# Patient Record
Sex: Female | Born: 1945
Health system: Southern US, Community
[De-identification: ages and names within clinical notes are randomized; demographics above are authoritative.]

## PROBLEM LIST (undated history)

## (undated) DIAGNOSIS — F32A Depression, unspecified: Secondary | ICD-10-CM

## (undated) DIAGNOSIS — I10 Essential (primary) hypertension: Secondary | ICD-10-CM

## (undated) DIAGNOSIS — K222 Esophageal obstruction: Secondary | ICD-10-CM

## (undated) DIAGNOSIS — F329 Major depressive disorder, single episode, unspecified: Secondary | ICD-10-CM

## (undated) DIAGNOSIS — T7840XA Allergy, unspecified, initial encounter: Secondary | ICD-10-CM

## (undated) DIAGNOSIS — F419 Anxiety disorder, unspecified: Secondary | ICD-10-CM

## (undated) DIAGNOSIS — K219 Gastro-esophageal reflux disease without esophagitis: Secondary | ICD-10-CM

## (undated) DIAGNOSIS — K449 Diaphragmatic hernia without obstruction or gangrene: Secondary | ICD-10-CM

## (undated) DIAGNOSIS — H269 Unspecified cataract: Secondary | ICD-10-CM

## (undated) DIAGNOSIS — K2270 Barrett's esophagus without dysplasia: Secondary | ICD-10-CM

## (undated) DIAGNOSIS — M1990 Unspecified osteoarthritis, unspecified site: Secondary | ICD-10-CM

## (undated) DIAGNOSIS — Z5189 Encounter for other specified aftercare: Secondary | ICD-10-CM

## (undated) DIAGNOSIS — IMO0002 Reserved for concepts with insufficient information to code with codable children: Secondary | ICD-10-CM

## (undated) DIAGNOSIS — J189 Pneumonia, unspecified organism: Secondary | ICD-10-CM

## (undated) DIAGNOSIS — M810 Age-related osteoporosis without current pathological fracture: Secondary | ICD-10-CM

## (undated) DIAGNOSIS — D689 Coagulation defect, unspecified: Secondary | ICD-10-CM

## (undated) DIAGNOSIS — K317 Polyp of stomach and duodenum: Secondary | ICD-10-CM

## (undated) DIAGNOSIS — C801 Malignant (primary) neoplasm, unspecified: Secondary | ICD-10-CM

## (undated) DIAGNOSIS — J45909 Unspecified asthma, uncomplicated: Secondary | ICD-10-CM

## (undated) DIAGNOSIS — G4730 Sleep apnea, unspecified: Secondary | ICD-10-CM

## (undated) DIAGNOSIS — R7611 Nonspecific reaction to tuberculin skin test without active tuberculosis: Secondary | ICD-10-CM

## (undated) DIAGNOSIS — K5790 Diverticulosis of intestine, part unspecified, without perforation or abscess without bleeding: Secondary | ICD-10-CM

## (undated) DIAGNOSIS — G4733 Obstructive sleep apnea (adult) (pediatric): Secondary | ICD-10-CM

## (undated) HISTORY — DX: Unspecified osteoarthritis, unspecified site: M19.90

## (undated) HISTORY — PX: FOOT NEUROMA SURGERY: SHX646

## (undated) HISTORY — DX: Nonspecific reaction to tuberculin skin test without active tuberculosis: R76.11

## (undated) HISTORY — DX: Allergy, unspecified, initial encounter: T78.40XA

## (undated) HISTORY — PX: WRIST FRACTURE SURGERY: SHX121

## (undated) HISTORY — PX: FRACTURE SURGERY: SHX138

## (undated) HISTORY — DX: Gastro-esophageal reflux disease without esophagitis: K21.9

## (undated) HISTORY — DX: Major depressive disorder, single episode, unspecified: F32.9

## (undated) HISTORY — DX: Anxiety disorder, unspecified: F41.9

## (undated) HISTORY — DX: Coagulation defect, unspecified: D68.9

## (undated) HISTORY — DX: Depression, unspecified: F32.A

## (undated) HISTORY — DX: Unspecified cataract: H26.9

## (undated) HISTORY — DX: Barrett's esophagus without dysplasia: K22.70

## (undated) HISTORY — PX: UPPER GASTROINTESTINAL ENDOSCOPY: SHX188

## (undated) HISTORY — DX: Obstructive sleep apnea (adult) (pediatric): G47.33

## (undated) HISTORY — PX: COLONOSCOPY: SHX174

## (undated) HISTORY — PX: CHOLECYSTECTOMY: SHX55

## (undated) HISTORY — DX: Sleep apnea, unspecified: G47.30

## (undated) HISTORY — DX: Unspecified asthma, uncomplicated: J45.909

## (undated) HISTORY — PX: FOOT FRACTURE SURGERY: SHX645

## (undated) HISTORY — DX: Reserved for concepts with insufficient information to code with codable children: IMO0002

## (undated) HISTORY — DX: Diaphragmatic hernia without obstruction or gangrene: K44.9

## (undated) HISTORY — DX: Age-related osteoporosis without current pathological fracture: M81.0

## (undated) HISTORY — DX: Diverticulosis of intestine, part unspecified, without perforation or abscess without bleeding: K57.90

## (undated) HISTORY — DX: Polyp of stomach and duodenum: K31.7

## (undated) HISTORY — PX: FINGER SURGERY: SHX640

## (undated) HISTORY — PX: EYE SURGERY: SHX253

## (undated) HISTORY — DX: Esophageal obstruction: K22.2

## (undated) HISTORY — PX: TONSILLECTOMY: SUR1361

## (undated) HISTORY — DX: Encounter for other specified aftercare: Z51.89

## (undated) HISTORY — DX: Morbid (severe) obesity due to excess calories: E66.01

## (undated) HISTORY — PX: HERNIA REPAIR: SHX51

## (undated) HISTORY — DX: Essential (primary) hypertension: I10

---

## 1979-08-25 HISTORY — PX: TUBAL LIGATION: SHX77

## 1998-04-26 ENCOUNTER — Ambulatory Visit (HOSPITAL_BASED_OUTPATIENT_CLINIC_OR_DEPARTMENT_OTHER): Admission: RE | Admit: 1998-04-26 | Discharge: 1998-04-26 | Payer: Self-pay | Admitting: Podiatry

## 1998-07-16 ENCOUNTER — Ambulatory Visit (HOSPITAL_COMMUNITY): Admission: RE | Admit: 1998-07-16 | Discharge: 1998-07-16 | Payer: Self-pay | Admitting: Gynecology

## 1998-07-16 ENCOUNTER — Encounter: Payer: Self-pay | Admitting: Gynecology

## 1998-07-26 ENCOUNTER — Ambulatory Visit (HOSPITAL_BASED_OUTPATIENT_CLINIC_OR_DEPARTMENT_OTHER): Admission: RE | Admit: 1998-07-26 | Discharge: 1998-07-26 | Payer: Self-pay | Admitting: Orthopedic Surgery

## 1999-03-31 ENCOUNTER — Ambulatory Visit (HOSPITAL_COMMUNITY): Admission: RE | Admit: 1999-03-31 | Discharge: 1999-03-31 | Payer: Self-pay | Admitting: Internal Medicine

## 1999-07-01 ENCOUNTER — Other Ambulatory Visit: Admission: RE | Admit: 1999-07-01 | Discharge: 1999-07-01 | Payer: Self-pay | Admitting: Obstetrics & Gynecology

## 1999-07-30 ENCOUNTER — Ambulatory Visit (HOSPITAL_COMMUNITY): Admission: RE | Admit: 1999-07-30 | Discharge: 1999-07-30 | Payer: Self-pay | Admitting: Obstetrics & Gynecology

## 1999-07-30 ENCOUNTER — Encounter: Payer: Self-pay | Admitting: Obstetrics & Gynecology

## 1999-09-15 ENCOUNTER — Encounter: Payer: Self-pay | Admitting: Obstetrics & Gynecology

## 1999-09-15 ENCOUNTER — Encounter: Admission: RE | Admit: 1999-09-15 | Discharge: 1999-09-15 | Payer: Self-pay | Admitting: Obstetrics & Gynecology

## 2000-08-04 ENCOUNTER — Ambulatory Visit (HOSPITAL_COMMUNITY): Admission: RE | Admit: 2000-08-04 | Discharge: 2000-08-04 | Payer: Self-pay | Admitting: Obstetrics & Gynecology

## 2000-08-04 ENCOUNTER — Encounter: Payer: Self-pay | Admitting: Obstetrics & Gynecology

## 2000-11-16 ENCOUNTER — Encounter: Admission: RE | Admit: 2000-11-16 | Discharge: 2000-11-16 | Payer: Self-pay | Admitting: Occupational Medicine

## 2000-11-16 ENCOUNTER — Encounter: Payer: Self-pay | Admitting: Occupational Medicine

## 2000-11-22 ENCOUNTER — Encounter: Admission: RE | Admit: 2000-11-22 | Discharge: 2000-11-22 | Payer: Self-pay | Admitting: Occupational Medicine

## 2000-11-22 ENCOUNTER — Encounter: Payer: Self-pay | Admitting: Occupational Medicine

## 2001-06-29 ENCOUNTER — Other Ambulatory Visit: Admission: RE | Admit: 2001-06-29 | Discharge: 2001-06-29 | Payer: Self-pay | Admitting: Obstetrics & Gynecology

## 2001-08-10 ENCOUNTER — Ambulatory Visit (HOSPITAL_COMMUNITY): Admission: RE | Admit: 2001-08-10 | Discharge: 2001-08-10 | Payer: Self-pay | Admitting: Obstetrics & Gynecology

## 2001-08-10 ENCOUNTER — Encounter: Payer: Self-pay | Admitting: Obstetrics & Gynecology

## 2002-03-31 ENCOUNTER — Ambulatory Visit (HOSPITAL_BASED_OUTPATIENT_CLINIC_OR_DEPARTMENT_OTHER): Admission: RE | Admit: 2002-03-31 | Discharge: 2002-03-31 | Payer: Self-pay | Admitting: Internal Medicine

## 2002-08-30 ENCOUNTER — Ambulatory Visit (HOSPITAL_COMMUNITY): Admission: RE | Admit: 2002-08-30 | Discharge: 2002-08-30 | Payer: Self-pay | Admitting: Obstetrics & Gynecology

## 2002-08-30 ENCOUNTER — Encounter: Payer: Self-pay | Admitting: Obstetrics & Gynecology

## 2002-09-08 ENCOUNTER — Ambulatory Visit (HOSPITAL_BASED_OUTPATIENT_CLINIC_OR_DEPARTMENT_OTHER): Admission: RE | Admit: 2002-09-08 | Discharge: 2002-09-08 | Payer: Self-pay | Admitting: Pulmonary Disease

## 2002-09-12 ENCOUNTER — Other Ambulatory Visit: Admission: RE | Admit: 2002-09-12 | Discharge: 2002-09-12 | Payer: Self-pay | Admitting: Obstetrics & Gynecology

## 2002-11-14 ENCOUNTER — Encounter: Payer: Self-pay | Admitting: Gastroenterology

## 2002-11-14 ENCOUNTER — Ambulatory Visit (HOSPITAL_COMMUNITY): Admission: RE | Admit: 2002-11-14 | Discharge: 2002-11-14 | Payer: Self-pay | Admitting: Gastroenterology

## 2002-11-14 ENCOUNTER — Encounter (INDEPENDENT_AMBULATORY_CARE_PROVIDER_SITE_OTHER): Payer: Self-pay | Admitting: Specialist

## 2003-09-24 ENCOUNTER — Encounter: Admission: RE | Admit: 2003-09-24 | Discharge: 2003-09-24 | Payer: Self-pay | Admitting: Obstetrics & Gynecology

## 2003-12-20 ENCOUNTER — Encounter: Payer: Self-pay | Admitting: Gastroenterology

## 2004-01-25 ENCOUNTER — Other Ambulatory Visit: Admission: RE | Admit: 2004-01-25 | Discharge: 2004-01-25 | Payer: Self-pay | Admitting: Obstetrics & Gynecology

## 2004-02-14 ENCOUNTER — Ambulatory Visit (HOSPITAL_BASED_OUTPATIENT_CLINIC_OR_DEPARTMENT_OTHER): Admission: RE | Admit: 2004-02-14 | Discharge: 2004-02-14 | Payer: Self-pay | Admitting: Surgery

## 2004-07-01 ENCOUNTER — Ambulatory Visit: Payer: Self-pay | Admitting: Psychology

## 2004-07-22 ENCOUNTER — Ambulatory Visit: Payer: Self-pay | Admitting: Psychology

## 2004-08-21 ENCOUNTER — Ambulatory Visit: Payer: Self-pay | Admitting: Psychology

## 2004-09-16 ENCOUNTER — Ambulatory Visit: Payer: Self-pay | Admitting: Gastroenterology

## 2005-03-10 ENCOUNTER — Ambulatory Visit: Payer: Self-pay | Admitting: Pulmonary Disease

## 2005-03-10 ENCOUNTER — Encounter: Admission: RE | Admit: 2005-03-10 | Discharge: 2005-03-10 | Payer: Self-pay | Admitting: Obstetrics & Gynecology

## 2005-04-15 ENCOUNTER — Ambulatory Visit: Payer: Self-pay | Admitting: Gastroenterology

## 2005-05-11 ENCOUNTER — Ambulatory Visit: Payer: Self-pay | Admitting: Gastroenterology

## 2005-05-24 DIAGNOSIS — K2270 Barrett's esophagus without dysplasia: Secondary | ICD-10-CM

## 2005-05-24 HISTORY — DX: Barrett's esophagus without dysplasia: K22.70

## 2005-05-25 ENCOUNTER — Ambulatory Visit: Payer: Self-pay | Admitting: Gastroenterology

## 2005-05-25 ENCOUNTER — Encounter (INDEPENDENT_AMBULATORY_CARE_PROVIDER_SITE_OTHER): Payer: Self-pay | Admitting: *Deleted

## 2005-06-02 ENCOUNTER — Ambulatory Visit: Payer: Self-pay | Admitting: Pulmonary Disease

## 2005-06-03 ENCOUNTER — Ambulatory Visit: Payer: Self-pay | Admitting: Psychology

## 2005-06-24 ENCOUNTER — Ambulatory Visit: Payer: Self-pay | Admitting: Psychology

## 2005-06-30 ENCOUNTER — Ambulatory Visit: Payer: Self-pay | Admitting: Gastroenterology

## 2005-07-21 ENCOUNTER — Ambulatory Visit: Payer: Self-pay | Admitting: Psychology

## 2005-08-04 ENCOUNTER — Ambulatory Visit: Payer: Self-pay | Admitting: Psychology

## 2005-08-31 ENCOUNTER — Ambulatory Visit: Payer: Self-pay | Admitting: Psychology

## 2005-09-15 ENCOUNTER — Ambulatory Visit: Payer: Self-pay | Admitting: Psychology

## 2005-12-22 ENCOUNTER — Ambulatory Visit: Payer: Self-pay | Admitting: Psychology

## 2006-04-08 ENCOUNTER — Encounter: Admission: RE | Admit: 2006-04-08 | Discharge: 2006-04-08 | Payer: Self-pay | Admitting: Obstetrics & Gynecology

## 2006-04-21 ENCOUNTER — Ambulatory Visit: Payer: Self-pay | Admitting: Internal Medicine

## 2006-05-19 ENCOUNTER — Ambulatory Visit: Payer: Self-pay | Admitting: Internal Medicine

## 2006-07-27 ENCOUNTER — Ambulatory Visit: Payer: Self-pay | Admitting: Internal Medicine

## 2006-09-27 ENCOUNTER — Ambulatory Visit: Payer: Self-pay | Admitting: Internal Medicine

## 2006-10-12 ENCOUNTER — Ambulatory Visit: Payer: Self-pay | Admitting: Psychology

## 2006-10-28 ENCOUNTER — Ambulatory Visit: Payer: Self-pay | Admitting: Internal Medicine

## 2006-11-01 ENCOUNTER — Ambulatory Visit: Payer: Self-pay | Admitting: Internal Medicine

## 2006-11-16 ENCOUNTER — Ambulatory Visit: Payer: Self-pay | Admitting: Internal Medicine

## 2006-12-08 ENCOUNTER — Ambulatory Visit: Payer: Self-pay | Admitting: Internal Medicine

## 2007-01-24 ENCOUNTER — Ambulatory Visit: Payer: Self-pay | Admitting: Internal Medicine

## 2007-01-28 ENCOUNTER — Ambulatory Visit: Payer: Self-pay | Admitting: Gastroenterology

## 2007-02-11 ENCOUNTER — Ambulatory Visit: Payer: Self-pay | Admitting: Gastroenterology

## 2007-02-11 ENCOUNTER — Encounter: Payer: Self-pay | Admitting: Gastroenterology

## 2007-05-16 ENCOUNTER — Ambulatory Visit: Payer: Self-pay | Admitting: Internal Medicine

## 2007-05-16 LAB — CONVERTED CEMR LAB
ALT: 27 units/L (ref 0–35)
Alkaline Phosphatase: 60 units/L (ref 39–117)
Calcium: 9.4 mg/dL (ref 8.4–10.5)
Eosinophils Absolute: 0.2 10*3/uL (ref 0.0–0.6)
Eosinophils Relative: 2.4 % (ref 0.0–5.0)
Glucose, Bld: 98 mg/dL (ref 70–99)
Lipase: 17 units/L (ref 11.0–59.0)
MCV: 89.6 fL (ref 78.0–100.0)
Platelets: 326 10*3/uL (ref 150–400)
Potassium: 4.1 meq/L (ref 3.5–5.1)
RBC: 4.55 M/uL (ref 3.87–5.11)
RDW: 12.7 % (ref 11.5–14.6)
Sodium: 145 meq/L (ref 135–145)
Total Protein: 6.8 g/dL (ref 6.0–8.3)
WBC: 7.6 10*3/uL (ref 4.5–10.5)

## 2007-09-16 ENCOUNTER — Telehealth (INDEPENDENT_AMBULATORY_CARE_PROVIDER_SITE_OTHER): Payer: Self-pay | Admitting: *Deleted

## 2007-10-14 ENCOUNTER — Encounter: Payer: Self-pay | Admitting: Internal Medicine

## 2007-11-03 DIAGNOSIS — K219 Gastro-esophageal reflux disease without esophagitis: Secondary | ICD-10-CM

## 2007-11-03 DIAGNOSIS — R05 Cough: Secondary | ICD-10-CM

## 2007-11-03 DIAGNOSIS — G4733 Obstructive sleep apnea (adult) (pediatric): Secondary | ICD-10-CM

## 2007-11-04 ENCOUNTER — Ambulatory Visit: Payer: Self-pay | Admitting: Internal Medicine

## 2007-11-11 LAB — CONVERTED CEMR LAB
Albumin: 4 g/dL (ref 3.5–5.2)
Basophils Absolute: 0.1 10*3/uL (ref 0.0–0.1)
Eosinophils Absolute: 0.2 10*3/uL (ref 0.0–0.6)
GFR calc non Af Amer: 109 mL/min
HCT: 40.8 % (ref 36.0–46.0)
Hemoglobin: 13.7 g/dL (ref 12.0–15.0)
MCHC: 33.4 g/dL (ref 30.0–36.0)
MCV: 90.4 fL (ref 78.0–100.0)
Monocytes Absolute: 0.8 10*3/uL — ABNORMAL HIGH (ref 0.2–0.7)
Neutro Abs: 4.1 10*3/uL (ref 1.4–7.7)
Neutrophils Relative %: 51.2 % (ref 43.0–77.0)
Potassium: 3.9 meq/L (ref 3.5–5.1)
Sodium: 141 meq/L (ref 135–145)
TSH: 1.81 microintl units/mL (ref 0.35–5.50)
Total Bilirubin: 0.6 mg/dL (ref 0.3–1.2)

## 2007-11-16 ENCOUNTER — Ambulatory Visit: Payer: Self-pay | Admitting: Pulmonary Disease

## 2007-12-01 ENCOUNTER — Ambulatory Visit (HOSPITAL_BASED_OUTPATIENT_CLINIC_OR_DEPARTMENT_OTHER): Admission: RE | Admit: 2007-12-01 | Discharge: 2007-12-01 | Payer: Self-pay | Admitting: Pulmonary Disease

## 2007-12-01 ENCOUNTER — Ambulatory Visit: Payer: Self-pay | Admitting: Pulmonary Disease

## 2008-01-22 ENCOUNTER — Encounter: Payer: Self-pay | Admitting: Pulmonary Disease

## 2008-01-23 ENCOUNTER — Telehealth (INDEPENDENT_AMBULATORY_CARE_PROVIDER_SITE_OTHER): Payer: Self-pay | Admitting: *Deleted

## 2008-01-31 DIAGNOSIS — J310 Chronic rhinitis: Secondary | ICD-10-CM | POA: Insufficient documentation

## 2008-01-31 DIAGNOSIS — K449 Diaphragmatic hernia without obstruction or gangrene: Secondary | ICD-10-CM | POA: Insufficient documentation

## 2008-02-01 ENCOUNTER — Ambulatory Visit: Payer: Self-pay | Admitting: Gastroenterology

## 2008-02-01 ENCOUNTER — Ambulatory Visit: Payer: Self-pay | Admitting: Pulmonary Disease

## 2008-02-01 DIAGNOSIS — K5730 Diverticulosis of large intestine without perforation or abscess without bleeding: Secondary | ICD-10-CM | POA: Insufficient documentation

## 2008-04-04 ENCOUNTER — Ambulatory Visit: Payer: Self-pay | Admitting: Internal Medicine

## 2008-04-04 DIAGNOSIS — R1310 Dysphagia, unspecified: Secondary | ICD-10-CM | POA: Insufficient documentation

## 2008-08-01 ENCOUNTER — Telehealth: Payer: Self-pay | Admitting: Internal Medicine

## 2008-08-03 ENCOUNTER — Ambulatory Visit: Payer: Self-pay | Admitting: Internal Medicine

## 2008-08-03 DIAGNOSIS — E7800 Pure hypercholesterolemia, unspecified: Secondary | ICD-10-CM

## 2008-08-04 LAB — CONVERTED CEMR LAB
Basophils Absolute: 0.1 10*3/uL (ref 0.0–0.1)
Basophils Relative: 0.7 % (ref 0.0–3.0)
CRP, High Sensitivity: 8 — ABNORMAL HIGH (ref 0.00–5.00)
Calcium: 9 mg/dL (ref 8.4–10.5)
Chloride: 104 meq/L (ref 96–112)
Cholesterol: 193 mg/dL (ref 0–200)
Creatinine, Ser: 0.6 mg/dL (ref 0.4–1.2)
Eosinophils Absolute: 0.2 10*3/uL (ref 0.0–0.7)
GFR calc Af Amer: 131 mL/min
GFR calc non Af Amer: 108 mL/min
HCT: 39.9 % (ref 36.0–46.0)
HDL: 34.3 mg/dL — ABNORMAL LOW (ref >39.0)
MCHC: 34.9 g/dL (ref 30.0–36.0)
MCV: 89.5 fL (ref 78.0–100.0)
Monocytes Absolute: 0.6 10*3/uL (ref 0.1–1.0)
Neutrophils Relative %: 65.8 % (ref 43.0–77.0)
RBC: 4.46 M/uL (ref 3.87–5.11)
VLDL: 17 mg/dL (ref 0–40)

## 2008-08-09 ENCOUNTER — Telehealth (INDEPENDENT_AMBULATORY_CARE_PROVIDER_SITE_OTHER): Payer: Self-pay | Admitting: *Deleted

## 2008-08-21 ENCOUNTER — Telehealth (INDEPENDENT_AMBULATORY_CARE_PROVIDER_SITE_OTHER): Payer: Self-pay | Admitting: *Deleted

## 2008-08-31 ENCOUNTER — Telehealth (INDEPENDENT_AMBULATORY_CARE_PROVIDER_SITE_OTHER): Payer: Self-pay | Admitting: *Deleted

## 2008-09-06 ENCOUNTER — Encounter: Payer: Self-pay | Admitting: Internal Medicine

## 2008-09-12 ENCOUNTER — Telehealth (INDEPENDENT_AMBULATORY_CARE_PROVIDER_SITE_OTHER): Payer: Self-pay | Admitting: *Deleted

## 2008-09-17 ENCOUNTER — Ambulatory Visit: Payer: Self-pay | Admitting: Internal Medicine

## 2008-10-02 ENCOUNTER — Ambulatory Visit: Payer: Self-pay | Admitting: Internal Medicine

## 2008-10-29 ENCOUNTER — Telehealth (INDEPENDENT_AMBULATORY_CARE_PROVIDER_SITE_OTHER): Payer: Self-pay | Admitting: *Deleted

## 2008-11-01 ENCOUNTER — Encounter: Payer: Self-pay | Admitting: Internal Medicine

## 2008-11-03 ENCOUNTER — Emergency Department (HOSPITAL_COMMUNITY): Admission: EM | Admit: 2008-11-03 | Discharge: 2008-11-04 | Payer: Self-pay | Admitting: Emergency Medicine

## 2008-11-19 ENCOUNTER — Telehealth: Payer: Self-pay | Admitting: Internal Medicine

## 2008-12-17 ENCOUNTER — Telehealth (INDEPENDENT_AMBULATORY_CARE_PROVIDER_SITE_OTHER): Payer: Self-pay | Admitting: *Deleted

## 2009-02-04 ENCOUNTER — Ambulatory Visit: Payer: Self-pay | Admitting: Gastroenterology

## 2009-02-04 DIAGNOSIS — K2270 Barrett's esophagus without dysplasia: Secondary | ICD-10-CM

## 2009-05-23 ENCOUNTER — Ambulatory Visit: Payer: Self-pay | Admitting: Internal Medicine

## 2009-05-23 LAB — CONVERTED CEMR LAB: Vit D, 25-Hydroxy: 42 ng/mL (ref 30–89)

## 2009-05-27 LAB — CONVERTED CEMR LAB
ALT: 37 units/L — ABNORMAL HIGH (ref 0–35)
AST: 29 units/L (ref 0–37)
Basophils Relative: 0.7 % (ref 0.0–3.0)
Bilirubin Urine: NEGATIVE
CRP, High Sensitivity: 10.2 — ABNORMAL HIGH (ref 0.00–5.00)
Chloride: 105 meq/L (ref 96–112)
Cholesterol: 154 mg/dL (ref 0–200)
Eosinophils Relative: 1.9 % (ref 0.0–5.0)
GFR calc non Af Amer: 90.31 mL/min (ref >60)
HCT: 39.8 % (ref 36.0–46.0)
Hemoglobin: 13.8 g/dL (ref 12.0–15.0)
LDL Cholesterol: 106 mg/dL — ABNORMAL HIGH (ref 0–99)
Leukocytes, UA: NEGATIVE
Lymphs Abs: 1.9 10*3/uL (ref 0.7–4.0)
MCV: 89.7 fL (ref 78.0–100.0)
Monocytes Absolute: 0.6 10*3/uL (ref 0.1–1.0)
Monocytes Relative: 8.2 % (ref 3.0–12.0)
Potassium: 3.4 meq/L — ABNORMAL LOW (ref 3.5–5.1)
RBC: 4.44 M/uL (ref 3.87–5.11)
Sodium: 141 meq/L (ref 135–145)
Specific Gravity, Urine: >=1.03 (ref 1.000–1.030)
TSH: 0.95 microintl units/mL (ref 0.35–5.50)
Total Bilirubin: 0.4 mg/dL (ref 0.3–1.2)
Total CHOL/HDL Ratio: 4
Total Protein: 6.7 g/dL (ref 6.0–8.3)
Urine Glucose: NEGATIVE mg/dL
VLDL: 12 mg/dL (ref 0.0–40.0)
WBC: 7.9 10*3/uL (ref 4.5–10.5)
pH: 5.5 (ref 5.0–8.0)

## 2009-06-11 ENCOUNTER — Telehealth (INDEPENDENT_AMBULATORY_CARE_PROVIDER_SITE_OTHER): Payer: Self-pay | Admitting: *Deleted

## 2009-07-11 ENCOUNTER — Ambulatory Visit: Payer: Self-pay | Admitting: Internal Medicine

## 2009-08-03 ENCOUNTER — Emergency Department (HOSPITAL_COMMUNITY): Admission: EM | Admit: 2009-08-03 | Discharge: 2009-08-03 | Payer: Self-pay | Admitting: Emergency Medicine

## 2009-08-20 ENCOUNTER — Ambulatory Visit: Payer: Self-pay | Admitting: Internal Medicine

## 2009-09-05 ENCOUNTER — Telehealth (INDEPENDENT_AMBULATORY_CARE_PROVIDER_SITE_OTHER): Payer: Self-pay | Admitting: *Deleted

## 2009-09-06 ENCOUNTER — Encounter: Payer: Self-pay | Admitting: Internal Medicine

## 2009-09-10 ENCOUNTER — Encounter: Payer: Self-pay | Admitting: Adult Health

## 2009-09-10 ENCOUNTER — Ambulatory Visit: Payer: Self-pay | Admitting: Internal Medicine

## 2009-11-05 ENCOUNTER — Ambulatory Visit: Payer: Self-pay | Admitting: Internal Medicine

## 2009-11-19 ENCOUNTER — Encounter: Payer: Self-pay | Admitting: Pulmonary Disease

## 2010-01-07 ENCOUNTER — Encounter (INDEPENDENT_AMBULATORY_CARE_PROVIDER_SITE_OTHER): Payer: Self-pay | Admitting: *Deleted

## 2010-03-06 ENCOUNTER — Encounter (INDEPENDENT_AMBULATORY_CARE_PROVIDER_SITE_OTHER): Payer: Self-pay | Admitting: *Deleted

## 2010-03-10 ENCOUNTER — Ambulatory Visit: Payer: Self-pay | Admitting: Gastroenterology

## 2010-03-12 ENCOUNTER — Telehealth (INDEPENDENT_AMBULATORY_CARE_PROVIDER_SITE_OTHER): Payer: Self-pay | Admitting: *Deleted

## 2010-03-12 ENCOUNTER — Encounter: Payer: Self-pay | Admitting: Gastroenterology

## 2010-04-15 ENCOUNTER — Telehealth: Payer: Self-pay | Admitting: Gastroenterology

## 2010-04-18 ENCOUNTER — Ambulatory Visit: Payer: Self-pay | Admitting: Gastroenterology

## 2010-04-25 ENCOUNTER — Encounter: Payer: Self-pay | Admitting: Gastroenterology

## 2010-04-29 ENCOUNTER — Ambulatory Visit: Payer: Self-pay | Admitting: Pulmonary Disease

## 2010-04-29 DIAGNOSIS — G4710 Hypersomnia, unspecified: Secondary | ICD-10-CM

## 2010-05-26 ENCOUNTER — Encounter: Payer: Self-pay | Admitting: Pulmonary Disease

## 2010-06-03 ENCOUNTER — Ambulatory Visit: Payer: Self-pay | Admitting: Internal Medicine

## 2010-06-03 ENCOUNTER — Encounter: Payer: Self-pay | Admitting: Internal Medicine

## 2010-06-03 DIAGNOSIS — E559 Vitamin D deficiency, unspecified: Secondary | ICD-10-CM

## 2010-06-03 LAB — CONVERTED CEMR LAB
ALT: 23 units/L (ref 0–35)
BUN: 14 mg/dL (ref 6–23)
Basophils Absolute: 0.1 10*3/uL (ref 0.0–0.1)
Bilirubin Urine: NEGATIVE
Bilirubin, Direct: 0.1 mg/dL (ref 0.0–0.3)
CO2: 33 meq/L — ABNORMAL HIGH (ref 19–32)
Chloride: 103 meq/L (ref 96–112)
Cholesterol: 197 mg/dL (ref 0–200)
Creatinine, Ser: 0.7 mg/dL (ref 0.4–1.2)
Eosinophils Absolute: 0.2 10*3/uL (ref 0.0–0.7)
Eosinophils Relative: 2.6 % (ref 0.0–5.0)
Glucose, Bld: 93 mg/dL (ref 70–99)
HCT: 38.6 % (ref 36.0–46.0)
LDL Cholesterol: 136 mg/dL — ABNORMAL HIGH (ref 0–99)
Leukocytes, UA: NEGATIVE
Lymphs Abs: 2.2 10*3/uL (ref 0.7–4.0)
MCHC: 34.3 g/dL (ref 30.0–36.0)
MCV: 90.5 fL (ref 78.0–100.0)
Monocytes Absolute: 0.5 10*3/uL (ref 0.1–1.0)
Neutrophils Relative %: 59.9 % (ref 43.0–77.0)
Nitrite: NEGATIVE
Platelets: 308 10*3/uL (ref 150.0–400.0)
Potassium: 4.4 meq/L (ref 3.5–5.1)
RDW: 13.4 % (ref 11.5–14.6)
TSH: 1.17 microintl units/mL (ref 0.35–5.50)
Total Bilirubin: 0.5 mg/dL (ref 0.3–1.2)
Triglycerides: 104 mg/dL (ref 0.0–149.0)
pH: 7 (ref 5.0–8.0)

## 2010-06-05 ENCOUNTER — Telehealth: Payer: Self-pay | Admitting: Internal Medicine

## 2010-06-09 LAB — CONVERTED CEMR LAB: Vit D, 25-Hydroxy: 22 ng/mL — ABNORMAL LOW (ref 30–89)

## 2010-06-30 ENCOUNTER — Encounter: Payer: Self-pay | Admitting: Internal Medicine

## 2010-07-09 ENCOUNTER — Ambulatory Visit: Payer: Self-pay | Admitting: Psychology

## 2010-07-14 ENCOUNTER — Telehealth: Payer: Self-pay | Admitting: Pulmonary Disease

## 2010-08-04 ENCOUNTER — Ambulatory Visit: Payer: Self-pay | Admitting: Psychology

## 2010-08-04 ENCOUNTER — Ambulatory Visit: Payer: Self-pay | Admitting: Internal Medicine

## 2010-08-05 ENCOUNTER — Telehealth (INDEPENDENT_AMBULATORY_CARE_PROVIDER_SITE_OTHER): Payer: Self-pay | Admitting: *Deleted

## 2010-08-07 ENCOUNTER — Telehealth: Payer: Self-pay | Admitting: Adult Health

## 2010-08-07 DIAGNOSIS — S335XXA Sprain of ligaments of lumbar spine, initial encounter: Secondary | ICD-10-CM

## 2010-08-19 ENCOUNTER — Ambulatory Visit: Payer: Self-pay | Admitting: Psychology

## 2010-09-03 ENCOUNTER — Telehealth (INDEPENDENT_AMBULATORY_CARE_PROVIDER_SITE_OTHER): Payer: Self-pay | Admitting: *Deleted

## 2010-09-03 ENCOUNTER — Encounter: Payer: Self-pay | Admitting: Internal Medicine

## 2010-09-04 ENCOUNTER — Encounter: Payer: Self-pay | Admitting: Internal Medicine

## 2010-09-10 ENCOUNTER — Ambulatory Visit
Admission: RE | Admit: 2010-09-10 | Discharge: 2010-09-10 | Payer: Self-pay | Source: Home / Self Care | Attending: Pulmonary Disease | Admitting: Pulmonary Disease

## 2010-09-16 ENCOUNTER — Telehealth (INDEPENDENT_AMBULATORY_CARE_PROVIDER_SITE_OTHER): Payer: Self-pay | Admitting: *Deleted

## 2010-09-23 NOTE — Assessment & Plan Note (Signed)
Summary: per pt call/cb   Primary Provider/Referring Provider:  Dorthey Sawyer  CC:  OSA patient that was last seen in 2009.Marland KitchenMarland Kitchenpatient says she puts the cpap on every night but wakes up in the middle of the night and it is off...she has dozed off twice while driving...sleepy and tired during the day.  History of Present Illness: 65 yo female with severe OSA using BPAP 14/9.  I last saw Ms. Souffrant in 2009.  She has been getting more trouble feeling sleepy during the day.  She has almost fallen asleep a couple of times while driving.  She is falling asleep when sitting quiet at home.  This has been getting worse for the past one or two months.  She goes to bed at 11pm after putting her BPAP mask on.  She has no trouble falling asleep.  She sleeps through the night until about 530 am on weekdays.  She will get up at 8am on weekends.  When she wakes up her BPAP mask will be off.  She is not sure how this happens.  She is using a nasal mask.  She had tried using nasal pillows also.  She will get dryness in her mouth, and sometimes this can be severe.  She does not have a chin strap.  She has been getting sinus congestion, but has been able to continue to use her BPAP.  She feels like the pressure from the machine is okay.  She has not had any recent changes in her medications.  She denies sleep walking, sleep talking, nightmares, sleep hallucinations, sleep paralysis, cataplexy, or restless legs.  She is not using anything to help her sleep or stay awake.  Her mood is okay.  Her weight fluctuates, but has been relatively stable.    Current Medications (verified): 1)  Cymbalta 60 Mg Cpep (Duloxetine Hcl) .[name].. 1 By Mouth Daily 2)  Pantoprazole Sodium 40 Mg  Tbec (Pantoprazole Sodium) .... Take One 30-60 Min Before First and Last Meals of The Day 3)  Pepcid Ac Maximum Strength 20 Mg Tabs (Famotidine) .... One At Bedtime 4)  B Complex  Tabs (B Complex Vitamins) .... Once Daily 5)  Omnaris 50  Mcg/act Susp (Ciclesonide) .... Apply Twice Daily 6)  Px Fiber 0.52 Gm Caps (Psyllium) .... Take 2 Capsules Two Times A Day 7)  Caltrate 600+d 600-400 Mg-Unit  Tabs (Calcium Carbonate-Vitamin D) .... Take 1 Tablet By Mouth Once A Day 8)  Vitamin C 500 Mg Tabs (Ascorbic Acid) .[name].. 1 Once Daily 9)  Tussin Cough 15 Mg Caps (Dextromethorphan Hbr) .... Use 2 Caps By Mouth Every 6 Hours As Needed 10)  Tramadol Hcl 50 Mg  Tabs (Tramadol Hcl) .... One To Two By Mouth Every 4-6 Hours As Needed For Pain or Cough 11)  Chlor-Trimeton 4 Mg Tabs (Chlorpheniramine Maleate) .... One Every 6 Hours As Needed For Congestion or Drainage 12)  Ibuprofen 200 Mg Tabs (Ibuprofen) .... As Directed On Bottle As Needed 13)  Advil Cold & Sinus Liqui-Gels 30-200 Mg Caps (Pseudoephedrine-Ibuprofen) .... As Directed As Needed 14)  Afrin Nasal Spray 0.05 % Soln (Oxymetazoline Hcl) .... As Directed As Needed 15)  Saline Nasal Spray 0.65 % Soln (Saline) .... Use 2 Puffs Every 4 Hours As Needed 16)  Xanax 0.25 Mg Tabs (Alprazolam) .... 1/2 Once Daily As Needed 17)  Pantoprazole Sodium 40 Mg  Tbec (Pantoprazole Sodium) .[name].. 1 Twice A Day 30 Minutes Before Meals  Allergies (verified): 1)  Epinephrine (Epinephrine)  Past  History:  Past Surgical History: Last updated: 11/05/2009 Csection x two Cholecystectomy-1985 Right foot fracture Morton neuroma left foot Tubal Ligation 1981 Right arm surgery Dec 15th 2010  Past Medical History: HIATAL HERNIA (ICD-553.3) RHINITIS, CHRONIC (ICD-472.0)     - Remote allergy testing = molds/dust only     - Sinus CT 09/06/08  mild thickending only OBSTRUCTIVE SLEEP APNEA (ICD-327.23)..................................................[name] --PSG 03/31/02 AHI 66 --Nocturnal BIPAP MORBID OBESITY (ICD-278.01)     - Target wt  =  158  for BMI < 30  POSITIVE PPD (ICD-795.5) COUGH (ICD-786.2) GERD (ICD-530.81) Barretts esophagus Esophageal Stricture EROSIVE GASTRITIS BENIGN GASTRIC FUNDIC  GLAND POLYPS DIVERTICULOSIS HEALTH MAINTENANCE........................................................Marland KitchenWert    - DT May 23, 2009     - Pneumovax 9/07    - CPX May 23, 2009  Complex med regimen -------Meds reviewed with pt education and computerized med calendar completed September 10, 2009   Vital Signs:  Patient profile:   65 year old female Height:      62 inches (157.48 cm) Weight:      234 pounds (106.36 kg) BMI:     42.95 O2 Sat:      95 % on Room air Temp:     98.2 degrees F (36.78 degrees C) oral Pulse rate:   73 / minute BP sitting:   120 / 78  (left arm) Cuff size:   large  Vitals Entered By: Michel Bickers CMA (April 29, 2010 4:15 PM)  O2 Sat at Rest %:  95 O2 Flow:  Room air CC: OSA patient that was last seen in 2009.Marland KitchenMarland Kitchenpatient says she puts the cpap on every night but wakes up in the middle of the night and it is off...she has dozed off twice while driving...sleepy and tired during the day Comments Medications reviewed with the patient. Daytime phone verified. Michel Bickers [facility]  April 29, 2010 4:25 PM   Physical Exam  General:  normal appearance, healthy appearing, and obese.   Eyes:  PERRLA and EOMI.   Nose:  clear nasal discharge, no sinus tenderness Mouth:  MP 3, no exudate Neck:  no JVD.   Lungs:  clear bilaterally to auscultation and percussion Heart:  regular rate and rhythm, S1, S2 without murmurs, rubs, gallops, or clicks Abdomen:  soft, nontender Extremities:  no clubbing, cyanosis, edema, or deformity noted Neurologic:  normal CN II-XII and strength normal.   Cervical Nodes:  no significant adenopathy Psych:  alert and cooperative; normal mood and affect; normal attention span and concentration   Impression & Recommendations:  Problem # 1:  HYPERSOMNIA (ICD-780.54) This is likely multifactorial.    She has severe sleep apnea.  She has been using BPAP, but I am not sure if she is fully compliant.  She could have also had progression  in the severity of her sleep apnea, and may need adjustment in her pressure setting.  She is only sleeping for about 5 to 6 hours during the work week.  I suspect that this is not sufficient time for her to sleep.  I have advised her to try to sleep for at least 7 to 8 hours on a regular basis.  She is on several medications which can have sedating side effects (xanax, cymbalta, chlor-trimeton).  However, her use of these medications pre-dates the onset of her worsening hypersomnia.  I think it is okay to continue these medications for now, but this may need to be re-addressed if her symptoms persist.  Problem # 2:  OBSTRUCTIVE  SLEEP APNEA (ICD-327.23) Will get a copy of her BPAP download.  Depending on the results of this will determine if she needs to have a repeat in-lab titration study.  Problem # 3:  RHINITIS, CHRONIC (ICD-472.0) She has chronic sinus disease.  This has not been much of an issue so far with her ability to tolerate BPAP.  If it does become a problem down the road she may need to consider a different mask.  Problem # 4:  PERSISTENT DISORDER INIT/MAINTAINING WAKEFULNESS (ICD-307.44) I discussed with her proper sleep hygiene and need to allow for adequeate sleep time.  Medications Added to Medication List This Visit: 1)  Cymbalta 60 Mg Cpep (Duloxetine hcl) .[name].. 1 by mouth daily 2)  B Complex Tabs (B complex vitamins) .... Once daily  Complete Medication List: 1)  Cymbalta 60 Mg Cpep (Duloxetine hcl) .[name].. 1 by mouth daily 2)  Pantoprazole Sodium 40 Mg Tbec (Pantoprazole sodium) .... Take one 30-60 min before first and last meals of the day 3)  Pepcid Ac Maximum Strength 20 Mg Tabs (Famotidine) .... One at bedtime 4)  B Complex Tabs (B complex vitamins) .... Once daily 5)  Omnaris 50 Mcg/act Susp (Ciclesonide) .... Apply twice daily 6)  Px Fiber 0.52 Gm Caps (Psyllium) .... Take 2 capsules two times a day 7)  Caltrate 600+d 600-400 Mg-unit Tabs (Calcium carbonate-vitamin d)  .... Take 1 tablet by mouth once a day 8)  Vitamin C 500 Mg Tabs (Ascorbic acid) .[name].. 1 once daily 9)  Tussin Cough 15 Mg Caps (Dextromethorphan hbr) .... Use 2 caps by mouth every 6 hours as needed 10)  Tramadol Hcl 50 Mg Tabs (Tramadol hcl) .... One to two by mouth every 4-6 hours as needed for pain or cough 11)  Chlor-trimeton 4 Mg Tabs (Chlorpheniramine maleate) .... One every 6 hours as needed for congestion or drainage 12)  Ibuprofen 200 Mg Tabs (Ibuprofen) .... As directed on bottle as needed 13)  Advil Cold & Sinus Liqui-gels 30-200 Mg Caps (Pseudoephedrine-ibuprofen) .... As directed as needed 14)  Afrin Nasal Spray 0.05 % Soln (Oxymetazoline hcl) .... As directed as needed 15)  Saline Nasal Spray 0.65 % Soln (Saline) .... Use 2 puffs every 4 hours as needed 16)  Xanax 0.25 Mg Tabs (Alprazolam) .... 1/2 once daily as needed 17)  Pantoprazole Sodium 40 Mg Tbec (Pantoprazole sodium) .[name].. 1 twice a day 30 minutes before meals  Other Orders: Est. Patient Level IV (45409) DME Referral (DME)  Patient Instructions: 1)  Try to get at least 7 to 8 hours of sleep a night 2)  Will get copy of report from BPAP machine 3)  Follow up in 4 to 6 weeks

## 2010-09-23 NOTE — Assessment & Plan Note (Signed)
Summary: Primary svc/ cpx> refer to nutrition      Primary Provider/Referring Provider:  Dorthey Sawyer  CC:  cpx fasting.  History of Present Illness: 65 yowf never smoker  with morbid obesity complicated by gastroesophageal reflux disease, and also a history of obstructive sleep apnea.  April 05, 2008 ov:  new onset sensation something stuck in her throat with  no dysphagia or fever and  more of a tickle than a pain, resolved on short term pepcid at bedtime.  September 10, 2009--Returns for follow up and med review. Last visit flare in cough, Tx w/ steroids and aggressive GERD/Rhinitis prevention, and cough control. She is much improved w/ total resolution of cough.    June 03, 2010 cpx c/o more nasal congestion since september slt tickle in throat. Pt denies any significant sore throat, dysphagia, itching, sneezing,   excess or  secretions,  fever, chills, sweats, unintended wt loss, pleuritic or exertional cp, hempoptysis, change in activity tolerance  orthopnea pnd or leg swelling. Pt also denies any obvious fluctuation in symptoms with weather or environmental change or other alleviating or aggravating factors.       Current Medications (verified): 1)  Cymbalta 60 Mg Cpep (Duloxetine Hcl) .[name].. 1 By Mouth Daily 2)  Pantoprazole Sodium 40 Mg  Tbec (Pantoprazole Sodium) .... Take One 30-60 Min Before First and Last Meals of The Day 3)  Pepcid Ac Maximum Strength 20 Mg Tabs (Famotidine) .... One At Bedtime 4)  B Complex  Tabs (B Complex Vitamins) .... Once Daily 5)  Omnaris 50 Mcg/act Susp (Ciclesonide) .... Apply Twice Daily 6)  Px Fiber 0.52 Gm Caps (Psyllium) .... Take 2 Capsules Two Times A Day 7)  Caltrate 600+d 600-400 Mg-Unit  Tabs (Calcium Carbonate-Vitamin D) .... Take 1 Tablet By Mouth Once A Day 8)  Vitamin C 500 Mg Tabs (Ascorbic Acid) .[name].. 1 Once Daily 9)  Tussin Cough 15 Mg Caps (Dextromethorphan Hbr) .... Use 2 Caps By Mouth Every 6 Hours As Needed 10)  Tramadol Hcl  50 Mg  Tabs (Tramadol Hcl) .... One To Two By Mouth Every 4-6 Hours As Needed For Pain or Cough 11)  Chlor-Trimeton 4 Mg Tabs (Chlorpheniramine Maleate) .... One Every 6 Hours As Needed For Congestion or Drainage 12)  Ibuprofen 200 Mg Tabs (Ibuprofen) .... As Directed On Bottle As Needed 13)  Advil Cold & Sinus Liqui-Gels 30-200 Mg Caps (Pseudoephedrine-Ibuprofen) .... As Directed As Needed 14)  Afrin Nasal Spray 0.05 % Soln (Oxymetazoline Hcl) .... As Directed As Needed 15)  Saline Nasal Spray 0.65 % Soln (Saline) .... Use 2 Puffs Every 4 Hours As Needed 16)  Xanax 0.25 Mg Tabs (Alprazolam) .... 1/2 Once Daily As Needed  Allergies (verified): 1)  Epinephrine (Epinephrine)  Past History:  Past Medical History: HIATAL HERNIA (ICD-553.3) with GERD      - H/o Barretts esophagus       - EGD pos HH/ stricture 04/18/10 ,  neg barrett's RHINITIS, CHRONIC (ICD-472.0)     - Remote allergy testing = molds/dust only     - Sinus CT 09/06/08  mild thickending only OBSTRUCTIVE SLEEP APNEA (ICD-327.23)..................................................[name] --PSG 03/31/02 AHI 66 --Nocturnal BIPAP MORBID OBESITY (ICD-278.01)     - Target wt  =  158  for BMI < 30  POSITIVE PPD (ICD-795.5) COUGH (ICD-786.2) DIVERTICULOSIS HEALTH MAINTENANCE........................................................Marland KitchenWert    - DT May 23, 2009     - Pneumovax 9/07    - CPX June 03, 2010    - GYN  Levoix  Complex med regimen -------Meds reviewed with pt education and computerized med calendar completed September 10, 2009   Family History: Father - CAD, ? 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  Married.  2 Kids. Never smoker,    but Husband  smokes Illicit Drug Use - no Patient does not get regular exercise.  No etoh  Vital Signs:  Patient profile:   65 year old female Height:      62 inches Weight:      232.25 pounds O2 Sat:      93 % on Room air Temp:     98.0 degrees F oral Pulse rate:   69 / minute BP sitting:   158 / 80  (left arm) Cuff size:   large  Vitals Entered By: [name] (June 03, 2010 8:43 AM)  O2 Flow:  Room air  Physical Exam  Additional Exam:  wt  224 May 23, 2009  > 226 September 10, 2009 > 229 November 05, 2009 > 232 June 03, 2010  Ambulatory unhappy appearing  wf in no acute distress. HEENT: nl dentition,  and orophanx. Nl external ear canals without cough reflex, elongated uvula, thick tongue.  Mod bilateral  turbinate edema mucoid secretions/ crusting moderate also bilateral Neck without JVD/Nodes/TM Lungs clear to A and P bilaterally without cough on insp or exp maneuvers RRR no s3 or murmur or increase in P2.  No edema Abd soft and benign with nl excursion in the supine position. No bruits or organomegaly Ext warm without calf tenderness, cyanosis clubbing Skin warm and dry without lesions   Neuro alert, nl sensorium,  no motor, cerebellar def MS  Nl gait, no joint restrictions      Vitamin D (25-Hydroxy)                        [L]  22 ng/mL                    30-89    Cholesterol               197 mg/dL                   2-440     ATP III Classification            Desirable:  < 200 mg/dL                    Borderline High:  200 - 239 mg/dL               High:  > = 240 mg/dL   Triglycerides             104.0 mg/dL                 1.0-272.5     Normal:  <150 mg/dL     Borderline High:  366 - 199 mg/dL   HDL                       44.03 mg/dL                 >47.42  VLDL Cholesterol          20.8 mg/dL                  1.6-10.9   LDL Cholesterol      [H]  604 mg/dL                   5-40  CHO/HDL Ratio:  CHD Risk                             5                    Men           Women     1/2 Average Risk     3.4          3.3     Average Risk          5.0          4.4     2X Average Risk          9.6          7.1     3X Average Risk          15.0          11.0                           Tests: (2) BMP (METABOL)   Sodium                    139 mEq/L                   135-145   Potassium                 4.4 mEq/L                   3.5-5.1   Chloride                  103 mEq/L                   96-112   Carbon Dioxide       [H]  33 mEq/L                    19-32   Glucose                   93 mg/dL                    98-11   BUN                       14 mg/dL                    9-14   Creatinine                0.7 mg/dL                   7.8-2.9   Calcium                   9.1 mg/dL                   5.6-21.3   GFR  93.07 mL/min                >60  Tests: (3) CBC Platelet w/Diff (CBCD)   White Cell Count          7.4 K/uL                    4.5-10.5   Red Cell Count            4.26 Mil/uL                 3.87-5.11   Hemoglobin                13.2 g/dL                   11.9-14.7   Hematocrit                38.6 %                      36.0-46.0   MCV                       90.5 fl                     78.0-100.0   MCHC                      34.3 g/dL                   82.9-56.2   RDW                       13.4 %                      11.5-14.6   Platelet Count            308.0 K/uL                  150.0-400.0   Neutrophil %              59.9 %                      43.0-77.0   Lymphocyte %              30.2 %                      12.0-46.0   Monocyte %                6.6 %                       3.0-12.0   Eosinophils%              2.6 %                       0.0-5.0   Basophils %               0.7 %                       0.0-3.0   Neutrophill Absolute      4.4 K/uL                    1.4-7.7   Lymphocyte Absolute  2.2 K/uL                    0.7-4.0   Monocyte Absolute         0.5 K/uL                    0.1-1.0  Eosinophils,  Absolute                             0.2 K/uL                    0.0-0.7   Basophils Absolute        0.1 K/uL                    0.0-0.1  Tests: (4) Hepatic/Liver Function Panel (HEPATIC)   Total Bilirubin           0.5 mg/dL                   1.1-9.1   Direct Bilirubin          0.1 mg/dL                   4.7-8.2   Alkaline Phosphatase      64 U/L                      39-117   AST                       20 U/L                      0-37   ALT                       23 U/L                      0-35   Total Protein             6.6 g/dL                    9.5-6.2   Albumin                   3.9 g/dL                    1.3-0.8  Tests: (5) TSH (TSH)   FastTSH                   1.17 uIU/mL                 0.35-5.50  Tests: (6) UDip Only (UDIP)   Color                     LT. YELLOW       RANGE:  Yellow;Lt. Yellow   Clarity                   CLEAR                       Clear   Specific Gravity          1.015                       1.000 -  1.030   Urine Ph                  7.0                         5.0-8.0   Protein                   NEGATIVE                    Negative   Urine Glucose             NEGATIVE                    Negative   Ketones                   NEGATIVE                    Negative   Urine Bilirubin           NEGATIVE                    Negative   Blood                     TRACE-INTACT                Negative   Urobilinogen              1.0                         0.0 - 1.0   Leukocyte Esterace        NEGATIVE                    Negative   Nitrite                   NEGATIVE                    Negative  Impression & Recommendations:  Problem # 1:  COUGH (ICD-786.2)  controlled  with max rx of GERD . Upper airway cough syndrome, so named because it's frequently impossible to sort out how much is LPR vs CR/sinusitis with freq throat clearing generating secondary extra esophageal GERD from wide swings in gastric pressure that occur with throat clearing, promoting self use of  mint and menthol lozenges that reduce the lower esophageal sphincter tone and exacerbate the problem further.  These symptoms are easily confused with asthma/copd by even experienced pulmonogists because they overlap so much. These are the same pts who not infrequently have failed to tolerate ace inhibitors,  dry powder inhalers or biphosphonates or report having reflux symptoms that don't respond to standard doses of PPI   Problem # 2:  RHINITIS, CHRONIC (ICD-472.0) I emphasized that nasal steroids have no immediate benefit in terms of improving symptoms.  To help them reached the target tissue, the patient should use Afrin two puffs every 12 hours applied one min before using the nasal steroids.  Afrin should be stopped after no more than 5 days.  If the symptoms worsen, Afrin can be restarted after 5 days off of therapy to prevent rebound congestion from overuse of Afrin.  I also emphasized that in no way are nasal steroids a concern in terms of "addiction".    Problem # 3:  UNSPECIFIED  VITAMIN D DEFICIENCY (ICD-268.9)   Vit D def, rx with 50 k twice weekly for 12 weeks, then recheck level    Problem # 4:  HYPERCHOLESTEROLEMIA (ICD-272.0)  Labs Reviewed: SGOT: 29 (05/23/2009)   SGPT: 37 (05/23/2009)   HDL:36.40 (05/23/2009), 34.3 (08/03/2008)  LDL:106 (05/23/2009), 141 (08/03/2008) > 136 June 04, 2010    Chol:154 (05/23/2009), 193 (08/03/2008)  Trig:60.0 (05/23/2009), 87 (08/03/2008)  Work on wt loss  Problem # 5:  MORBID OBESITY (ICD-278.01)  Weight control is a matter of calorie balance which needs to be tilted in the pt's favor by eating less and exercising more.  Specifically, I recommended  exercise at a level where pt  is short of breath but not out of breath 30 minutes daily.  If not losing weight on this program, I would strongly recommend pt see a nutritionist with a food diary recorded for two weeks prior to the visit.     Other Orders: Est. Patient 40-64 years  [id number]) T-Vitamin D (25-Hydroxy) [phone]) Misc. Referral (Misc. Ref) Misc. Referral (Misc. Ref) TLB-Lipid Panel (80061-LIPID) TLB-BMP (Basic Metabolic Panel-BMET) (80048-METABOL) TLB-CBC Platelet - w/Differential (85025-CBCD) TLB-Hepatic/Liver Function Pnl (80076-HEPATIC) TLB-TSH (Thyroid Stimulating Hormone) (84443-TSH) TLB-Udip ONLY (81003-UDIP) EKG w/ Interpretation (93000)  Patient Instructions: 1)  Call [phone] for your results w/in next 3 days - if there's something important  I feel you need to know,  I'll be in touch with you directly.  2)  See Patient Care Coordinator before leaving for nutrition eval at [facility] with a food diary x 2 weeks 3)  Return to office in 3 months, sooner if needed  Prescriptions: OMNARIS 50 MCG/ACT SUSP (CICLESONIDE) apply twice daily  #1 x 11   Entered and Authorized by:   Nyoka Cowden MD   Signed by:   Nyoka Cowden MD on 06/03/2010   Method used:   Electronically to        [facility]* (retail)       [address]       [place], [place]  [address]       Ph: [phone]       Fax: [phone]   RxID:   [id number]

## 2010-09-23 NOTE — Miscellaneous (Signed)
Summary: updated med list  Medications Added PX FIBER 0.52 GM CAPS (PSYLLIUM) take 2 capsules two times a day TUSSIN COUGH 15 MG CAPS (DEXTROMETHORPHAN HBR) use 2 caps by mouth every 6 hours as needed SALINE NASAL SPRAY 0.65 % SOLN (SALINE) use 2 puffs every 4 hours as needed       Clinical Lists Changes  Medications: Added new medication of PX FIBER 0.52 GM CAPS (PSYLLIUM) take 2 capsules two times a day Removed medication of * BIPAP 14/8 at bedtime Added new medication of TUSSIN COUGH 15 MG CAPS (DEXTROMETHORPHAN HBR) use 2 caps by mouth every 6 hours as needed Removed medication of XANAX 0.25 MG  TABS (ALPRAZOLAM) 1/2 tab as needed Removed medication of PREDNISONE 10 MG  TABS (PREDNISONE) 4 each am x 2days, 2x2days, 1x2days and stop Added new medication of SALINE NASAL SPRAY 0.65 % SOLN (SALINE) use 2 puffs every 4 hours as needed

## 2010-09-23 NOTE — Letter (Signed)
Summary: Endoscopy Letter  [facility]  [address] [place], [place] [address]   Phone: [phone]  Fax: [phone]      Jan 07, 2010 MRN: [id number]   [name] [address] [place], [place]  [address]   Dear [name],   According to your medical record, it is time for you to schedule an Endoscopy. Endoscopic screening is recommended for patients with certain upper digestive tract conditions because of associated increased risk for cancers of the upper digestive system.  This letter has been generated based on the recommendations made at the time of your prior procedure. If you feel that in your particular situation this may no longer apply, please contact our office.  Please call our office at [phone]) to schedule this appointment or to update your records at your earliest convenience.  Thank you for cooperating with [place] to provide you with the very best care possible.   Sincerely,  Judie Petit T. Russella Dar, M.D.  [facility] Gastroenterology Division [phone]

## 2010-09-23 NOTE — Assessment & Plan Note (Signed)
Summary: Primary care /  f/u ov    Primary Provider/Referring Provider:  Dorthey Sawyer  CC:  6 wk followup.  Pt c/o nasal congestion since this am.  No other complaints today.[name]  History of Present Illness: 65 yowf never smoker  with morbid obesity complicated by gastroesophageal reflux disease, and also a history of obstructive sleep apnea, but unable to tolerate CPAP > on BIPAP  April 05, 2008 ov:  new onset sensation something stuck in her throat with  no dysphagia or fever and  more of a tickle than a pain, resolved on short term pepcid at bedtime.  August 03, 2008 cpx with cough x 24 h no purulent sputum.  Started back on pepcid at hs. plus round of augmentin and prednisone, transiently better.  October 02, 2008 ov co worsening  cough and chest congestion x sev weeks,  minimal yellow mucus and doe, no resting sob.    Not using PPI ac as rec but restarted pepcid at bedtime at onset of cough   May 23, 2009 cpx never get completely better since onset of hot weather in early summer of 2010.  Noct nasal obstruction and cough, not using hs pepcid as prev rec.  Better when remembers afrin before nasonex.   July 11, 2009  Pt c/o congestion and runny nose on and off x 2 months.  She also c/o hoarsness and dry cough x 1 wk.   seems first thing am with nasal obstruction and sneezing. rec steroid taper and Chlortimeton   August 20, 2009 states all better for sev weeks after prednisone, similar response in past-  c/o cough since 12/15- worsening over the past 4-5 days.  Pt states that cough is dry and hacky.    September 10, 2009--Returns for follow up and med review. Last visit flare in cough, Tx w/ steroids and aggressive GERD/Rhinitis prevention, and cough control. She is much improved w/ total resolution of cough for 3 weeks.   November 05, 2009 6 wk followup.  Pt c/o nasal congestion since this am.  No other complaints. overall better in terms of cough control with ppi two times a  day and pepcid at hs.  Pt denies any significant sore throat, dysphagia, itching, sneezing,  nasal congestion or excess secretions,  fever, chills, sweats, unintended wt loss, pleuritic or exertional cp, hempoptysis, change in activity tolerance  orthopnea pnd.   Current Medications (verified): 1)  Cymbalta 30 Mg  Cpep (Duloxetine Hcl) .... Take 1 Tablet By Mouth Once A Day 2)  Pantoprazole Sodium 40 Mg  Tbec (Pantoprazole Sodium) .... Take One 30-60 Min Before First and Last Meals of The Day 3)  Pepcid Ac Maximum Strength 20 Mg Tabs (Famotidine) .... One At Bedtime 4)  Vitamin B-6 .... 1 Once Daily 5)  Omnaris 50 Mcg/act Susp (Ciclesonide) .... Apply Twice Daily 6)  Px Fiber 0.52 Gm Caps (Psyllium) .... Take 2 Capsules Two Times A Day 7)  Caltrate 600+d 600-400 Mg-Unit  Tabs (Calcium Carbonate-Vitamin D) .... Take 1 Tablet By Mouth Once A Day 8)  Vitamin C 500 Mg Tabs (Ascorbic Acid) .[name].. 1 Once Daily 9)  Tussin Cough 15 Mg Caps (Dextromethorphan Hbr) .... Use 2 Caps By Mouth Every 6 Hours As Needed 10)  Tramadol Hcl 50 Mg  Tabs (Tramadol Hcl) .... One To Two By Mouth Every 4-6 Hours As Needed For Pain or Cough 11)  Chlor-Trimeton 4 Mg Tabs (Chlorpheniramine Maleate) .... One Every 6 Hours As Needed For Congestion  or Drainage 12)  Ibuprofen 200 Mg Tabs (Ibuprofen) .... As Directed On Bottle As Needed 13)  Advil Cold & Sinus Liqui-Gels 30-200 Mg Caps (Pseudoephedrine-Ibuprofen) .... As Directed As Needed 14)  Afrin Nasal Spray 0.05 % Soln (Oxymetazoline Hcl) .... As Directed As Needed 15)  Saline Nasal Spray 0.65 % Soln (Saline) .... Use 2 Puffs Every 4 Hours As Needed 16)  Xanax 0.25 Mg Tabs (Alprazolam) .... 1/2 Once Daily As Needed  Allergies (verified): 1)  Epinephrine (Epinephrine)  Past History:  Past Medical History: HIATAL HERNIA (ICD-553.3) RHINITIS, CHRONIC (ICD-472.0)     - Remote allergy testing = molds/dust only     - Sinus CT 09/06/08  mild thickending only OBSTRUCTIVE  SLEEP APNEA (ICD-327.23)..................................................[name] KitchenSood --Nocturnal BIPAP MORBID OBESITY (ICD-278.01)     - Target wt  =  158  for BMI < 30  POSITIVE PPD (ICD-795.5) COUGH (ICD-786.2) GERD (ICD-530.81) Barretts esophagus Esophageal Stricture EROSIVE GASTRITIS BENIGN GASTRIC FUNDIC GLAND POLYPS DIVERTICULOSIS HEALTH MAINTENANCE........................................................[name] KitchenWert    - DT May 23, 2009     - Pneumovax 9/07    - CPX May 23, 2009  Complex med regimen -------Meds reviewed with pt education and computerized med calendar completed September 10, 2009   Past Surgical History: Csection x two Cholecystectomy-1985 Right foot fracture Morton neuroma left foot Tubal Ligation 1981 Right arm surgery Dec 15th 2010  Vital Signs:  Patient profile:   65 year old female Weight:      229.13 pounds O2 Sat:      96 % on Room air Temp:     97.7 degrees F oral Pulse rate:   72 / minute BP sitting:   138 / 78  (left arm)  Vitals Entered By: [name] (November 05, 2009 4:37 PM)  O2 Flow:  Room air  Physical Exam  Additional Exam:  wt  224 May 23, 2009  > 230 July 12, 2009 > 226 August 20, 2009 >>.226 September 10, 2009 > 229 November 05, 2009  Ambulatory unhappy appearing  wf in no acute distress. HEENT: nl dentition,  and orophanx. Nl external ear canals without cough reflex, elongated uvula, thick tongue.  Mod bilateral  turbinate edema mucoid secretions/ crusting moderate also bilateral Neck without JVD/Nodes/TM Lungs clear to A and P bilaterally without cough on insp or exp maneuvers RRR no s3 or murmur or increase in P2.  No edema Abd soft and benign with nl excursion in the supine position. No bruits or organomegaly Ext warm without calf tenderness, cyanosis clubbing Skin warm and dry without lesions     Impression & Recommendations:  Problem # 1:  COUGH (ICD-786.2)  Resolved with max rx of GERD . Upper airway  cough syndrome, so named because it's frequently impossible to sort out how much is LPR vs CR/sinusitis with freq throat clearing generating secondary extra esophageal GERD from wide swings in gastric pressure that occur with throat clearing, promoting self use of mint and menthol lozenges that reduce the lower esophageal sphincter tone and exacerbate the problem further.  These symptoms are easily confused with asthma/copd by even experienced pulmonogists because they overlap so much. These are the same pts who not infrequently have failed to tolerate ace inhibitors,  dry powder inhalers or biphosphonates or report having reflux symptoms that don't respond to standard doses of PPI   Orders: Est. Patient Level III ([address])  Problem # 2:  RHINITIS, CHRONIC (ICD-472.0)  I emphasized that nasal steroids have no immediate benefit in terms of improving  symptoms.  To help them reached the target tissue, the patient should use Afrin two puffs every 12 hours applied one min before using the nasal steroids.  Afrin should be stopped after no more than 5 days.  If the symptoms worsen, Afrin can be restarted after 5 days off of therapy to prevent rebound congestion from overuse of Afrin.  I also emphasized that in no way are nasal steroids a concern in terms of "addiction".   Each maintenance medication was reviewed in detail including most importantly the difference between maintenance and as needed and under what circumstances the prns are to be used. This was done in the context of a medication calendar review which provided the patient with a user-friendly unambiguous mechanism for medication administration and reconciliation and provides an action plan for all active problems. It is critical that this be shown to every doctor  for modification during the office visit if necessary so the patient can use it as a working document.     Orders: Est. Patient Level III ([address])  Medications Added to Medication List  This Visit: 1)  Xanax 0.25 Mg Tabs (Alprazolam) .... 1/2 once daily as needed  Patient Instructions: 1)  Return for CPX September 2011 2)  See calendar for specific medication instructions and bring it back for each and every office visit for every healthcare provider you see.  Without it,  you may not receive the best quality medical care that we feel you deserve.

## 2010-09-23 NOTE — Letter (Signed)
Summary: EGD Instructions  [facility]  [address] [place], [place] [address]   Phone: [phone]  Fax: [phone]       [name]    Apr 30, 1946    MRN: [id number]       Procedure Day Dorna Bloom:  04-18-10, Friday     Arrival Time:   12:30 P.M.     Procedure Time: 1:30 P.M.     Location of Procedure:                    _  x_ [facility] (4th Floor)    PREPARATION FOR ENDOSCOPY   On 04-18-10  Friday   THE DAY OF THE PROCEDURE:  1.   No solid foods, milk or milk products are allowed after midnight the night before your procedure.  2.   Do not drink anything colored red or purple.  Avoid juices with pulp.  No orange juice.  3.  You may drink clear liquids until 11:30 A.M., which is 2 hours before your procedure.                                                                                                CLEAR LIQUIDS INCLUDE: Water Jello Ice Popsicles Tea (sugar ok, no milk/cream) Powdered fruit flavored drinks Coffee (sugar ok, no milk/cream) Gatorade Juice: apple, white grape, white cranberry  Lemonade Clear bullion, consomm, broth Carbonated beverages (any kind) Strained chicken noodle soup Hard Candy   MEDICATION INSTRUCTIONS  Unless otherwise instructed, you should take regular prescription medications with a small sip of water as early as possible the morning of your procedure.           OTHER INSTRUCTIONS  You will need a responsible adult at least 63 years of age to accompany you and drive you home.   This person must remain in the waiting room during your procedure.  Wear loose fitting clothing that is easily removed.  Leave jewelry and other valuables at home.  However, you may wish to bring a book to read or an iPod/MP3 player to listen to music as you wait for your procedure to start.  Remove all body piercing jewelry and leave at home.  Total time from sign-in until discharge is approximately 2-3 hours.  You  should go home directly after your procedure and rest.  You can resume normal activities the day after your procedure.  The day of your procedure you should not:   Drive   Make legal decisions   Operate machinery   Drink alcohol   Return to work  You will receive specific instructions about eating, activities and medications before you leave.    The above instructions have been reviewed and explained to me by  Karl Bales RN  March 10, 2010 5:10 PM    I fully understand and can verbalize these instructions _____________________________ Date _________

## 2010-09-23 NOTE — Progress Notes (Signed)
Summary: surgery clearance form  Phone Note Call from Patient   Caller: Patient Call For: wert Summary of Call: pt dropped off a form re: surgery clearance- (eye). please ask dr wert to sign and then fax to: [phone]. call pt when this has been done [phone]. i have given this form to leslie Initial call taken by: Tivis Ringer,  September 05, 2009 12:22 PM  Follow-up for Phone Call        Form was placed in MW lookat to be filled out.   [name]  September 05, 2009 12:34 PM completed Follow-up by: Nyoka Cowden MD,  September 06, 2009 9:08 AM  Additional Follow-up for Phone Call Additional follow up Details #1::        Surgery clearance form was completed and faxed back to [facility].  Spoke with pt and made aware. Additional Follow-up by: [name],  September 06, 2009 9:18 AM

## 2010-09-23 NOTE — Progress Notes (Signed)
Summary: Results on Phone tree w/in 3 days  Phone Note Call from Patient Call back at [phone] ex [phone]   Caller: Patient Call For: wert Summary of Call: calling for lab results Initial call taken by: Rickard Patience,  June 05, 2010 9:30 AM  Follow-up for Phone Call        Dr [name], pt tried calling phone tree and msg states results are not available.  I tried to and got the same msg.  Pls advise results/recs thanks! Follow-up by: [name],  June 05, 2010 10:01 AM  Additional Follow-up for Phone Call Additional follow up Details #1::        the instructions say  it will be on the phone w/in three days and it's only been 2 - I put in on today Additional Follow-up by: Nyoka Cowden MD,  June 05, 2010 11:25 AM    Additional Follow-up for Phone Call Additional follow up Details #2::    pt advised. Carron Curie CMA  June 05, 2010 11:41 AM

## 2010-09-23 NOTE — Miscellaneous (Signed)
Summary: Pantoprazole refill  Clinical Lists Changes  Medications: Changed medication from PANTOPRAZOLE SODIUM 40 MG  TBEC (PANTOPRAZOLE SODIUM) Take one 30-60 min before first and last meals of the day to PANTOPRAZOLE SODIUM 40 MG  TBEC (PANTOPRAZOLE SODIUM) Take one 30-60 min before first and last meals of the day Needs office visit for any further refills! - Signed Rx of PANTOPRAZOLE SODIUM 40 MG  TBEC (PANTOPRAZOLE SODIUM) Take one 30-60 min before first and last meals of the day Needs office visit for any further refills!;  #60 x 0;  Signed;  Entered by: Christie Nottingham CMA (AAMA);  Authorized by: Meryl Dare MD [facility];  Method used: Electronically to [facility] [address] [place], [place]  [address], Ph: [phone], Fax: [phone]    Prescriptions: PANTOPRAZOLE SODIUM 40 MG  TBEC (PANTOPRAZOLE SODIUM) Take one 30-60 min before first and last meals of the day Needs office visit for any further refills!  #60 x 0   Entered by:   Christie Nottingham CMA (AAMA)   Authorized by:   Meryl Dare MD [facility]   Signed by:   Christie Nottingham CMA (AAMA) on 03/12/2010   Method used:   Electronically to        [facility]* (retail)       [address]       [place], [place]  [address]       Ph: [phone]       Fax: [phone]   RxID:   [id number]

## 2010-09-23 NOTE — Letter (Signed)
Summary: [facility] for CPAP Supplies/[facility]  [facility] for CPAP Supplies/[facility]   Imported By: Sherian Rein 11/25/2009 11:29:22  _____________________________________________________________________  External Attachment:    Type:   Image     Comment:   External Document

## 2010-09-23 NOTE — Letter (Signed)
Summary: Patient Notice-Endo Biopsy Results  [facility]  [address] [place], [place] [address]   Phone: [phone]  Fax: [phone]        April 25, 2010 MRN: [id number]    [name] [address] [place], [place]  [address]    Dear [name],  I am pleased to inform you that the biopsies taken during your recent endoscopic examination did not show any evidence of cancer upon pathologic examination.   The esophageal biopsies showed changes of acid reflux but no evidence of Barretts. The gastric biopsies showed benign fundic gland polyps.  Continue with the treatment plan as outlined on the day of your      exam.  Please call us if you are having persistent problems or have questions about your condition that have not been fully answered at this time.  Sincerely,  Meryl Dare MD [facility]  This letter has been electronically signed by your physician.  Appended Document: Patient Notice-Endo Biopsy Results letter mailed 9.7.2011

## 2010-09-23 NOTE — Progress Notes (Signed)
Summary: order for mammograms ok  Phone Note Call from Patient Call back at Work Phone [phone] Call back at [phone]   Caller: Patient Call For: wert Reason for Call: Talk to Nurse Summary of Call: need order to get her yearly mammogram at out pt. center [facility]. Initial call taken by: Eugene Gavia,  March 12, 2010 10:18 AM  Follow-up for Phone Call        MW  is this ok for me to send an order for her mammogram?   please advise. thanks Randell Loop CMA  March 12, 2010 10:29 AM  ok Follow-up by: Nyoka Cowden MD,  March 12, 2010 1:11 PM  Additional Follow-up for Phone Call Additional follow up Details #1::        order sent to [facility].Reynaldo Minium CMA  March 12, 2010 1:34 PM

## 2010-09-23 NOTE — Progress Notes (Signed)
Summary: nos appt  Phone Note Call from Patient   Caller: [email or web address]  Call For: Shelvy Perazzo Summary of Call: LMTCB x2 to [facility]sc nos from 11/18. Initial call taken by: Darletta Moll,  July 14, 2010 2:52 PM

## 2010-09-23 NOTE — Miscellaneous (Signed)
Summary: BPAP 14/9 download 04/27/10 to 05/26/10   Clinical Lists Changes Used on 27 of 30 days.  Average 2hrs 28 min.  Average AHI 1.9 with BPAP 14/9.  Minimal airleak.  Will need to change BPAP to 12/8 to see if this improves tolerance.  Will discuss further at [facility] on 07/11/10.

## 2010-09-23 NOTE — Miscellaneous (Signed)
Summary: [facility] previsit  Clinical Lists Changes 

## 2010-09-23 NOTE — Progress Notes (Signed)
Summary: Protonix refill  Medications Added PANTOPRAZOLE SODIUM 40 MG  TBEC (PANTOPRAZOLE SODIUM) Take one 30-60 min before first and last meals of the day       ---- Converted from flag ---- ---- 04/15/2010 4:53 PM, Karna Christmas wrote: pt. has an appt. on 04-18-10 for an ENDO wants to know if her protonix can be refilled until then....Marland Kitchen[facility]land [facility]  # [phone] ------------------------------  Phone Note Call from Patient Call back at [phone]   Caller: Patient Call For: CMA Summary of Call: Rx was sent to pts pharmacy and pt notified to keep appt for EGD for any further refills. Initial call taken by: Christie Nottingham CMA Duncan Dull),  April 16, 2010 8:59 AM    New/Updated Medications: PANTOPRAZOLE SODIUM 40 MG  TBEC (PANTOPRAZOLE SODIUM) Take one 30-60 min before first and last meals of the day Prescriptions: PANTOPRAZOLE SODIUM 40 MG  TBEC (PANTOPRAZOLE SODIUM) Take one 30-60 min before first and last meals of the day  #60 x 0   Entered by:   Christie Nottingham CMA (AAMA)   Authorized by:   Meryl Dare MD [facility]   Signed by:   Christie Nottingham CMA (AAMA) on 04/16/2010   Method used:   Electronically to        [facility]* (retail)       [address]       [place], [place]  [address]       Ph: [phone]       Fax: [phone]   RxID:   [id number]

## 2010-09-23 NOTE — Assessment & Plan Note (Signed)
Summary: NP follow up - med calendar   Primary Provider/Referring Provider:  Dorthey Sawyer  CC:  new med calendar - no complaints.  pt has brought all meds with her.  History of Present Illness: 73 yowf never smoker  with morbid obesity complicated by gastroesophageal reflux disease, and also a history of obstructive sleep apnea, but unable to tolerate CPAP > on BIPAP  April 05, 2008 ov:  new onset sensation something stuck in her throat with  no dysphagia or fever and  more of a tickle than a pain, resolved on short term pepcid at bedtime.  August 03, 2008 cpx with cough x 24 h no purulent sputum.  Started back on pepcid at hs. plus round of augmentin and prednisone, transiently better.  October 02, 2008 ov co worsening  cough and chest congestion x sev weeks,  minimal yellow mucus and doe, no resting sob.    Not using PPI ac as rec but restarted pepcid at bedtime at onset of cough   May 23, 2009 cpx never get completely better since onset of hot weather in early summer of 2010.  Noct nasal obstruction and cough, not using hs pepcid as prev rec.  Better when remembers afrin before nasonex.   July 11, 2009  Pt c/o congestion and runny nose on and off x 2 months.  She also c/o hoarsness and dry cough x 1 wk.   seems first thing am with nasal obstruction and sneezing. rec steroid taper and Chlortimeton   August 20, 2009 states all better for sev weeks after prednisone, similar response in past-  c/o cough since 12/15- worsening over the past 4-5 days.  Pt states that cough is dry and hacky.    September 10, 2009--Returns for follow up and med review. Last visit flare in cough, Tx w/ steroids and aggressive GERD/Rhinitis prevention, and cough control. She is much improved w/ total resolution of cough for 3 weeks. We reviewed all her meds and completed a med calendar today. Denies chest pain, dyspnea, orthopnea, hemoptysis, fever, n/v/d, edema, headache.   Medications Prior to  Update: 1)  Pantoprazole Sodium 40 Mg  Tbec (Pantoprazole Sodium) .... Take One 30-60 Min Before First and Last Meals of The Day 2)  Caltrate 600+d 600-400 Mg-Unit  Tabs (Calcium Carbonate-Vitamin D) .... Take 1 Tablet By Mouth Once A Day 3)  Cymbalta 30 Mg  Cpep (Duloxetine Hcl) .... Take 1 Tablet By Mouth Once A Day 4)  Bipap 14/8 .... At Bedtime 5)  Xanax 0.25 Mg  Tabs (Alprazolam) .... 1/2 Tab As Needed 6)  Advil Cold & Sinus Liqui-Gels 30-200 Mg Caps (Pseudoephedrine-Ibuprofen) .... As Directed As Needed 7)  Afrin Nasal Spray 0.05 % Soln (Oxymetazoline Hcl) .... As Directed As Needed 8)  Pepcid Ac Maximum Strength 20 Mg Tabs (Famotidine) .... One At Bedtime 9)  Chlor-Trimeton 4 Mg Tabs (Chlorpheniramine Maleate) .... One Every 6 Hours As Needed For Congestion or Drainage 10)  Vitamin C 500 Mg Tabs (Ascorbic Acid) .[name].. 1 Once Daily 11)  Ibuprofen 200 Mg Tabs (Ibuprofen) .... As Directed On Bottle As Needed 12)  Vitamin B-6 .... 1 Once Daily 13)  Omnaris 50 Mcg/act Susp (Ciclesonide) .... Apply Twice Daily 14)  Tramadol Hcl 50 Mg  Tabs (Tramadol Hcl) .... One To Two By Mouth Every 4-6 Hours As Needed For Pain or Cough 15)  Prednisone 10 Mg  Tabs (Prednisone) .... 4 Each Am X 2days, 2x2days, 1x2days and Stop  Allergies (verified): 1)  Epinephrine (Epinephrine)  Past History:  Past Surgical History: Last updated: 02/01/2008 Csection x two Cholecystectomy-1985 Right foot fracture Morton neuroma left foot Tubal Ligation 1981  Family History: Last updated: 05/23/2009 Father - CAD, ? 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  Married.  2 Kids. Never smoker,   Husband smokes Illicit Drug Use -  no Patient does not get regular exercise.  No etoh  Risk Factors: Exercise: no (02/01/2008)  Risk Factors: Smoking Status: never (11/04/2007)  Past Medical History: HIATAL HERNIA (ICD-553.3) RHINITIS, CHRONIC (ICD-472.0)     - Remote allergy testing = molds/dust only     - Sinus CT 09/06/08  mild thickending only OBSTRUCTIVE SLEEP APNEA (ICD-327.23)..................................................[name] --Nocturnal BIPAP MORBID OBESITY (ICD-278.01)     - Target wt  =  158  for BMI < 30  POSITIVE PPD (ICD-795.5) COUGH (ICD-786.2) GERD (ICD-530.81) Barretts esophagus Esophageal Stricture EROSIVE GASTRITIS BENIGN GASTRIC FUNDIC GLAND POLYPS DIVERTICULOSIS HEALTH MAINTENANCE......................................................Marland KitchenWert    - DT May 23, 2009     - Pneumovax 9/07    - CPX May 23, 2009  Complex med regimen -------Meds reviewed with pt education and computerized med calendar completed September 10, 2009   Review of Systems      See HPI  Vital Signs:  Patient profile:   65 year old female Height:      62 inches Weight:      226.38 pounds BMI:     41.56 O2 Sat:      93 % on Room air Temp:     97.9 degrees F oral Pulse rate:   77 / minute BP sitting:   160 / 76  (left arm) Cuff size:   large  Vitals Entered By: Boone Master CNA (September 10, 2009 9:11 AM)  O2 Flow:  Room air CC: new med calendar - no complaints.  pt has brought all meds with her Is Patient Diabetic? No Comments Medications reviewed with patient Daytime contact number verified with patient. Boone Master CNA  September 10, 2009 9:12 AM    Physical Exam  Additional Exam:  wt 229 > 227 October 02, 2008 > 224 May 23, 2009  > 230 July 12, 2009 > 226 August 20, 2009 >>.226 September 10, 2009  Ambulatory unhappy appearing  wf in no acute distress. HEENT: nl dentition,  and orophanx. Nl external ear canals without cough reflex, elongated uvula, thick tongue.  Mod bilateral   turbinate edema mucoid secretions. Neck without JVD/Nodes/TM Lungs clear to A and P bilaterally without cough on insp or exp maneuvers RRR no s3 or murmur or increase in P2.  No edema Abd soft and benign with nl excursion in the supine position. No bruits or organomegaly Ext warm without calf tenderness, cyanosis clubbing Skin warm and dry without lesions     Impression & Recommendations:  Problem # 1:  COUGH (ICD-786.2)  Cyclical cough with recent flare, now improved and cough free s/p aggressive reflux/rhinitis prevention and cough control measures.  Meds reviewed with pt education and computerized med calendar completed/adjusted.   follow up 6 weeks if remains cough free may be able to decrease PPI to once daily .   Orders: Est. Patient Level III ([address])  Complete Medication List:  1)  Pantoprazole Sodium 40 Mg Tbec (Pantoprazole sodium) .... Take one 30-60 min before first and last meals of the day 2)  Caltrate 600+d 600-400 Mg-unit Tabs (Calcium carbonate-vitamin d) .... Take 1 tablet by mouth once a day 3)  Cymbalta 30 Mg Cpep (Duloxetine hcl) .... Take 1 tablet by mouth once a day 4)  Bipap 14/8  .... At bedtime 5)  Xanax 0.25 Mg Tabs (Alprazolam) .... 1/2 tab as needed 6)  Advil Cold & Sinus Liqui-gels 30-200 Mg Caps (Pseudoephedrine-ibuprofen) .... As directed as needed 7)  Afrin Nasal Spray 0.05 % Soln (Oxymetazoline hcl) .... As directed as needed 8)  Pepcid Ac Maximum Strength 20 Mg Tabs (Famotidine) .... One at bedtime 9)  Chlor-trimeton 4 Mg Tabs (Chlorpheniramine maleate) .... One every 6 hours as needed for congestion or drainage 10)  Vitamin C 500 Mg Tabs (Ascorbic acid) .[name].. 1 once daily 11)  Ibuprofen 200 Mg Tabs (Ibuprofen) .... As directed on bottle as needed 12)  Vitamin B-6  .... 1 once daily 13)  Omnaris 50 Mcg/act Susp (Ciclesonide) .... Apply twice daily 14)  Tramadol Hcl 50 Mg Tabs (Tramadol hcl) .... One to two by mouth every 4-6 hours as needed for pain  or cough 15)  Prednisone 10 Mg Tabs (Prednisone) .... 4 each am x 2days, 2x2days, 1x2days and stop  Patient Instructions: 1)  Continue on same meds.  2)  Bring med calendar to each visit.  3)  follow up Dr. [name] in 6-8 weeks  4)  Please contact office for sooner follow up if symptoms do not improve or worsen    Immunization History:  Influenza Immunization History:    Influenza:  historical (06/24/2009)  Pneumovax Immunization History:    Pneumovax:  historical (05/24/2008)

## 2010-09-23 NOTE — Procedures (Signed)
Summary: Upper Endoscopy  Patient: [name] Note: All result statuses are Final unless otherwise noted.  Tests: (1) Upper Endoscopy (EGD)   EGD Upper Endoscopy       DONE     [facility]     [address]     [place], [place]  [address]           ENDOSCOPY PROCEDURE REPORT           PATIENT:  [name], [name]  MR#:  [id number]     BIRTHDATE:  [birth year], 64 yrs. old  GENDER:  female     ENDOSCOPIST:  Judie Petit T. Russella Dar, MD, [facility]           PROCEDURE DATE:  04/18/2010     PROCEDURE:  EGD with biopsy     ASA CLASS:  Class II     INDICATIONS:  Barrett's Esophagus     MEDICATIONS:  Fentanyl 50 mcg IV, Versed 5 mg IV     TOPICAL ANESTHETIC:  Exactacain Spray     DESCRIPTION OF PROCEDURE:   After the risks benefits and     alternatives of the procedure were thoroughly explained, informed     consent was obtained.  The LB [id number] endoscope was     introduced through the mouth and advanced to the second portion of     the duodenum, without limitations.  The instrument was slowly     withdrawn as the mucosa was fully examined.     <<PROCEDUREIMAGES>>     A stricture was found at the gastroesophageal junction. It was     mild. Multiple biopsies were obtained and sent to pathology. No     endoscopic evidence of Barretts.  There were multiple polyps     identified in the body and fundus of the stomach. They were 4 - 6     mm in size. Multiple biopsies were obtained and sent to pathology.     Duodenitis was found in the bulb of the duodenum. It was     erythematous.  Otherwise the examination was normal.     Retroflexed views revealed a hiatal hernia, small.  The scope was     then withdrawn from the patient and the procedure completed.           COMPLICATIONS:  None           ENDOSCOPIC IMPRESSION:     1) Stricture at the gastroesophageal junction     2) 4 - 6 mm gastric polyps, multiple     3) Duodenitis     4) Small hiatal hernia           RECOMMENDATIONS:  1) Anti-reflux regimen     2) continue PPI     3) Await pathology results and if no Barretts noted plan to     discontinue Barretts surveillance                 Devansh Riese T. Russella Dar, MD, [facility]           CC:  Nyoka Cowden, MD           n.     Rosalie DoctorVenita Lick. Jessenya Berdan at 04/18/2010 01:51 PM           Robby Sermon, [phone]  Note: An exclamation mark (!) indicates a result that was not dispersed into the flowsheet. Document Creation Date: 04/18/2010 1:52 PM _______________________________________________________________________  (1) Order result status: Final Collection or observation  date-time: 04/18/2010 13:45 Requested date-time:  Receipt date-time:  Reported date-time:  Referring Physician:   Ordering Physician: Claudette Head [phone]) Specimen Source:  Source: Launa Grill Order Number: [id number] Lab site:

## 2010-09-23 NOTE — Miscellaneous (Signed)
Summary: Orders Update rx for protonix  Clinical Lists Changes  Medications: Added new medication of PANTOPRAZOLE SODIUM 40 MG  TBEC (PANTOPRAZOLE SODIUM) 1 twice a day 30 minutes before meals - Signed Rx of PANTOPRAZOLE SODIUM 40 MG  TBEC (PANTOPRAZOLE SODIUM) 1 twice a day 30 minutes before meals;  #60 x 11;  Signed;  Entered by: Oda Cogan RN;  Authorized by: Meryl Dare MD [facility];  Method used: Electronically to [facility] [address] [place], [place]  [address], Ph: [phone], Fax: [phone]    Prescriptions: PANTOPRAZOLE SODIUM 40 MG  TBEC (PANTOPRAZOLE SODIUM) 1 twice a day 30 minutes before meals  #60 x 11   Entered by:   Oda Cogan RN   Authorized by:   Meryl Dare MD [facility]   Signed by:   Oda Cogan RN on 04/18/2010   Method used:   Electronically to        [facility]* (retail)       [address]       [place], [place]  [address]       Ph: [phone]       Fax: [phone]   RxID:   [id number]

## 2010-09-25 NOTE — Progress Notes (Signed)
Summary: orders for nutrientist and labs  Phone Note Call from Patient   Caller: Patient Call For: dr [name] Summary of Call: Patient stated that she was supposed nutrientist and they have misplaced the order she has an appointment tomorrow and wants to know if another order can be faxed to [facility] at [phone]. Also her vitamin D was low and she was to repeat it this month and would like the lab order faxed to [phone] Initial call taken by: Vedia Coffer,  September 03, 2010 11:13 AM  Follow-up for Phone Call        Order for nutritionist was faxed to the number requested and order for vitamin d level also faxed. I LMOM for to be made aware that this was done. Follow-up by: [name],  September 03, 2010 12:24 PM

## 2010-09-25 NOTE — Progress Notes (Signed)
Summary: skelaxin rx  Phone Note Call from Patient Call back at [phone] ext [phone]   Caller: Patient Call For: parrett Summary of Call: Pt states pharmacy didn't receive rx for skelaxin 800mg .//[facility] Initial call taken by: Darletta Moll,  August 05, 2010 9:35 AM  Follow-up for Phone Call        Per pt instructions from yesterday, pt was to start on skelaxin 800mg  three times a day as needed muscle spasms but no rx was sent.  TP, pls advise how many fills you would like to give pt.  Thanks!  Follow-up by: Gweneth Dimitri RN,  August 05, 2010 10:27 AM  Additional Follow-up for Phone Call Additional follow up Details #1::        sorry  no refills skelaxin 800mg  1 by mouth three times a day prn muscle spams #30 no refills.  Additional Follow-up by: Rubye Oaks NP,  August 05, 2010 10:37 AM    Additional Follow-up for Phone Call Additional follow up Details #2::    Rx sent -- pt aware Gweneth Dimitri RN  August 05, 2010 10:47 AM   New/Updated Medications: SKELAXIN 800 MG TABS (METAXALONE) Take 1 tablet by mouth three times a day as needed for muscle spasms Prescriptions: SKELAXIN 800 MG TABS (METAXALONE) Take 1 tablet by mouth three times a day as needed for muscle spasms  #30 x 0   Entered by:   Gweneth Dimitri RN   Authorized by:   Rubye Oaks NP   Signed by:   Gweneth Dimitri RN on 08/05/2010   Method used:   Electronically to        [facility]* (retail)       [address]       [place], [place]  [address]       Ph: [phone]       Fax: [phone]   RxID:   [id number]

## 2010-09-25 NOTE — Progress Notes (Signed)
Summary: skelaxin on back order - LMTCB x 1  Phone Note From Pharmacy   Caller: candace w/ [facility] Call For: tammy parrett  Summary of Call: skelaxin is on back order. sub? [phone] Initial call taken by: Tivis Ringer, CNA,  August 05, 2010 11:30 AM  Follow-up for Phone Call        Tammy, Per Candice with [facility], skelaxin has been on backorder for over 1 month.  Company is no giving a release date.  Would like to know if this can be substituted with something else.  Thanks! Follow-up by: Gweneth Dimitri RN,  August 05, 2010 11:44 AM  Additional Follow-up for Phone Call Additional follow up Details #1::        flexeril 5mg  1 by mouth three times a day as needed muscle spasm, #30 no refills  Additional Follow-up by: Rubye Oaks NP,  August 05, 2010 11:53 AM    Additional Follow-up for Phone Call Additional follow up Details #2::    Kathie Rhodes with [facility] was informed of med change and verbalized understanding.  Called pt at [phone] ext [phone] to inform her of med change - [facility]  Gweneth Dimitri RN  August 05, 2010 12:02 PM   Pt called back and notified of med change. Abigail Miyamoto RN  August 05, 2010 12:15 PM    New/Updated Medications: FLEXERIL 5 MG TABS (CYCLOBENZAPRINE HCL) Take 1 tablet by mouth three times a day as needed for muscle spasms Prescriptions: FLEXERIL 5 MG TABS (CYCLOBENZAPRINE HCL) Take 1 tablet by mouth three times a day as needed for muscle spasms  #30 x 0   Entered by:   Gweneth Dimitri RN   Authorized by:   Rubye Oaks NP   Signed by:   Gweneth Dimitri RN on 08/05/2010   Method used:   Telephoned to ...       [facility]* (retail)       [address]       [place], [place]  [address]       Ph: [phone]       Fax: [phone]   RxID:   [id number]

## 2010-09-25 NOTE — Progress Notes (Signed)
Summary: meds   Phone Note Call from Patient   Caller: Patient Call For: tammy parrett Summary of Call: wants to know if tp is ok w/ her taking advil cold and sinus. [phone] Initial call taken by: Tivis Ringer, CNA,  August 07, 2010 12:10 PM  Follow-up for Phone Call        Yes- this is on her med list.  ATC pt- LMOMTCB [name]  August 07, 2010 2:30 PM  Pt staets TP gaveher Mobic on ov 08-04-10 but told her to not tke ibuprofen, but pt states she takes advil cold and sius per MW for her allergies and she is wanting to know can she take this while on mobic? please advise. Carron Curie CMA  August 07, 2010 4:35 PM   Additional Follow-up for Phone Call Additional follow up Details #1::        no  would not want her to take this with mobic switch to tylenol cold and sinus as needed   Additional Follow-up by: Rubye Oaks NP,  August 07, 2010 4:48 PM    Additional Follow-up for Phone Call Additional follow up Details #2::    pt advised to take tylenol cold and sinus while on mobic. Carron Curie CMA  August 07, 2010 4:54 PM

## 2010-09-25 NOTE — Assessment & Plan Note (Signed)
Summary: Acute [name] office visit - low back pain   Primary [name]/Referring [name]:  [name]  CC:  low back pain described as a dull ache / sharp pain with movement x2-3days - states was pushing her grandson around in a double stroller..  History of Present Illness: 65 yowf never smoker  with morbid obesity complicated by gastroesophageal reflux disease, and also a history of obstructive sleep apnea.  April 05, 2008 ov:  new onset sensation something stuck in her throat with  no dysphagia or fever and  more of a tickle than a pain, resolved on short term pepcid at bedtime.  September 10, 2009--Returns for follow up and med review. Last visit flare in cough, Tx w/ steroids and aggressive GERD/Rhinitis prevention, and cough control. She is much improved w/ total resolution of cough.    June 03, 2010 cpx c/o more nasal congestion since september slt tickle in throat. Pt denies any significant sore throat, dysphagia, itching, sneezing,   excess or  secretions,  fever, chills, sweats, unintended wt loss, pleuritic or exertional cp, hempoptysis, change in activity tolerance  orthopnea pnd or leg swelling. Pt also denies any obvious fluctuation in symptoms with weather or environmental change or other alleviating or aggravating factors.    August 04, 2010 --Presents for an acute office visit. Complains of 3 days low back pain described as a dull ache / sharp pain with movement x2-3days. She was pushing her grandson around in a double stroller and feels like she pulled something in her low back. She takes ibuprofen 400mg  three times a day most days from choic coccyx pain from fall several years. prev w/up from ortho with recs to use nsaids for coccyx pain. Denies chest pain, dyspnea, orthopnea, hemoptysis, fever, n/v/d, edema, headache, extremity weakness, radicular symptosm, rash, urinary symptoms, syncope. Pain is worse with turning, bending over. .      Medications Prior to Update: 1)   Cymbalta 60 Mg Cpep (Duloxetine Hcl) .[name].. 1 By Mouth Daily 2)  Pantoprazole Sodium 40 Mg  Tbec (Pantoprazole Sodium) .... Take One 30-60 Min Before First and Last Meals of The Day 3)  Pepcid Ac Maximum Strength 20 Mg Tabs (Famotidine) .... One At Bedtime 4)  B Complex  Tabs (B Complex Vitamins) .... Once Daily 5)  Omnaris 50 Mcg/act Susp (Ciclesonide) .... Apply Twice Daily 6)  Px Fiber 0.52 Gm Caps (Psyllium) .... Take 2 Capsules Two Times A Day 7)  Caltrate 600+d 600-400 Mg-Unit  Tabs (Calcium Carbonate-Vitamin D) .... Take 1 Tablet By Mouth Once A Day 8)  Vitamin C 500 Mg Tabs (Ascorbic Acid) .[name].. 1 Once Daily 9)  Tussin Cough 15 Mg Caps (Dextromethorphan Hbr) .... Use 2 Caps By Mouth Every 6 Hours As Needed 10)  Tramadol Hcl 50 Mg  Tabs (Tramadol Hcl) .... One To Two By Mouth Every 4-6 Hours As Needed For Pain or Cough 11)  Chlor-Trimeton 4 Mg Tabs (Chlorpheniramine Maleate) .... One Every 6 Hours As Needed For Congestion or Drainage 12)  Ibuprofen 200 Mg Tabs (Ibuprofen) .... As Directed On Bottle As Needed 13)  Advil Cold & Sinus Liqui-Gels 30-200 Mg Caps (Pseudoephedrine-Ibuprofen) .... As Directed As Needed 14)  Afrin Nasal Spray 0.05 % Soln (Oxymetazoline Hcl) .... As Directed As Needed 15)  Saline Nasal Spray 0.65 % Soln (Saline) .... Use 2 Puffs Every 4 Hours As Needed 16)  Xanax 0.25 Mg Tabs (Alprazolam) .... 1/2 Once Daily As Needed 17)  Vitamin D (Ergocalciferol) 50000 Unit Caps (  Ergocalciferol) .... Once Capsule Twice Weekly X 12 Weeks  Current Medications (verified): 1)  Cymbalta 60 Mg Cpep (Duloxetine Hcl) .[name].. 1 By Mouth Daily 2)  Pantoprazole Sodium 40 Mg  Tbec (Pantoprazole Sodium) .... Take One 30-60 Min Before First and Last Meals of The Day 3)  Pepcid Ac Maximum Strength 20 Mg Tabs (Famotidine) .... One At Bedtime 4)  B Complex  Tabs (B Complex Vitamins) .... Once Daily 5)  Omnaris 50 Mcg/act Susp (Ciclesonide) .... Apply Twice Daily 6)  Px Fiber 0.52 Gm Caps  (Psyllium) .... Take 2 Capsules Two Times A Day 7)  Caltrate 600+d 600-400 Mg-Unit  Tabs (Calcium Carbonate-Vitamin D) .... Take 1 Tablet By Mouth Once A Day 8)  Vitamin C 500 Mg Tabs (Ascorbic Acid) .[name].. 1 Once Daily 9)  Tussin Cough 15 Mg Caps (Dextromethorphan Hbr) .... Use 2 Caps By Mouth Every 6 Hours As Needed 10)  Tramadol Hcl 50 Mg  Tabs (Tramadol Hcl) .... One To Two By Mouth Every 4-6 Hours As Needed For Pain or Cough 11)  Chlor-Trimeton 4 Mg Tabs (Chlorpheniramine Maleate) .... One Every 6 Hours As Needed For Congestion or Drainage 12)  Ibuprofen 200 Mg Tabs (Ibuprofen) .... As Directed On Bottle As Needed 13)  Advil Cold & Sinus Liqui-Gels 30-200 Mg Caps (Pseudoephedrine-Ibuprofen) .... As Directed As Needed 14)  Afrin Nasal Spray 0.05 % Soln (Oxymetazoline Hcl) .... As Directed As Needed 15)  Saline Nasal Spray 0.65 % Soln (Saline) .... Use 2 Puffs Every 4 Hours As Needed 16)  Xanax 0.25 Mg Tabs (Alprazolam) .... 1/2 Once Daily As Needed 17)  Vitamin D (Ergocalciferol) 50000 Unit Caps (Ergocalciferol) .... Once Capsule Twice Weekly X 12 Weeks  Allergies (verified): 1)  Epinephrine (Epinephrine)  Past History:  Past Medical History: Last updated: 06/03/2010 HIATAL HERNIA (ICD-553.3) with GERD      - H/o Barretts esophagus       - EGD pos HH/ stricture 04/18/10 ,  neg barrett's RHINITIS, CHRONIC (ICD-472.0)     - Remote allergy testing = molds/dust only     - Sinus CT 09/06/08  mild thickending only OBSTRUCTIVE SLEEP APNEA (ICD-327.23)..................................................[name] --PSG 03/31/02 AHI 64 --Nocturnal BIPAP MORBID OBESITY (ICD-278.01)     - Target wt  =  158  for BMI < 30  POSITIVE PPD (ICD-795.5) COUGH (ICD-786.2) DIVERTICULOSIS HEALTH MAINTENANCE........................................................Marland KitchenWert    - DT May 23, 2009     - Pneumovax 9/07    - CPX June 03, 2010    - GYN Levoix  Complex med regimen -------Meds reviewed with pt  education and computerized med calendar completed September 10, 2009   Past Surgical History: Last updated: 11/05/2009 Csection x two Cholecystectomy-1985 Right foot fracture Morton neuroma left foot Tubal Ligation 1981 Right arm surgery Dec 15th 2010  Family History: Last updated: 06/03/2010 Father - CAD, ? 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  Married.  2 Kids. Never smoker,    but Husband smokes Illicit Drug Use - no Patient does not get regular exercise.  No etoh  Risk Factors: Smoking Status: never (11/04/2007)  Review of Systems      See HPI  Vital Signs:  Patient profile:   64 year  old female Height:      62 inches Weight:      235.13 pounds BMI:     43.16 O2 Sat:      97 % on Room air Temp:     97.4 degrees F oral Pulse rate:   54 / minute BP sitting:   132 / 74  (left arm) Cuff size:   large  Vitals Entered By: Boone Master CNA/MA (August 04, 2010 4:06 PM)  O2 Flow:  Room air  Physical Exam  Additional Exam:  wt  224 May 23, 2009  > 226 September 10, 2009 > 229 November 05, 2009 > 232 June 03, 2010 >235 August 04, 2010  Ambulatory  wf in no acute distress. HEENT: nl dentition,  and orophanx. Nl external ear canals without cough reflex, elongated uvula, thick tongue.  Mod bilateral  turbinate edema mucoid secretions/ crusting moderate also bilateral Neck without JVD/Nodes/TM Lungs clear to A and P bilaterally without cough on insp or exp maneuvers RRR no s3 or murmur or increase in P2.  No edema Abd soft and benign with nl excursion in the supine position. No bruits or organomegaly Ext warm without calf tenderness, cyanosis clubbing Skin warm and dry  without lesions   Neuro alert, nl sensorium,  no motor, cerebellar def MS  Nl gait, tender along bilateral lumbar/sacral region, no rash, neg SLR, equal strength bilaterally.    Impression & Recommendations:  Problem # 1:  BACK STRAIN, LUMBAR (ICD-847.2)  Stop Ibuprofen.  Begin Mobic 15mg  once daily w/ food for joint/back pain. as needed  Skelaxin 800mg  three times a day as needed muscle spasm.  Tramadol 50mg  every 4-6 hrs as needed pain .  Alternate ice and heat.  Please contact office for sooner follow up if symptoms do not improve or worsen  Orders: Est. Patient Level III (04540)  Medications Added to Medication List This Visit: 1)  Mobic 15 Mg Tabs (Meloxicam) .[name].. 1 by mouth once daily w/ food  Patient Instructions: 1)  Stop Ibuprofen.  2)  Begin Mobic 15mg  once daily w/ food for joint/back pain. as needed  3)  Skelaxin 800mg  three times a day as needed muscle spasm.  4)  Tramadol 50mg  every 4-6 hrs as needed pain .  5)  Alternate ice and heat.  6)  Please contact office for sooner follow up if symptoms do not improve or worsen Prescriptions: TRAMADOL HCL 50 MG  TABS (TRAMADOL HCL) One to two by mouth every 4-6 hours as needed for pain or cough  #40 x 2   Entered and Authorized by:   Rubye Oaks [name]   Signed by:   Rubye Oaks [name] on 08/04/2010   Method used:   Electronically to        [facility]* (retail)       [address]       [place], [place]  [address]       Ph: [phone]       Fax: [phone]   RxID:   [id number] MOBIC 15 MG TABS (MELOXICAM) 1 by mouth once daily w/ food  #30 x 5   Entered and Authorized by:   Rubye Oaks [name]   Signed by:   Rubye Oaks [name] on 08/04/2010   Method used:   Electronically to        [facility]* (retail)       [phone]       [place], [place]  [address]       Ph: [phone]       Fax: [phone]   RxID:   [id number]    Immunization History:  Influenza Immunization History:     Influenza:  historical (05/24/2010)

## 2010-09-25 NOTE — Assessment & Plan Note (Signed)
Summary: f/u ///kp   Primary Provider/Referring Provider:  Dorthey Sawyer  CC:  Follow up. Pt states she uses her bpap everynight x4-6 hrs a night. Pt states she is having no problems with macine/mask. [name]  History of Present Illness: 82 yowf never smoker  with morbid obesity complicated by gastroesophageal reflux disease, and also a history of obstructive sleep apnea.  Her recent download showed good control with BPAP 14/9.  She however, was only using for about 2.5 hours per night.  She goes to bed at 10pm.  She wears her mask every night.  Her mask will come off during the night, but she is not aware of this until the next morning when she wakes up.  She gets out of bed at 530am.  She is feeling better since she dedicated more time to sleep.  She thinks her mask is slipping off during the night.        Current Medications (verified): 1)  Cymbalta 60 Mg Cpep (Duloxetine Hcl) .[name].. 1 By Mouth Daily 2)  Pantoprazole Sodium 40 Mg  Tbec (Pantoprazole Sodium) .... Take One 30-60 Min Before First and Last Meals of The Day 3)  Pepcid Ac Maximum Strength 20 Mg Tabs (Famotidine) .... One At Bedtime 4)  B Complex  Tabs (B Complex Vitamins) .... Once Daily 5)  Omnaris 50 Mcg/act Susp (Ciclesonide) .... Apply Twice Daily 6)  Px Fiber 0.52 Gm Caps (Psyllium) .... Take 2 Capsules Two Times A Day 7)  Caltrate 600+d 600-400 Mg-Unit  Tabs (Calcium Carbonate-Vitamin D) .... Take 1 Tablet By Mouth Once A Day 8)  Vitamin C 500 Mg Tabs (Ascorbic Acid) .[name].. 1 Once Daily 9)  Tussin Cough 15 Mg Caps (Dextromethorphan Hbr) .... Use 2 Caps By Mouth Every 6 Hours As Needed 10)  Tramadol Hcl 50 Mg  Tabs (Tramadol Hcl) .... One To Two By Mouth Every 4-6 Hours As Needed For Pain or Cough 11)  Chlor-Trimeton 4 Mg Tabs (Chlorpheniramine Maleate) .... One Every 6 Hours As Needed For Congestion or Drainage 12)  Mobic 15 Mg Tabs (Meloxicam) .[name].. 1 By Mouth Once Daily W/ Food 13)  Tylenol Cold Head Congestion 10-5-325  Mg Tabs (Dm-Phenylephrine-Acetaminophen) .... As Needed Per Bottle 14)  Afrin Nasal Spray 0.05 % Soln (Oxymetazoline Hcl) .... As Directed As Needed 15)  Saline Nasal Spray 0.65 % Soln (Saline) .... Use 2 Puffs Every 4 Hours As Needed 16)  Xanax 0.25 Mg Tabs (Alprazolam) .... 1/2 Once Daily As Needed 17)  Flexeril 5 Mg Tabs (Cyclobenzaprine Hcl) .... Take 1 Tablet By Mouth Three Times A Day As Needed For Muscle Spasms  Allergies (verified): 1)  Epinephrine (Epinephrine)  Past History:  Past Medical History: HIATAL HERNIA (ICD-553.3) with GERD      - H/o Barretts esophagus       - EGD pos HH/ stricture 04/18/10 ,  neg barrett's RHINITIS, CHRONIC (ICD-472.0)     - Remote allergy testing = molds/dust only     - Sinus CT 09/06/08  mild thickending only OBSTRUCTIVE SLEEP APNEA (ICD-327.23)..................................................[name] --PSG 03/31/02 AHI 64 --Nocturnal BIPAP 14/9 cm H2O MORBID OBESITY (ICD-278.01)     - Target wt  =  158  for BMI < 30  POSITIVE PPD (ICD-795.5) COUGH (ICD-786.2) DIVERTICULOSIS HEALTH MAINTENANCE........................................................Marland KitchenWert    - DT May 23, 2009     - Pneumovax 9/07    - CPX June 03, 2010    - GYN Levoix  Complex med regimen -------Meds reviewed with pt education and  computerized med calendar completed September 10, 2009   Past Surgical History: Reviewed history from 11/05/2009 and no changes required. Csection x two Cholecystectomy-1985 Right foot fracture Morton neuroma left foot Tubal Ligation 1981 Right arm surgery Dec 15th 2010  Vital Signs:  Patient profile:   65 year old female Height:      62 inches Weight:      235.38 pounds BMI:     43.21 O2 Sat:      93 % on Room air Temp:     97.8 degrees F oral Pulse rate:   68 / minute BP sitting:   138 / 80  (left arm) Cuff size:   large  Vitals Entered By: Carver Fila (September 10, 2010 3:15 PM)  O2 Flow:  Room air CC: Follow up. Pt states  she uses her bpap everynight x4-6 hrs a night. Pt states she is having no problems with macine/mask.  Comments meds and allergies updated Phone number updated Carver Fila  September 10, 2010 3:15 PM    Physical Exam  General:  normal appearance, healthy appearing, and obese.   Nose:  clear nasal discharge, no sinus tenderness Mouth:  MP 3, no exudate Neck:  no JVD.   Lungs:  clear bilaterally to auscultation and percussion Heart:  regular rate and rhythm, S1, S2 without murmurs, rubs, gallops, or clicks Extremities:  no clubbing, cyanosis, edema, or deformity noted Neurologic:  normal CN II-XII and strength normal.   Cervical Nodes:  no significant adenopathy Psych:  alert and cooperative; normal mood and affect; normal attention span and concentration   Impression & Recommendations:  Problem # 1:  OBSTRUCTIVE SLEEP APNEA (ICD-327.23)  She has done well with BPAP.  Advised her to contact her DME to check her mask fit.  Encouraged her to use her BPAP for the entire time she is asleep to get maximal benefit.  Medications Added to Medication List This Visit: 1)  Tylenol Cold Head Congestion 10-5-325 Mg Tabs (Dm-phenylephrine-acetaminophen) .... As needed per bottle  Complete Medication List: 1)  Cymbalta 60 Mg Cpep (Duloxetine hcl) .[name].. 1 by mouth daily 2)  Pantoprazole Sodium 40 Mg Tbec (Pantoprazole sodium) .... Take one 30-60 min before first and last meals of the day 3)  Pepcid Ac Maximum Strength 20 Mg Tabs (Famotidine) .... One at bedtime 4)  B Complex Tabs (B complex vitamins) .... Once daily 5)  Omnaris 50 Mcg/act Susp (Ciclesonide) .... Apply twice daily 6)  Px Fiber 0.52 Gm Caps (Psyllium) .... Take 2 capsules two times a day 7)  Caltrate 600+d 600-400 Mg-unit Tabs (Calcium carbonate-vitamin d) .... Take 1 tablet by mouth once a day 8)  Vitamin C 500 Mg Tabs (Ascorbic acid) .[name].. 1 once daily 9)  Tussin Cough 15 Mg Caps (Dextromethorphan hbr) .... Use 2 caps by mouth every 6  hours as needed 10)  Tramadol Hcl 50 Mg Tabs (Tramadol hcl) .... One to two by mouth every 4-6 hours as needed for pain or cough 11)  Chlor-trimeton 4 Mg Tabs (Chlorpheniramine maleate) .... One every 6 hours as needed for congestion or drainage 12)  Mobic 15 Mg Tabs (Meloxicam) .[name].. 1 by mouth once daily w/ food 13)  Tylenol Cold Head Congestion 10-5-325 Mg Tabs (Dm-phenylephrine-acetaminophen) .... As needed per bottle 14)  Afrin Nasal Spray 0.05 % Soln (Oxymetazoline hcl) .... As directed as needed 15)  Saline Nasal Spray 0.65 % Soln (Saline) .... Use 2 puffs every 4 hours as needed 16)  Xanax 0.25  Mg Tabs (Alprazolam) .... 1/2 once daily as needed 17)  Flexeril 5 Mg Tabs (Cyclobenzaprine hcl) .... Take 1 tablet by mouth three times a day as needed for muscle spasms  Other Orders: Est. Patient Level III ([address])  Patient Instructions: 1)  Follow up in one year

## 2010-09-26 NOTE — Letter (Signed)
Summary: Medical clearance/[facility]  Medical clearance/[facility]   Imported By: Lester [place] 09/12/2009 10:57:00  _____________________________________________________________________  External Attachment:    Type:   Image     Comment:   External Document

## 2010-10-01 NOTE — Progress Notes (Signed)
Summary: vitamin d lab results from [facility] > done  Phone Note Call from Patient Call back at [phone] ext [phone]   Caller: Patient Call For: wert Reason for Call: Lab or Test Results Summary of Call: Pt requests lab results. Initial call taken by: Darletta Moll,  September 16, 2010 9:10 AM  Follow-up for Phone Call        Labs not found.  Pt to have [facility] lab to fax results to machine in triage room.  Will await results. Abigail Miyamoto RN  September 16, 2010 10:37 AM   [facility] and/or have [facility] lab refax lab drawn there. Per Verlon Au, results have not been received. Triage fax # given.Michel Bickers CMA  September 17, 2010 9:00 AM  Additional Follow-up for Phone Call Additional follow up Details #1::        Pt states she is waiting on the lab to call her back re: labs. She will call our office once she knows the labs have been faxed.Michel Bickers [facility]  September 17, 2010 10:13 AM Patient phoned stated that she is not having any luck with getting the labs faxed patient wants to know if our office can call and get the results. The lab number [phone] ext [phone]. Marland KitchenAudris Speaker  September 18, 2010 2:24 PM  Patient called back stating that she was returning a call she can be reached at [phone]. Marland KitchenMendy Chou  September 19, 2010 10:38 AM    Additional Follow-up for Phone Call Additional follow up Details #2::    Called and spoke with Scarlette Calico in the lab at [facility] and am awaiting results to be sent to the triage fax number. [name]  September 18, 2010 2:34 PM'  Additional Follow-up for Phone Call Additional follow up Details #3:: Details for Additional Follow-up Action Taken: Results in [facility] lookat for review. Pls advise thanks! [name]  September 18, 2010 5:30 PM Level 50 so now just needs to maintain about 800 units per day as a maintenance which is found is most multipurpose otc vitamins (will need to read the bottle to be sure) Additional Follow-up by: Nyoka Cowden MD,   September 19, 2010 9:00 AM  LMOMTCB x 1. Abigail Miyamoto RN  September 19, 2010 9:50 AM   called spoke with patient, advised of vitamin d results as stated by MW above.  pt verbalized her understanding. Boone Master CNA/MA  September 22, 2010 12:49 PM

## 2010-10-01 NOTE — Letter (Signed)
Summary: [facility]  [facility]   Imported By: Lester [place] 09/26/2010 09:58:17  _____________________________________________________________________  External Attachment:    Type:   Image     Comment:   External Document

## 2010-11-10 ENCOUNTER — Telehealth (INDEPENDENT_AMBULATORY_CARE_PROVIDER_SITE_OTHER): Payer: Self-pay | Admitting: *Deleted

## 2010-11-11 ENCOUNTER — Encounter: Payer: Self-pay | Admitting: Internal Medicine

## 2010-11-11 ENCOUNTER — Telehealth: Payer: Self-pay | Admitting: *Deleted

## 2010-11-11 ENCOUNTER — Telehealth: Payer: Self-pay | Admitting: Internal Medicine

## 2010-11-11 MED ORDER — MOMETASONE FUROATE 50 MCG/ACT NA SUSP: 2.0000 | Freq: Every day | NASAL | Status: DC

## 2010-11-11 NOTE — Telephone Encounter (Signed)
LMOMTCB

## 2010-11-11 NOTE — Telephone Encounter (Signed)
Per Dr [name] okay for her to take the nasonex. Will send to pharm.

## 2010-11-11 NOTE — Telephone Encounter (Signed)
Addended by: [name] on: 11/11/2010 04:00 PM   Modules accepted: Orders

## 2010-11-11 NOTE — Telephone Encounter (Signed)
Spoke with pt.  She is requesting a substitute for omnaris.  She states that she found out that her ins will cover nasonex and she is requesting that we call this is to [facility] in [place]. Pls advise thanks

## 2010-11-11 NOTE — Telephone Encounter (Signed)
  Caller: Patient Call For: wert Summary of Call: Pt states that her pharmacy no longer carries omnaris and her insurance no longer covers it, requests substitute.//[facility] [place] Initial call taken by: Darletta Moll,  November 10, 2010 10:22 AM  Follow-up for Phone Call         [facility] for pt to check with insurance company to see what would be covered to replace Omnaris. Abigail Miyamoto RN  November 10, 2010 12:35 PM   lmomtcb x2 Carver Fila  November 11, 2010 6:14 PM

## 2010-11-11 NOTE — Telephone Encounter (Signed)
Ok to [place] to [employer] [facility] [employer]

## 2010-11-12 NOTE — Telephone Encounter (Signed)
Spoke with pt and she states that Nasonex is covered. Please advise if ok to change omnaris to Nasonex. Thanks. Carron Curie, MA

## 2010-11-12 NOTE — Telephone Encounter (Signed)
Ok to change to nasonex

## 2010-11-12 NOTE — Telephone Encounter (Signed)
See previous encounter

## 2010-11-20 NOTE — Progress Notes (Signed)
Summary: medication issue  Phone Note Call from Patient Call back at [phone]   Caller: Patient Call For: wert Summary of Call: Pt states that her pharmacy no longer carries omnaris and her insurance no longer covers it, requests substitute.//[facility] [place] Initial call taken by: Darletta Moll,  November 10, 2010 10:22 AM  Follow-up for Phone Call        [facility] for pt to check with insurance company to see what would be covered to replace Omnaris. Abigail Miyamoto RN  November 10, 2010 12:35 PM   lmomtcb x2 Carver Fila  November 11, 2010 6:14 PM   see epic Carver Fila  November 11, 2010 6:16 PM

## 2010-12-04 LAB — DIFFERENTIAL
Basophils Absolute: 0.3 10*3/uL — ABNORMAL HIGH (ref 0.0–0.1)
Basophils Relative: 2 % — ABNORMAL HIGH (ref 0–1)
Eosinophils Absolute: 0.1 10*3/uL (ref 0.0–0.7)
Monocytes Absolute: 0.7 10*3/uL (ref 0.1–1.0)
Neutro Abs: 10.4 10*3/uL — ABNORMAL HIGH (ref 1.7–7.7)
Neutrophils Relative %: 78 % — ABNORMAL HIGH (ref 43–77)

## 2010-12-04 LAB — CBC
Hemoglobin: 13.8 g/dL (ref 12.0–15.0)
RBC: 4.49 MIL/uL (ref 3.87–5.11)
RDW: 13.4 % (ref 11.5–15.5)
WBC: 13.4 10*3/uL — ABNORMAL HIGH (ref 4.0–10.5)

## 2010-12-04 LAB — COMPREHENSIVE METABOLIC PANEL
ALT: 27 U/L (ref 0–35)
Alkaline Phosphatase: 72 U/L (ref 39–117)
Chloride: 107 mEq/L (ref 96–112)
Glucose, Bld: 127 mg/dL — ABNORMAL HIGH (ref 70–99)
Potassium: 3.9 mEq/L (ref 3.5–5.1)
Sodium: 141 mEq/L (ref 135–145)
Total Bilirubin: 0.6 mg/dL (ref 0.3–1.2)
Total Protein: 6.6 g/dL (ref 6.0–8.3)

## 2010-12-04 LAB — URINALYSIS, ROUTINE W REFLEX MICROSCOPIC
Bilirubin Urine: NEGATIVE
Glucose, UA: NEGATIVE mg/dL
Ketones, ur: NEGATIVE mg/dL
Specific Gravity, Urine: 1.016 (ref 1.005–1.030)
pH: 7 (ref 5.0–8.0)

## 2010-12-04 LAB — URINE MICROSCOPIC-ADD ON

## 2010-12-04 LAB — LIPASE, BLOOD: Lipase: 18 U/L (ref 11–59)

## 2010-12-17 ENCOUNTER — Telehealth: Payer: Self-pay | Admitting: Internal Medicine

## 2010-12-17 NOTE — Telephone Encounter (Signed)
lmomtcb x1 

## 2010-12-17 NOTE — Telephone Encounter (Addendum)
Called, spoke with pt.  She was informed MW would prefer she get these meds from Dr. Evelene Croon.  She is ok with this and verbalized understanding.

## 2010-12-17 NOTE — Telephone Encounter (Signed)
Called, spoke with pt. States Dr. Evelene Croon writes prescriptions for cymbalta and xanax.  She would like to know if MW will start writing these for her since he is her PCP or should she continue seeing Dr. Evelene Croon for these rxs.  Aware MW out of office until tomorrow and ok with this.  Dr. [name], pls advise.

## 2010-12-17 NOTE — Telephone Encounter (Signed)
Strongly prefer [name] since xanax has the potential for addiction and requires more attention than just hitting the refill button

## 2010-12-17 NOTE — Telephone Encounter (Signed)
Pt returning call can be reached at [phone].Darletta Moll

## 2011-01-06 NOTE — Assessment & Plan Note (Signed)
[facility]                             PULMONARY OFFICE NOTE   NAME:JOHNSONPriseis, [name]                      MRN:          [id number]  DATE:01/24/2007                            DOB:          Jan 12, 1946    HISTORY OF PRESENT ILLNESS:  The patient is a pleasant, 65 year old,  white female patient of Dr. [name] with a known history of morbid obesity  complicated by gastroesophageal reflux disease and chronic cough who  presents today for an acute office visit.  The patient complains that 2  days ago, she had some mid lower back pain that feels as if she has  pulled a muscle.  The patient complains pain increases with movement,  trying to stand up and bending.  The patient had been doing some heavy  lifting over the weekend picking her grandson in and out of the crib.  The patient denies any known injury, chest pain, shortness of breath,  palpitations, presyncopal or syncopal episodes, urinary or bowel  incontinence, extremity weakness or radicular symptoms.   PAST MEDICAL HISTORY:  Reviewed.   CURRENT MEDICATIONS:  Reviewed.   PHYSICAL EXAMINATION:  GENERAL:  The patient is a pleasant female in no  acute distress.  VITAL SIGNS:  She is afebrile with stable vital signs.  O2 saturations  97% on room air.  HEENT:  Unremarkable.  NECK:  Supple without cervical adenopathy.  No JVD.  LUNGS:  Clear bilaterally.  CARDIAC:  S1, S2 without murmur or gallop.  ABDOMEN:  Soft and nontender with palpable hepatosplenomegaly.  No  bruits noted.  EXTREMITIES:  Warm without any calf cyanosis, clubbing or edema.  Moves  all extremities well.  Equal strength of the upper and lower  extremities.  NEUROLOGIC:  DTRs are 2+ throughout.  Negative straight leg raises.  BACK:  Along the mid lumbosacral region, the patient had some tenderness  on palpation.   IMPRESSION/PLAN:  Lumbosacral strain.  The patient is to begin Motrin  600 mg every 8 hours x7-10 days with food.  May  use Skelaxin 800 mg up  to three times a day as needed for muscle spasm.  Vicodin #20, one every  4 hours as needed.  The patient will alternate ice and heat to  the area.  The patient will return back here for followup.  The patient  is to contact the office sooner if symptoms worsen.      Rubye Oaks, NP  Electronically Signed      Charlaine Dalton. [name], MD, [facility]  Electronically Signed   TP/MedQ  DD: 01/24/2007  DT: 01/25/2007  Job #: [id number]

## 2011-01-06 NOTE — Procedures (Signed)
NAME:  [name], [name] NO.:  [id number]   MEDICAL RECORD NO.:  [id number]          PATIENT TYPE:  OUT   LOCATION:  SLEEP CENTER                 FACILITY:  [facility]   PHYSICIAN:  Coralyn Helling, MD        DATE OF BIRTH:  August 20, 1946   DATE OF STUDY:  12/01/2007                            NOCTURNAL POLYSOMNOGRAM   REFERRING PHYSICIAN:  Coralyn Helling, MD   INDICATION:  Ms. Taylor is a 65 year old female who has a diagnosis of  obstructive sleep apnea.  She had a diagnostic study on March 31, 2002,  and was found to have severe obstructive sleep apnea with an apnea-  hypopnea index of 64.  She had been tried on CPAP, as well as BiPAP as  an outpatient but continued to have difficulties with her sleep.  She  was referred to the sleep lab for BiPAP titration study.   Height is 5 feet 2 inches tall.  Weight is 227 pounds.  BMI is 42.  Neck  size is 16.5.   EPWORTH SCORE:  3   MEDICATIONS:  1. Protonix.  2. Nasonex.  3. Ibuprofen.  4. Cymbalta.  5. NyQuil.   SLEEP ARCHITECTURE:  Total sleep period was 395 minutes.  Total sleep  time was 363 minutes.  Sleep efficiency was 89%.  Sleep latency is 10.5.  REM latency is 58.5 minutes.  The patient was observed in all stages of  sleep.  The patient slept in both the supine and nonsupine position.   RESPIRATORY DATA:  The average respiratory rate was 18.  The patient was  titrated from a BiPAP pressure setting of 8/5 to 23/16 at a BiPAP  pressure setting of 14/9.  The patient appeared to have improvement in  her sleep quality, as well as her respiratory events.  She continued to  have some respiratory events at this pressure setting but they were  mostly related to central apneic events which were actually much more  common at higher pressures and sporadic episodes of obstructive  hypopneas but it appeared that her obstructive apneas were well  controlled and snoring was eliminated.  At this pressure, she was  observed in both  supine sleep and REM sleep.   OXYGEN DATA:  The baseline oxygenation was 94%.  The oxygen saturation  nadir was 76%.  At a BiPAP pressure setting of 14/9, the oxygen  saturation nadir was 85%.  The study was done without the use of  supplemental oxygen.   CARDIAC DATA:  The average heart rate was 61 and the rhythm strip showed  normal sinus rhythm.   MOVEMENT-PARASOMNIA:  The periodic leg movement index was 0.   IMPRESSION:  This was a BiPAP titration study.  At a BiPAP pressure  setting of 14/9, it appeared the patient had optimal balance between  improvement of sleep architecture, as well as control of respiratory  events.  Of note, is at higher pressures she appeared to have more  frequent central apneic events.  At the BiPAP pressure setting of 14/9,  she was observed in both REM sleep and supine sleep and snoring was  eliminated.  I  would recommend that she start on BiPAP at 14/9 and then  monitor for her clinical response.  She may also need to have an  overnight oximetry done once she is established on BiPAP therapy.      Coralyn Helling, MD  Diplomat, American Board of Sleep Medicine  Electronically Signed     VS/MEDQ  D:  12/15/2007 14:16:35  T:  12/15/2007 14:51:40  Job:  [id number]

## 2011-01-06 NOTE — Assessment & Plan Note (Signed)
[facility]                             PULMONARY OFFICE NOTE   NAME:JOHNSONAnahy, Esh                      MRN:          [id number]  DATE:05/16/2007                            DOB:          April 26, 1946    HISTORY:  A 65 year old white female with morbid obesity complicated by  gastroesophageal reflux disease, and also a history of obstructive sleep  apnea, but unable to tolerate CPAP.  She presently is sleeping better  and denies any significant headache or excessive daytime  hypersomnolence.  She is actively trying to lose weight, and states she  has been exercising up to 30 minutes daily, and, in fact, is making very  slow progress toward a goal of a target weight of less than 158.  She  denies any exertional chest pain, orthopnea, PND, TIA or claudication  symptoms.   PAST MEDICAL HISTORY:  1. Gastroesophageal reflux disease complicated by Barrett's      esophagitis, documented in October of 2006.  2. Cyclical cough related to rhinitis and gastroesophageal reflux      disease with adequate control on present regimen.  3. Positive PPD in 1977, never treated.  4. Status post cholecystectomy in 1985.  5. Status post remote tubal ligation.   ALLERGIES:  EPINEPHRINE causes nonspecific reactions.   MEDICATIONS:  1. Caltrate.  2. Cymbalta.  3. Protonix.  4. Nasonex.  5. Xanax p.r.n.   For full details, see medication patient sheet, column dated May 16, 2007, which was reviewed with the patient in detail.   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  She has never smoked and denies any unusual exposure history.   FAMILY HISTORY:  Positive for emphysema and lung cancer in paternal  grandfather who was a smoker.  Positive for allergies in her mother,  father developed heart disease in his 82s.  Mother developed Alzheimer's  disease in her late 84s.  Siblings are okay except for hypertension.   REVIEW OF SYSTEMS:   Taken in detail on the worksheet, negative except as  outlined above.   PHYSICAL EXAMINATION:  This is an obese, ambulatory white female in no  acute distress, weighing 219 pounds, down 2 pounds from a year ago.  Height is unchanged at 5 feet 2-1/2 inches.  HEENT:  Ocular exam was done with a limited funduscopy revealing no  significant retinal arterial changes.  Oropharynx was clear.  Dentition  was intact.  NECK:  Supple without cervical adenopathy or tenderness.  Ear canals were clear bilaterally.  Carotid upstrokes were brisk without any bruits.  CHEST:  Completely clear bilaterally to auscultation and percussion.  BREASTS:  Without masses, nipple, or skin changes.  ABDOMEN:  Soft, benign with no palpable organomegaly or masses or  tenderness.  EXTREMITIES:  Warm without calf tenderness, cyanosis, clubbing, or  edema.  Pedal pulses were symmetric bilaterally.  NEUROLOGIC:  No focal deficits, pathologic reflexes.  SKIN EXAM:  Warm and dry.   EKG was normal.   LABORATORY DATA:  Included a normal CBC, a total chemistry profile and  TSH.  IMPRESSION:  1. Morbid obesity complicated by hyperlipidemia in the past.  I had      intended to do a lipid profile, but this was not done.  She will      need to have this monitored within the next year and work hard, in      the meantime, on calorie balance issues with a target weight      discussed with her.  2. Health maintenance.  She was updated on tetanus in 2000, Pneumovax      in 2007, and gets all of her gynecological care through Dr.      Sharol Roussel office.  3. Gastroesophageal reflux disease with stricture.  She needs to      maintain Protonix perfectly regularly as outlined above, and      followup per Dr. Russella Dar.  4. Chronic rhinitis, adequately controlled on Nasonex.  5. Obstructive sleep apnea, no longer symptomatic.  Not able, however,      to tolerate continuous positive airway pressure.     Charlaine Dalton. [name], MD, [facility]   Electronically Signed    MBW/MedQ  DD: 05/17/2007  DT: 05/18/2007  Job #: [id number]

## 2011-01-09 NOTE — Assessment & Plan Note (Signed)
[facility]                             PULMONARY OFFICE NOTE   NAME:[name], [name]                      MRN:          [id number]  DATE:07/27/2006                            DOB:          [birth year]    HISTORY:  A 65 year old white female with morbid obesity and documented  GERD with Barrett's in today with refractory hoarseness, dry cough and  sore throat over the last 3 weeks associated with mild nasal congestion.  She has had no purulent sputum or fever, itching, sneezing or subjective  wheezing.   On her most recent evaluation, I wondered whether reflux might be an  issue with this patient on Actonel with documented GERD but she has not  been back to Dr. Seymour [name] to consider alternative therapy or the  cost/benefit ratio of Actonel in the past with Barrett's. For a full  inventory of medications please see face sheet, dated July 27, 2006.   PHYSICAL EXAMINATION:  GENERAL:  She is a very hoarse, ambulatory, white  female in no acute distress.  VITAL SIGNS:  She had stable vital signs.  HEENT:  Reveals minimal turbinate edema. The oropharynx is clear. There  is no evidence of erythema, excessive post nasal drainage or  cobblestoning.  NECK:  Supple without cervical adenopathy or tenderness. The trachea is  midline, no thyromegaly.  LUNGS:  Lung fields perfectly clear bilaterally to auscultation and  percussion with excellent air movement.  HEART:  Regular rate and rhythm without murmur, gallop or rub.  ABDOMEN:  Soft and benign.  EXTREMITIES:  Warm without calf tenderness, cyanosis, clubbing or edema.   IMPRESSION:  Severe hoarseness dating back now several weeks in the  setting of nonspecific rhinitis in a patient with documented  gastroesophageal reflux disease with Barrett's on Actonel.   The differential diagnosis therefore includes a viral upper respiratory  infection or allergic mechanism that exacerbated coughing which  exacerbated  reflux. I recommended empirically a 7-day course of  doxicycline taken with a glass of water before meals and elimination of  the symptom of cough (which may be driving more reflux) and robitussin 2  teaspoons every 4 hours.   To treat the inflammatory component acutely, I recommended prednisone 10  mg #14 to be tapered off over 6 days.   To address the issue long-term of reflux, I recommended again that she  use Protonix 30 minutes before meals twice daily perfectly regular and  observe the diet that was recommended previously which includes the  avoidance of menthol lozenges and followup with Dr. Russella [name].   Long-term I do not believe this patient will be able to continue to use  Actonel given the frequency severity of exacerbations of hoarseness and  the fact that she has Barrett's  esophagitis. I have recommended for now stopping the Actonel until she  has a chance to discuss the risks, benefits, and alternatives with Dr.  Seymour [name].     [name]. [name] Sires, MD, [facility]  Electronically Signed    MBW/MedQ  DD: 07/27/2006  DT: 07/28/2006  Job #: [id number]   cc:  [name], M.D.

## 2011-01-09 NOTE — Assessment & Plan Note (Signed)
[facility]                             PULMONARY OFFICE NOTE   NAME:[name] [name], [name]                      MRN:          [id number]  DATE:12/08/2006                            DOB:          1945/12/25    HISTORY:  A 65 year old white female with morbid obesity complicated by  gastroesophageal reflux disease and tendency to chronic cough. She comes  back all smiles today doing much better on her present regimen which  consists of Nexium dosed 40 mg b.i.d. because she has gastroesophageal  reflux disease. I predict she has Barrett's and also extra esophageal  symptoms. She has not been back to Dr. Russella Dar recently for followup.   For full inventory on medications, please see face sheet dated December 08, 2006; is correct as listed.   PHYSICAL EXAMINATION:  She is a pleasant, obese, ambulatory white female  in no acute distress weighing 219 pounds which is no change in baseline.  HEENT: Is unremarkable. Oropharynx is clear.  LUNGS: Lung fields are completely clear bilaterally to auscultation and  percussion.  HEART: Regular rate and rhythm without murmur, gallop or rub.  ABDOMEN: Soft, benign.  EXTREMITIES: Warm without calf tenderness, cyanosis, clubbing or edema.   IMPRESSION:  Obesity complicated by gastroesophageal reflux disease. I  discussed the diet issues with her in detail emphasizing that she has a  target weight of less than 158 with an ideal weight of 142 and needs to  progress toward goal by better calorie balance.   In the meantime, she also has Barrett's and will need regular followup  with Dr. Russella Dar. Apparently, she is having trouble with the insurance  company being approved for the high dose of Nexium, but since this is a  GI issue that needs regular followup with Dr. Russella Dar I am going to refer  management of this problem including all refills to his judgment so that  he can keep up also with the EGD surveillance issues.     Charlaine Dalton. [name], MD, [facility]  Electronically Signed    MBW/MedQ  DD: 12/08/2006  DT: 12/08/2006  Job #: [id number]

## 2011-01-09 NOTE — Assessment & Plan Note (Signed)
[facility]                             PULMONARY OFFICE NOTE   NAME:JOHNSONLadaisha, Portillo                      MRN:          [id number]  DATE:11/16/2006                            DOB:          1945/11/07    HISTORY OF PRESENT ILLNESS:  Patient is a 65 year old white female  patient of Dr. Thurston Hole who has a known history of gastroesophageal reflux  with Barrett's and presents today for an acute office visit. Patient  complains over the past 2 days that she has had sudden onset of loose  watery, diarrhea with multiple episodes. Patient complains that she has  approximately 2 to 3 stools and hour with lower intermittent abdominal  cramps, gas, bloating. Patient also complains of some upper abdomen  distension. Along with fever, chills, and weakness. Patient has had  decreased appetite, however, has been drinking, however as soon as she  drinks any fluids or tries to eat she seems to have worsening diarrhea.  Patient has recently been on antibiotics, 2 separate courses of  antibiotics. Initially was treated for an acute upper respiratory  infection with Avalox 2 weeks ago and then had subsequent dental work 5  days ago and has been on a 5 day course of penicillin. Patient denies  any bloody stools, chest pain, shortness of breath, urinary symptoms.   PAST MEDICAL HISTORY:  Reviewed.   CURRENT MEDICATIONS:  Reviewed.   PHYSICAL EXAMINATION:  Patient is a pleasant female in no acute  distress. She is afebrile with stable vital signs. O2 saturation is 97%  on room air. Weight is down 8 pounds at 218.  HEENT: Sclera nonicteric. Posterior pharynx clear.  NECK: Supple without cervical adenopathy. No JVD.  LUNG SOUNDS: Clear to auscultation bilaterally.  CARDIAC: S1, S2, without murmur, rub, or gallop.  ABDOMEN: Soft, obese, with positive bowel sounds throughout all 4  quadrants. No guarding or rebound noted.  Negative CVA tenderness.  EXTREMITIES: Warm  without any calf tenderness, cyanosis, clubbing, or  edema.   IMPRESSION AND PLAN:  Acute diarrhea, need to rule out Clostridium  difficile colitis. Patient has been on 3 separate antibiotics over the  last 3 months. Patient will stop penicillin at this time. I have  recommended that she begin Flagyl times 10 days. A Clostridium difficile  stool sample is pending. Patient is to increase fluid intake. In advance  a bland diet as tolerated. She is avoid using Imodium type products at  this time. And also avoid lactose products the next few days. She may  use Levsin as needed for abdominal cramping. Patient will recheck here  in 1 week or sooner if needed. Patient is advised if symptoms worsen, if  she becomes unable to eat or drink  or has bloody stools, or fever, or worsening symptoms, she is to contact  us immediately, or go to the closest emergency department.      Rubye Oaks, NP  Electronically Signed      Charlaine Dalton. [name], MD, [facility]  Electronically Signed   TP/MedQ  DD: 11/16/2006  DT: 11/16/2006  Job #: [id number]

## 2011-01-09 NOTE — Assessment & Plan Note (Signed)
[name]                             PULMONARY OFFICE NOTE   [name], [name]                      MRN:          [id number]  DATE:11/01/2006                            DOB:          10-02-1945    HISTORY:  This is a 65 year old white female with a history of  Barrett's, morbid obesity complicated by GERD and Barrett's, who states  she has not been better since her bad cold in November, 2007.  I had  given her multiple contingencies, which she has not consistently  followed, including increasing the PPI dose to b.i.d. and using Nasonex  to control rhinitis (a specific rhinitis flyer was reviewed with her,  which she has not been following).  Presently, she has a hacking cough  that is worse as the day goes on but also when she lies down at night,  which only seems better on Tussionex.   Presently, she is only on Protonix 1 daily and uses Nasonex p.r.n. and  did not respond to Zyrtec.   PHYSICAL EXAMINATION:  GENERAL:  She is a pleasant, ambulatory, obese  white female in no acute distress, weighing 224 pounds, which is no  change at baseline.  VITAL SIGNS:  She is afebrile with normal vital signs.  HEENT:  Moderate turbinate edema with nonspecific features.  Oropharynx  is clear with no evidence of excessive postnasal drainage or  cobblestoning.  Ear canals are clear bilaterally.  NECK:  Supple without cervical adenopathy or tinnitus.  Trachea is  midline.  No thyromegaly.  LUNGS:  Minimum pseudowheeze with no cough on inspiratory or expiratory  maneuvers.  HEART:  Regular rhythm without murmur, rub or gallop.  ABDOMEN:  Soft, benign.  EXTREMITIES:  Warm without calf tenderness, clubbing, cyanosis or edema.   IMPRESSION:  Chronic cough dating back to November, 2007.  It either  represents rhinitis, reflux, or both.  Note that reflux can cause  secondary rhinitis and needs to be treated more aggressively, especially  since this patient  already has documented gastroesophageal reflux  disease.   I also reviewed a chronic rhinitis flyer with her, emphasized that the  Nasonex is not p.r.n. but to be used b.i.d. until better and then  tapered down to nightly.  Secondly, I reviewed a GERD flyer with her,  including appropriate dietary modifications.  The recommendation that  for now she stay on Protonix on a b.i.d., not p.r.n. basis.  Finally, to  control the cycle of cough, I recommended Delsym supplemented with  Tramadol on a p.r.n. basis.   If she does not respond to the above regimen, the next step is a sinus  CT scan, followed perhaps, by a methacholine challenge and/or empiric  trial of Reglan added to a high dose PPI therapy.   She was provided with samples of Protonix and Nasonex for the purpose of  a therapeutic trial.  Return in four weeks to report response or lack  thereof.     Charlaine Dalton. [name], MD, [facility]  Electronically Signed    MBW/MedQ  DD: 11/01/2006  DT: 11/01/2006  Job #:  [id number] 

## 2011-01-09 NOTE — Assessment & Plan Note (Signed)
[facility]                                 ON-CALL NOTE   [name], [name]                        MRN:          [id number]  DATE:10/30/2006                            DOB:          [birth year]    PRIMARY CARE PHYSICIAN:  Casimiro Needle B. [name], M.D.   TELEPHONE PROGRESS NOTE:  Mrs. Don called complaining of fever and  still not feeling well after October 29, 2006 office visit. She noted  cough, chest congestion, and low grade fever. She had been seen by Tammy  on October 28, 2006 and given Zyrtec, Nasonex, and nasal saline spray. As  noted, she felt she was no better and wanted an antibiotic.   DISPOSITION:  I discussed this with the patient. Avelox 400 mg #7 1 p.o.  daily was called to [facility] pharmacy at [phone]. She was also  instructed to add Mucinex 2 p.o. b.i.d. and plenty of fluids to her  regimen. She finally asked for something for cough and Tussionex 4  ounces 1 teaspoon q.12 hours as needed for cough was called in as well.  She knows to followup with Dr. [name] next week.     Lonzo Cloud. Kriste Basque, MD  Electronically Signed    SMN/MedQ  DD: 10/30/2006  DT: 10/30/2006  Job #: [id number]

## 2011-01-09 NOTE — Assessment & Plan Note (Signed)
[facility]                             PULMONARY OFFICE NOTE   NAME:[name], [name]                      MRN:          [id number]  DATE:09/27/2006                            DOB:          1946-04-10    HISTORY OF PRESENT ILLNESS:  The patient is a 65 year old, white female  patient of Dr. Thurston Hole who has a known history of documented  gastroesophageal reflux with Barrett's and severe hoarseness who  presents in the office with a 2-day history of left ear pain and  pressure, along with nasal congestion.  The patient denies any purulent  sputum, fever, chest pain, shortness of breath, recent travel,  antibiotic use.   PAST MEDICAL HISTORY:  Reviewed.   CURRENT MEDICATIONS:  Reviewed.   PHYSICAL EXAM:  The patient is a pleasant female in no acute distress.  She is afebrile with stable vital signs.  O2 saturation is 96% on room  air.  HEENT:  Nasal mucosa is pale.  Eyes, conjunctivae not injected.  Posterior pharynx is clear.  TMs are normal bilaterally.  EACs are  clear.  NECK: Supple without adenopathy.  LUNGS:  Fields are clear.  CARDIAC:  Regular rate.  ABDOMEN:  Soft, nontender.  EXTREMITIES:  Warm without any edema.   IMPRESSION/PLAN:  Acute rhinitis and probable left eustachian tube  dysfunction.  The patient is to begin Nasonex 2 puffs twice daily.  Add  Zyrtec 10 mg nightly p.r.n.  The patient is to return back with Dr. [name]  as scheduled or sooner if needed.      Rubye Oaks, NP  Electronically Signed      Charlaine Dalton. [name], MD, [facility]  Electronically Signed   TP/MedQ  DD: 09/28/2006  DT: 09/28/2006  Job #: [id number]

## 2011-01-09 NOTE — Op Note (Signed)
NAME:  [name], [name]                         ACCOUNT NO.:  [id number]   MEDICAL RECORD NO.:  [id number]                   PATIENT TYPE:  AMB   LOCATION:  DSC                                  FACILITY:  [facility]   PHYSICIAN:  Currie Paris, M.D.           DATE OF BIRTH:  11-May-1946   DATE OF PROCEDURE:  02/14/2004  DATE OF DISCHARGE:                                 OPERATIVE REPORT   CCS [id number]   PREOPERATIVE DIAGNOSIS:  Chronically incarcerated umbilical hernia.   POSTOPERATIVE DIAGNOSIS:  Chronically incarcerated umbilical hernia.   OPERATION PERFORMED:  Repair of chronically incarcerated umbilical hernia  with mesh.   SURGEON:  Currie Paris, M.D.   ANESTHESIA:  General endotracheal.   INDICATIONS FOR PROCEDURE:  The patient is a 65 year old with an umbilical  hernia which I thought contained omentum, gradually enlarging.  She desired  to have this repaired.   DESCRIPTION OF PROCEDURE:  The patient was seen in the holding areas and had  no further questions.  She was taken to the operating room and after  satisfactory general endotracheal anesthesia was obtained, the abdomen was  prepped with alcohol and draped with Ioban drapes.  I injected some Marcaine  in the skin to help with postoperative pain relief and then made a  curvilinear incision.  She had a very large umbilical defect. Umbilical  amount of fat coming out and stretching of the umbilical skin.  I entered  the sac almost immediately but was unsuccessful in reducing omentum  initially and had to enlarge the fascial defect a little bit on either  direction and enlarge incision to get working room.  However, the omentum  did reduce and left me with a defect of about 2.5 to 3 cm in length.  I  freed up the subcutaneous tissue from the fascia to expose the first several  centimeters circumferentially.  The defect was closed primarily with #1  Novofil using a total of five sutures.  The two ends in the  middle were tied  and left uncut.   A piece of Marlex mesh was then cut in a circular fashion and threaded  through the three sutures and those were tied down attaching it to the  fascial closure.  The mesh was then tacked down to the fascia  circumferentially in all directions giving a couple of inches of coverage  around the defect in all directions.  Everything appeared to be dry.  Additional Marcaine was infiltrated into the fascia and incision closed in  layers with 3-0 Vicryl plus staples on the  skin.  I did have to excise a little bit of excess umbilical skin in order  to get good closure of the umbilicus because with the hernia reduced there  was a lot of redundant skin.   The patient tolerated the procedure well.  There were no operative  complications.  All counts were correct.  Currie Paris, M.D.    CJS/MEDQ  D:  02/14/2004  T:  02/15/2004  Job:  [id number]   cc:   Charlaine Dalton. [name], M.D. [facility]

## 2011-01-09 NOTE — Assessment & Plan Note (Signed)
[name]                               PULMONARY OFFICE NOTE   NAME:JOHNSONLyndel, [name]                      MRN:          [id number]  DATE:05/19/2006                            DOB:          1946/07/18    HISTORY:  A 65 year old white female with morbid obesity complicated by GERD  and obstructive sleep apnea but unable to tolerate CPAP. She states she is  however sleeping much better and denies any morning headache or excessive  daytime hypersomnolence. She does complain of mild chronic fatigue and has  not been able to make any headway at all in terms of losing weight toward  her agreed target of 158 (ideal less than 142). She denies any exertional  chest pain, orthopnea, PND or leg swelling. Admits she has not been good  about paying attention to calorie balance but says that she knows  everything she is suppose to about how to do this.   PAST MEDICAL HISTORY:  1. GERD complicated by Barrett's esophagitis documented May 25, 2005.  2. Cyclical cough related to rhinitis and GERD adequate control at      present.  3. Positive PVD in 1977 never treated.  4. Status post cholecystectomy in 1985.  5. Status post remote tubal ligation.   ALLERGIES:  EPINEPHRINE causes nonspecific reaction.   MEDICATIONS:  1. Caltrate 1000 mg 1 daily.  2. Actonel 1000 mg weekly.  3. Protonix 40 mg daily.  4. Cymbalta 30 mg daily.   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 She has never  smoked, she has no unusual exposure history.   FAMILY HISTORY:  Positive for emphysema and lung cancer in paternal  grandfather who was a smoker. Positive for allergies in her mother. Her  father developed heart disease in his 74s. Mother developed Alzheimer's  disease in her late 69s. Siblings are okay except for hypertension.   REVIEW OF SYSTEMS:  Taken in detail on the worksheet. Significant for nasal  congestion symptoms which varies in the season but is  present almost daily.  No itching, sneezing, sinus pain or wheezing.   PHYSICAL EXAMINATION:  GENERAL:  This is a morbidly obese white female in no  acute distress.  VITAL SIGNS:  She had stable vital signs. Weight is 221 pounds, height is 5  foot 2 which is unchanged from previous visits. Blood pressure is 126/72.  HEENT:  Auditory exam was somewhat limited. Funduscopy revealing no  significant retinal or arterial change. Ear canals clear bilaterally.  NECK:  Supple without cervical adenopathy or tenderness. Trachea is midline.  No thyromegaly.  LUNGS:  Lung fields were clear bilaterally to auscultation and percussion.  BREASTS/PELVIC:  Per Dr. Seymour Bars.  CARDIAC:  Regular rate and rhythm without murmur, gallop or rub present. No  displacement of PMI.  ABDOMEN:  Obese but otherwise benign. No palpable organomegaly, masses or  tenderness.  EXTREMITIES:  Warm without calf tenderness, cyanosis, clubbing or edema.  Pedal pulses are present bilaterally.  SKIN:  Warm and dry with no lesions.  MUSCULOSKELETAL:  Unremarkable.  NEUROLOGIC:  No focal deficits or pathologic reflexes.   LABORATORY DATA:  EKG was normal. LDL cholesterol was 159 otherwise entire  lab profile was unremarkable including TSH.   IMPRESSION:  1. Morbid obesity has not yet been addressed by the patient long-term. I      spent extra time talking to her about calorie balance issues giving her      a reasonable target of 158 with an ideal weight less than 142 based on      a height of 5 foot 2. I have offered the services of nutrition again      but she says she knows what to do. Just needs to do it.  2. Chronic rhinitis with nasal obstructive symptoms. She has already been      instructed twice on optimal use of nasal steroids. She has failed to do      so. I have reviewed these instructions in writing with her a third time      emphasizing that Nasacort needs to be tapered down to 1 q.h.s. and not      stop once she is  better and that she can use p.r.n. Claritin over-the-      counter for itching, sneezing or drippy nose but that Afrin before      Nasonex for 5 days was the best way to treat a stuffy nose.  3. Gastroesophageal reflux disease complicated by a small focus of      Barrett's by most recent upper endoscopy October 2006. Maintained on      proton pump inhibitor therapy and followed by Dr. Russella Dar in the      computer.  4. This patient is morbidly obese on Actonel with documented      gastroesophageal reflux disease with Barrett's. Actonel may not be the      best choice for her. If we are going to use a biphosphonate it probably      ought to be Boniva but I am going to bring this to Dr. Sharol Roussel      attention since she has been doing the surveillance on the patient.  5. Tendency to anxiety and depression followed now by Dr. Evelene Croon and      apparently doing better since switched over to Cymbalta.  6. Obstructive sleep apnea asymptomatic at present with no significant      hypersomnia or morning headache. Weight loss of course would resolve      this problem.  7. Health maintenance. GYN health maintenance per Dr. Seymour Bars. A Pneumovax      was given today because she has chronic rhinitis. She tells me a      tetanus shot was given within the last 5 years but I have 1995 written      on the records and have asked her to supply any documentation if she      can for the tetanus in the meantime if it was given otherwise she needs      a tetanus shot her next visit.   FOLLOWUP:  Every 3 months given the number of chronic problems this patient  has and her failure to progress toward goal therapy and I have emphasized  this point to her repeatedly today  in as nice but firm a way as possible to prevent further complications of  morbid obesity as she ages.            ______________________________  [name] Dalton [name], MD, [facility]      MBW/MedQ  DD:  05/20/2006  DT:  05/21/2006  Job #:  [id number]    cc:   [name], M.D.  [name], M.D.

## 2011-01-09 NOTE — Assessment & Plan Note (Signed)
[facility]                               PULMONARY OFFICE NOTE   NAME:[name], [name]                      MRN:          [id number]  DATE:04/21/2006                            DOB:          [birth year]    HISTORY:  This is a 65 year old white female who missed her last appointment  for comprehensive healthcare evaluation on July 23, 2005 and comes in  complaining of new-onset left-sided abdominal pain for the last 4 days which  is a sharp stabbing pain just to the left of her umbilicus without any  radiation or movement.  It was actually worse yesterday and seemed better  after ibuprofen.  She denies any changes in bowel or bladder habits,  associated nausea, anorexia or fever.   For full inventory of medications, please see face sheet dated April 21, 2006.   PHYSICAL EXAMINATION:  GENERAL:  She is a morbidly obese white female in no  acute distress, weighing now 225 pounds, which is no change in baseline.  VITAL SIGNS:  Unremarkable.  HEENT:  Unremarkable.  Oropharynx is clear.  LUNGS:  Lung fields are completely clear bilaterally.  CARDIAC:  Heart is normal to percussion.  There is a regular rate and rhythm  without murmur, gallop or rub.  ABDOMEN:  Completely soft and benign with no palpable tenderness or  organomegaly.  There is no evidence of ecchymosis or hernia.  Femoral pulses  are present bilaterally with no evidence of inguinal hernia, negative  iliopsoas sign.  EXTREMITIES:  Warm without calf tenderness, cyanosis, clubbing or edema.   IMPRESSION:  1. Abdominal pain, probably due to musculoskeletal stress.  Recommendation      is ibuprofen as needed.  I have recommended a comprehensive healthcare      evaluation in 6 weeks.  In the meantime, I did give her symptoms to      look for for more serious abdominal pain such as that associated with      either fever or visceral gastrointestinal symptoms.  2. Morbid obesity with  failure to progress toward goal.                                   Casimiro Needle B. [name], MD, [facility]   MBW/MedQ  DD:  04/21/2006  DT:  04/22/2006  Job #:  [id number]

## 2011-01-27 ENCOUNTER — Ambulatory Visit: Payer: Self-pay | Admitting: Gastroenterology

## 2011-03-26 ENCOUNTER — Encounter: Payer: Self-pay | Admitting: Gastroenterology

## 2011-03-26 ENCOUNTER — Ambulatory Visit (INDEPENDENT_AMBULATORY_CARE_PROVIDER_SITE_OTHER): Payer: PRIVATE HEALTH INSURANCE | Admitting: Gastroenterology

## 2011-03-26 VITALS — BP 134/68 | HR 68 | Ht 62.0 in | Wt 233.0 lb

## 2011-03-26 DIAGNOSIS — Z1211 Encounter for screening for malignant neoplasm of colon: Secondary | ICD-10-CM

## 2011-03-26 DIAGNOSIS — K219 Gastro-esophageal reflux disease without esophagitis: Secondary | ICD-10-CM

## 2011-03-26 MED ORDER — PANTOPRAZOLE SODIUM 40 MG PO TBEC: 40.0000 mg | DELAYED_RELEASE_TABLET | Freq: Two times a day (BID) | ORAL | Status: DC

## 2011-03-26 NOTE — Patient Instructions (Signed)
We have sent a refill for your pantoprazole to your pharmacy. Next colonoscopy is due 11/2013.

## 2011-03-26 NOTE — Progress Notes (Signed)
History of Present Illness: This is a 65 year old female with chronic GERD. Her symptoms are well controlled on pantoprazole 40 mg daily. Occasionally she takes famotidine 20 mg in the evening for breakthrough symptoms. Barrett's esophagus was noted on biopsies at endoscopy several years ago but she has subsequently had several endoscopies have not shown Barrett's so we have discontinued surveillance endoscopies. She denies dysphagia, odynophagia, weight loss, abdominal pain, chest pain, change in bowel habits, melena, hematochezia.  Current Medications, Allergies, Past Medical History, Past Surgical History, Family History and Social History were reviewed in [employer] record.  Physical Exam: General: Well developed , well nourished, no acute distress Head: Normocephalic and atraumatic Eyes:  sclerae anicteric, EOMI Ears: Normal auditory acuity Mouth: No deformity or lesions Lungs: Clear throughout to auscultation Heart: Regular rate and rhythm; no murmurs, rubs or bruits Abdomen: Soft, non tender and non distended. No masses, hepatosplenomegaly or hernias noted. Normal Bowel sounds Musculoskeletal: Symmetrical with no gross deformities  Pulses:  Normal pulses noted Extremities: No clubbing, cyanosis, edema or deformities noted Neurological: Alert oriented x 4, grossly nonfocal Psychological:  Alert and cooperative. Normal mood and affect  Assessment and Recommendations:  1. GERD. Continue standard antireflux measures and pantoprazole 40 mg twice daily. May use famotidine 20 mg in the evening as needed for breakthrough symptoms.  2. Obesity. Long-term weight loss program supervised by her primary physician is recommended.  3. Colorectal cancer screening. Screening colonoscopy due April 2015.

## 2011-03-27 ENCOUNTER — Encounter: Payer: Self-pay | Admitting: Gastroenterology

## 2011-03-30 ENCOUNTER — Encounter: Payer: Self-pay | Admitting: Gastroenterology

## 2011-04-02 ENCOUNTER — Telehealth: Payer: Self-pay | Admitting: Internal Medicine

## 2011-04-02 MED ORDER — MELOXICAM 15 MG PO TABS: 15.0000 mg | ORAL_TABLET | Freq: Every day | ORAL | Status: DC

## 2011-04-02 NOTE — Telephone Encounter (Signed)
Refill sent. Jennifer Castillo, CMA  

## 2011-04-06 ENCOUNTER — Ambulatory Visit (INDEPENDENT_AMBULATORY_CARE_PROVIDER_SITE_OTHER): Payer: PRIVATE HEALTH INSURANCE | Admitting: Adult Health

## 2011-04-06 ENCOUNTER — Encounter: Payer: Self-pay | Admitting: Adult Health

## 2011-04-06 ENCOUNTER — Telehealth: Payer: Self-pay | Admitting: Internal Medicine

## 2011-04-06 DIAGNOSIS — T148 Other injury of unspecified body region: Secondary | ICD-10-CM

## 2011-04-06 DIAGNOSIS — T148XXA Other injury of unspecified body region, initial encounter: Secondary | ICD-10-CM

## 2011-04-06 DIAGNOSIS — W57XXXA Bitten or stung by nonvenomous insect and other nonvenomous arthropods, initial encounter: Secondary | ICD-10-CM

## 2011-04-06 MED ORDER — DOXYCYCLINE HYCLATE 100 MG PO TABS: 100.0000 mg | ORAL_TABLET | Freq: Two times a day (BID) | ORAL | Status: AC

## 2011-04-06 NOTE — Telephone Encounter (Signed)
Pt wanted to come in and be evaluated b/c she was bitten by a tick on 04/02/11 and is now red and raised. Pt has an apt with TP at 4:15. Nothing further was needed

## 2011-04-06 NOTE — Patient Instructions (Signed)
Doxycycline 100mg  Twice daily  For 7 days  Use Tick repellent spray if hiking/outside.  Call back if develop rash or fever.  Please contact office for sooner follow up if symptoms do not improve or worsen or seek emergency care

## 2011-04-07 ENCOUNTER — Encounter: Payer: Self-pay | Admitting: Adult Health

## 2011-04-07 DIAGNOSIS — S1096XA Insect bite of unspecified part of neck, initial encounter: Secondary | ICD-10-CM | POA: Insufficient documentation

## 2011-04-07 DIAGNOSIS — W57XXXA Bitten or stung by nonvenomous insect and other nonvenomous arthropods, initial encounter: Secondary | ICD-10-CM | POA: Insufficient documentation

## 2011-04-07 NOTE — Progress Notes (Signed)
Subjective:    Patient ID: [name], female    DOB: 10-06-1945, 65 y.o.   MRN: [id number]  HPI 45 yowf never smoker with morbid obesity complicated by gastroesophageal reflux disease, and also a history of obstructive sleep apnea.   April 05, 2008 ov: new onset sensation something stuck in her throat with no dysphagia or fever and more of a tickle than a pain, resolved on short term pepcid at bedtime.   September 10, 2009--Returns for follow up and med review. Last visit flare in cough, Tx w/ steroids and aggressive GERD/Rhinitis prevention, and cough control. She is much improved w/ total resolution of cough.   June 03, 2010 cpx c/o more nasal congestion since september slt tickle in throat. >>no changes  04/06/2011 Acute OV  Complains that she had a tick ite on back of neck, found tick on 8.9.12 > red, raised, some itching and HA that pt unsure is related.  denies f/c/s. Has pets , believes this is where tick came from. She denies outdoor activities or known exposure. She says tick was very small. Attached and full , had to be removed. NO neck stiffness, joint swelling , rash or fever.    PMH:  HIATAL HERNIA (ICD-553.3) with GERD  - H/o Barretts esophagus  - EGD pos HH/ stricture 04/18/10 , neg barrett's  RHINITIS, CHRONIC (ICD-472.0)  - Remote allergy testing = molds/dust only  - Sinus CT 09/06/08 mild thickending only  OBSTRUCTIVE SLEEP APNEA (ICD-327.23)..................................................[name]  --PSG 03/31/02 AHI 64  --Nocturnal BIPAP  MORBID OBESITY (ICD-278.01)  - Target wt = 158 for BMI < 30  POSITIVE PPD (ICD-795.5)  COUGH (ICD-786.2)  DIVERTICULOSIS  HEALTH MAINTENANCE........................................................Marland KitchenWert  - DT May 23, 2009  - Pneumovax 9/07  - CPX June 03, 2010  - GYN Levoix  Complex med regimen  -------Meds reviewed with pt education and computerized med calendar completed September 10, 2009   Family History:    Father - CAD, ? 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  Never smoker, but Husband smokes  Illicit Drug Use - no  Patient does not get regular exercise.  No etoh    Review of Systems Constitutional:   No  weight loss, night sweats,  Fevers, chills, fatigue, or  lassitude.  HEENT:   No headaches,  Difficulty swallowing,  Tooth/dental problems, or  Sore throat,                No sneezing, itching, ear ache, nasal congestion, post nasal drip,   CV:  No chest pain,  Orthopnea, PND, swelling in lower extremities, anasarca, dizziness, palpitations, syncope.   GI  No heartburn, indigestion, abdominal pain, nausea, vomiting, diarrhea, change in bowel habits, loss of appetite, bloody stools.   Resp: No shortness of breath with exertion or at rest.  No excess mucus, no productive cough,  No non-productive cough,  No coughing up of blood.  No change in color of mucus.  No wheezing.  No chest wall deformity  Skin: no rash or lesions.  GU: no dysuria, change in color of urine, no urgency or frequency.  No flank pain, no hematuria   MS:  No joint pain or swelling.  No decreased range of motion.  No back pain.  Psych:  No change in mood or affect. No depression or anxiety.  No memory loss.         Objective:   Physical Exam GEN: A/Ox3; pleasant , NAD, well nourished   HEENT:  [place]/AT,  EACs-clear, TMs-wnl, NOSE-clear, THROAT-clear, no lesions, no postnasal drip or exudate noted.   NECK:  Supple w/ fair ROM; no JVD; normal carotid impulses w/o bruits; no thyromegaly or nodules palpated; no lymphadenopathy.  RESP  Clear  P & A; w/o, wheezes/ rales/ or rhonchi.no accessory muscle use, no dullness  to percussion  CARD:  RRR, no m/r/g  , no peripheral edema, pulses intact, no cyanosis or clubbing.  GI:   Soft & nt; nml bowel sounds; no organomegaly or masses detected.  Musco: Warm bil, no deformities or joint swelling noted.   Neuro: alert, no focal deficits noted.    Skin: Warm, along back of neck no rash or redness noted.           Assessment & Plan:

## 2011-04-07 NOTE — Assessment & Plan Note (Signed)
Empiric treatment for tick exposure/bite  Plan:  Doxycycline 100mg  Twice daily  For 7 days  Use Tick repellent spray if hiking/outside.  Call back if develop rash or fever.  Please contact office for sooner follow up if symptoms do not improve or worsen or seek emergency care

## 2011-04-16 ENCOUNTER — Telehealth: Payer: Self-pay | Admitting: Internal Medicine

## 2011-04-16 NOTE — Telephone Encounter (Signed)
Called, spoke with pt.  C/o HA, head congestion, nonprod cough that comes and goes, achy joints, low grade fever 99-99.5.  Started yesterday.  Denies increased SOB, wheezing, chest tightness.  Taking tylenol cold and sinus - relief with fever but not other symptoms.  OV offered for am with MW but pt requesting pm appt.  Scheduled for tomorrow evening at 2:45pm with TP and advised UC/ED if symptoms worsen prior to appt.  She verbalized understanding of these instructions.

## 2011-04-17 ENCOUNTER — Ambulatory Visit: Payer: PRIVATE HEALTH INSURANCE | Admitting: Adult Health

## 2011-04-24 ENCOUNTER — Telehealth: Payer: Self-pay | Admitting: Internal Medicine

## 2011-04-24 ENCOUNTER — Ambulatory Visit (INDEPENDENT_AMBULATORY_CARE_PROVIDER_SITE_OTHER): Payer: PRIVATE HEALTH INSURANCE | Admitting: Emergency Medicine

## 2011-04-24 ENCOUNTER — Encounter: Payer: Self-pay | Admitting: Emergency Medicine

## 2011-04-24 DIAGNOSIS — R05 Cough: Secondary | ICD-10-CM

## 2011-04-24 NOTE — Progress Notes (Signed)
Subjective:    Patient ID: [name], female    DOB: [birth year], 65 y.o.   MRN: [id number]  HPI 65 yowf never smoker with morbid obesity complicated by gastroesophageal reflux disease, and also a history of obstructive sleep apnea.   04/06/2011 Acute OV  Complains that she had a tick ite on back of neck, found tick on 8.9.12 > red, raised, some itching and HA that pt unsure is related.  denies f/c/s. Has pets , believes this is where tick came from. She denies outdoor activities or known exposure. She says tick was very small. Attached and full , had to be removed. NO neck stiffness, joint swelling , rash or fever.   Acute visit 04/24/11 -- hx UA irritation syndrome aggravated by GERD, allergies, OSA.  She reports that she had been doing well, no real flares of her allergies this Spring/Summer.  She was well until 10 days ago, she had severe head congestion + ear congestion + some cough and sore throat, some voice change. Was rx a week ago with steroid shot + cefachlor, congestion better but persistent dry cough. Has been on fluticasone nasal spray, took afrin x 7 days then stopped. Using tylenol cold/sinus.      PMH:  HIATAL HERNIA (ICD-553.3) with GERD  - H/o Barretts esophagus  - EGD pos HH/ stricture 04/18/10 , neg barrett's  RHINITIS, CHRONIC (ICD-472.0)  - Remote allergy testing = molds/dust only  - Sinus CT 09/06/08 mild thickending only  OBSTRUCTIVE SLEEP APNEA (ICD-327.23)..................................................[name]  --PSG 03/31/02 AHI 64  --Nocturnal BIPAP  MORBID OBESITY (ICD-278.01)  - Target wt = 158 for BMI < 30  POSITIVE PPD (ICD-795.5)  COUGH (ICD-786.2)  DIVERTICULOSIS  HEALTH MAINTENANCE........................................................Marland KitchenWert  - DT May 23, 2009  - Pneumovax 9/07  - CPX June 03, 2010  - GYN Levoix  Complex med regimen  -------Meds reviewed with pt education and computerized med calendar completed September 10, 2009    Family History:  Father - CAD, ? 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  Never smoker, but Husband smokes  Illicit Drug Use - no  Patient does not get regular exercise.  No etoh         Objective:   Physical Exam GEN: A/Ox3; pleasant , NAD, well nourished   HEENT:  [place]/AT,  EACs-clear, TMs-wnl, NOSE-clear, THROAT-clear, no lesions, no postnasal drip or exudate noted.   NECK:  Supple w/ fair ROM; no JVD; normal carotid impulses w/o bruits; no thyromegaly or nodules palpated; no lymphadenopathy.  RESP  Clear  P & A; w/o, wheezes/ rales/ or rhonchi.no accessory muscle use, no dullness to percussion  CARD:  RRR, no m/r/g  , no peripheral edema, pulses intact, no cyanosis or clubbing.  GI:   Soft & nt; nml bowel sounds; no organomegaly or masses detected.  Musco: Warm bil, no deformities or joint swelling noted.   Neuro: alert, no focal deficits noted.    Skin: Warm, along back of neck no rash or redness noted.      Assessment & Plan:  COUGH Exacerbated for last 10 days by either a viral URI or flaring allergies. Her GERD is controlled.  -  discussed strategies to speed improvement in her UA irritation - will try to give extra rx for allergies in case these are driving this exacerbation, start loratadine and NSW - contoinue rx GERD - she knows to call us if she develops sx of bronchitis.

## 2011-04-24 NOTE — Patient Instructions (Signed)
Finish your antibiotics as planned Start taking loratadine 10mg  daily for the next 2 weeks Start using nasal saline washes daily Continue your fluticasone nasal spray, decongestants as you are taking them Continue your protonix  Try to rest your voice and avoid throat clearing Call our office if you develop colored mucous, fevers/chills, or if you worsen in any way.  Follow up with Dr [name] as planned

## 2011-04-24 NOTE — Assessment & Plan Note (Signed)
Exacerbated for last 10 days by either a viral URI or flaring allergies. Her GERD is controlled.  - discussed strategies to speed improvement in her UA irritation - will try to give extra rx for allergies in case these are driving this exacerbation, start loratadine and NSW - contoinue rx GERD - she knows to call us if she develops sx of bronchitis.

## 2011-04-24 NOTE — Telephone Encounter (Signed)
Spoke with pt and she c/o hoarseness, cough, head congestion, some wheezing. Per MW have pt to come in and see rb this afternoon. Pt is aware to be here at 1:30 for OV with RB

## 2011-04-29 ENCOUNTER — Telehealth: Payer: Self-pay | Admitting: Internal Medicine

## 2011-04-29 NOTE — Telephone Encounter (Signed)
Ov Tammy asap with all meds in hand

## 2011-04-29 NOTE — Telephone Encounter (Signed)
I spoke with pt and she c/o extreme fatigue. Pt states she has been sick x 3 weeks. Pt states she is feeling some better now. Pt saw RB 04/24/11 as a sick visit. Pt states if she sits for a couple minutes then she will fall asleep b/c of how tired she is. Pt is requesting recs on what to do. Pt next OV with MW is 05/25/11 at 8:45. Please advise Dr. [name]. Thanks  Allergies  Allergen Reactions  . Epinephrine     REACTION: tachycardia    Carver Fila, CMA

## 2011-04-29 NOTE — Telephone Encounter (Signed)
lmomtcb on pt cell# since she has left work

## 2011-04-30 NOTE — Telephone Encounter (Signed)
Pt informed that MW wants her to see TP and bring all meds with her to the appt. Pt was reluctant on making this appt and says she has been seen here and at urgent care for her exhaustion and has missed several days or time from work. She did schedule an appt to see TP on Mon., 9/10 @ 4:30pm. Pt was very frustrated and feels that all recs are not helping her. Pt instructed to seek emergency help if needed before seen here on 9/10.

## 2011-05-04 ENCOUNTER — Ambulatory Visit: Payer: PRIVATE HEALTH INSURANCE | Admitting: Adult Health

## 2011-05-11 ENCOUNTER — Ambulatory Visit: Payer: PRIVATE HEALTH INSURANCE | Admitting: Adult Health

## 2011-05-25 ENCOUNTER — Encounter: Payer: Self-pay | Admitting: Internal Medicine

## 2011-05-25 ENCOUNTER — Ambulatory Visit (INDEPENDENT_AMBULATORY_CARE_PROVIDER_SITE_OTHER): Payer: PRIVATE HEALTH INSURANCE | Admitting: Internal Medicine

## 2011-05-25 ENCOUNTER — Other Ambulatory Visit (INDEPENDENT_AMBULATORY_CARE_PROVIDER_SITE_OTHER): Payer: PRIVATE HEALTH INSURANCE

## 2011-05-25 DIAGNOSIS — E7800 Pure hypercholesterolemia, unspecified: Secondary | ICD-10-CM

## 2011-05-25 DIAGNOSIS — M899 Disorder of bone, unspecified: Secondary | ICD-10-CM

## 2011-05-25 DIAGNOSIS — Z0000 Encounter for general adult medical examination without abnormal findings: Secondary | ICD-10-CM

## 2011-05-25 DIAGNOSIS — E559 Vitamin D deficiency, unspecified: Secondary | ICD-10-CM

## 2011-05-25 DIAGNOSIS — J310 Chronic rhinitis: Secondary | ICD-10-CM

## 2011-05-25 DIAGNOSIS — M8580 Other specified disorders of bone density and structure, unspecified site: Secondary | ICD-10-CM

## 2011-05-25 LAB — BASIC METABOLIC PANEL
BUN: 13 mg/dL (ref 6–23)
Calcium: 8.8 mg/dL (ref 8.4–10.5)
GFR: 99.5 mL/min (ref >60.00)
Glucose, Bld: 99 mg/dL (ref 70–99)

## 2011-05-25 LAB — CBC WITH DIFFERENTIAL/PLATELET
Basophils Relative: 0.5 % (ref 0.0–3.0)
Eosinophils Relative: 1.9 % (ref 0.0–5.0)
HCT: 41.2 % (ref 36.0–46.0)
Lymphs Abs: 2.1 10*3/uL (ref 0.7–4.0)
MCV: 90.9 fl (ref 78.0–100.0)
Monocytes Absolute: 0.4 10*3/uL (ref 0.1–1.0)
Neutro Abs: 4.7 10*3/uL (ref 1.4–7.7)
RBC: 4.53 Mil/uL (ref 3.87–5.11)
WBC: 7.4 10*3/uL (ref 4.5–10.5)

## 2011-05-25 LAB — HEPATIC FUNCTION PANEL: Total Bilirubin: 0.5 mg/dL (ref 0.3–1.2)

## 2011-05-25 LAB — LIPID PANEL
HDL: 52.1 mg/dL (ref >39.00)
Total CHOL/HDL Ratio: 4
VLDL: 19.6 mg/dL (ref 0.0–40.0)

## 2011-05-25 MED ORDER — MELOXICAM 15 MG PO TABS: 15.0000 mg | ORAL_TABLET | Freq: Every day | ORAL | Status: DC

## 2011-05-25 MED ORDER — TRAMADOL HCL 50 MG PO TABS: 50.0000 mg | ORAL_TABLET | Freq: Four times a day (QID) | ORAL | Status: DC | PRN

## 2011-05-25 NOTE — Patient Instructions (Addendum)
We will call you with your lab results  Weight control is simply a matter of calorie balance which needs to be tilted in your favor by eating less and exercising more.  To get the most out of exercise, you need to be continuously aware that you are short of breath, but never out of breath, for 30 minutes daily. As you improve, it will actually be easier for you to do the same amount of exercise  in  30 minutes so always push to the level where you are short of breath.  If this does not result in gradual weight reduction then I strongly recommend you see a nutritionist with a food diary x 2 weeks so that we can work out a negative calorie balance which is universally effective in steady weight loss programs.  Think of your calorie balance like you do your bank account where in this case you want the balance to go down so you must take in less calories than you burn up.  It's just that simple:  Hard to do, but easy to understand.  Good luck!  Watch your salt intake and monitor your blood pressure weekly  Please schedule a follow up visit in 3 months but call sooner if needed

## 2011-05-25 NOTE — Progress Notes (Signed)
Subjective:    Patient ID: MALYN AYTES, female    DOB: 10-15-1945, 65 y.o.   MRN: [id number]  HPI  65 yowf never smoker with morbid obesity complicated by gastroesophageal reflux disease, and also a history of obstructive sleep apnea.   April 05, 2008 ov: new onset sensation something stuck in her throat with no dysphagia or fever and more of a tickle than a pain, resolved on short term pepcid at bedtime.   September 10, 2009--Returns for follow up and med review. Last visit flare in cough, Tx w/ steroids and aggressive GERD/Rhinitis prevention, and cough control. She is much improved w/ total resolution of cough.   June 03, 2010 cpx c/o more nasal congestion since september slt tickle in throat.   05/25/2011 f/u ov/Devere Brem cc CPX persistent nasal congestion, drainage clear p rx abx per UC (cephalosporin), no better on otc antihistamines. No cough or sob, itching or variation  Sleeping ok without nocturnal  or early am exacerbation  of respiratory  c/o's or need for noct saba. Also denies any obvious fluctuation of symptoms with weather or environmental changes or other aggravating or alleviating factors except as outlined above      Past Medical History:  HIATAL HERNIA (ICD-553.3) with GERD  - H/o Barretts esophagus  - EGD pos HH/ stricture 04/18/10 , neg barrett's  RHINITIS, CHRONIC (ICD-472.0)  - Remote allergy testing = molds/dust only  - Sinus CT 09/06/08 mild thickending only  OBSTRUCTIVE SLEEP APNEA (ICD-327.23)..................................................[name]  --PSG 03/31/02 AHI 66  --Nocturnal BIPAP  MORBID OBESITY (ICD-278.01)  - Target wt = 158 for BMI < 30  POSITIVE PPD (ICD-795.5)  COUGH (ICD-786.2)  DIVERTICULOSIS  HEALTH MAINTENANCE........................................................Marland KitchenWert  - DT May 23, 2009  - Pneumovax 9/07  - CPX 05/25/2011  - GYN Levoix  Complex med regimen  -------Meds reviewed with pt education and computerized med calendar  completed September 10, 2009   Family History:  Father - CAD, ? 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  Never smoker, but Husband smokes  Illicit Drug Use - no  Patient does not get regular exercise.  No etoh         Review of Systems  Constitutional: Negative for fever, chills, diaphoresis, activity change, appetite change, fatigue and unexpected weight change.  HENT: Positive for sneezing and postnasal drip. Negative for hearing loss, ear pain, nosebleeds, congestion, sore throat, facial swelling, rhinorrhea, mouth sores, trouble swallowing, neck pain, neck stiffness, dental problem, voice change, sinus pressure, tinnitus and ear discharge.   Eyes: Positive for visual disturbance. Negative for photophobia, discharge and itching.  Respiratory: Negative for apnea, cough, choking, chest tightness, shortness of breath, wheezing and stridor.   Cardiovascular: Negative for chest pain, palpitations and leg swelling.  Gastrointestinal: Negative for nausea, vomiting, abdominal pain, constipation, blood in stool and abdominal distention.  Genitourinary: Negative for dysuria, urgency, frequency, hematuria, flank pain, decreased urine volume and difficulty urinating.  Musculoskeletal: Positive for joint swelling. Negative for myalgias, back pain, arthralgias and gait problem.  Skin: Negative for color change, pallor and rash.  Neurological: Negative for dizziness, tremors, seizures, syncope, speech difficulty, weakness, light-headedness, numbness and headaches.  Hematological: Negative  for adenopathy. Does not bruise/bleed easily.  Psychiatric/Behavioral: Negative for confusion, sleep disturbance and  agitation. The patient is not nervous/anxious.        Objective:   Physical Exam  wt 224 May 23, 2009 > 226 September 10, 2009 > 229 November 05, 2009 > 232 June 03, 2010 > 224 05/25/2011  Ambulatory obese  wf in no acute distress.  HEENT: nl dentition, and orophanx. Nl external ear canals without cough reflex, elongated uvula, thick tongue.  Mod bilateral turbinate edema mucoid secretions no polyps seenl  Neck without JVD/Nodes/TM  Lungs clear to A and P bilaterally without cough on insp or exp maneuvers  RRR no s3 or murmur or increase in P2. No edema  Abd soft and obese but benign with nl excursion in the supine position. No bruits or organomegaly  Ext warm without calf tenderness, cyanosis clubbing  Skin warm and dry without lesions  Neuro alert, nl sensorium, no motor, cerebellar def  MS Nl gait, no joint restrictions GYN/rectal per Levoix, gynecologist       Assessment & Plan:

## 2011-05-26 ENCOUNTER — Encounter: Payer: Self-pay | Admitting: Internal Medicine

## 2011-05-26 ENCOUNTER — Telehealth: Payer: Self-pay | Admitting: Internal Medicine

## 2011-05-26 NOTE — Assessment & Plan Note (Signed)
Reviewed rx including need to add h2hs if flares

## 2011-05-26 NOTE — Telephone Encounter (Signed)
Called patient and advised that [facility] requires an original signature on all orders, they will not accept a stamped signature. Dr [name] is in [place] today and will not be back in the office until Wed. 05/27/11. I will fax this to her once I get him to sign the order. I will contact her before I fax this. Pt is aware and verbalized understanding. Pt will take care of scheduling this since she works at [employer] and will see that Dr. [name] gets a copy faxed to him at [phone].

## 2011-05-26 NOTE — Assessment & Plan Note (Signed)
Due for dexa, ordered, plus check vit d level

## 2011-05-26 NOTE — Assessment & Plan Note (Signed)
Adequate control on present rx, reviewed  

## 2011-05-26 NOTE — Assessment & Plan Note (Signed)
Check vit d level, supplement if needed

## 2011-06-17 ENCOUNTER — Telehealth: Payer: Self-pay | Admitting: Internal Medicine

## 2011-06-17 ENCOUNTER — Encounter: Payer: Self-pay | Admitting: Internal Medicine

## 2011-06-17 NOTE — Telephone Encounter (Signed)
Very mild osteopenia not needing specific rx at this point, will discuss longterm (over the next 5 years) options on return ov

## 2011-06-17 NOTE — Telephone Encounter (Signed)
Called and spoke with pt.  Pt states she had a bone density done on Monday 10/22 at [facility] and is requesting these results.  Please advise.  Thanks.

## 2011-06-17 NOTE — Telephone Encounter (Signed)
Spoke with pt and notified of results per Dr. [name]. Pt verbalized understanding and denied any questions. 

## 2011-06-26 ENCOUNTER — Encounter: Payer: Self-pay | Admitting: Internal Medicine

## 2011-06-29 ENCOUNTER — Ambulatory Visit (INDEPENDENT_AMBULATORY_CARE_PROVIDER_SITE_OTHER): Payer: PRIVATE HEALTH INSURANCE | Admitting: Psychology

## 2011-06-29 DIAGNOSIS — F341 Dysthymic disorder: Secondary | ICD-10-CM

## 2011-06-30 ENCOUNTER — Telehealth: Payer: Self-pay | Admitting: Internal Medicine

## 2011-06-30 DIAGNOSIS — Z0000 Encounter for general adult medical examination without abnormal findings: Secondary | ICD-10-CM

## 2011-06-30 NOTE — Telephone Encounter (Signed)
Ok for mammograms

## 2011-06-30 NOTE — Telephone Encounter (Signed)
Dr wert is this ok to send order to [facility]. For pt to have mammagram under your name?

## 2011-06-30 NOTE — Telephone Encounter (Signed)
Pt aware and order placed.Jennifer Castillo, CMA  

## 2011-07-06 ENCOUNTER — Telehealth: Payer: Self-pay | Admitting: Internal Medicine

## 2011-07-06 NOTE — Telephone Encounter (Signed)
Order faxed by Almyra Free and I called and left message informing same.

## 2011-07-09 ENCOUNTER — Telehealth: Payer: Self-pay | Admitting: Internal Medicine

## 2011-07-09 DIAGNOSIS — Z1231 Encounter for screening mammogram for malignant neoplasm of breast: Secondary | ICD-10-CM

## 2011-07-09 NOTE — Telephone Encounter (Signed)
Spoke with Pam. She states that the order that we sent was for digital bilateral diagnostic screening- however, the pt only needs the screening mammo. I refaxed the correct order to [facility] at the number given.

## 2011-07-22 ENCOUNTER — Ambulatory Visit: Payer: PRIVATE HEALTH INSURANCE | Admitting: Psychology

## 2011-08-31 ENCOUNTER — Ambulatory Visit (INDEPENDENT_AMBULATORY_CARE_PROVIDER_SITE_OTHER): Payer: PRIVATE HEALTH INSURANCE | Admitting: Psychology

## 2011-08-31 DIAGNOSIS — F341 Dysthymic disorder: Secondary | ICD-10-CM

## 2011-09-08 ENCOUNTER — Encounter: Payer: Self-pay | Admitting: Internal Medicine

## 2011-10-28 ENCOUNTER — Other Ambulatory Visit: Payer: Self-pay

## 2011-10-28 ENCOUNTER — Emergency Department (HOSPITAL_COMMUNITY): Payer: PRIVATE HEALTH INSURANCE

## 2011-10-28 ENCOUNTER — Encounter (HOSPITAL_COMMUNITY): Payer: Self-pay

## 2011-10-28 ENCOUNTER — Emergency Department (HOSPITAL_COMMUNITY)
Admission: EM | Admit: 2011-10-28 | Discharge: 2011-10-28 | Disposition: A | Discharge status: Home / Self Care | Payer: PRIVATE HEALTH INSURANCE | Attending: Emergency Medicine | Admitting: Emergency Medicine

## 2011-10-28 ENCOUNTER — Telehealth: Payer: Self-pay | Admitting: Internal Medicine

## 2011-10-28 DIAGNOSIS — R079 Chest pain, unspecified: Secondary | ICD-10-CM | POA: Insufficient documentation

## 2011-10-28 DIAGNOSIS — E669 Obesity, unspecified: Secondary | ICD-10-CM | POA: Insufficient documentation

## 2011-10-28 LAB — POCT I-STAT TROPONIN I: Troponin i, poc: 0.02 ng/mL (ref 0.00–0.08)

## 2011-10-28 LAB — CBC
HCT: 38.4 % (ref 36.0–46.0)
MCV: 90.8 fL (ref 78.0–100.0)
Platelets: 300 10*3/uL (ref 150–400)
RBC: 4.23 MIL/uL (ref 3.87–5.11)
WBC: 5.7 10*3/uL (ref 4.0–10.5)

## 2011-10-28 LAB — POCT I-STAT, CHEM 8
Chloride: 103 mEq/L (ref 96–112)
Glucose, Bld: 104 mg/dL — ABNORMAL HIGH (ref 70–99)
HCT: 43 % (ref 36.0–46.0)
Potassium: 3.9 mEq/L (ref 3.5–5.1)

## 2011-10-28 LAB — DIFFERENTIAL
Eosinophils Relative: 2 % (ref 0–5)
Lymphocytes Relative: 17 % (ref 12–46)
Lymphs Abs: 1 10*3/uL (ref 0.7–4.0)
Monocytes Absolute: 0.6 10*3/uL (ref 0.1–1.0)

## 2011-10-28 NOTE — Discharge Instructions (Signed)
yOUR LABS, EKG AND CHEST X-RAY ARE NORMAL, SUPPORTING A DIAGNOSIS THAT IS NOT CARDIAC IN NATURE. YOU CAN BE DISCHARGED HOME AND SHOULD FOLLOW UP WITH YOUR DOCTOR FOR RECHECK AS NEEDED.  RETURN HERE WITH WORSENING PAIN OR NEW CONCERN. CONTINUE YOUR CURRENT MEDICATIONS.  Chest Pain (Nonspecific) It is often hard to give a specific diagnosis for the cause of chest pain. There is always a chance that your pain could be related to something serious, such as a heart attack or a blood clot in the lungs. You need to follow up with your caregiver for further evaluation. CAUSES   Heartburn.   Pneumonia or bronchitis.   Anxiety or stress.   Inflammation around your heart (pericarditis) or lung (pleuritis or pleurisy).   A blood clot in the lung.   A collapsed lung (pneumothorax). It can develop suddenly on its own (spontaneous pneumothorax) or from injury (trauma) to the chest.   Shingles infection (herpes zoster virus).  The chest wall is composed of bones, muscles, and cartilage. Any of these can be the source of the pain.  The bones can be bruised by injury.   The muscles or cartilage can be strained by coughing or overwork.   The cartilage can be affected by inflammation and become sore (costochondritis).  DIAGNOSIS  Lab tests or other studies, such as X-rays, electrocardiography, stress testing, or cardiac imaging, may be needed to find the cause of your pain.  TREATMENT   Treatment depends on what may be causing your chest pain. Treatment may include:   Acid blockers for heartburn.   Anti-inflammatory medicine.   Pain medicine for inflammatory conditions.   Antibiotics if an infection is present.   You may be advised to change lifestyle habits. This includes stopping smoking and avoiding alcohol, caffeine, and chocolate.   You may be advised to keep your head raised (elevated) when sleeping. This reduces the chance of acid going backward from your stomach into your esophagus.    Most of the time, nonspecific chest pain will improve within 2 to 3 days with rest and mild pain medicine.  HOME CARE INSTRUCTIONS   If antibiotics were prescribed, take your antibiotics as directed. Finish them even if you start to feel better.   For the next few days, avoid physical activities that bring on chest pain. Continue physical activities as directed.   Do not smoke.   Avoid drinking alcohol.   Only take over-the-counter or prescription medicine for pain, discomfort, or fever as directed by your caregiver.   Follow your caregiver's suggestions for further testing if your chest pain does not go away.   Keep any follow-up appointments you made. If you do not go to an appointment, you could develop lasting (chronic) problems with pain. If there is any problem keeping an appointment, you must call to reschedule.  SEEK MEDICAL CARE IF:   You think you are having problems from the medicine you are taking. Read your medicine instructions carefully.   Your chest pain does not go away, even after treatment.   You develop a rash with blisters on your chest.  SEEK IMMEDIATE MEDICAL CARE IF:   You have increased chest pain or pain that spreads to your arm, neck, jaw, back, or abdomen.   You develop shortness of breath, an increasing cough, or you are coughing up blood.   You have severe back or abdominal pain, feel nauseous, or vomit.   You develop severe weakness, fainting, or chills.   You have  a fever.  THIS IS AN EMERGENCY. Do not wait to see if the pain will go away. Get medical help at once. Call your local emergency services (911 in [place] Do not drive yourself to the hospital. MAKE SURE YOU:   Understand these instructions.   Will watch your condition.   Will get help right away if you are not doing well or get worse.  Document Released: 05/20/2005 Document Revised: 07/30/2011 Document Reviewed: 03/15/2008 [facility] Patient Information 2012 [place], [place].

## 2011-10-28 NOTE — ED Provider Notes (Signed)
History     CSN: [id number]  Arrival date & time 10/28/11  1240   First MD Initiated Contact with Patient 10/28/11 1301      No chief complaint on file.   (Consider location/radiation/quality/duration/timing/severity/associated sxs/prior treatment) Patient is a 66 y.o. female presenting with chest pain. The history is provided by the patient.  Chest Pain The chest pain began yesterday. Chest pain occurs constantly. The chest pain is unchanged. The pain is currently at 2/10. The quality of the pain is described as aching and dull. Chest pain is worsened by exertion. Pertinent negatives for primary symptoms include no fever, no shortness of breath, no cough and no nausea. Associated symptoms comments: She was involved in an argument with her son when it started last night and she took a Xanax that relieved her pain. She reports it is like anxiety type pain she has had in the past except that when she woke this morning it had returned. Since this morning it has been constant, around a 2 on a 10 scale. She also had her blood pressure checked at work and it was found to be elevated with no history of treated hypertension. Mildly worse with activity. No SOB, nausea. She does not have any history of cardiac problems or previous heart evaluation. .     Past Medical History  Diagnosis Date  . Hiatal hernia   . GERD (gastroesophageal reflux disease)   . Allergic rhinitis   . OSA (obstructive sleep apnea)   . Morbid obesity   . Positive PPD   . Diverticulosis   . Esophageal stricture   . Gastric polyps   . Barrett's esophagus 05/2005  . Anxiety and depression   . Arthritis     Past Surgical History  Procedure Date  . Cesarean section     x 2  . Cholecystectomy   . Foot fracture surgery     right   . Foot neuroma surgery     left   . Tubal ligation 1981  . Wrist fracture surgery     right   . Finger surgery     cyst removel from right index finger  . Tonsillectomy     Family  History  Problem Relation Age of Onset  . Coronary artery disease Father   . Heart failure Father   . Allergies Mother   . Alzheimer's disease Mother   . Heart failure Mother   . Hypertension Brother     x 3  . Sleep apnea Brother   . Sleep apnea Son   . Colon cancer Maternal Uncle   . Lung cancer Paternal Grandfather   . Diabetes Son   . Heart attack Paternal Grandmother     History  Substance Use Topics  . Smoking status: Never Smoker   . Smokeless tobacco: Never Used   Comment: pt has never smoked but husband smokes  . Alcohol Use: No    OB History    Grav Para Term Preterm Abortions TAB SAB Ect Mult Living                  Review of Systems  Constitutional: Negative for fever and chills.  HENT: Negative.   Respiratory: Negative.  Negative for cough and shortness of breath.   Cardiovascular: Positive for chest pain.  Gastrointestinal: Negative.  Negative for nausea.  Musculoskeletal: Negative.   Skin: Negative.   Neurological: Negative.     Allergies  Epinephrine  Home Medications   Current Outpatient Rx  Name Route Sig Dispense Refill  . ACETAMINOPHEN 325 MG PO TABS Oral Take 650 mg by mouth every 6 (six) hours as needed. For pain    . ALPRAZOLAM 0.25 MG PO TABS Oral Take 0.125 mg by mouth 3 (three) times daily as needed. For anxiety    . VITAMIN C 500 MG PO TABS Oral Take 500 mg by mouth daily.      . B COMPLEX PO TABS Oral Take 1 tablet by mouth daily.     [name] CALCIUM CARBONATE-VITAMIN D 600-400 MG-UNIT PO TABS Oral Take 1 tablet by mouth daily.      . DULOXETINE HCL 60 MG PO CPEP Oral Take 60 mg by mouth daily.      [name] FAMOTIDINE 20 MG PO TABS Oral Take 20 mg by mouth at bedtime as needed. For allergies    . MELOXICAM 15 MG PO TABS Oral Take 15 mg by mouth daily as needed. With food    . MOMETASONE FUROATE 50 MCG/ACT NA SUSP Nasal Place 2 sprays into the nose daily.    [name] PANTOPRAZOLE SODIUM 40 MG PO TBEC Oral Take 40 mg by mouth 2 (two) times daily. Take  one 30-60 minutes before first and last meals of the day    . PSYLLIUM 0.52 G PO CAPS  Take 2 capsules two times a day as needed       BP 137/60  Pulse 92  Temp(Src) 97.9 F (36.6 C) (Oral)  Resp 16  Ht 5\' 2"  (1.575 m)  Wt 228 lb (103.42 kg)  BMI 41.70 kg/m2  SpO2 94%  Physical Exam  Constitutional: She appears well-developed and well-nourished.  HENT:  Head: Normocephalic.  Neck: Normal range of motion. Neck supple.  Cardiovascular: Normal rate and regular rhythm.   Pulmonary/Chest: Effort normal and breath sounds normal.  Abdominal: Soft. Bowel sounds are normal. There is no tenderness. There is no rebound and no guarding.       Obese abdomen.  Musculoskeletal: Normal range of motion. She exhibits no edema.  Neurological: She is alert. No cranial nerve deficit.  Skin: Skin is warm and dry. No rash noted.  Psychiatric: She has a normal mood and affect.    ED Course  Procedures (including critical care time)   Labs Reviewed  CBC  DIFFERENTIAL   No results found.   No diagnosis found.    MDM  Chest pain is atypical for cardiac chest pain as it is constant and is not associated with other typical symptoms, specifically SOB, nausea, diaphoresis and she is without significant risk factors. Negative Troponin, EKG and CXR. Feel patient can be discharged home to follow up with primary care.         Rodena Medin, PA-C 10/28/11 1530

## 2011-10-28 NOTE — ED Notes (Signed)
Patient reports that she egan having CP around 2300 last night and thought it was anxiety so she took a Xanax. Slept. Continued to have CP this AM and decided to come to the hospital. Patient denies SOB, diaphoresis or radiation of [pain

## 2011-10-28 NOTE — Telephone Encounter (Signed)
I spoke with the pt and she states that she has been under a lot of stress over the last 24 hours and she was having some chest discomfort. So she asked a coworker to check her BP and it was 189/90. She denies any headache, blurry vision, or chest pain. I asked her to describe the chest discomfort, and she states it is more like a tightness in the center of her chest. The pain does not radiate from that spot. She denies any arm pain, back pain etc. She has taken a xanax and this has not helped. The pt is not currently on any BP meds. No appts available today so the pt was advised to go to urgent care or ER to be evaluated. Pt was upset by this and stated that every time she calls in she is told to go to urgent care.  I apologized to her and advised that we always do our best to get pt in to be seen as soon as possible, but we have no availability today and for her symptoms it is best for her to go to ER or urgent care so she can be properly treated. Carron Curie, CMA

## 2011-10-28 NOTE — ED Notes (Signed)
[id number]<AV> Expected date:<BR> Expected time:<BR> Means of arrival:<BR> Comments:<BR> Pt in triage 1- Laural Benes

## 2011-10-29 NOTE — ED Provider Notes (Signed)
Medical screening examination/treatment/procedure(s) were performed by non-physician practitioner and as supervising physician I was immediately available for consultation/collaboration.  Gerhard Munch, MD 10/29/11 [id number]

## 2012-01-11 ENCOUNTER — Other Ambulatory Visit (INDEPENDENT_AMBULATORY_CARE_PROVIDER_SITE_OTHER): Payer: PRIVATE HEALTH INSURANCE

## 2012-01-11 ENCOUNTER — Ambulatory Visit (INDEPENDENT_AMBULATORY_CARE_PROVIDER_SITE_OTHER): Payer: PRIVATE HEALTH INSURANCE | Admitting: Adult Health

## 2012-01-11 ENCOUNTER — Encounter: Payer: Self-pay | Admitting: Adult Health

## 2012-01-11 VITALS — BP 140/76 | HR 67 | Temp 96.9°F | Ht 62.0 in | Wt 230.6 lb

## 2012-01-11 DIAGNOSIS — I10 Essential (primary) hypertension: Secondary | ICD-10-CM

## 2012-01-11 LAB — BASIC METABOLIC PANEL
GFR: 95.83 mL/min (ref >60.00)
Glucose, Bld: 83 mg/dL (ref 70–99)
Potassium: 3.7 mEq/L (ref 3.5–5.1)
Sodium: 141 mEq/L (ref 135–145)

## 2012-01-11 MED ORDER — VALSARTAN-HYDROCHLOROTHIAZIDE 160-12.5 MG PO TABS: 1.0000 | ORAL_TABLET | Freq: Every day | ORAL | Status: DC

## 2012-01-11 NOTE — Patient Instructions (Addendum)
Begin Diovan HCT 160/12.5mg  daily  Low salt diet Exercise as tolerated.  Legs elevated.  Weight loss  Please contact office for sooner follow up if symptoms do not improve or worsen or seek emergency care  follow up Dr. [name]  In 6 weeks and As needed

## 2012-01-11 NOTE — Progress Notes (Signed)
Subjective:    Patient ID: [name], female    DOB: 03-May-1946, 66 y.o.   MRN: [id number]  HPI  57 yowf never smoker with morbid obesity complicated by gastroesophageal reflux disease, and also a history of obstructive sleep apnea.   April 05, 2008 ov: new onset sensation something stuck in her throat with no dysphagia or fever and more of a tickle than a pain, resolved on short term pepcid at bedtime.   September 10, 2009--Returns for follow up and med review. Last visit flare in cough, Tx w/ steroids and aggressive GERD/Rhinitis prevention, and cough control. She is much improved w/ total resolution of cough.   June 03, 2010 cpx c/o more nasal congestion since september slt tickle in throat.   05/25/2011 f/u ov/[name] cc CPX persistent nasal congestion, drainage clear p rx abx per UC (cephalosporin), no better on otc antihistamines. No cough or sob, itching or variation >>labs done , no changes   01/11/2012 Acute OV  Presents for work in visit. Has noted elevated blood pressure for last 2 weeks.  B/p has been trending up over last few months.  Also has edema in ankles , worse in evening . No calf pain or redness. No fever.  Feels fine . No headache or visual changes .  No new meds .  No decongestants .      Past Medical History:  HIATAL HERNIA (ICD-553.3) with GERD  - H/o Barretts esophagus  - EGD pos HH/ stricture 04/18/10 , neg barrett's  RHINITIS, CHRONIC (ICD-472.0)  - Remote allergy testing = molds/dust only  - Sinus CT 09/06/08 mild thickending only  OBSTRUCTIVE SLEEP APNEA (ICD-327.23)..................................................[name]  --PSG 03/31/02 AHI 64  --Nocturnal BIPAP  MORBID OBESITY (ICD-278.01)  - Target wt = 158 for BMI < 30  POSITIVE PPD (ICD-795.5)  COUGH (ICD-786.2)  DIVERTICULOSIS  HEALTH MAINTENANCE........................................................Marland KitchenWert  - DT May 23, 2009  - Pneumovax 9/07  - CPX 05/25/2011  - GYN Levoix    Complex med regimen  -------Meds reviewed with pt education and computerized med calendar completed September 10, 2009   Family History:  Father - CAD, ? 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  Never smoker, but Husband smokes  Illicit Drug Use - no  Patient does not get regular exercise.  No etoh         Review of Systems  Constitutional:   No  weight loss, night sweats,  Fevers, chills, fatigue, or  lassitude.  HEENT:   No headaches,  Difficulty swallowing,  Tooth/dental problems, or  Sore throat,                No sneezing, itching, ear ache, nasal congestion, post nasal drip,   CV:  No chest pain,  Orthopnea, PND,   anasarca, dizziness, palpitations, syncope.   GI  No heartburn, indigestion, abdominal pain, nausea, vomiting, diarrhea, change in bowel habits, loss of appetite, bloody stools.   Resp: No shortness of breath with exertion or at rest.  No excess mucus, no productive cough,  No non-productive cough,  No coughing up of blood.  No change in color of mucus.  No wheezing.  No  chest wall deformity  Skin: no rash or lesions.  GU: no dysuria, change in color of urine, no urgency or frequency.  No flank pain, no hematuria   MS:  No joint pain or swelling.  No decreased range of motion.  No back pain.  Psych:  No change in mood or affect. No depression or anxiety.  No memory loss.          Objective:   Physical Exam  wt 224 May 23, 2009 > 226 September 10, 2009 > 229 November 05, 2009 > 232 June 03, 2010 > 224 05/25/2011 >230 01/11/12  Ambulatory obese  wf in no acute distress.  HEENT: nl dentition, and orophanx. Nl external ear canals without cough reflex, elongated  uvula, thick tongue.  Mod bilateral turbinate edema mucoid secretions no polyps seenl  Neck without JVD/Nodes/TM  Lungs clear to A and P bilaterally without cough on insp or exp maneuvers  RRR no s3 or murmur or increase in P2. Tr  edema  Abd soft and obese but benign with nl excursion in the supine position. No bruits or organomegaly  Ext warm without calf tenderness, cyanosis clubbing  Skin warm and dry without lesions  Neuro alert, nl sensorium, no focal deficits noted. CN 2-12 intact.  MS Nl gait, no joint restrictions GYN/rectal per Levoix, gynecologist       Assessment & Plan:

## 2012-01-12 LAB — BRAIN NATRIURETIC PEPTIDE: Pro B Natriuretic peptide (BNP): 17 pg/mL (ref 0.0–100.0)

## 2012-01-14 ENCOUNTER — Telehealth: Payer: Self-pay | Admitting: Internal Medicine

## 2012-01-14 NOTE — Telephone Encounter (Signed)
TP, please advise lab results thanks!

## 2012-01-14 NOTE — Telephone Encounter (Signed)
Called spoke with patient, advised of nml lab results as stated by TP in 5.21.13 result note.  Pt verbalized her understanding.

## 2012-01-15 DIAGNOSIS — I10 Essential (primary) hypertension: Secondary | ICD-10-CM | POA: Insufficient documentation

## 2012-01-15 NOTE — Assessment & Plan Note (Signed)
Plan:  Begin Diovan 160/12.5mg  daily  Low salt diet Exercise as tolerated.  Legs elevated.  Weight loss  Please contact office for sooner follow up if symptoms do not improve or worsen or seek emergency care  follow up Dr. [name]  In 6 weeks and As needed

## 2012-01-25 ENCOUNTER — Telehealth: Payer: Self-pay | Admitting: Internal Medicine

## 2012-01-25 MED ORDER — AZITHROMYCIN 250 MG PO TABS: ORAL_TABLET | ORAL | Status: AC

## 2012-01-25 NOTE — Telephone Encounter (Signed)
Does not need to take both  Finish amoxicillin if still discolored mucus can fill zpack  Please contact office for sooner follow up if symptoms do not improve or worsen or seek emergency care

## 2012-01-25 NOTE — Telephone Encounter (Signed)
lmomtcb  

## 2012-01-25 NOTE — Telephone Encounter (Signed)
Spoke with pt and notified of recs per TP She verbalized understanding and denied any questions Rx was sent to pharm  

## 2012-01-25 NOTE — Telephone Encounter (Signed)
Spoke with pt. She is c/o chest and head congestion x 4 days. She states cough is non prod, but she has yellow/green nasal d/c. She denies any f/c/s. Taking tylenol cold and sinus without relief. MW out of the office and no openings today. Please advise, thanks! Allergies  Allergen Reactions  . Epinephrine     REACTION: tachycardia

## 2012-01-25 NOTE — Telephone Encounter (Signed)
Pt called back and stated that her dentist started her on amoxicillin tid  Last Thursday(5/30) and she forgot to let us know this earlier when she called in for abx.  zpak has been sent in for the pt.  TP please advise anything further needed for this pt.  Thanks Allergies  Allergen Reactions  . Epinephrine     REACTION: tachycardia

## 2012-01-25 NOTE — Telephone Encounter (Signed)
Zpack - take as dir. , #1 , no refils  mucinex , fluids and rest  Please contact office for sooner follow up if symptoms do not improve or worsen or seek emergency care

## 2012-01-25 NOTE — Telephone Encounter (Signed)
lmom with [name]'s recs. And also if pt had further questions to please call us back

## 2012-02-09 ENCOUNTER — Telehealth: Payer: Self-pay | Admitting: Internal Medicine

## 2012-02-09 MED ORDER — LOSARTAN POTASSIUM-HCTZ 50-12.5 MG PO TABS: 1.0000 | ORAL_TABLET | Freq: Every day | ORAL | Status: DC

## 2012-02-09 NOTE — Telephone Encounter (Signed)
Spoke with pt. She states ever since she started diovan hct 160-12.5 on 01/11/12 she has had extreme fatigue, feels worn out all of the time. She states that she was thinking that with time it would improve, but it has not. Her BP is doing well and edema gone, but wants to switch meds. Please advise, thanks!

## 2012-02-09 NOTE — Telephone Encounter (Signed)
Not at all likely it's the diovan but ok to change to hyzaar 50/12/5 one daily x one month then ov to regroup

## 2012-02-09 NOTE — Telephone Encounter (Signed)
Spoke with pt and notified of recs per MW. Rx for hyzaar was sent and pt to keep 03-04-12 appt and call sooner with any problems.

## 2012-02-29 ENCOUNTER — Ambulatory Visit: Payer: PRIVATE HEALTH INSURANCE | Admitting: Internal Medicine

## 2012-03-04 ENCOUNTER — Other Ambulatory Visit (INDEPENDENT_AMBULATORY_CARE_PROVIDER_SITE_OTHER): Payer: PRIVATE HEALTH INSURANCE

## 2012-03-04 ENCOUNTER — Encounter: Payer: Self-pay | Admitting: Internal Medicine

## 2012-03-04 ENCOUNTER — Ambulatory Visit (INDEPENDENT_AMBULATORY_CARE_PROVIDER_SITE_OTHER): Payer: PRIVATE HEALTH INSURANCE | Admitting: Internal Medicine

## 2012-03-04 ENCOUNTER — Telehealth: Payer: Self-pay | Admitting: Internal Medicine

## 2012-03-04 VITALS — BP 140/70 | HR 69 | Temp 97.8°F | Ht 62.0 in | Wt 225.4 lb

## 2012-03-04 DIAGNOSIS — R609 Edema, unspecified: Secondary | ICD-10-CM | POA: Insufficient documentation

## 2012-03-04 DIAGNOSIS — I10 Essential (primary) hypertension: Secondary | ICD-10-CM

## 2012-03-04 DIAGNOSIS — J310 Chronic rhinitis: Secondary | ICD-10-CM

## 2012-03-04 LAB — BASIC METABOLIC PANEL
BUN: 11 mg/dL (ref 6–23)
Chloride: 102 mEq/L (ref 96–112)
Potassium: 4 mEq/L (ref 3.5–5.1)
Sodium: 142 mEq/L (ref 135–145)

## 2012-03-04 LAB — HEPATIC FUNCTION PANEL
ALT: 32 U/L (ref 0–35)
AST: 29 U/L (ref 0–37)
Alkaline Phosphatase: 54 U/L (ref 39–117)
Bilirubin, Direct: 0.1 mg/dL (ref 0.0–0.3)
Total Bilirubin: 0.6 mg/dL (ref 0.3–1.2)

## 2012-03-04 MED ORDER — MOMETASONE FUROATE 50 MCG/ACT NA SUSP: 2.0000 | Freq: Every day | NASAL | Status: DC

## 2012-03-04 MED ORDER — VALSARTAN-HYDROCHLOROTHIAZIDE 160-12.5 MG PO TABS: 1.0000 | ORAL_TABLET | Freq: Every day | ORAL | Status: DC

## 2012-03-04 NOTE — Patient Instructions (Addendum)
Please remember to go to the lab  department downstairs for your tests - we will call you with the results when they are available.  Resume diovan and stop hyzaar (losartan)  Remember that nasal steroids (like nasonex) have no immediate benefit in terms of improving symptoms.  To help them reached the target tissue, the patient should use Afrin two puffs every 12 hours applied one min before using the nasal steroids.  Afrin should be stopped after no more than 5 days.  If the symptoms worsen, Afrin can be restarted after 5 days off of therapy to prevent rebound congestion from overuse of Afrin.     Please schedule a follow up visit in 3 months but call sooner if needed for CPX on return

## 2012-03-04 NOTE — Telephone Encounter (Signed)
Called and spoke with pt about her lab results per MW.  Pt voiced her understanding of these results and nothing further needed.

## 2012-03-04 NOTE — Assessment & Plan Note (Signed)
Chronic L > R suggesting venous insufficiency plus effects of obesity. No chf based on symptoms and bnp << 100 at onset  Recheck renal fxn, tsh, alb to be completel  Rec elastic support hose, elevation, and change back to diovan

## 2012-03-04 NOTE — Progress Notes (Signed)
Subjective:    Patient ID: [name], female    DOB: [birth year] .   MRN: [id number]   Brief patient profile:  34 yowf never smoker with morbid obesity complicated by gastroesophageal reflux disease, and also a history of obstructive sleep apnea and chronic rhinitis/ hbp.   HPI April 05, 2008 ov: new onset sensation something stuck in her throat with no dysphagia or fever and more of a tickle than a pain, resolved on short term pepcid at bedtime.   September 10, 2009--Returns for follow up and med review. Last visit flare in cough, Tx w/ steroids and aggressive GERD/Rhinitis prevention, and cough control. She is much improved w/ total resolution of cough.     01/11/2012 Acute OV  Presents for work in visit. Has noted elevated blood pressure x  2 weeks.  B/p has been trending up x few months.  Also has edema in ankles , worse in evening . No calf pain or redness. No fever.  Feels fine . No headache or visual changes .  No new meds .  No decongestants  rec Begin Diovan HCT 160/12.5mg  daily  Low salt diet Exercise as tolerated.  Legs elevated.  Weight loss    03/04/2012 f/u ov/[name] cc progressively worsening leg swelling L > R since changed diovan to hyzaar sev weeks prior to OV  .  Better overnight p started diovan but then p changed to hyzaar noted recurrent am edema, worsens as day goes on, no assoc cp or sob.  Denies using much mobic at all, watching salt.  Nasal symptoms better, not using nasonex or any nasal prns while on maint ppi qam and h2hs  Sleeping ok without nocturnal  or early am exacerbation  of respiratory  c/o's or need for noct saba. Also denies any obvious fluctuation of symptoms with weather or environmental changes or other aggravating or alleviating factors except as outlined above   ROS  The following are not active complaints unless bolded sore throat, dysphagia, dental problems, itching, sneezing,  nasal congestion or excess/ purulent secretions, ear ache,    fever, chills, sweats, unintended wt loss, pleuritic or exertional cp, hemoptysis,  orthopnea pnd or leg swelling, presyncope, palpitations, heartburn, abdominal pain, anorexia, nausea, vomiting, diarrhea  or change in bowel or urinary habits, change in stools or urine, dysuria,hematuria,  rash, arthralgias, visual complaints, headache, numbness weakness or ataxia or problems with walking or coordination,  change in mood/affect or memory.         Past Medical History:  HIATAL HERNIA (ICD-553.3) with GERD  - H/o Barretts esophagus  - EGD pos HH/ stricture 04/18/10 , neg barrett's  RHINITIS, CHRONIC (ICD-472.0)  - Remote allergy testing = molds/dust only  - Sinus CT 09/06/08 mild thickending only  OBSTRUCTIVE SLEEP APNEA (ICD-327.23)..................................................[name]  --PSG 03/31/02 AHI 64  --Nocturnal BIPAP  MORBID OBESITY (ICD-278.01)  - Target wt = 158 for BMI < 30  POSITIVE PPD (ICD-795.5)  COUGH (ICD-786.2)  DIVERTICULOSIS  HEALTH MAINTENANCE........................................................Marland KitchenWert  - DT May 23, 2009  - Pneumovax 04/2006 - CPX 05/25/2011  - GYN Levoix  Complex med regimen  -------Meds reviewed with pt education and computerized med calendar completed September 10, 2009   Family History:  Father - CAD, ? 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  No etoh         .          Objective:   Physical Exam  wt 224 May 23, 2009 > 226 September 10, 2009 > 229 November 05, 2009 > 232 June 03, 2010 > 224 05/25/2011 >230 01/11/12 > 03/04/2012   225  Ambulatory obese  wf in no acute distress.  HEENT: nl dentition, and orophanx. Nl external ear canals without cough reflex, elongated uvula, thick tongue.  Mod bilateral turbinate edema mucoid secretions no polyps seenl  Neck without JVD/Nodes/TM  Lungs clear to A and P bilaterally without cough on insp or exp maneuvers  RRR no s3 or murmur or increase in P2. Tr  edema  R.  1+ Left Abd soft and obese but benign with nl excursion in the supine position. No bruits or organomegaly  Ext warm without calf tenderness, cyanosis clubbing  Skin warm and dry without lesions          Assessment & Plan:

## 2012-03-04 NOTE — Assessment & Plan Note (Signed)
-   Remote allergy testing = molds/dust only  - Sinus CT 09/06/08 mild thickening only    Symptoms much better with rx of GERD > reminded re using nasonex "prn" she'll probably need to pretreat x first five days with afrin  See instructions for specific recommendations which were reviewed directly with the patient who was given a copy with highlighter outlining the key components.

## 2012-03-04 NOTE — Assessment & Plan Note (Signed)
Marginal Adequate control on present rx, reviewed options, did better on diovan/hct but thought it was causing sinus symptoms which turned out to be due to tooth infection and willing to rechallenge with it since it worked better than hyzar both for the bp and the swelling

## 2012-04-04 ENCOUNTER — Telehealth: Payer: Self-pay | Admitting: Internal Medicine

## 2012-04-04 NOTE — Telephone Encounter (Signed)
lmomtcb  

## 2012-04-04 NOTE — Telephone Encounter (Signed)
Needs to call Evelene Croon - there really should only be one doctor prescribing psychotropics. Send copy of this phone call to [facility]

## 2012-04-04 NOTE — Telephone Encounter (Signed)
Pt returned call.  Holly D Pryor ° °

## 2012-04-04 NOTE — Telephone Encounter (Signed)
Called, spoke with pt who states over the past 2-3 wks her anxiety level has been "out the roof.'  Reports everything bothers her, she stays irritated, and "can't deal with anything."  States the only time she can feel calm is when she gets away from work and home.  Is on cymbalta 60 mg qd and xanax 0.25 mg prn (taking this approx 2-3 times weekly now).  Has seen Dr. Orland Mustard in the past and feels he is great to talk to but he can't rx meds.  Sees Dr. Evelene Croon once yearly for cymbalta and xanax rxs.  Would like to know what Dr. [name] recs she do from this standpoint - ? See Dr. Orland Mustard, Dr. Evelene Croon, or here with him or TP.  Dr. [name], pls advise.  Thank you.

## 2012-04-04 NOTE — Telephone Encounter (Signed)
LMOMTCB x 1 

## 2012-04-05 NOTE — Telephone Encounter (Signed)
Returning call can be reached at [phone].[name]

## 2012-04-05 NOTE — Telephone Encounter (Signed)
Called, spoke with pt.   I informed her of below per Dr. [name]. She verbalized understanding of this and stated she would call Dr. Evelene Croon.  She is aware I will also send copy of this msg to Dr. Evelene Croon.  Nothing further needed at this time.    Msg printed and faxed to Dr. Carie Caddy office.

## 2012-04-19 ENCOUNTER — Other Ambulatory Visit: Payer: Self-pay | Admitting: Gastroenterology

## 2012-04-20 NOTE — Telephone Encounter (Signed)
NEEDS OFFICE VISIT FOR ANY FURTHER REFILLS! 

## 2012-04-27 ENCOUNTER — Ambulatory Visit: Payer: PRIVATE HEALTH INSURANCE | Admitting: Gastroenterology

## 2012-05-13 ENCOUNTER — Encounter: Payer: PRIVATE HEALTH INSURANCE | Admitting: Internal Medicine

## 2012-05-17 ENCOUNTER — Encounter: Payer: Self-pay | Admitting: Internal Medicine

## 2012-05-17 ENCOUNTER — Other Ambulatory Visit (INDEPENDENT_AMBULATORY_CARE_PROVIDER_SITE_OTHER): Payer: PRIVATE HEALTH INSURANCE

## 2012-05-17 ENCOUNTER — Ambulatory Visit (INDEPENDENT_AMBULATORY_CARE_PROVIDER_SITE_OTHER): Payer: PRIVATE HEALTH INSURANCE | Admitting: Internal Medicine

## 2012-05-17 ENCOUNTER — Ambulatory Visit (INDEPENDENT_AMBULATORY_CARE_PROVIDER_SITE_OTHER)
Admission: RE | Admit: 2012-05-17 | Discharge: 2012-05-17 | Disposition: A | Discharge status: Home / Self Care | Payer: PRIVATE HEALTH INSURANCE | Source: Ambulatory Visit | Attending: Internal Medicine | Admitting: Internal Medicine

## 2012-05-17 VITALS — BP 118/70 | HR 62 | Temp 97.5°F | Ht 62.0 in | Wt 219.2 lb

## 2012-05-17 DIAGNOSIS — I10 Essential (primary) hypertension: Secondary | ICD-10-CM

## 2012-05-17 DIAGNOSIS — Z0000 Encounter for general adult medical examination without abnormal findings: Secondary | ICD-10-CM

## 2012-05-17 DIAGNOSIS — M542 Cervicalgia: Secondary | ICD-10-CM

## 2012-05-17 DIAGNOSIS — M8580 Other specified disorders of bone density and structure, unspecified site: Secondary | ICD-10-CM

## 2012-05-17 DIAGNOSIS — Z23 Encounter for immunization: Secondary | ICD-10-CM

## 2012-05-17 DIAGNOSIS — M899 Disorder of bone, unspecified: Secondary | ICD-10-CM

## 2012-05-17 DIAGNOSIS — E7800 Pure hypercholesterolemia, unspecified: Secondary | ICD-10-CM

## 2012-05-17 LAB — TSH: TSH: 0.98 u[IU]/mL (ref 0.35–5.50)

## 2012-05-17 LAB — URINALYSIS
Ketones, ur: NEGATIVE
Leukocytes, UA: NEGATIVE
Specific Gravity, Urine: 1.01 (ref 1.000–1.030)
Urobilinogen, UA: 0.2 (ref 0.0–1.0)
pH: 7 (ref 5.0–8.0)

## 2012-05-17 LAB — CBC WITH DIFFERENTIAL/PLATELET
Basophils Absolute: 0.1 10*3/uL (ref 0.0–0.1)
Eosinophils Absolute: 0.1 10*3/uL (ref 0.0–0.7)
Lymphocytes Relative: 35.3 % (ref 12.0–46.0)
MCHC: 33.1 g/dL (ref 30.0–36.0)
MCV: 93.7 fl (ref 78.0–100.0)
Monocytes Absolute: 0.5 10*3/uL (ref 0.1–1.0)
Neutro Abs: 4.1 10*3/uL (ref 1.4–7.7)
Neutrophils Relative %: 56.6 % (ref 43.0–77.0)
RDW: 13.5 % (ref 11.5–14.6)

## 2012-05-17 LAB — BASIC METABOLIC PANEL
CO2: 29 mEq/L (ref 19–32)
Calcium: 9.5 mg/dL (ref 8.4–10.5)
Chloride: 98 mEq/L (ref 96–112)
Creatinine, Ser: 0.7 mg/dL (ref 0.4–1.2)
Glucose, Bld: 100 mg/dL — ABNORMAL HIGH (ref 70–99)

## 2012-05-17 LAB — LIPID PANEL
Cholesterol: 212 mg/dL — ABNORMAL HIGH (ref 0–200)
Total CHOL/HDL Ratio: 5
Triglycerides: 122 mg/dL (ref 0.0–149.0)

## 2012-05-17 LAB — HEPATIC FUNCTION PANEL
Albumin: 4.2 g/dL (ref 3.5–5.2)
Alkaline Phosphatase: 49 U/L (ref 39–117)
Bilirubin, Direct: 0.1 mg/dL (ref 0.0–0.3)
Total Protein: 7.3 g/dL (ref 6.0–8.3)

## 2012-05-17 LAB — LDL CHOLESTEROL, DIRECT: Direct LDL: 147.2 mg/dL

## 2012-05-17 NOTE — Progress Notes (Signed)
Quick Note:  Spoke with pt and notified of results per Dr. [name]. Pt verbalized understanding and denied any questions.  ______ 

## 2012-05-17 NOTE — Assessment & Plan Note (Signed)
Benign pattern, no radicular features > neck stretching, mobic rx reviewed

## 2012-05-17 NOTE — Assessment & Plan Note (Signed)
Adequate control on present rx, reviewed  

## 2012-05-17 NOTE — Assessment & Plan Note (Signed)
-   Dexa 06/15/11  Spine  0.2  L Fem Neck -1.1,   R Fem Neck -1.8  Vit d level ordered

## 2012-05-17 NOTE — Assessment & Plan Note (Signed)
Weight control thru neg cal balance reviewed

## 2012-05-17 NOTE — Progress Notes (Signed)
Subjective:    Patient ID: [name], female    DOB: August 02, 1946 .   MRN: [id number]   Brief patient profile:  6 yowf never smoker with morbid obesity complicated by gastroesophageal reflux disease, and also a history of obstructive sleep apnea and chronic rhinitis/ hbp.   HPI April 05, 2008 ov: new onset sensation something stuck in her throat with no dysphagia or fever and more of a tickle than a pain, resolved on short term pepcid at bedtime.   September 10, 2009--Returns for follow up and med review. Last visit flare in cough, Tx w/ steroids and aggressive GERD/Rhinitis prevention, and cough control. She is much improved w/ total resolution of cough.     01/11/2012 Acute OV  Presents for work in visit. Has noted elevated blood pressure x  2 weeks.  B/p has been trending up x few months.  Also has edema in ankles , worse in evening . No calf pain or redness. No fever.  Feels fine . No headache or visual changes .  No new meds .  No decongestants  rec Begin Diovan HCT 160/12.5mg  daily  Low salt diet Exercise as tolerated.  Legs elevated.  Weight loss    03/04/2012 f/u ov/Talyn Dessert cc progressively worsening leg swelling L > R since changed diovan to hyzaar sev weeks prior to OV  .  Better overnight p started diovan but then p changed to hyzaar noted recurrent am edema, worsens as day goes on, no assoc cp or sob.  Denies using much mobic at all, watching salt.  Nasal symptoms better, not using nasonex or any nasal prns while on maint ppi qam and h2hs rec Resume diovan and stop hyzaar (losartan) Remember that nasal steroids (like nasonex) have no immediate benefit   05/17/2012 f/u ov/Natally Ribera cc neck pain x 3 months, mostly on L no radiation, worse as day goes on, eval by Bean rec stretching and mobic,  Rarely uses mobic cause afraid of leg swelling   Sleeping ok without nocturnal  or early am exacerbation  of respiratory  c/o's or need for noct saba. Also denies any obvious  fluctuation of symptoms with weather or environmental changes or other aggravating or alleviating factors except as outlined above   ROS  The following are not active complaints unless bolded sore throat, dysphagia, dental problems, itching, sneezing,  nasal congestion or excess/ purulent secretions, ear ache,   fever, chills, sweats, unintended wt loss, pleuritic or exertional cp, hemoptysis,  orthopnea pnd or leg swelling, presyncope, palpitations, heartburn, abdominal pain, anorexia, nausea, vomiting, diarrhea  or change in bowel or urinary habits, change in stools or urine, dysuria,hematuria,  rash, arthralgias, visual complaints, headache, numbness weakness or ataxia or problems with walking or coordination,  change in mood/affect or memory.         Past Medical History:  HIATAL HERNIA (ICD-553.3) with GERD  - H/o Barretts esophagus  - EGD pos HH/ stricture 04/18/10 , neg barrett's  RHINITIS, CHRONIC (ICD-472.0)  - Remote allergy testing = molds/dust only  - Sinus CT 09/06/08 mild thickending only  OBSTRUCTIVE SLEEP APNEA (ICD-327.23)..................................................[name]  --PSG 03/31/02 AHI 64  --Nocturnal BIPAP  MORBID OBESITY (ICD-278.01)  - Target wt = 158 for BMI < 30  POSITIVE PPD (ICD-795.5)  DIVERTICULOSIS  HEALTH MAINTENANCE........................................................Marland KitchenWert  - DT May 23, 2009  - Pneumovax 04/2006 - CPX 05/17/2012  - GYN Levoix  Complex med regimen  -------Meds reviewed with pt education and computerized med calendar completed September 10, 2009  Family History:  Father - CAD, ? 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  No etoh         .          Objective:   Physical Exam  wt 224 May 23, 2009 > 226 September 10, 2009 > 229 November 05, 2009 > 232 June 03, 2010 > 224 05/25/2011 >230 01/11/12 > 03/04/2012  225 > 05/17/2012  219  Ambulatory obese  wf in no acute distress.  HEENT: nl dentition, and orophanx. Nl external ear canals without cough reflex, elongated uvula, thick tongue.  Mod bilateral turbinate edema mucoid secretions no polyps seenl  Neck without JVD/Nodes/TM  Lungs clear to A and P bilaterally without cough on insp or exp maneuvers  RRR no s3 or murmur or increase in P2. Tr  edema bilaterally Abd soft and obese but benign with nl excursion in the supine position. No bruits or organomegaly  Ext warm without calf tenderness, cyanosis clubbing  Skin warm and dry without lesions     CXR  05/17/2012 :  Borderline cardiomegaly. Mild degenerative changes thoracic spine. No active disease.       Assessment & Plan:

## 2012-05-17 NOTE — Patient Instructions (Addendum)
Please remember to go to the lab and x-ray department downstairs for your tests - we will call you with the results when they are available.    Remember to do the neck stretching every morning and evening, hold 20 secs in each of the 6 directions, and repeat the sequence x 1  Pneumovax given today  Please schedule a follow up visit in 6 months but call sooner if needed

## 2012-05-17 NOTE — Assessment & Plan Note (Signed)
LDL 147 so not ideal > diet / ex reviewed

## 2012-05-18 LAB — VITAMIN D 25 HYDROXY (VIT D DEFICIENCY, FRACTURES): Vit D, 25-Hydroxy: 28 ng/mL — ABNORMAL LOW (ref 30–89)

## 2012-05-18 NOTE — Progress Notes (Signed)
Quick Note:  Spoke with pt and notified of results per Dr. [name]. Pt verbalized understanding and denied any questions.  ______ 

## 2012-05-20 ENCOUNTER — Encounter: Payer: PRIVATE HEALTH INSURANCE | Admitting: Internal Medicine

## 2012-05-31 ENCOUNTER — Ambulatory Visit (INDEPENDENT_AMBULATORY_CARE_PROVIDER_SITE_OTHER): Payer: PRIVATE HEALTH INSURANCE | Admitting: Gastroenterology

## 2012-05-31 ENCOUNTER — Encounter: Payer: Self-pay | Admitting: Gastroenterology

## 2012-05-31 VITALS — BP 128/60 | HR 68 | Ht 62.0 in | Wt 221.8 lb

## 2012-05-31 DIAGNOSIS — K219 Gastro-esophageal reflux disease without esophagitis: Secondary | ICD-10-CM

## 2012-05-31 DIAGNOSIS — Z1211 Encounter for screening for malignant neoplasm of colon: Secondary | ICD-10-CM

## 2012-05-31 MED ORDER — PANTOPRAZOLE SODIUM 40 MG PO TBEC: 40.0000 mg | DELAYED_RELEASE_TABLET | Freq: Two times a day (BID) | ORAL | Status: DC

## 2012-05-31 NOTE — Patient Instructions (Addendum)
We have sent the following medications to your pharmacy for you to pick up at your convenience:   Protonix  

## 2012-05-31 NOTE — Progress Notes (Signed)
History of Present Illness: This is a 66 year old female who returns for followup of GERD. Her reflux symptoms are under very good control on daily pantoprazole. She occasionally takes Pepcid in the evening when she has an upper respiratory infection or bruits for reflux symptoms. She has a history of Barrett's changes noted on biopsy in 2006 however has no visible Barrett's and subsequent endoscopies have not shown Barrett's. She is no longer Barrett's surveillance program. Denies weight loss, abdominal pain, constipation, diarrhea, change in stool caliber, melena, hematochezia, nausea, vomiting, dysphagia, chest pain.  Review of Systems: Pertinent positive and negative review of systems were noted in the above HPI section. All other review of systems were otherwise negative.  Current Medications, Allergies, Past Medical History, Past Surgical History, Family History and Social History were reviewed in [employer] record.  Physical Exam: General: Well developed , well nourished, no acute distress Head: Normocephalic and atraumatic Eyes:  sclerae anicteric, EOMI Ears: Normal auditory acuity Mouth: No deformity or lesions Neck: Supple, no masses or thyromegaly Lungs: Clear throughout to auscultation Heart: Regular rate and rhythm; no murmurs, rubs or bruits Abdomen: Soft, non tender and non distended. No masses, hepatosplenomegaly or hernias noted. Normal Bowel sounds Musculoskeletal: Symmetrical with no gross deformities  Skin: No lesions on visible extremities Pulses:  Normal pulses noted Extremities: No clubbing, cyanosis, edema or deformities noted Neurological: Alert oriented x 4, grossly nonfocal Cervical Nodes:  No significant cervical adenopathy Inguinal Nodes: No significant inguinal adenopathy Psychological:  Alert and cooperative. Normal mood and affect  Assessment and Recommendations:  1. GERD. History of an esophageal stricture. Prior esophageal stricture  biopsies showed Barrett's however there was no visible Barrett's. No longer needs Barrett's surveillance. Continue standard antireflux measures and pantoprazole 40 mg twice daily. May use famotidine 20 mg in the evening as needed for breakthrough symptoms.   2. Obesity. Long-term weight loss program supervised by her primary physician is recommended.   3. Colorectal cancer screening. Screening colonoscopy due April 2015.

## 2012-06-02 ENCOUNTER — Encounter: Payer: Self-pay | Admitting: Internal Medicine

## 2012-07-29 ENCOUNTER — Ambulatory Visit (INDEPENDENT_AMBULATORY_CARE_PROVIDER_SITE_OTHER): Payer: PRIVATE HEALTH INSURANCE | Admitting: Adult Health

## 2012-07-29 ENCOUNTER — Encounter: Payer: Self-pay | Admitting: Adult Health

## 2012-07-29 VITALS — BP 128/64 | HR 84 | Temp 97.6°F | Ht 62.0 in | Wt 217.6 lb

## 2012-07-29 DIAGNOSIS — M25559 Pain in unspecified hip: Secondary | ICD-10-CM

## 2012-07-29 NOTE — Progress Notes (Signed)
Subjective:    Patient ID: [name], female    DOB: [birth year] .   MRN: [id number]   Brief patient profile:  62 yowf never smoker with morbid obesity complicated by gastroesophageal reflux disease, and also a history of obstructive sleep apnea and chronic rhinitis/ hbp.   HPI April 05, 2008 ov: new onset sensation something stuck in her throat with no dysphagia or fever and more of a tickle than a pain, resolved on short term pepcid at bedtime.   September 10, 2009--Returns for follow up and med review. Last visit flare in cough, Tx w/ steroids and aggressive GERD/Rhinitis prevention, and cough control. She is much improved w/ total resolution of cough.     01/11/2012 Acute OV  Presents for work in visit. Has noted elevated blood pressure x  2 weeks.  B/p has been trending up x few months.  Also has edema in ankles , worse in evening . No calf pain or redness. No fever.  Feels fine . No headache or visual changes .  No new meds .  No decongestants  rec Begin Diovan HCT 160/12.5mg  daily  Low salt diet Exercise as tolerated.  Legs elevated.  Weight loss    03/04/2012 f/u ov/[name] cc progressively worsening leg swelling L > R since changed diovan to hyzaar sev weeks prior to OV  .  Better overnight p started diovan but then p changed to hyzaar noted recurrent am edema, worsens as day goes on, no assoc cp or sob.  Denies using much mobic at all, watching salt.  Nasal symptoms better, not using nasonex or any nasal prns while on maint ppi qam and h2hs rec Resume diovan and stop hyzaar (losartan) Remember that nasal steroids (like nasonex) have no immediate benefit   05/17/2012 f/u ov/[name] cc neck pain x 3 months, mostly on L no radiation, worse as day goes on, eval by [facility] rec stretching and mobic,  Rarely uses mobic cause afraid of leg swelling   07/29/2012 Acute OV  Complains of right hip discomfort at night, wakes her up at night x 1 month.    Achy and stiff in outer part of  right hip.  No known injury .  No chest pain or dyspnea. No urinary symptoms .  Taking tylenol without much help Not taking mobic.      Past Medical History:  HIATAL HERNIA (ICD-553.3) with GERD  - H/o Barretts esophagus  - EGD pos HH/ stricture 04/18/10 , neg barrett's  RHINITIS, CHRONIC (ICD-472.0)  - Remote allergy testing = molds/dust only  - Sinus CT 09/06/08 mild thickending only  OBSTRUCTIVE SLEEP APNEA (ICD-327.23)..................................................[name]  --PSG 03/31/02 AHI 64  --Nocturnal BIPAP  MORBID OBESITY (ICD-278.01)  - Target wt = 158 for BMI < 30  POSITIVE PPD (ICD-795.5)  DIVERTICULOSIS  HEALTH MAINTENANCE........................................................Marland KitchenWert  - DT May 23, 2009  - Pneumovax 04/2006 - CPX 05/17/2012  - GYN Levoix  Complex med regimen  -------Meds reviewed with pt education and computerized med calendar completed September 10, 2009      Family History:  Father - CAD, ? 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  Never smoker,  but Husband smokes  Illicit Drug Use - no  Patient does not get regular exercise.  No etoh         .          Objective:   Physical Exam  wt 224 May 23, 2009 > 226 September 10, 2009 > 229 November 05, 2009 > 232 June 03, 2010 > 224 05/25/2011 >230 01/11/12 > 03/04/2012  225 > 05/17/2012  219 >217 07/29/2012  Ambulatory obese  wf in no acute distress.  HEENT: nl dentition, and orophanx. Nl external ear canals without cough reflex, elongated uvula, thick tongue.  Mod bilateral turbinate edema mucoid secretions no polyps seenl  Neck without JVD/Nodes/TM  Lungs clear to A and P  bilaterally without cough on insp or exp maneuvers  RRR no s3 or murmur or increase in P2. Tr  edema bilaterally Abd soft and obese but benign with nl excursion in the supine position. No bruits or organomegaly  Ext warm without calf tenderness, cyanosis clubbing  Internal /ext rotation of right hip mild pain, neg SLR . Left ROM of leg/hip nml . No pain.  No deformity noted.  Skin warm and dry without lesions     CXR  05/17/2012 :  Borderline cardiomegaly. Mild degenerative changes thoracic spine. No active disease.       Assessment & Plan:

## 2012-07-29 NOTE — Patient Instructions (Addendum)
Mobic daily for 2 weeks then As needed  Joint pain .  Alternate ice and heat As needed   Advance activity as tolerated and As needed   Please contact office for sooner follow up if symptoms do not improve or worsen or seek emergency care

## 2012-07-29 NOTE — Assessment & Plan Note (Signed)
Right hip pain -DJD vs bursitis  Plan  Mobic daily for 2 weeks then As needed  Joint pain .  Alternate ice and heat As needed   Advance activity as tolerated and As needed   Please contact office for sooner follow up if symptoms do not improve or worsen or seek emergency care

## 2012-08-25 ENCOUNTER — Telehealth: Payer: Self-pay | Admitting: Internal Medicine

## 2012-08-25 MED ORDER — AZITHROMYCIN 250 MG PO TABS: ORAL_TABLET | ORAL | Status: DC

## 2012-08-25 NOTE — Telephone Encounter (Signed)
Called, spoke with pt.  C/o slight cough, nasal congestin with green mucus, head congestion, sinus pressure/HA x 1 wks.  Denies increased SOB, wheezing, chest tightness, chest pain, or f/c/s.  Requesting OV tomorrow.  Was scheduled with Dr. Frederico Hamman but appt was cancelled as she is a MW PCP pt.  MW is off -- Jess, pls advise if pt can be worked in with [employer].  Thanks!

## 2012-08-25 NOTE — Telephone Encounter (Signed)
Per TP: okay for zpak #1 take as directed no refills, mucinex 1-2 bid prn for congestion w/ plenty of fluids, saline nasal spray 2 puffs every 4-6 hours as needed.  If no better or worse call the office or seek emergency care.  Called spoke with patient, advised of TP's recs as stated above.  Pt okay with these recs and verbalized her understanding.  Rx sent to verified pharmacy.  Pt aware to call back for follow up if her symptoms do not improve or worsen.  Nothing further needed at this time, will sign off.

## 2012-08-26 ENCOUNTER — Ambulatory Visit: Payer: PRIVATE HEALTH INSURANCE | Admitting: Pulmonary Disease

## 2012-08-30 ENCOUNTER — Telehealth: Payer: Self-pay | Admitting: Internal Medicine

## 2012-08-30 DIAGNOSIS — M25559 Pain in unspecified hip: Secondary | ICD-10-CM

## 2012-08-30 NOTE — Telephone Encounter (Signed)
Refer to Ortho ASAP  Will most likely need xray  If not able to get into ortho soon  Can place on my schedule with xray  Please contact office for sooner follow up if symptoms do not improve or worsen or seek emergency care

## 2012-08-30 NOTE — Telephone Encounter (Signed)
Called, spoke with pt.  She was seen by TP on 07/29/12:  Patient Instructions     Mobic daily for 2 weeks then As needed Joint pain .  Alternate ice and heat As needed  Advance activity as tolerated and As needed  Please contact office for sooner follow up if symptoms do not improve or worsen or seek emergency care    -----  Pt states the pain in her right hip hasn't gotten any better.  States is has been hurting "most of the day now" for the last couple of days.  She has been using the mobic daily and tylenol with short term relief.  After medication wears off, pain returns.  Also, has been alternating ice and heat as needed.  Offered OV for tomorrow.  Pt states she would like to see if something can be done over the phone first.  As TP seen pt last for this problem, will route msg to her.  Tammy, can you pls advise.  Thank you.  ** Pt states she will be at above # until around 4:30pm.  After this time, pls call her cell at [phone].

## 2012-08-30 NOTE — Telephone Encounter (Signed)
Called, spoke with pt.  Informed her of below per TP.  She verbalized understanding of this and would like to be seen at [facility] if possible as she has been seen there in the past.  Pt is aware to call back for OV with TP if she is not given an ASAP appt with ortho.  She verbalized understanding and voiced no further questions or concerns at this time.  I spoke with Almyra Free.  She is aware pt would like the ortho appt at [facility] if possible.  Advised Libby if pt cannot get an ASAP pt, she will then need to be put on TP's schedule.  She verbalized understanding.

## 2012-08-31 NOTE — Telephone Encounter (Signed)
Pt returned call, she stated that she has an appt with [facility] on 1.13.14 and she is okay with this date.  Pt aware to call should she need anything further.  Will sign off.

## 2012-08-31 NOTE — Telephone Encounter (Signed)
LMOM TCB x1 - to follow up.

## 2012-09-06 ENCOUNTER — Encounter: Payer: Self-pay | Admitting: Internal Medicine

## 2012-09-06 ENCOUNTER — Ambulatory Visit (INDEPENDENT_AMBULATORY_CARE_PROVIDER_SITE_OTHER): Payer: PRIVATE HEALTH INSURANCE | Admitting: Internal Medicine

## 2012-09-06 VITALS — BP 120/70 | HR 69 | Temp 97.6°F | Ht 62.0 in | Wt 219.0 lb

## 2012-09-06 DIAGNOSIS — I10 Essential (primary) hypertension: Secondary | ICD-10-CM

## 2012-09-06 DIAGNOSIS — J310 Chronic rhinitis: Secondary | ICD-10-CM

## 2012-09-06 MED ORDER — MOMETASONE FUROATE 50 MCG/ACT NA SUSP: 2.0000 | Freq: Every day | NASAL | Status: DC

## 2012-09-06 NOTE — Progress Notes (Signed)
Subjective:    Patient ID: [name], female    DOB: 12-25-1945 .   MRN: [id number]   Brief patient profile:  10 yowf never smoker with morbid obesity complicated by gastroesophageal reflux disease, and also a history of obstructive sleep apnea and chronic rhinitis/ hbp.   HPI April 05, 2008 ov: new onset sensation something stuck in her throat with no dysphagia or fever and more of a tickle than a pain, resolved on short term pepcid at bedtime.   September 10, 2009--Returns for follow up and med review. Last visit flare in cough, Tx w/ steroids and aggressive GERD/Rhinitis prevention, and cough control. She is much improved w/ total resolution of cough.         03/04/2012 f/u ov/[name] cc progressively worsening leg swelling L > R since changed diovan to hyzaar sev weeks prior to OV  .  Better overnight p started diovan but then p changed to hyzaar noted recurrent am edema, worsens as day goes on, no assoc cp or sob.  Denies using much mobic at all, watching salt.  Nasal symptoms better, not using nasonex or any nasal prns while on maint ppi qam and h2hs rec Resume diovan and stop hyzaar (losartan) Remember that nasal steroids (like nasonex) have no immediate benefit    09/06/2012 f/u ov/[name] cc worse runny nose since cold weather and running out of nasonex "because you didn't refill it" (no evidence it was requested in chart review)  - discharge is watery, assoc with sneezing and nasal congestion.  Denies non-adherence to gerd rx  Sleeping ok without nocturnal  or early am exacerbation  of respiratory  c/o's or need for noct saba. Also denies any obvious fluctuation of symptoms with weather or environmental changes or other aggravating or alleviating factors except as outlined above   ROS  The following are not active complaints unless bolded sore throat, dysphagia, dental problems, itching, sneezing,  nasal congestion or excess watery discharge/ purulent secretions, ear ache,    fever, chills, sweats, unintended wt loss, pleuritic or exertional cp, hemoptysis,  orthopnea pnd or leg swelling, presyncope, palpitations, heartburn, abdominal pain, anorexia, nausea, vomiting, diarrhea  or change in bowel or urinary habits, change in stools or urine, dysuria,hematuria,  rash, arthralgias both hips, visual complaints, headache, numbness weakness or ataxia or problems with walking or coordination,  change in mood/affect or memory.           Past Medical History:  HIATAL HERNIA (ICD-553.3) with GERD  - H/o Barretts esophagus  - EGD pos HH/ stricture 04/18/10 , neg barrett's  RHINITIS, CHRONIC (ICD-472.0)  - Remote allergy testing = molds/dust only  - Sinus CT 09/06/08 mild thickending only  OBSTRUCTIVE SLEEP APNEA (ICD-327.23)..................................................[name]  --PSG 03/31/02 AHI 64  --Nocturnal BIPAP  MORBID OBESITY (ICD-278.01)  - Target wt = 158 for BMI < 30  POSITIVE PPD (ICD-795.5)  DIVERTICULOSIS  HEALTH MAINTENANCE........................................................Marland KitchenWert  - DT May 23, 2009  - Pneumovax 04/2006 - CPX 05/17/2012  - GYN Levoix  Complex med regimen  -------Meds reviewed with pt education and computerized med calendar completed September 10, 2009      Family History:  Father - CAD, ? 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  No etoh         .          Objective:   Physical Exam  wt 224 May 23, 2009 > 226 September 10, 2009 > 229 November 05, 2009 > 232 June 03, 2010 > 224 05/25/2011 >230 01/11/12 > 03/04/2012   225 > 05/17/2012  219 >217 07/29/2012 > 219 09/06/2012  Ambulatory obese  wf in no acute distress.  HEENT: nl dentition, and orophanx. Nl external ear canals without cough reflex, elongated uvula, thick tongue.  Mod bilateral turbinate edema mucoid secretions no polyps seenl  Neck without JVD/Nodes/TM  Lungs clear to A and P bilaterally without cough on insp or exp maneuvers  RRR no s3 or murmur or increase in P2. Tr  edema bilaterally Abd soft and obese but benign with nl excursion in the supine position. No bruits or organomegaly  Ext warm without calf tenderness, cyanosis clubbing  Internal /ext rotation of right hip mild pain, neg SLR . Left ROM of leg/hip nml . No pain.  No deformity noted.  Skin warm and dry without lesions     CXR  05/17/2012 :  Borderline cardiomegaly. Mild degenerative changes thoracic spine. No active disease.       Assessment & Plan:

## 2012-09-06 NOTE — Patient Instructions (Signed)
I emphasized that nasal steroids (nasonex) have no immediate benefit in terms of improving symptoms.  To help them reached the target tissue, the patient should use Afrin two puffs every 12 hours applied one min before using the nasal steroids.  Afrin should be stopped after no more than 5 days.  If the symptoms worsen, Afrin can be restarted after 5 days off of therapy to prevent rebound congestion from overuse of Afrin.  I also emphasized that in no way are nasal steroids a concern in terms of "addiction".   If doing fine continue nasonex, if want the dymista called for rx (dose is one puff twice daily)  Keep appt for March 2014

## 2012-09-07 ENCOUNTER — Telehealth: Payer: Self-pay | Admitting: Internal Medicine

## 2012-09-07 MED ORDER — FLUTICASONE PROPIONATE 50 MCG/ACT NA SUSP: 2.0000 | Freq: Every day | NASAL | Status: DC

## 2012-09-07 NOTE — Telephone Encounter (Signed)
Change to Fluticasone-same sig.    LMTCB to let patient know this. RX has not been sent yet.

## 2012-09-07 NOTE — Telephone Encounter (Signed)
Called and spoke with pt and she is aware of the change from nasonex to fluticasone since her insurance will not cover the nasonex.  New rx has been sent in and pt will call back if any problems.  Med list has been updated.

## 2012-09-07 NOTE — Assessment & Plan Note (Signed)
-   Target wt 158 for bmi < 30  Discussed: Weight control is simply a matter of calorie balance which needs to be tilted in your favor by eating less and exercising more.  To get the most out of exercise, you need to be continuously aware that you are short of breath, but never out of breath, for 30 minutes daily. As you improve, it will actually be easier for you to do the same amount of exercise  in  30 minutes so always push to the level where you are short of breath.  If this does not result in gradual weight reduction then I strongly recommend you see a nutritionist with a food diary x 2 weeks so that we can work out a negative calorie balance which is universally effective in steady weight loss programs.  Think of your calorie balance like you do your bank account where in this case you want the balance to go down so you must take in less calories than you burn up.  It's just that simple:  Hard to do, but easy to understand.  Good luck!

## 2012-09-07 NOTE — Assessment & Plan Note (Signed)
-   Remote allergy testing = molds/dust only  - Sinus CT 09/06/08 mild thickening only    Since mold is a perennial allergen important to continue to suppress nasal inflammation year round.  Chart review indicates multiple similar discussions but she continues to stop nasonex then call with flare of rhinitis symptoms.  Perhaps a drug with more immediate benefit like dymista would be better choice if her insurance would pay.  For now restart nasonex with afrin x 5 days only, try sample of dymista one bid next flare.

## 2012-09-07 NOTE — Telephone Encounter (Signed)
Pt returned call. [name]  

## 2012-09-07 NOTE — Assessment & Plan Note (Signed)
Adequate control on present rx, reviewed  

## 2012-09-15 ENCOUNTER — Other Ambulatory Visit: Payer: Self-pay | Admitting: Internal Medicine

## 2012-09-15 MED ORDER — VALSARTAN-HYDROCHLOROTHIAZIDE 160-12.5 MG PO TABS: 1.0000 | ORAL_TABLET | Freq: Every day | ORAL | Status: DC

## 2012-09-15 NOTE — Telephone Encounter (Signed)
Received fax refill request from [facility] for Valsartan-HCTZ 160-12.5mg  qd Last ov 1.14.14 w/ MW Refills sent

## 2012-09-19 ENCOUNTER — Telehealth: Payer: Self-pay | Admitting: Internal Medicine

## 2012-09-19 NOTE — Telephone Encounter (Signed)
Pt still c/o runny nose, sneezing.  Pt taking Zyrtec daily, flonase 2 sp bid and tried Afrin for 5 days.  Pt did receive Dymista sample at last ov and tried first dose last night.  Per Dr Thurston Hole ov note on 09-06-12, with next flare try Dymista 1 sp bid.  Pt will try this for the next few days and let us know if symptoms persist.

## 2012-09-22 ENCOUNTER — Telehealth: Payer: Self-pay | Admitting: Internal Medicine

## 2012-09-22 MED ORDER — PREDNISONE 10 MG PO TABS: ORAL_TABLET | ORAL | Status: DC

## 2012-09-22 NOTE — Telephone Encounter (Signed)
I spoke with pt. She c/o still having runny nose, sneezing a lot. She has tried flonase (bc her insurance would not cover nasonex), afrin, and is currently using dymista 1 puff BID. She is still taking zyrtec and mucinex daily. She states her nose "runs like a faucet". She is concerned bc she is around pt's all day. She is requesting further recs. Please advise MW thanks  Allergies  Allergen Reactions  . Epinephrine     REACTION: tachycardia

## 2012-09-22 NOTE — Telephone Encounter (Signed)
Pt added that if nurse calls her after 4pm, call her cell [phone]. [name]

## 2012-09-22 NOTE — Telephone Encounter (Signed)
Pt calling to see which nasal spray she should continue on. She says she cannot tell that the Dymista worked any better than the [employer] and she does have Flonase at home. Will forward to <MW to see if he has a preference on which one the pt needs to use. Pls advise.

## 2012-09-22 NOTE — Telephone Encounter (Signed)
Prednisone 10 mg take  4 each am x 2 days,   2 each am x 2 days,  1 each am x2days and stop  Also rec try change zyrtec to chlortrimeton 4 mg every 6 hours as needed

## 2012-09-22 NOTE — Telephone Encounter (Signed)
I spoke with pt and is aware of MW recs. Nothing further was needed

## 2012-09-22 NOTE — Telephone Encounter (Signed)
Ok to resume flonase then

## 2012-09-22 NOTE — Telephone Encounter (Signed)
Pt aware of recs from [facility] and aware of Pred taper Rx sent to pharmacy on file.

## 2012-10-03 ENCOUNTER — Other Ambulatory Visit: Payer: Self-pay | Admitting: Adult Health

## 2012-10-31 ENCOUNTER — Ambulatory Visit (INDEPENDENT_AMBULATORY_CARE_PROVIDER_SITE_OTHER): Payer: PRIVATE HEALTH INSURANCE | Admitting: Psychology

## 2012-11-11 ENCOUNTER — Ambulatory Visit: Payer: PRIVATE HEALTH INSURANCE | Admitting: Internal Medicine

## 2012-11-18 ENCOUNTER — Ambulatory Visit: Payer: PRIVATE HEALTH INSURANCE | Admitting: Internal Medicine

## 2012-11-29 ENCOUNTER — Ambulatory Visit (INDEPENDENT_AMBULATORY_CARE_PROVIDER_SITE_OTHER): Payer: PRIVATE HEALTH INSURANCE | Admitting: Psychology

## 2012-11-29 DIAGNOSIS — F341 Dysthymic disorder: Secondary | ICD-10-CM

## 2012-12-30 ENCOUNTER — Ambulatory Visit: Payer: PRIVATE HEALTH INSURANCE | Admitting: Internal Medicine

## 2013-02-03 ENCOUNTER — Ambulatory Visit: Payer: PRIVATE HEALTH INSURANCE | Admitting: Internal Medicine

## 2013-03-07 ENCOUNTER — Ambulatory Visit: Payer: PRIVATE HEALTH INSURANCE | Admitting: Adult Health

## 2013-03-16 ENCOUNTER — Encounter: Payer: Self-pay | Admitting: Adult Health

## 2013-03-16 ENCOUNTER — Ambulatory Visit (INDEPENDENT_AMBULATORY_CARE_PROVIDER_SITE_OTHER): Payer: PRIVATE HEALTH INSURANCE | Admitting: Adult Health

## 2013-03-16 ENCOUNTER — Other Ambulatory Visit (INDEPENDENT_AMBULATORY_CARE_PROVIDER_SITE_OTHER): Payer: PRIVATE HEALTH INSURANCE

## 2013-03-16 VITALS — BP 108/68 | HR 72 | Temp 97.2°F | Ht 61.5 in | Wt 223.0 lb

## 2013-03-16 DIAGNOSIS — R5383 Other fatigue: Secondary | ICD-10-CM

## 2013-03-16 DIAGNOSIS — R5381 Other malaise: Secondary | ICD-10-CM | POA: Insufficient documentation

## 2013-03-16 LAB — CBC WITH DIFFERENTIAL/PLATELET
Basophils Relative: 0.6 % (ref 0.0–3.0)
Eosinophils Absolute: 0.1 10*3/uL (ref 0.0–0.7)
Hemoglobin: 12.8 g/dL (ref 12.0–15.0)
MCHC: 33.6 g/dL (ref 30.0–36.0)
MCV: 93.1 fl (ref 78.0–100.0)
Monocytes Absolute: 0.5 10*3/uL (ref 0.1–1.0)
Neutro Abs: 4.5 10*3/uL (ref 1.4–7.7)
RBC: 4.09 Mil/uL (ref 3.87–5.11)

## 2013-03-16 LAB — BASIC METABOLIC PANEL
BUN: 14 mg/dL (ref 6–23)
Creatinine, Ser: 0.8 mg/dL (ref 0.4–1.2)
GFR: 81.14 mL/min (ref >60.00)

## 2013-03-16 LAB — TSH: TSH: 0.86 u[IU]/mL (ref 0.35–5.50)

## 2013-03-16 NOTE — Patient Instructions (Addendum)
I will call with lab results  Stop Chlortrimeton for now  Advance exercise regimen as tolerated.  follow up Dr. [name]  In 2 months for physical  Please contact office for sooner follow up if symptoms do not improve or worsen or seek emergency care

## 2013-03-16 NOTE — Assessment & Plan Note (Signed)
Fatigue and malaise x6 weeks, questionable etiology, suspect may be a result of side effects from Chlor-Trimeton. Advised her to do Chlor-Trimeton for several days to see if her energy level improves. Patient continue on her CPAP machine at nighttime. We'll check labs today looking at CBC the med and a TSH level. Patient's advanced. Her activity level and exercise as tolerated. Patient return back here in 2 months for her physical and or sooner as needed. Please contact office for sooner follow up if symptoms do not improve or worsen or seek emergency care '

## 2013-03-16 NOTE — Progress Notes (Signed)
Subjective:    Patient ID: [name], female    DOB: 1946/05/05 .   MRN: [id number]   Brief patient profile:  67 yowf never smoker with morbid obesity complicated by gastroesophageal reflux disease, and also a history of obstructive sleep apnea and chronic rhinitis/ hbp.   HPI April 05, 2008 ov: new onset sensation something stuck in her throat with no dysphagia or fever and more of a tickle than a pain, resolved on short term pepcid at bedtime.   September 10, 2009--Returns for follow up and med review. Last visit flare in cough, Tx w/ steroids and aggressive GERD/Rhinitis prevention, and cough control. She is much improved w/ total resolution of cough.         03/04/2012 f/u ov/[name] cc progressively worsening leg swelling L > R since changed diovan to hyzaar sev weeks prior to OV  .  Better overnight p started diovan but then p changed to hyzaar noted recurrent am edema, worsens as day goes on, no assoc cp or sob.  Denies using much mobic at all, watching salt.  Nasal symptoms better, not using nasonex or any nasal prns while on maint ppi qam and h2hs rec Resume diovan and stop hyzaar (losartan) Remember that nasal steroids (like nasonex) have no immediate benefit    09/06/2012 f/u ov/[name] cc worse runny nose since cold weather and running out of nasonex "because you didn't refill it" (no evidence it was requested in chart review)  - discharge is watery, assoc with sneezing and nasal congestion.  Denies non-adherence to gerd rx >>  03/16/2013 Acute OV  Complains of fatigue/maliase x1 month - denies changes in breathing, no additional stressors.  Says over the last 4-6, weeks and she is noticed that she does not have any energy. He feels very tired throughout the day. Patient wears her CPAP machine every night for at least 6-8 hours without any noted difficulty. Patient denies any chest pain, palpitations, syncope, abdominal pain, nausea, vomiting, edema, or weight loss. Patient  says that her depression is improved since beginning Wellbutrin. Patient denies any heat or cold intolerances, weight gain. Takes chlortrimeton Twice daily  For allergy symptoms.    ROS  The following are not active complaints unless b sweats, unintended wt loss, pleuritic or exertional cp, hemoptysis,  orthopnea pnd or leg swelling, presyncope, palpitations, heartburn, abdominal pain, anorexia, nausea, vomiting, diarrhea  or change in bowel or urinary habits, change in stools or urine, dysuria,hematuria,  rash,  , visual complaints, headache, numbness weakness or ataxia or problems with walking or coordination,  change in mood/affect or memory.           Past Medical History:  HIATAL HERNIA (ICD-553.3) with GERD  - H/o Barretts esophagus  - EGD pos HH/ stricture 04/18/10 , neg barrett's  RHINITIS, CHRONIC (ICD-472.0)  - Remote allergy testing = molds/dust only  - Sinus CT 09/06/08 mild thickending only  OBSTRUCTIVE SLEEP APNEA (ICD-327.23)..................................................[name]  --PSG 03/31/02 AHI 64  --Nocturnal BIPAP  MORBID OBESITY (ICD-278.01)  - Target wt = 158 for BMI < 30  POSITIVE PPD (ICD-795.5)  DIVERTICULOSIS  HEALTH MAINTENANCE........................................................Marland KitchenWert  - DT May 23, 2009  - Pneumovax 04/2006 - CPX 05/17/2012  - GYN Levoix  Complex med regimen  -------Meds reviewed with pt education and computerized med calendar completed September 10, 2009      Family History:  Father - CAD, ? 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  Never smoker, but Husband smokes  Illicit Drug Use - no  Patient does not get regular exercise.   No etoh         .  ROS Neg except HIP         Objective:   Physical Exam  wt 224 May 23, 2009 > 226 September 10, 2009 > 229 November 05, 2009 > 232 June 03, 2010 > 224 05/25/2011 >230 01/11/12 > 03/04/2012  225 > 05/17/2012  219 >217 07/29/2012 > 219 09/06/2012 >223 03/16/2013  Ambulatory obese  wf in no acute distress.  HEENT: nl dentition, and orophanx. Nl external ear canals without cough reflex, elongated uvula, thick tongue.  Neck without JVD/Nodes/TM  Lungs clear to A and P bilaterally without cough on insp or exp maneuvers  RRR no s3 or murmur or increase in P2. Tr  edema bilaterally Abd soft and obese but benign with nl excursion in the supine position. No bruits or organomegaly  Ext warm without calf tenderness, cyanosis clubbing    No deformity noted.  Skin warm and dry without lesions     CXR  05/17/2012 :  Borderline cardiomegaly. Mild degenerative changes thoracic spine. No active disease.       Assessment & Plan:

## 2013-03-17 ENCOUNTER — Other Ambulatory Visit: Payer: Self-pay | Admitting: Adult Health

## 2013-03-17 MED ORDER — POTASSIUM CHLORIDE CRYS ER 20 MEQ PO TBCR: 20.0000 meq | EXTENDED_RELEASE_TABLET | Freq: Every day | ORAL | Status: DC

## 2013-03-17 NOTE — Progress Notes (Signed)
Quick Note:  Called spoke with patient, advised of lab results / recs as stated by TP. Pt verbalized her understanding and denied any questions. Orders only encounter created for [facility] rx. ______

## 2013-03-17 NOTE — Progress Notes (Signed)
Per 7.24.14 labs:  Notes Recorded by Sherre Lain, MA on 03/17/2013 at 5:30 PM Called spoke with patient, advised of lab results / recs as stated by TP. Pt verbalized her understanding and denied any questions. Orders only encounter created for [facility] rx.  Notes Recorded by Julio Sicks, NP on 03/17/2013 at 10:03 AM K+ is slightly low  rec [facility] #30 , 1 po daily , 5 refills  Rest of labs are normal  Nothing to explain fatigue symtpoms  Cont w/ ov recs Please contact office for sooner follow up if symptoms do not improve or worsen or seek emergency care

## 2013-03-27 ENCOUNTER — Telehealth: Payer: Self-pay | Admitting: Internal Medicine

## 2013-03-27 NOTE — Telephone Encounter (Signed)
Pt is aware of TP recs. Nothing further was needed.

## 2013-03-27 NOTE — Telephone Encounter (Signed)
I spoke with pt. She has been on potassium x 1 week per pt. Friday and Saturday she had diarrhea 4-5 times a day. She did not take this medication on Saturday bc she did not know what was going on. She took it again last night and woke up this morning very nauseated. She is wanting to know if these symptoms could be coming from the potassium. Please advise TP thanks

## 2013-03-27 NOTE — Telephone Encounter (Signed)
Should not cause it  May stop and have labs repeated on return  Please contact office for sooner follow up if symptoms do not improve or worsen or seek emergency care

## 2013-03-27 NOTE — Telephone Encounter (Signed)
Pt returned call. [name]  

## 2013-03-27 NOTE — Telephone Encounter (Signed)
LMTCB x1 for pt.  

## 2013-03-27 NOTE — Telephone Encounter (Signed)
Pt returned call

## 2013-03-27 NOTE — Telephone Encounter (Signed)
Pt returned triage's call [phone].  [name]

## 2013-03-27 NOTE — Telephone Encounter (Signed)
lmtcb x1 

## 2013-03-29 ENCOUNTER — Other Ambulatory Visit: Payer: Self-pay

## 2013-04-03 ENCOUNTER — Telehealth: Payer: Self-pay | Admitting: Internal Medicine

## 2013-04-03 MED ORDER — VALSARTAN-HYDROCHLOROTHIAZIDE 160-12.5 MG PO TABS: ORAL_TABLET | ORAL | Status: DC

## 2013-04-03 NOTE — Telephone Encounter (Signed)
Called pt back at # provided above x 2.  Both times received a "[employer].  Therefore, did not leave msg.  WCB.

## 2013-04-03 NOTE — Telephone Encounter (Signed)
Don't see where an electronic refill request was received by the office -- called [facility] and spoke with pharmacist Candice who reported that she received an electronic denial earlier today with no denial reason.  Pt was just seen by TP on 7.24.14 and has an upcoming cpx scheduled for 9.29.14.  Diovan-HCT given to [facility] verbally w/ 5 additional refills.  Med list updated.  Called spoke with patient, advised her of the above.  Pt stated that her fatigue/malaise are no better since ov w/ TP and stopping the chlor-tabs.  No additional symptoms to report.  She does mention that she has been exposed to mono at work.  TP not in office today or tomorrow.  Dr [name] please advise, thank you.  Pt is okay with a call back tomorrow.  Per 7.24.14 ov: Patient Instructions    I will call with lab results  Stop Chlortrimeton for now  Advance exercise regimen as tolerated.  follow up Dr. [name] In 2 months for physical  Please contact office for sooner follow up if symptoms do not improve or worsen or seek emergency care

## 2013-04-03 NOTE — Telephone Encounter (Signed)
2nd issue:  Pt states that her pharmacy called in a refill on diovan & that the Rx wasn't refilled & that our response that this medication isn't appropriate.  Please advise.  Antionette Fairy

## 2013-04-03 NOTE — Telephone Encounter (Signed)
Pt called back and asks that she now be called at [phone] as she is no longer at work #. [name]

## 2013-04-03 NOTE — Telephone Encounter (Signed)
See prev entry 

## 2013-04-03 NOTE — Telephone Encounter (Signed)
Ov with me w/in next 2 weeks to regroup

## 2013-04-04 NOTE — Telephone Encounter (Signed)
I spoke with pt and appt scheduled. Will sign off message

## 2013-04-11 ENCOUNTER — Encounter: Payer: Self-pay | Admitting: Internal Medicine

## 2013-04-11 ENCOUNTER — Ambulatory Visit (INDEPENDENT_AMBULATORY_CARE_PROVIDER_SITE_OTHER): Payer: PRIVATE HEALTH INSURANCE | Admitting: Internal Medicine

## 2013-04-11 VITALS — BP 122/74 | HR 66 | Temp 98.8°F | Ht 62.0 in | Wt 225.2 lb

## 2013-04-11 DIAGNOSIS — F329 Major depressive disorder, single episode, unspecified: Secondary | ICD-10-CM

## 2013-04-11 DIAGNOSIS — I10 Essential (primary) hypertension: Secondary | ICD-10-CM

## 2013-04-11 DIAGNOSIS — E876 Hypokalemia: Secondary | ICD-10-CM

## 2013-04-11 NOTE — Patient Instructions (Addendum)
.    To get the most out of exercise, you need to be continuously aware that you are short of breath, but never out of breath, for 30 minutes daily. As you improve, it will actually be easier for you to do the same amount of exercise  in  30 minutes so always push to the level where you are short of breath.    Ok to use chlortrimeton 4 mg every 6 hours as needed    Reschedule CPX for mid October Late add: needs pneumovax

## 2013-04-11 NOTE — Progress Notes (Signed)
Subjective:    Patient ID: [name], female    DOB: 02-06-1946 .   MRN: [id number]   Brief patient profile:  34 yowf never smoker with morbid obesity complicated by gastroesophageal reflux disease, and also a history of obstructive sleep apnea and chronic rhinitis/ hbp.   HPI  03/16/2013 Acute OV  Complains of fatigue/malaise x1 month - denies changes in breathing, no additional stressors.  Says over the last 4-6, weeks and she is noticed that she does not have any energy> feels very tired throughout the day. Patient wears her CPAP machine every night for at least 6-8 hours without any noted difficulty.  . Patient says that her depression is improved since beginning Wellbutrin.Takes chlortrimeton Twice daily  For allergy symptoms.  rec Labs > ok tsh, cbc, K 3.1 > Rx with KCL but caused diarrhea so stopped Stop Chlortrimeton for now    04/11/2013 f/u ov/[name] re fatigue Chief Complaint  Patient presents with  . Follow-up    fatigue unchanged.     no report of drowsiness and not better off h1  Sees Kaur next week Compliant with cpap  No obvious daytime variabilty or assoc chronic cough or cp or chest tightness, subjective wheeze overt sinus or hb symptoms. No unusual exp hx or h/o childhood pna/ asthma or knowledge of premature birth.   Sleeping ok without nocturnal  or early am exacerbation  of respiratory  c/o's or need for noct saba. Also denies any obvious fluctuation of symptoms with weather or environmental changes or other aggravating or alleviating factors except as outlined above  Current Medications, Allergies,Complete  Past Medical History, Past Surgical History, Family History, and Social History were reviewed in [employer] record.  ROS  The following are not active complaints unless bolded sore throat, dysphagia, dental problems, itching, sneezing,  nasal congestion or excess/ purulent secretions, ear ache,   fever, chills, sweats, unintended  wt loss, pleuritic or exertional cp, hemoptysis,  orthopnea pnd or leg swelling, presyncope, palpitations, heartburn, abdominal pain, anorexia, nausea, vomiting, diarrhea  or change in bowel or urinary habits, change in stools or urine, dysuria,hematuria,  rash, arthralgias, visual complaints, headache, numbness weakness or ataxia or problems with walking or coordination,  change in mood/affect or memory.                   Past Medical History:  HIATAL HERNIA (ICD-553.3) with GERD  - H/o Barretts esophagus  - EGD pos HH/ stricture 04/18/10 , neg barrett's  RHINITIS, CHRONIC (ICD-472.0)  - Remote allergy testing = molds/dust only  - Sinus CT 09/06/08 mild thickending only  OBSTRUCTIVE SLEEP APNEA (ICD-327.23)..................................................[name]  --PSG 03/31/02 AHI 64  --Nocturnal BIPAP  MORBID OBESITY (ICD-278.01)  - Target wt = 158 for BMI < 30  POSITIVE PPD (ICD-795.5)  DIVERTICULOSIS  HEALTH MAINTENANCE........................................................Marland KitchenWert  - DT May 23, 2009  - Pneumovax 04/2006 - CPX 05/17/2012  - GYN Levoix  Complex med regimen  -------Meds reviewed with pt education and computerized med calendar completed September 10, 2009      Family History:  Father - CAD, ? 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  No etoh         .           Objective:   Physical Exam  wt 224 May 23, 2009 > 226 September 10, 2009 > 229 November 05, 2009 > 232 June 03, 2010 > 224 05/25/2011 >230 01/11/12 > 03/04/2012  225 > 05/17/2012  219 >217  07/29/2012 > 219 09/06/2012 >223 03/16/2013 > 04/11/2013  225  Ambulatory obese  wf in no acute distress.  HEENT: nl dentition, and orophanx. Nl external ear canals without cough reflex, elongated uvula, thick tongue.  Neck without JVD/Nodes/TM  Lungs clear to A and P bilaterally without cough on insp or exp maneuvers  RRR no s3 or murmur or increase in P2. Tr  edema bilaterally Abd soft and obese but benign with nl excursion in the supine position. No bruits or organomegaly  Ext warm without calf tenderness, cyanosis clubbing    No deformity noted.  Skin warm and dry without lesions       Labs 03/16/13 reviewed with pt in detail sign only for K  3.1      Assessment & Plan:

## 2013-04-12 DIAGNOSIS — F32A Depression, unspecified: Secondary | ICD-10-CM | POA: Insufficient documentation

## 2013-04-12 DIAGNOSIS — E876 Hypokalemia: Secondary | ICD-10-CM | POA: Insufficient documentation

## 2013-04-12 DIAGNOSIS — F329 Major depressive disorder, single episode, unspecified: Secondary | ICD-10-CM | POA: Insufficient documentation

## 2013-04-12 NOTE — Assessment & Plan Note (Signed)
-   Target wt 158 for bmi < 30 Goal of neg cal balance again addressed with pt in detail  See instructions for specific recommendations which were reviewed directly with the patient who was given a copy with highlighter outlining the key components.

## 2013-04-12 NOTE — Assessment & Plan Note (Signed)
In absence of drowsiness and with nl labs/ cpap compliance this is most likely cause of fatigue  rec ok to restart H1's prn,  F/u with Dr Evelene Croon

## 2013-04-12 NOTE — Assessment & Plan Note (Signed)
Mild and intol of KCl > rx diet and recheck at cpx due in Oct 214

## 2013-04-12 NOTE — Assessment & Plan Note (Signed)
Adequate control on present rx, reviewed > no change in rx needed but complicated by mild hypokalemia > rx diet change

## 2013-05-19 ENCOUNTER — Encounter: Payer: PRIVATE HEALTH INSURANCE | Admitting: Internal Medicine

## 2013-05-22 ENCOUNTER — Ambulatory Visit (INDEPENDENT_AMBULATORY_CARE_PROVIDER_SITE_OTHER)
Admission: RE | Admit: 2013-05-22 | Discharge: 2013-05-22 | Disposition: A | Discharge status: Home / Self Care | Payer: PRIVATE HEALTH INSURANCE | Source: Ambulatory Visit | Attending: Internal Medicine | Admitting: Internal Medicine

## 2013-05-22 ENCOUNTER — Encounter: Payer: Self-pay | Admitting: Internal Medicine

## 2013-05-22 ENCOUNTER — Ambulatory Visit (INDEPENDENT_AMBULATORY_CARE_PROVIDER_SITE_OTHER): Payer: PRIVATE HEALTH INSURANCE | Admitting: Internal Medicine

## 2013-05-22 ENCOUNTER — Ambulatory Visit (INDEPENDENT_AMBULATORY_CARE_PROVIDER_SITE_OTHER): Payer: PRIVATE HEALTH INSURANCE

## 2013-05-22 VITALS — BP 122/64 | HR 70 | Temp 97.1°F | Ht 62.0 in | Wt 224.8 lb

## 2013-05-22 DIAGNOSIS — Z0000 Encounter for general adult medical examination without abnormal findings: Secondary | ICD-10-CM | POA: Insufficient documentation

## 2013-05-22 DIAGNOSIS — F329 Major depressive disorder, single episode, unspecified: Secondary | ICD-10-CM

## 2013-05-22 DIAGNOSIS — F32A Depression, unspecified: Secondary | ICD-10-CM

## 2013-05-22 DIAGNOSIS — K219 Gastro-esophageal reflux disease without esophagitis: Secondary | ICD-10-CM

## 2013-05-22 DIAGNOSIS — M899 Disorder of bone, unspecified: Secondary | ICD-10-CM

## 2013-05-22 DIAGNOSIS — I10 Essential (primary) hypertension: Secondary | ICD-10-CM

## 2013-05-22 DIAGNOSIS — M8580 Other specified disorders of bone density and structure, unspecified site: Secondary | ICD-10-CM

## 2013-05-22 DIAGNOSIS — E7800 Pure hypercholesterolemia, unspecified: Secondary | ICD-10-CM

## 2013-05-22 LAB — CBC WITH DIFFERENTIAL/PLATELET
Basophils Relative: 0.7 % (ref 0.0–3.0)
Eosinophils Relative: 2.1 % (ref 0.0–5.0)
HCT: 39.1 % (ref 36.0–46.0)
Hemoglobin: 13.1 g/dL (ref 12.0–15.0)
Lymphs Abs: 2.4 10*3/uL (ref 0.7–4.0)
MCV: 90.6 fl (ref 78.0–100.0)
Monocytes Absolute: 0.5 10*3/uL (ref 0.1–1.0)
Monocytes Relative: 5.8 % (ref 3.0–12.0)
Platelets: 358 10*3/uL (ref 150.0–400.0)
RBC: 4.32 Mil/uL (ref 3.87–5.11)
WBC: 8.5 10*3/uL (ref 4.5–10.5)

## 2013-05-22 LAB — LIPID PANEL
Cholesterol: 202 mg/dL — ABNORMAL HIGH (ref 0–200)
Total CHOL/HDL Ratio: 5
Triglycerides: 128 mg/dL (ref 0.0–149.0)

## 2013-05-22 LAB — BASIC METABOLIC PANEL
Chloride: 101 mEq/L (ref 96–112)
Creatinine, Ser: 0.8 mg/dL (ref 0.4–1.2)
GFR: 76.43 mL/min (ref >60.00)
Glucose, Bld: 100 mg/dL — ABNORMAL HIGH (ref 70–99)
Potassium: 3.8 mEq/L (ref 3.5–5.1)
Sodium: 139 mEq/L (ref 135–145)

## 2013-05-22 LAB — HEPATIC FUNCTION PANEL
ALT: 25 U/L (ref 0–35)
AST: 20 U/L (ref 0–37)
Alkaline Phosphatase: 44 U/L (ref 39–117)
Total Bilirubin: 0.8 mg/dL (ref 0.3–1.2)
Total Protein: 7.3 g/dL (ref 6.0–8.3)

## 2013-05-22 LAB — LDL CHOLESTEROL, DIRECT: Direct LDL: 140.4 mg/dL

## 2013-05-22 LAB — TSH: TSH: 0.87 u[IU]/mL (ref 0.35–5.50)

## 2013-05-22 NOTE — Assessment & Plan Note (Signed)
-   Dexa 06/15/11  Spine  0.2  L Fem Neck -1.1,   R Fem Neck -1.8  Recheck Vit D pending

## 2013-05-22 NOTE — Progress Notes (Signed)
Subjective:    Patient ID: [name], female    DOB: 07-Jul-1946 .   MRN: [id number]   Brief patient profile:  28 yowf never smoker with morbid obesity complicated by gastroesophageal reflux disease, and also a history of obstructive sleep apnea and chronic rhinitis/ hbp.   HPI  03/16/2013 Acute OV  Complains of fatigue/malaise x1 month - denies changes in breathing, no additional stressors.  Says over the last 4-6, weeks and she is noticed that she does not have any energy> feels very tired throughout the day. Patient wears her CPAP machine every night for at least 6-8 hours without any noted difficulty.  . Patient says that her depression is improved since beginning Wellbutrin.Takes chlortrimeton Twice daily  For allergy symptoms.  rec Labs > ok tsh, cbc, K 3.1 > Rx with KCL but caused diarrhea so stopped Stop Chlortrimeton for now    04/11/2013 f/u ov/[name] re fatigue/hbp/ MO/CR/GERD Chief Complaint  Patient presents with  . Follow-up    fatigue unchanged.     no report of drowsiness and not better off h1  Sees Kaur next week Compliant with cpap rec T get the most out of exercise, you need to be continuously aware that you are short of breath, but never out of breath, for 30 minutes daily. As you improve, it will actually be easier for you to do the same amount of exercise  in  30 minutes so always push to the level where you are short of breath.   Ok to use chlortrimeton 4 mg every 6 hours as needed    05/22/2013 f/u ov/[name] re:  Comprehensive eval for  fatigue/hbp/ MO/CR/GERD with h/o Barrett's  Chief Complaint  Patient presents with  . Annual Exam    Fasting, no co's today.   overall much better outlook with titration up of antidepression meds per [name].  No obvious daytime variabilty or assoc sob or chronic cough or cp or chest tightness, subjective wheeze overt  hb symptoms. No unusual exp hx or h/o childhood pna/ asthma or knowledge of premature birth.   Sleeping ok  without nocturnal  or early am exacerbation  of respiratory  c/o's or need for noct saba. Also denies any obvious fluctuation of symptoms with weather or environmental changes or other aggravating or alleviating factors except as outlined above  Current Medications, Allergies,Complete  Past Medical History, Past Surgical History, Family History, and Social History were reviewed in [employer] record.  ROS  The following are not active complaints unless bolded sore throat, dysphagia, dental problems, itching, sneezing,  nasal congestion using short term afrin to supplement nasal steroids or excess/ purulent secretions, ear ache,   fever, chills, sweats, unintended wt loss, pleuritic or exertional cp, hemoptysis,  orthopnea pnd or leg swelling, presyncope, palpitations, heartburn, abdominal pain, anorexia, nausea, vomiting, diarrhea  or change in bowel or urinary habits, change in stools or urine, dysuria,hematuria,  rash, arthralgias, visual complaints, headache, numbness weakness or ataxia or problems with walking or coordination,  change in mood/affect or memory.                   Past Medical History:  HIATAL HERNIA (ICD-553.3) with GERD  - H/o Barretts esophagus  - EGD pos HH/ stricture 04/18/10 , neg barrett's  RHINITIS, CHRONIC (ICD-472.0)  - Remote allergy testing = molds/dust only  - Sinus CT 09/06/08 mild thickending only  OBSTRUCTIVE SLEEP APNEA (ICD-327.23)..................................................[name]  --PSG 03/31/02 AHI 64  --Nocturnal BIPAP  MORBID OBESITY (ICD-278.01)  - Target wt = 158 for BMI < 30  POSITIVE PPD (ICD-795.5)  DIVERTICULOSIS  HEALTH MAINTENANCE........................................................Marland KitchenWert  - DT May 23, 2009  - Pneumovax 04/2013 - CPX  05/22/2013  - GYN Levoix  Complex med regimen  -------Meds reviewed with pt education and computerized med calendar completed September 10, 2009      Family History:   Father - CAD, ? 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  No etoh         .           Objective:   Physical Exam  wt 224 May 23, 2009 > 226 September 10, 2009 > 229 November 05, 2009 > 232 June 03, 2010 > 224 05/25/2011 >230 01/11/12 > 03/04/2012  225 > 05/17/2012  219 >217 07/29/2012 > 219 09/06/2012 >223 03/16/2013 > 04/11/2013  225 > 05/22/2013  225  Ambulatory obese  wf in no acute distress.  HEENT: nl dentition, and orophanx. Nl external ear canals without cough reflex, elongated uvula, thick tongue.  Neck without JVD/Nodes/TM  Lungs clear to A and P bilaterally without cough on insp or exp maneuvers  RRR no s3 or murmur or increase in P2. Tr  edema bilaterally Abd soft and obese but benign with nl excursion in the supine position. No bruits or organomegaly  Ext warm without calf tenderness, cyanosis clubbing    No deformity noted.  Skin warm and dry without lesions  MS nl gait, no deformities Neuro alert, pleasant, no motor def      CXR  05/22/2013 :   No acute findings.      Assessment & Plan:

## 2013-05-22 NOTE — Patient Instructions (Addendum)
Please remember to go to the lab and x-ray department downstairs for your tests - we will call you with the results when they are available.     Please schedule a follow up visit in 6  months but call sooner if needed   

## 2013-05-22 NOTE — Assessment & Plan Note (Addendum)
-   Target wt 158 for bmi < 30   Lab Results  Component Value Date   TSH 0.87 05/22/2013    Reviewed cal balance issues

## 2013-05-22 NOTE — Assessment & Plan Note (Signed)
Followed in GI clinic/ [facility]/ Russella Dar - H/o Barretts esophagus  - EGD pos HH/ stricture 04/18/10 , neg barrett's

## 2013-05-22 NOTE — Assessment & Plan Note (Signed)
Adequate control on present rx, reviewed > no change in rx needed   

## 2013-05-22 NOTE — Assessment & Plan Note (Signed)
Improved under Dr Carie Caddy care

## 2013-05-22 NOTE — Assessment & Plan Note (Addendum)
-   Ideal LDL < 130 with hbp and ? Pos fm hx  Lab Results  Component Value Date   CHOL 202* 05/22/2013   HDL 41.80 05/22/2013   LDLCALC 122* 05/25/2011   LDLDIRECT 140.4 05/22/2013   TRIG 128.0 05/22/2013   CHOLHDL 5 05/22/2013    Reasonably close to target will easily reach with wt loss

## 2013-05-23 NOTE — Progress Notes (Signed)
Quick Note:  Spoke with the pt and notified of cxr results and she verbalized understanding ______ 

## 2013-05-24 ENCOUNTER — Ambulatory Visit: Payer: PRIVATE HEALTH INSURANCE | Admitting: Gastroenterology

## 2013-05-25 ENCOUNTER — Telehealth: Payer: Self-pay | Admitting: *Deleted

## 2013-05-25 NOTE — Telephone Encounter (Signed)
LMTCB

## 2013-05-25 NOTE — Telephone Encounter (Signed)
Called and spoke with pt about her lab results and pt voiced her understanding.  She stated that she was not able to do the UA that day and has not been able to return to our lab for this due to her work schedule.  She requested that we send an order to [phone] so she can have this done at the lab at her work.  MW please advise if this is ok.  thanks

## 2013-05-25 NOTE — Telephone Encounter (Signed)
Returning call.

## 2013-05-25 NOTE — Telephone Encounter (Signed)
Fine with me - dx hbp

## 2013-05-25 NOTE — Telephone Encounter (Signed)
Message copied by Christen Butter on Thu May 25, 2013  4:25 PM ------      Message from: Sandrea Hughs B      Created: Wed May 24, 2013  8:46 PM       Let her know labs ok - the ldl is a bit high but only 10 over target and easily corrected with diet / ex ------

## 2013-05-31 ENCOUNTER — Other Ambulatory Visit: Payer: Self-pay | Admitting: Gastroenterology

## 2013-05-31 NOTE — Telephone Encounter (Signed)
NEEDS OFFICE VISIT FOR ANY FURTHER REFILLS! 

## 2013-05-31 NOTE — Telephone Encounter (Signed)
Order was faxed to the number provided.

## 2013-06-23 ENCOUNTER — Ambulatory Visit (INDEPENDENT_AMBULATORY_CARE_PROVIDER_SITE_OTHER): Payer: PRIVATE HEALTH INSURANCE | Admitting: Internal Medicine

## 2013-06-23 ENCOUNTER — Encounter: Payer: Self-pay | Admitting: Internal Medicine

## 2013-06-23 VITALS — BP 112/72 | HR 73 | Temp 97.9°F | Ht 62.0 in | Wt 225.0 lb

## 2013-06-23 DIAGNOSIS — I10 Essential (primary) hypertension: Secondary | ICD-10-CM

## 2013-06-23 DIAGNOSIS — R609 Edema, unspecified: Secondary | ICD-10-CM

## 2013-06-23 MED ORDER — VALSARTAN-HYDROCHLOROTHIAZIDE 160-25 MG PO TABS: 1.0000 | ORAL_TABLET | Freq: Every day | ORAL | Status: DC

## 2013-06-23 NOTE — Patient Instructions (Addendum)
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

## 2013-06-23 NOTE — Progress Notes (Signed)
Subjective:    Patient ID: [name], female    DOB: 30-May-1946 .   MRN: [id number]   Brief patient profile:  76 yowf never smoker with morbid obesity complicated by gastroesophageal reflux disease, and also a history of obstructive sleep apnea and chronic rhinitis/ hbp.   HPI  03/16/2013 Acute OV  Complains of fatigue/malaise x1 month - denies changes in breathing, no additional stressors.  Says over the last 4-6, weeks and she is noticed that she does not have any energy> feels very tired throughout the day. Patient wears her CPAP machine every night for at least 6-8 hours without any noted difficulty.  . Patient says that her depression is improved since beginning Wellbutrin.Takes chlortrimeton Twice daily  For allergy symptoms.  rec Labs > ok tsh, cbc, K 3.1 > Rx with KCL but caused diarrhea so stopped Stop Chlortrimeton for now    04/11/2013 f/u ov/Johnhenry Tippin re fatigue/hbp/ MO/CR/GERD Chief Complaint  Patient presents with  . Follow-up    fatigue unchanged.     no report of drowsiness and not better off h1  Sees Kaur next week Compliant with cpap rec Ex/ neg cal bal.   Ok to use chlortrimeton 4 mg every 6 hours as needed    05/22/2013 f/u ov/Jashae Wiggs re:  Comprehensive eval for  fatigue/hbp/ MO/CR/GERD with h/o Barrett's  Chief Complaint  Patient presents with  . Annual Exam    Fasting, no co's today.   overall much better outlook with titration up of antidepression meds per Evelene Croon. rec No change rx   06/23/2013 f/u ov/Jailani Hogans re: hbp f/u Chief Complaint  Patient presents with  . Acute Visit    Pt c/o edema in both feet x 1 wk- left worse than the right.   better when elevates, no calf pain, tendency to dep edema x decades always worse on L  No obvious daytime variabilty or assoc sob or chronic cough or cp or chest tightness, subjective wheeze overt  hb symptoms. No unusual exp hx or h/o childhood pna/ asthma or knowledge of premature birth.   Sleeping ok without nocturnal   or early am exacerbation  of respiratory  c/o's or need for noct saba. Also denies any obvious fluctuation of symptoms with weather or environmental changes or other aggravating or alleviating factors except as outlined above  Current Medications, Allergies,Complete  Past Medical History, Past Surgical History, Family History, and Social History were reviewed in [employer] record.  ROS  The following are not active complaints unless bolded sore throat, dysphagia, dental problems, itching, sneezing,  nasal congestion using short term afrin to supplement nasal steroids or excess/ purulent secretions, ear ache,   fever, chills, sweats, unintended wt loss, pleuritic or exertional cp, hemoptysis,  orthopnea pnd or leg swelling, presyncope, palpitations, heartburn, abdominal pain, anorexia, nausea, vomiting, diarrhea  or change in bowel or urinary habits, change in stools or urine, dysuria,hematuria,  rash, arthralgias, visual complaints, headache, numbness weakness or ataxia or problems with walking or coordination,  change in mood/affect or memory.                   Past Medical History:  HIATAL HERNIA (ICD-553.3) with GERD  - H/o Barretts esophagus  - EGD pos HH/ stricture 04/18/10 , neg barrett's  RHINITIS, CHRONIC (ICD-472.0)  - Remote allergy testing = molds/dust only  - Sinus CT 09/06/08 mild thickending only  OBSTRUCTIVE SLEEP APNEA (ICD-327.23)..................................................[name]  --PSG 03/31/02 AHI 64  --Nocturnal BIPAP  MORBID  OBESITY (ICD-278.01)  - Target wt = 158 for BMI < 30  POSITIVE PPD (ICD-795.5)  DIVERTICULOSIS  HEALTH MAINTENANCE........................................................Marland KitchenWert  - DT May 23, 2009  - Pneumovax 04/2013 - CPX  05/22/2013  - GYN Levoix  Complex med regimen  -------Meds reviewed with pt education and computerized med calendar completed September 10, 2009      Family History:  Father - CAD, ?  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  Never smoker, but Husband smoked / passed 2012 Illicit Drug Use - no  Patient does not get regular exercise.  No etoh         .           Objective:   Physical Exam  wt 224 May 23, 2009 > 226 September 10, 2009 > 229 November 05, 2009 > 232 June 03, 2010 > 224 05/25/2011 >230 01/11/12 > 03/04/2012  225 > 05/17/2012  219 >217 07/29/2012 > 219 09/06/2012 >223 03/16/2013 > 04/11/2013  225 > 05/22/2013  225 > 225 06/23/2013  Ambulatory obese  wf in no acute distress.  HEENT: nl dentition, and orophanx. Nl external ear canals without cough reflex, elongated uvula, thick tongue.  Neck without JVD/Nodes/TM  Lungs clear to A and P bilaterally without cough on insp or exp maneuvers  RRR no s3 or murmur or increase in P2.   Trace pitting edema bilaterally L > R  Abd soft and obese but benign with nl excursion in the supine position. No bruits or organomegaly  Ext warm without calf tenderness, cyanosis clubbing    No deformity noted.  Skin warm and dry without lesions  MS nl gait, no deformities Neuro alert, pleasant, no motor def      CXR  05/22/2013 :   No acute findings.      Assessment & Plan:

## 2013-06-25 NOTE — Assessment & Plan Note (Addendum)
Nl tsh, alb, cxr s chf so rec  change diovan/hct 12.5 to diovan /hct 25 and rec elastic hose/wt loss

## 2013-06-25 NOTE — Assessment & Plan Note (Signed)
Adequate control on present rx, reviewed options for dep edema > rec change diovan/hct 12.5 to diovan /hct 25 and rec elastic hose/wt loss

## 2013-06-30 ENCOUNTER — Other Ambulatory Visit: Payer: Self-pay | Admitting: Gastroenterology

## 2013-06-30 NOTE — Telephone Encounter (Signed)
NEEDS OFFICE VISIT FOR ANY FURTHER REFILLS! 

## 2013-07-17 ENCOUNTER — Other Ambulatory Visit: Payer: Self-pay | Admitting: Internal Medicine

## 2013-08-01 ENCOUNTER — Other Ambulatory Visit: Payer: Self-pay | Admitting: Gastroenterology

## 2013-08-01 ENCOUNTER — Telehealth: Payer: Self-pay | Admitting: Gastroenterology

## 2013-08-01 MED ORDER — PANTOPRAZOLE SODIUM 40 MG PO TBEC: DELAYED_RELEASE_TABLET | ORAL | Status: DC

## 2013-08-01 NOTE — Telephone Encounter (Signed)
Sent one refill until appt

## 2013-08-07 ENCOUNTER — Ambulatory Visit: Payer: PRIVATE HEALTH INSURANCE | Admitting: Gastroenterology

## 2013-08-30 ENCOUNTER — Encounter: Payer: Self-pay | Admitting: Gastroenterology

## 2013-08-30 ENCOUNTER — Ambulatory Visit (INDEPENDENT_AMBULATORY_CARE_PROVIDER_SITE_OTHER): Payer: PRIVATE HEALTH INSURANCE | Admitting: Gastroenterology

## 2013-08-30 VITALS — BP 110/64 | HR 68 | Ht 62.0 in | Wt 222.6 lb

## 2013-08-30 DIAGNOSIS — K219 Gastro-esophageal reflux disease without esophagitis: Secondary | ICD-10-CM

## 2013-08-30 DIAGNOSIS — Z1211 Encounter for screening for malignant neoplasm of colon: Secondary | ICD-10-CM

## 2013-08-30 MED ORDER — PANTOPRAZOLE SODIUM 40 MG PO TBEC: DELAYED_RELEASE_TABLET | ORAL | Status: DC

## 2013-08-30 NOTE — Patient Instructions (Signed)
We have sent the following medications to your pharmacy for you to pick up at your convenience: Protonix.   You will be due for a recall colonoscopy in 11/2013. We will send you a reminder in the mail when it gets closer to that time.  Thank you for choosing me and [facility].  Pricilla Riffle. Dagoberto Ligas., MD., Marval Regal

## 2013-08-30 NOTE — Progress Notes (Signed)
    History of Present Illness: This is a 68 year old female with chronic GERD is well controlled on pantoprazole 40 mg twice daily. She has no GI complaints. Denies weight loss, abdominal pain, constipation, diarrhea, change in stool caliber, melena, hematochezia, nausea, vomiting, dysphagia, reflux symptoms, chest pain.  Current Medications, Allergies, Past Medical History, Past Surgical History, Family History and Social History were reviewed in [employer] record.  Physical Exam: General: Well developed , well nourished, no acute distress Head: Normocephalic and atraumatic Eyes:  sclerae anicteric, EOMI Ears: Normal auditory acuity Mouth: No deformity or lesions Lungs: Clear throughout to auscultation Heart: Regular rate and rhythm; no murmurs, rubs or bruits Abdomen: Soft, non tender and non distended. No masses, hepatosplenomegaly or hernias noted. Normal Bowel sounds Rectal: deferred to colonoscopy Musculoskeletal: Symmetrical with no gross deformities  Pulses:  Normal pulses noted Extremities: No clubbing, cyanosis, edema or deformities noted Neurological: Alert oriented x 4, grossly nonfocal Psychological:  Alert and cooperative. Normal mood and affect  Assessment and Recommendations:  1. GERD. History of an esophageal stricture. Prior esophageal stricture biopsies showed Barrett's however there was no visible Barrett's. No longer needs Barrett's surveillance. Continue standard antireflux measures and pantoprazole 40 mg twice daily. May use famotidine 20 mg in the evening as needed for breakthrough symptoms.   2. Obesity. Long-term weight loss program supervised by her primary physician is recommended.   3. Colorectal cancer screening. Screening colonoscopy due April 2015

## 2013-09-14 ENCOUNTER — Telehealth: Payer: Self-pay | Admitting: Internal Medicine

## 2013-09-14 DIAGNOSIS — Z0000 Encounter for general adult medical examination without abnormal findings: Secondary | ICD-10-CM

## 2013-09-14 NOTE — Telephone Encounter (Signed)
Please advise if ok to send order to [facility] for routine mammogram.

## 2013-09-14 NOTE — Telephone Encounter (Signed)
ok 

## 2013-09-14 NOTE — Telephone Encounter (Signed)
lmomtcb x1 for pt Order placed  

## 2013-09-14 NOTE — Telephone Encounter (Signed)
Pt is aware.  

## 2013-09-23 ENCOUNTER — Encounter: Payer: Self-pay | Admitting: Gastroenterology

## 2013-11-23 ENCOUNTER — Ambulatory Visit: Payer: PRIVATE HEALTH INSURANCE | Admitting: Psychology

## 2013-12-13 ENCOUNTER — Ambulatory Visit: Payer: PRIVATE HEALTH INSURANCE | Admitting: Internal Medicine

## 2013-12-19 ENCOUNTER — Telehealth: Payer: Self-pay | Admitting: Internal Medicine

## 2013-12-19 NOTE — Telephone Encounter (Signed)
LMTCB-ask for Katie.  

## 2013-12-19 NOTE — Telephone Encounter (Signed)
Called and spoke with pt and she is aware of [facility] recs of the medication.  Pt stated that she has appt with Dr. Fuller Plan on 5/13.  Pt stated that she will keep this appt but i advised her that if the pain starts back or gets worse to call and see if they may be able to work her in sooner.  Pt voiced her understanding and nothing further is needed.

## 2013-12-19 NOTE — Telephone Encounter (Signed)
Pt states she had an attack on Sunday of sharp lower abdominal pain-never had before. Went away for little while but has started coming back after eating; about 25 minutes after. Pt states she is not having fever or chills. Gallbladder removed in 1985 and had appendix. Pt would like to know recs from MW.    Please advise.  Allergies  Allergen Reactions  . Epinephrine     REACTION: tachycardia

## 2013-12-19 NOTE — Telephone Encounter (Signed)
Pt returning call.[name] ° °

## 2013-12-19 NOTE — Telephone Encounter (Signed)
citrucel powder form 1 heaping tsp bid and set up for GI appt asap  In meantime avoid foods she knows cause gas

## 2013-12-19 NOTE — Telephone Encounter (Signed)
Left message for patient to call us back.  

## 2013-12-19 NOTE — Telephone Encounter (Signed)
Pt returned call.  [name] ° °

## 2013-12-19 NOTE — Telephone Encounter (Signed)
LMTC x 1  

## 2013-12-20 ENCOUNTER — Telehealth: Payer: Self-pay | Admitting: Gastroenterology

## 2013-12-20 ENCOUNTER — Ambulatory Visit (INDEPENDENT_AMBULATORY_CARE_PROVIDER_SITE_OTHER): Payer: PRIVATE HEALTH INSURANCE | Admitting: Gastroenterology

## 2013-12-20 ENCOUNTER — Other Ambulatory Visit (INDEPENDENT_AMBULATORY_CARE_PROVIDER_SITE_OTHER): Payer: PRIVATE HEALTH INSURANCE

## 2013-12-20 ENCOUNTER — Other Ambulatory Visit: Payer: Self-pay

## 2013-12-20 ENCOUNTER — Encounter: Payer: Self-pay | Admitting: Gastroenterology

## 2013-12-20 VITALS — BP 124/68 | HR 82 | Ht 62.0 in | Wt 218.0 lb

## 2013-12-20 DIAGNOSIS — R109 Unspecified abdominal pain: Secondary | ICD-10-CM | POA: Diagnosis not present

## 2013-12-20 DIAGNOSIS — Z1211 Encounter for screening for malignant neoplasm of colon: Secondary | ICD-10-CM

## 2013-12-20 LAB — CBC
HEMATOCRIT: 37.1 % (ref 36.0–46.0)
HEMOGLOBIN: 12.3 g/dL (ref 12.0–15.0)
MCHC: 33.1 g/dL (ref 30.0–36.0)
MCV: 90.5 fl (ref 78.0–100.0)
PLATELETS: 374 10*3/uL (ref 150.0–400.0)
RBC: 4.1 Mil/uL (ref 3.87–5.11)
RDW: 13.2 % (ref 11.5–14.6)
WBC: 14.4 10*3/uL — AB (ref 4.5–10.5)

## 2013-12-20 LAB — BASIC METABOLIC PANEL
BUN: 12 mg/dL (ref 6–23)
CHLORIDE: 97 meq/L (ref 96–112)
CO2: 33 mEq/L — ABNORMAL HIGH (ref 19–32)
Calcium: 9.3 mg/dL (ref 8.4–10.5)
Creatinine, Ser: 0.9 mg/dL (ref 0.4–1.2)
GFR: 70.18 mL/min (ref >60.00)
GLUCOSE: 100 mg/dL — AB (ref 70–99)
POTASSIUM: 3.2 meq/L — AB (ref 3.5–5.1)
SODIUM: 137 meq/L (ref 135–145)

## 2013-12-20 NOTE — Telephone Encounter (Signed)
Patient with abdominal pain and diarrhea for 1 week.  It is not improving an this is a new problem for her she will come in and see Alonza Bogus, PA today at 2:00

## 2013-12-20 NOTE — Progress Notes (Signed)
12/20/2013 [name] [id number] 1945-09-07   History of Present Illness:  This is a pleasant 68 year old female who is known to Dr. Fuller Plan for treatment of her acid reflux. She presents to our office today with complaints of sudden onset of mid/periumbilical abdominal pain that began Sunday night. She describes it as sharp shooting pain, and it has been constant since its onset. It does get worse with eating.  She states that she has never had any similar pain in the past.  Reaches a 6/10 on the pain scale at times. She denies any associated nausea, vomiting, fever, or bowel issues. She takes Tylenol for other aches and pains and states that it does help the abdominal discomfort to some degree. She has history of open cholecystectomy and an umbilical hernia repair.  While she was here was noted the her last colonoscopy was in April 2005 at which time her study was normal except for diverticulosis.   Current Medications, Allergies, Past Medical History, Past Surgical History, Family History and Social History were reviewed in [employer] record.   Physical Exam: BP 124/68  Pulse 82  Ht 5\' 2"  (1.575 m)  Wt 218 lb (98.884 kg)  BMI 39.86 kg/m2 General: Well developed white female in no acute distress Head: Normocephalic and atraumatic Eyes:  Sclerae anicteric, conjunctiva pink  Ears: Normal auditory acuity Lungs: Clear throughout to auscultation Heart: Regular rate and rhythm Abdomen: Soft, non-distended.  Normal bowel sounds.  Open cholecystectomy scar noted.  TTP in the mid-abdomen around the umbilicus.   Rectal:  Deferred.  Will be done at the time of colonoscopy. Musculoskeletal: Symmetrical with no gross deformities  Extremities: No edema.  Multiple tattoos noted.  Neurological: Alert oriented x 4, grossly non-focal Psychological:  Alert and cooperative. Normal mood and affect  Assessment and Recommendations: -Central/periumbilical abdominal pain:  Sudden  onset 3 days ago.  No similar pain in the past.  ? Source.  Will check CT scan of the abdomen and pelvis.  Will check CBC and BMP as well. -Screening colonoscopy:  Will schedule with Dr. Fuller Plan.  The risks, benefits, and alternatives were discussed with the patient and she consents to proceed.

## 2013-12-20 NOTE — Progress Notes (Signed)
Reviewed and agree with management plan.  Jyron Turman T. Kendrew Paci, MD FACG 

## 2013-12-20 NOTE — Patient Instructions (Addendum)
It has been recommended to you by your physician that you have a(n) Colonoscopy completed. Per your request, we did not schedule the procedure(s) today. Please contact our office at [phone] should you decide to have the procedure completed.  Your physician has requested that you go to the basement for the following lab work before leaving today: [place] have been scheduled for a CT scan of the abdomen and pelvis at [place] are scheduled on 12-22-2013 at 830 am. You should arrive 15 minutes prior to your appointment time for registration.  You have to pick up your CT Contrast to drink before CT today at [facility].

## 2013-12-29 ENCOUNTER — Encounter: Payer: PRIVATE HEALTH INSURANCE | Admitting: Gastroenterology

## 2013-12-29 ENCOUNTER — Encounter: Payer: Self-pay | Admitting: Gastroenterology

## 2014-01-02 ENCOUNTER — Telehealth: Payer: Self-pay | Admitting: *Deleted

## 2014-01-02 NOTE — Telephone Encounter (Signed)
Message copied by Hulan Saas on Tue Jan 02, 2014  2:04 PM ------      Message from: Alonza Bogus D      Created: Tue Jan 02, 2014  1:43 PM      Regarding: CT scan result       Please let patient know that this is the first time that we have seen the CT scan results.  It does show diverticulitis, which she apparently already knows.  Is she on treatment?  If not, then we need to start cipro 500 mg BID and flagyl 500 mg TID x 10 days.  Does she still need/want her appt for tomorrow?            Thank you,            Jess  ------

## 2014-01-02 NOTE — Telephone Encounter (Signed)
Spoke with patient and she went to [facility] ED on 12/21/13 and was given antibiotics which she will complete today. She is better. She states they put her on a low fiber diet. She called and made an OV tomorrow because she wants to know when she can start eating high fiber again. Spoke with Alonza Bogus, PA, patient needs to stay on low fiber another week. Then if she has no pain, may go back to high fiber diet. Patient does not need OV tomorrow. Patient has scheduled a colonoscopy on 03/02/14. Per Alonza Bogus, PA this date is fine. Patient aware of recommendations. Appointment cancelled.

## 2014-01-03 ENCOUNTER — Ambulatory Visit: Payer: PRIVATE HEALTH INSURANCE | Admitting: Gastroenterology

## 2014-02-01 ENCOUNTER — Telehealth: Payer: Self-pay | Admitting: Gastroenterology

## 2014-02-01 MED ORDER — CIPROFLOXACIN HCL 500 MG PO TABS: 500.0000 mg | ORAL_TABLET | Freq: Two times a day (BID) | ORAL | Status: DC

## 2014-02-01 MED ORDER — METRONIDAZOLE 500 MG PO TABS: 500.0000 mg | ORAL_TABLET | Freq: Three times a day (TID) | ORAL | Status: DC

## 2014-02-01 NOTE — Telephone Encounter (Signed)
Patient notified of recommendations.  She verbalized understanding to start cipro and flagyl.  Begin a low residue diet until pain resolves.  She is not able to schedule colon at this time for August due to work conflicts and our schedule for Sep[tember is not out yet.  She will call back in the next two weeks to reschedule

## 2014-02-01 NOTE — Telephone Encounter (Signed)
Patient reports she has a history of diverticulitis and was treated with antibiotics that she completed in the beginning of May.  She reports that the pain returned last night and is consistent with the same pain she had when diagnosed with diverticulitis.  She reports periumbilical pain, nausea, and diarrhea.  She denies vomiting, fever, and constipation.  She is currently scheduled for a screening colonoscopy on 03/02/14.  Alonza Bogus, PA please advise do you want to see her or give alternate orders?

## 2014-02-01 NOTE — Telephone Encounter (Signed)
Since the pain feels the same to her as previously then let's just treat her empirically for diverticulitis again with flagyl 500 mg TID and cipro 500 mg BID for 14 days.  Let's reschedule her colonoscopy to the first or second week in August instead.  She needs to let us know if she has any other problems in the interim as she did this time (hopefully she won't).  Thank you,  Jess

## 2014-02-16 ENCOUNTER — Encounter: Payer: Self-pay | Admitting: Gastroenterology

## 2014-02-27 ENCOUNTER — Encounter: Payer: Self-pay | Admitting: Internal Medicine

## 2014-02-27 ENCOUNTER — Ambulatory Visit (INDEPENDENT_AMBULATORY_CARE_PROVIDER_SITE_OTHER): Payer: PRIVATE HEALTH INSURANCE | Admitting: Internal Medicine

## 2014-02-27 VITALS — BP 130/62 | HR 71 | Temp 98.1°F | Ht 62.0 in | Wt 215.0 lb

## 2014-02-27 DIAGNOSIS — I10 Essential (primary) hypertension: Secondary | ICD-10-CM

## 2014-02-27 DIAGNOSIS — G4733 Obstructive sleep apnea (adult) (pediatric): Secondary | ICD-10-CM | POA: Diagnosis not present

## 2014-02-27 DIAGNOSIS — R609 Edema, unspecified: Secondary | ICD-10-CM | POA: Diagnosis not present

## 2014-02-27 NOTE — Progress Notes (Signed)
Subjective:    Patient ID: [name], female    DOB: February 03, 1946 .   MRN: [id number]   Brief patient profile:  50 yowf never smoker with morbid obesity complicated by gastroesophageal reflux disease, and also a history of obstructive sleep apnea and chronic rhinitis/ hbp.     History of Present Illness  03/16/2013 Acute OV  Complains of fatigue/malaise x1 month - denies changes in breathing, no additional stressors.  Says over the last 4-6, weeks and she is noticed that she does not have any energy> feels very tired throughout the day. Patient wears her CPAP machine every night for at least 6-8 hours without any noted difficulty.  . Patient says that her depression is improved since beginning Wellbutrin.Takes chlortrimeton Twice daily  For allergy symptoms.  rec Labs > ok tsh, cbc, K 3.1 > Rx with KCL but caused diarrhea so stopped Stop Chlortrimeton for now    04/11/2013 f/u ov/[name] re fatigue/hbp/ MO/CR/GERD Chief Complaint  Patient presents with  . Follow-up    fatigue unchanged.     no report of drowsiness and not better off h1  Sees Kaur next week Compliant with cpap rec Ex/ neg cal bal.   Ok to use chlortrimeton 4 mg every 6 hours as needed    05/22/2013 f/u ov/[name] re:  Comprehensive eval for  fatigue/hbp/ MO/CR/GERD with h/o Barrett's  Chief Complaint  Patient presents with  . Annual Exam    Fasting, no co's today.   overall much better outlook with titration up of antidepression meds per [name]. rec No change rx   06/23/2013 f/u ov/[name] re: hbp f/u Chief Complaint  Patient presents with  . Acute Visit    Pt c/o edema in both feet x 1 wk- left worse than the right.   better when elevates, no calf pain, tendency to dep edema x decades always worse on L rec Change diovan to 160/25 one daily    02/27/2014 f/u ov/[name] re: hbp/ osa/ohs/peripheral edema Chief Complaint  Patient presents with  . Acute Visit    Pt c/o fatigue for the past 2 to 3 months. She states  "feel tired all of the time".  She is averaging 6-8 hours of sleep every night and does not feel tired when she wakes up in the am.    on bipap per [facility] with last HCO3 33 and no daytime drowsiness, just bad fatigue p work. Felt much better at the beach recently with good ex tol     No obvious daytime variabilty or assoc sob or chronic cough or cp or chest tightness, subjective wheeze overt  hb symptoms. No unusual exp hx or h/o childhood pna/ asthma or knowledge of premature birth.   Sleeping ok on bipap without nocturnal  or early am exacerbation  of respiratory  c/o's or need for noct saba. Also denies any obvious fluctuation of symptoms with weather or environmental changes or other aggravating or alleviating factors except as outlined above  Current Medications, Allergies,Complete  Past Medical History, Past Surgical History, Family History, and Social History were reviewed in [employer] record.  ROS  The following are not active complaints unless bolded sore throat, dysphagia, dental problems, itching, sneezing,   excess/ purulent secretions, ear ache,   fever, chills, sweats, unintended wt loss, pleuritic or exertional cp, hemoptysis,  orthopnea pnd or leg swelling resolved, presyncope, palpitations, heartburn, abdominal pain, anorexia, nausea, vomiting, diarrhea  or change in bowel or urinary habits, change in stools  or urine, dysuria,hematuria,  rash, arthralgias, visual complaints, headache, numbness weakness or ataxia or problems with walking or coordination,  change in mood/affect or memory.                   Past Medical History:  HIATAL HERNIA (ICD-553.3) with GERD  - H/o Barretts esophagus  - EGD pos HH/ stricture 04/18/10 , neg barrett's  RHINITIS, CHRONIC (ICD-472.0)  - Remote allergy testing = molds/dust only  - Sinus CT 09/06/08 mild thickending only  OBSTRUCTIVE SLEEP APNEA (ICD-327.23)..................................................[name]   --PSG 03/31/02 AHI 64  --Nocturnal BIPAP  MORBID OBESITY (ICD-278.01)  - Target wt = 158 for BMI < 30  POSITIVE PPD (ICD-795.5)  DIVERTICULOSIS  HEALTH MAINTENANCE........................................................Marland KitchenWert  - DT May 23, 2009  - Pneumovax 04/2013 - CPX  05/22/2013  - GYN Levoix  Complex med regimen  -------Meds reviewed with pt education and computerized med calendar completed September 10, 2009      Family History:  Father - CAD, ? 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  Never smoker, but Husband smoked / passed 9211 Illicit Drug Use - no  Patient does not get regular exercise.  No etoh         .           Objective:   Physical Exam  wt 224 May 23, 2009 > 226 September 10, 2009 > 229 November 05, 2009 > 232 June 03, 2010 > 224 05/25/2011 >230 01/11/12 > 03/04/2012  225 > 05/17/2012  219 >217 07/29/2012 > 219 09/06/2012 >223 03/16/2013 > 04/11/2013  225 > 05/22/2013  225 > 225 06/23/2013 >  02/27/2014 215  Ambulatory obese  wf in no acute distress.  HEENT: nl dentition, and orophanx. Nl external ear canals without cough reflex, elongated uvula, thick tongue.  Neck without JVD/Nodes/TM  Lungs clear to A and P bilaterally without cough on insp or exp maneuvers  RRR no s3 or murmur or increase in P2.   No sign edema Abd soft and obese but benign with nl excursion in the supine position. No bruits or organomegaly  Ext warm without calf tenderness, cyanosis clubbing    No deformity noted.  Skin warm and dry without lesions  MS nl gait, no deformities Neuro alert, pleasant, no motor def      CXR  05/22/2013 :   No acute findings.      Assessment &  Plan:

## 2014-02-27 NOTE — Assessment & Plan Note (Signed)
-   on Bipap chronically - HC03 up to 33 on bmet 12/20/13 > referred back to Dr [name]

## 2014-02-27 NOTE — Assessment & Plan Note (Signed)
Adequate control on present rx, reviewed > no change in rx needed   

## 2014-02-27 NOTE — Patient Instructions (Signed)
See Dr [name] next available  for your bipap which may need adjustment  CPX due 05/24/14

## 2014-02-27 NOTE — Assessment & Plan Note (Signed)
Resolved on low doses of hctz plus ARB > no change rx

## 2014-03-02 ENCOUNTER — Encounter: Payer: PRIVATE HEALTH INSURANCE | Admitting: Gastroenterology

## 2014-03-09 ENCOUNTER — Encounter: Payer: PRIVATE HEALTH INSURANCE | Admitting: Gastroenterology

## 2014-04-16 DIAGNOSIS — M6688 Spontaneous rupture of other tendons, other: Secondary | ICD-10-CM | POA: Diagnosis not present

## 2014-04-16 DIAGNOSIS — M19019 Primary osteoarthritis, unspecified shoulder: Secondary | ICD-10-CM | POA: Diagnosis not present

## 2014-04-18 ENCOUNTER — Encounter: Payer: Self-pay | Admitting: Pulmonary Disease

## 2014-04-18 ENCOUNTER — Ambulatory Visit (INDEPENDENT_AMBULATORY_CARE_PROVIDER_SITE_OTHER): Payer: PRIVATE HEALTH INSURANCE | Admitting: Pulmonary Disease

## 2014-04-18 VITALS — BP 100/62 | HR 67 | Ht 62.0 in | Wt 217.4 lb

## 2014-04-18 DIAGNOSIS — G4733 Obstructive sleep apnea (adult) (pediatric): Secondary | ICD-10-CM | POA: Diagnosis not present

## 2014-04-18 NOTE — Patient Instructions (Signed)
Will get copy of BiPAP report Will arrange for overnight oxygen test Follow up in 3 months

## 2014-04-18 NOTE — Progress Notes (Deleted)
   Subjective:    Patient ID: DULSE RUTAN, female    DOB: Jan 11, 1946, 68 y.o.   MRN: [id number]  HPI    Review of Systems  Constitutional: Negative for fever and unexpected weight change.  HENT: Positive for congestion and sneezing. Negative for dental problem, ear pain, nosebleeds, postnasal drip, rhinorrhea, sinus pressure, sore throat and trouble swallowing.   Eyes: Negative for redness and itching.  Respiratory: Negative for cough, chest tightness, shortness of breath and wheezing.   Cardiovascular: Negative for palpitations and leg swelling.  Gastrointestinal: Negative for nausea and vomiting.  Genitourinary: Negative for dysuria.  Musculoskeletal: Negative for joint swelling.  Skin: Negative for rash.  Neurological: Negative for headaches.  Hematological: Does not bruise/bleed easily.  Psychiatric/Behavioral: Positive for dysphoric mood. The patient is nervous/anxious.        Objective:   Physical Exam        Assessment & Plan:

## 2014-04-18 NOTE — Progress Notes (Signed)
Chief Complaint  Patient presents with  . SLEEP CONSULT    Referred by Dr Melvyn Novas. Former pt 2011. Using BiPAP nightly. Pt c/o incr fatigue. Epworth Score: 14    History of Present Illness: [name] is a 68 y.o. female for evaluation of sleep problems.  I last saw [name] in 2012.  She has hx of severe sleep apnea, and has been using BiPAP 14/9 cm H2O.  She is followed by Dr. Melvyn Novas, and recent lab work showed elevation in HCO3.  This raised concern about whether she need to have her sleep apnea therapy re-assessed.  She was therefore referred back to me.  She has noticed feeling more fatigued over the past several months.  She goes to sleep at 10 pm.  She falls asleep almost immediately.  She wakes up one time to use the bathroom.  She gets out of bed at 530 am.  She feels tired in the morning.  She denies morning headache.  She does not use anything to help her fall sleep or stay awake.  She denies sleep walking, sleep talking, bruxism, or nightmares.  There is no history of restless legs.  She denies sleep hallucinations, sleep paralysis, or cataplexy.  The Epworth score is 14 out of 24.  Tests: PSG 03/31/02 >> AHI 64  BiPAP titration 12/01/07 >> 14/9 cm H2O >> centrals with higher pressures.    [name]  has a past medical history of Hiatal hernia; GERD (gastroesophageal reflux disease); Allergic rhinitis; OSA (obstructive sleep apnea); Morbid obesity; Positive PPD; Diverticulosis; Esophageal stricture; Gastric polyps; Barrett's esophagus (05/2005); Anxiety and depression; and Arthritis.  [name]  has past surgical history that includes Cesarean section; Cholecystectomy; Foot fracture surgery; Foot neuroma surgery; Tubal ligation (1981); Wrist fracture surgery; Finger surgery; and Tonsillectomy.  Prior to Admission medications   Medication Sig Start Date End Date Taking? Authorizing Provider  acetaminophen (TYLENOL) 325 MG tablet Take 650 mg by mouth every 6 (six)  hours as needed. For pain   Yes Historical Provider, MD  ALPRAZolam (XANAX) 0.25 MG tablet Take 0.125 mg by mouth 3 (three) times daily as needed. For anxiety   Yes Historical Provider, MD  Ascorbic Acid (VITAMIN C) 500 MG tablet Take 500 mg by mouth daily.     Yes Historical Provider, MD  b complex vitamins tablet Take 1 tablet by mouth daily.    Yes Historical Provider, MD  buPROPion (WELLBUTRIN XL) 300 MG 24 hr tablet Take 300 mg by mouth daily.   Yes Historical Provider, MD  Calcium Carbonate-Vitamin D 600-400 MG-UNIT per tablet Take 1 tablet by mouth daily.     Yes Historical Provider, MD  DULoxetine (CYMBALTA) 60 MG capsule Take 60 mg by mouth daily.   Yes Historical Provider, MD  famotidine (PEPCID) 20 MG tablet Take 20 mg by mouth at bedtime as needed. For allergies   Yes Historical Provider, MD  fluticasone (FLONASE) 50 MCG/ACT nasal spray PLACE 2 SPRAYS INTO THE NOSE DAILY 07/17/13  Yes Tanda Rockers, MD  pantoprazole (PROTONIX) 40 MG tablet TAKE 1 TABLET BY MOUTH TWICE DAILY. TAKE1 EVERY 30 TO 60 MINUTES BEFORE FIRST AND LAST MEALS OF THE DAY 08/30/13  Yes Ladene Artist, MD  psyllium (REGULOID) 0.52 G capsule Take 2 capsules two times a day as needed    Yes Historical Provider, MD  valsartan-hydrochlorothiazide (DIOVAN HCT) 160-25 MG per tablet Take 1 tablet by mouth daily. 06/23/13  Yes Tanda Rockers, MD  Allergies  Allergen Reactions  . Epinephrine     REACTION: tachycardia    Her family history includes Allergies in her mother; Alzheimer's disease in her mother; Colon cancer in her maternal uncle; Coronary artery disease in her father; Diabetes in her son; Heart attack in her paternal grandmother; Heart failure in her father and mother; Hypertension in her brother; Lung cancer in her paternal grandfather; Sleep apnea in her brother and son.  She  reports that she has never smoked. She has never used smokeless tobacco. She reports that she does not drink alcohol or use illicit  drugs.  Review of Systems  Constitutional: Negative for fever and unexpected weight change.  HENT: Positive for congestion and sneezing. Negative for dental problem, ear pain, nosebleeds, postnasal drip, rhinorrhea, sinus pressure, sore throat and trouble swallowing.   Eyes: Negative for redness and itching.  Respiratory: Negative for cough, chest tightness, shortness of breath and wheezing.   Cardiovascular: Negative for palpitations and leg swelling.  Gastrointestinal: Negative for nausea and vomiting.  Genitourinary: Negative for dysuria.  Musculoskeletal: Negative for joint swelling.  Skin: Negative for rash.  Neurological: Negative for headaches.  Hematological: Does not bruise/bleed easily.  Psychiatric/Behavioral: Positive for dysphoric mood. The patient is nervous/anxious.    Physical Exam:  General - No distress ENT - No sinus tenderness, no oral exudate, no LAN, no thyromegaly, TM clear, pupils equal/reactive, MP 3 Cardiac - s1s2 regular, no murmur, pulses symmetric Chest - No wheeze/rales/dullness, good air entry, normal respiratory excursion Back - No focal tenderness Abd - Soft, non-tender, no organomegaly, + bowel sounds Ext - No edema Neuro - Normal strength, cranial nerves intact Skin - No rashes Psych - Normal mood, and behavior   Lab Results  Component Value Date   CREATININE 0.9 12/20/2013   BUN 12 12/20/2013   NA 137 12/20/2013   K 3.2* 12/20/2013   CL 97 12/20/2013   CO2 33* 12/20/2013    Lab Results  Component Value Date   WBC 14.4* 12/20/2013   HGB 12.3 12/20/2013   HCT 37.1 12/20/2013   MCV 90.5 12/20/2013   PLT 374.0 12/20/2013    Assessment/plan:  Chesley Mires, M.D. Pager [phone]

## 2014-04-23 ENCOUNTER — Ambulatory Visit: Payer: PRIVATE HEALTH INSURANCE | Admitting: Psychology

## 2014-04-24 NOTE — Assessment & Plan Note (Signed)
She has history of severe sleep apnea.  She has been on BiPAP therapy, but not changes to her set up since 2009.  She has noticed more symptoms of daytime sleepiness and fatigue.  She was also noted to have elevated HCO3 on recent lab work.  To further assess will get copy of her BiPAP download.  Will also arrange for overnight oximetry with her using BiPAP and room air.  Depending on results will determine if her set up can be adjusted first at home, or if she needs to have repeat in lab titration study.  Also discussed importance of weight loss as therapy for sleep apnea.  Driving precautions were reviewed.

## 2014-04-27 ENCOUNTER — Encounter: Payer: Self-pay | Admitting: Pulmonary Disease

## 2014-04-27 ENCOUNTER — Encounter: Payer: Self-pay | Admitting: Internal Medicine

## 2014-04-27 ENCOUNTER — Ambulatory Visit (INDEPENDENT_AMBULATORY_CARE_PROVIDER_SITE_OTHER)
Admission: RE | Admit: 2014-04-27 | Discharge: 2014-04-27 | Disposition: A | Discharge status: Home / Self Care | Payer: PRIVATE HEALTH INSURANCE | Source: Ambulatory Visit | Attending: Internal Medicine | Admitting: Internal Medicine

## 2014-04-27 ENCOUNTER — Other Ambulatory Visit (INDEPENDENT_AMBULATORY_CARE_PROVIDER_SITE_OTHER): Payer: PRIVATE HEALTH INSURANCE

## 2014-04-27 ENCOUNTER — Ambulatory Visit (INDEPENDENT_AMBULATORY_CARE_PROVIDER_SITE_OTHER): Payer: PRIVATE HEALTH INSURANCE | Admitting: Internal Medicine

## 2014-04-27 ENCOUNTER — Encounter: Payer: PRIVATE HEALTH INSURANCE | Admitting: Gastroenterology

## 2014-04-27 VITALS — BP 104/60 | HR 66 | Temp 98.0°F | Ht 61.0 in | Wt 215.4 lb

## 2014-04-27 DIAGNOSIS — Z0000 Encounter for general adult medical examination without abnormal findings: Secondary | ICD-10-CM

## 2014-04-27 DIAGNOSIS — I10 Essential (primary) hypertension: Secondary | ICD-10-CM | POA: Diagnosis not present

## 2014-04-27 DIAGNOSIS — E7800 Pure hypercholesterolemia, unspecified: Secondary | ICD-10-CM

## 2014-04-27 DIAGNOSIS — Z23 Encounter for immunization: Secondary | ICD-10-CM

## 2014-04-27 DIAGNOSIS — J310 Chronic rhinitis: Secondary | ICD-10-CM

## 2014-04-27 DIAGNOSIS — M949 Disorder of cartilage, unspecified: Secondary | ICD-10-CM

## 2014-04-27 DIAGNOSIS — G4733 Obstructive sleep apnea (adult) (pediatric): Secondary | ICD-10-CM

## 2014-04-27 DIAGNOSIS — M8580 Other specified disorders of bone density and structure, unspecified site: Secondary | ICD-10-CM

## 2014-04-27 DIAGNOSIS — M899 Disorder of bone, unspecified: Secondary | ICD-10-CM

## 2014-04-27 LAB — BASIC METABOLIC PANEL
BUN: 14 mg/dL (ref 6–23)
CALCIUM: 9.3 mg/dL (ref 8.4–10.5)
CO2: 31 mEq/L (ref 19–32)
Chloride: 98 mEq/L (ref 96–112)
Creatinine, Ser: 0.7 mg/dL (ref 0.4–1.2)
GFR: 84.7 mL/min (ref >60.00)
Glucose, Bld: 97 mg/dL (ref 70–99)
POTASSIUM: 3.6 meq/L (ref 3.5–5.1)
SODIUM: 137 meq/L (ref 135–145)

## 2014-04-27 LAB — LIPID PANEL
Cholesterol: 187 mg/dL (ref 0–200)
HDL: 35.3 mg/dL — ABNORMAL LOW (ref >39.00)
LDL CALC: 119 mg/dL — AB (ref 0–99)
NonHDL: 151.7
TRIGLYCERIDES: 164 mg/dL — AB (ref 0.0–149.0)
Total CHOL/HDL Ratio: 5
VLDL: 32.8 mg/dL (ref 0.0–40.0)

## 2014-04-27 LAB — CBC WITH DIFFERENTIAL/PLATELET
BASOS ABS: 0.1 10*3/uL (ref 0.0–0.1)
BASOS PCT: 0.8 % (ref 0.0–3.0)
EOS PCT: 1.3 % (ref 0.0–5.0)
Eosinophils Absolute: 0.1 10*3/uL (ref 0.0–0.7)
HCT: 39.2 % (ref 36.0–46.0)
HEMOGLOBIN: 13.1 g/dL (ref 12.0–15.0)
LYMPHS PCT: 29.3 % (ref 12.0–46.0)
Lymphs Abs: 2.7 10*3/uL (ref 0.7–4.0)
MCHC: 33.4 g/dL (ref 30.0–36.0)
MCV: 90.8 fl (ref 78.0–100.0)
Monocytes Absolute: 0.6 10*3/uL (ref 0.1–1.0)
Monocytes Relative: 6.9 % (ref 3.0–12.0)
NEUTROS ABS: 5.8 10*3/uL (ref 1.4–7.7)
Neutrophils Relative %: 61.7 % (ref 43.0–77.0)
Platelets: 391 10*3/uL (ref 150.0–400.0)
RBC: 4.31 Mil/uL (ref 3.87–5.11)
RDW: 14.2 % (ref 11.5–15.5)
WBC: 9.4 10*3/uL (ref 4.0–10.5)

## 2014-04-27 LAB — HEPATIC FUNCTION PANEL
ALT: 28 U/L (ref 0–35)
AST: 21 U/L (ref 0–37)
Albumin: 4.1 g/dL (ref 3.5–5.2)
Alkaline Phosphatase: 53 U/L (ref 39–117)
Bilirubin, Direct: 0.1 mg/dL (ref 0.0–0.3)
TOTAL PROTEIN: 7.4 g/dL (ref 6.0–8.3)
Total Bilirubin: 0.6 mg/dL (ref 0.2–1.2)

## 2014-04-27 LAB — URINALYSIS
Bilirubin Urine: NEGATIVE
KETONES UR: NEGATIVE
Leukocytes, UA: NEGATIVE
Nitrite: NEGATIVE
Total Protein, Urine: NEGATIVE
URINE GLUCOSE: NEGATIVE
UROBILINOGEN UA: 0.2 (ref 0.0–1.0)
pH: 5.5 (ref 5.0–8.0)

## 2014-04-27 LAB — TSH: TSH: 1.23 u[IU]/mL (ref 0.35–4.50)

## 2014-04-27 NOTE — Progress Notes (Signed)
Subjective:    Patient ID: [name], female    DOB: Jul 26, 1946 .   MRN: [id number]   Brief patient profile:  3 yowf never smoker with morbid obesity complicated by gastroesophageal reflux disease, and also a history of obstructive sleep apnea and chronic rhinitis/ hbp.   History of Present Illness  03/16/2013 Acute OV  Complains of fatigue/malaise x1 month - denies changes in breathing, no additional stressors.  Says over the last 4-6, weeks and she is noticed that she does not have any energy> feels very tired throughout the day. Patient wears her CPAP machine every night for at least 6-8 hours without any noted difficulty.  . Patient says that her depression is improved since beginning Wellbutrin.Takes chlortrimeton Twice daily  For allergy symptoms.  rec Labs > ok tsh, cbc, K 3.1 > Rx with KCL but caused diarrhea so stopped Stop Chlortrimeton for now    06/23/2013 f/u ov/Birney Belshe re: hbp f/u Chief Complaint  Patient presents with  . Acute Visit    Pt c/o edema in both feet x 1 wk- left worse than the right.   better when elevates, no calf pain, tendency to dep edema x decades always worse on L rec Change diovan to 160/25 one daily    02/27/2014 f/u ov/Taryn Shellhammer re: hbp/ osa/ohs/peripheral edema Chief Complaint  Patient presents with  . Acute Visit    Pt c/o fatigue for the past 2 to 3 months. She states "feel tired all of the time".  She is averaging 6-8 hours of sleep every night and does not feel tired when she wakes up in the am.    on bipap per [facility] with last HCO3 33 and no daytime drowsiness, just bad fatigue p work. Felt much better at the beach recently with good ex tol   rec No change rx   04/27/2014 f/u ov/Bostyn Kunkler re: obesity/ ohs/hbp/djd  Chief Complaint  Patient presents with  . Annual Exam    Pt fasting. Overall doing well and denies any co's today.  no reg exercise, very tired in pms  > working out issues of sleep with [name] but he has not reviewed download  yet   No obvious daytime variabilty or assoc sob or chronic cough or cp or chest tightness, subjective wheeze overt  hb symptoms. No unusual exp hx or h/o childhood pna/ asthma or knowledge of premature birth.   Sleeping ok on bipap without nocturnal  or early am exacerbation  of respiratory  c/o's or need for noct saba. Also denies any obvious fluctuation of symptoms with weather or environmental changes or other aggravating or alleviating factors except as outlined above  Current Medications, Allergies,Complete  Past Medical History, Past Surgical History, Family History, and Social History were reviewed in [employer] record.  ROS  The following are not active complaints unless bolded sore throat, dysphagia, dental problems, itching, sneezing,   excess/ purulent secretions, ear ache,   fever, chills, sweats, unintended wt loss, pleuritic or exertional cp, hemoptysis,  orthopnea pnd or leg swelling resolved, presyncope, palpitations, heartburn, abdominal pain, anorexia, nausea, vomiting, diarrhea  or change in bowel or urinary habits, change in stools or urine, dysuria,hematuria,  rash, arthralgias, visual complaints, headache, numbness weakness or ataxia or problems with walking or coordination,  change in mood/affect or memory.                   Past Medical History:  HIATAL HERNIA (ICD-553.3) with GERD  - H/o Barretts  esophagus  - EGD pos HH/ stricture 04/18/10 , neg barrett's  RHINITIS, CHRONIC (ICD-472.0)  - Remote allergy testing = molds/dust only  - Sinus CT 09/06/08 mild thickending only  OBSTRUCTIVE SLEEP APNEA (ICD-327.23)..................................................[name]  --PSG 03/31/02 AHI 64  --Nocturnal BIPAP  MORBID OBESITY (ICD-278.01)  - Target wt = 158 for BMI < 30  POSITIVE PPD (ICD-795.5)  DIVERTICULOSIS  HEALTH MAINTENANCE........................................................Marland KitchenWert  - DT May 23, 2009  - Pneumovax 04/2013,  Prevnar 04/27/2014  - CPX  04/27/2014  - GYN Levoix  Complex med regimen  -------Meds reviewed with pt education and computerized med calendar completed September 10, 2009      Family History:  Father - CAD, ? 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  Never smoker, but Husband smoked / passed [id number] Illicit Drug Use - no  Patient does not get regular exercise.  No etoh         .           Objective:   Physical Exam  wt 224 May 23, 2009 > 226 September 10, 2009 > 229 November 05, 2009 > 232 June 03, 2010 > 224 05/25/2011 >230 01/11/12 > 03/04/2012  225 > 05/17/2012  219 >217 07/29/2012 > 219 09/06/2012 >223 03/16/2013 > 04/11/2013  225 > 05/22/2013  225 > 225 06/23/2013 >  02/27/2014 215 > 04/27/2014  215 Ambulatory obese  wf in no acute distress.  HEENT: nl dentition, and orophanx. Nl external ear canals without cough reflex, elongated uvula, thick tongue.  Neck without JVD/Nodes/TM  Lungs clear to A and P bilaterally without cough on insp or exp maneuvers  RRR no s3 or murmur or increase in P2.   No sign edema Abd soft and obese but benign with nl excursion in the supine position. No bruits or organomegaly  Ext warm without calf tenderness, cyanosis clubbing    No deformity noted.  Skin warm and dry without lesions  MS nl gait, no deformities Neuro alert, pleasant, no motor def      CXR  04/27/2014 :  Cardiac shadow is stable. The lungs are clear bilaterally. Mild  degenerative changes of the thoracic spine are noted.       Assessment & Plan:

## 2014-04-27 NOTE — Progress Notes (Signed)
Quick Note:  Pt aware ______ 

## 2014-04-27 NOTE — Assessment & Plan Note (Signed)
Adequate control on present rx, reviewed > no change in rx needed  But did review need to call if any orthostasis develops and hold bp meds  if po intake interupted for any reason as bp on low side on present rx

## 2014-04-27 NOTE — Assessment & Plan Note (Signed)
-   Prevnar 04/27/2014  - GI screening [facility] Plan - GYN  [facility] - Mammos neg 10/05/13

## 2014-04-27 NOTE — Patient Instructions (Addendum)
Please remember to go to the lab and x-ray department downstairs for your tests - we will call you with the results when they are available.    Prevnar today   Dr [name] will be in touch re your bipap settings  Please schedule a follow up visit in 6 months but call sooner if needed

## 2014-04-30 NOTE — Assessment & Plan Note (Signed)
-   on Bipap chronically - HC03 up to 33 on bmet 12/20/13 > referred back to Dr [name]  ONO with BiPAP and RA 04/21/14 >> Test time 8 hrs 23 min.  Basal SpO2 93%, low SpO2 75%.  Spent 31 min with SpO2 < 88%.  F/u with Dr [name] discussed with pt and Dr [name]

## 2014-04-30 NOTE — Assessment & Plan Note (Signed)
Lab Results  Component Value Date   CHOL 187 04/27/2014   HDL 35.30* 04/27/2014   LDLCALC 119* 04/27/2014   LDLDIRECT 140.4 05/22/2013   TRIG 164.0* 04/27/2014   CHOLHDL 5 04/27/2014     Target < 130, work on diet.

## 2014-04-30 NOTE — Assessment & Plan Note (Signed)
-   Dexa 06/15/11  Spine  0.2  L Fem Neck -1.1,   R Fem Neck -1.8  Recheck vit D Bone densitometry listed as ordered but not done

## 2014-04-30 NOTE — Assessment & Plan Note (Signed)
-   Remote allergy testing = molds/dust only  - Sinus CT 09/06/08 mild thickening only    Adequate control on present rx, reviewed > no change in rx needed

## 2014-05-01 ENCOUNTER — Telehealth: Payer: Self-pay | Admitting: *Deleted

## 2014-05-01 DIAGNOSIS — M8580 Other specified disorders of bone density and structure, unspecified site: Secondary | ICD-10-CM

## 2014-05-01 NOTE — Telephone Encounter (Signed)
Message copied by [name] on Tue May 01, 2014  5:13 PM ------      Message from: Christinia Gully B      Created: Mon Apr 30, 2014  7:33 AM       Bone densitometry listed as ordered but not done > see if she can schedule it before the end of the year ------

## 2014-05-01 NOTE — Telephone Encounter (Signed)
LMTCB

## 2014-05-01 NOTE — Progress Notes (Signed)
Quick Note:  Spoke with pt and notified of results per Dr. [name]. Pt verbalized understanding and denied any questions.  ______ 

## 2014-05-02 ENCOUNTER — Other Ambulatory Visit: Payer: Self-pay

## 2014-05-02 ENCOUNTER — Encounter: Payer: Self-pay | Admitting: Gastroenterology

## 2014-05-02 DIAGNOSIS — M8580 Other specified disorders of bone density and structure, unspecified site: Secondary | ICD-10-CM

## 2014-05-02 NOTE — Telephone Encounter (Signed)
LMOM TCB x1 on her home number and work number

## 2014-05-02 NOTE — Telephone Encounter (Signed)
Pt returning call, - [phone]

## 2014-05-02 NOTE — Telephone Encounter (Signed)
Called and spoke with pt and order has been placed to get her BMD done at [place].  Nothing further is needed.

## 2014-05-02 NOTE — Telephone Encounter (Signed)
Pt returning call- [phone] ext [phone] or ext [phone]

## 2014-05-07 ENCOUNTER — Encounter: Payer: PRIVATE HEALTH INSURANCE | Admitting: Gastroenterology

## 2014-05-07 ENCOUNTER — Telehealth: Payer: Self-pay | Admitting: Pulmonary Disease

## 2014-05-07 DIAGNOSIS — G4733 Obstructive sleep apnea (adult) (pediatric): Secondary | ICD-10-CM

## 2014-05-07 NOTE — Telephone Encounter (Signed)
ONO with BiPAP and RA 04/21/14 >> test time 8 hrs 23 min.  Basal SpO2 93.4%, low SpO2 75%.  Spent 31 min with SpO2 < 88%.  Will have my nurse inform pt that her oxygen level is low in spite of using BiPAP.  Will call her once I review BiPAP download (please ensure this gets delivered to me).  Will then determine what adjustments she needs to her sleep apnea regimen.

## 2014-05-09 NOTE — Telephone Encounter (Signed)
Results have been explained to patient, pt expressed understanding. Nothing further needed.  

## 2014-05-11 ENCOUNTER — Encounter: Payer: Self-pay | Admitting: Internal Medicine

## 2014-05-14 ENCOUNTER — Telehealth: Payer: Self-pay | Admitting: Gastroenterology

## 2014-05-14 NOTE — Telephone Encounter (Signed)
Patient with several weeks of diarrhea.  She is scheduled for office visit for 05/21/14 3:45

## 2014-05-14 NOTE — Telephone Encounter (Signed)
Left message for patient to call back  

## 2014-05-16 ENCOUNTER — Telehealth: Payer: Self-pay | Admitting: Pulmonary Disease

## 2014-05-16 DIAGNOSIS — E662 Morbid (severe) obesity with alveolar hypoventilation: Secondary | ICD-10-CM

## 2014-05-16 DIAGNOSIS — G4733 Obstructive sleep apnea (adult) (pediatric): Secondary | ICD-10-CM

## 2014-05-16 NOTE — Telephone Encounter (Signed)
Called pt. She is wanting to know if Dr. [name] has received her BIPAP results yet. If not, then need to call for results. Please advise [name] thanks

## 2014-05-18 NOTE — Telephone Encounter (Signed)
BIPAP D/L received. Placed in VS look at to address.  Please advise Dr [name]. Thanks.

## 2014-05-18 NOTE — Telephone Encounter (Signed)
LM with patient make aware that we are in the process of having this faxed over.  Spoke with [facility]-- D/L to be faxed for VS to review to triage fax. Will await fax

## 2014-05-21 ENCOUNTER — Telehealth: Payer: Self-pay | Admitting: Internal Medicine

## 2014-05-21 ENCOUNTER — Ambulatory Visit: Payer: PRIVATE HEALTH INSURANCE | Admitting: Gastroenterology

## 2014-05-21 NOTE — Telephone Encounter (Signed)
Spoke with the pt  She is asking when DEXA is going to be set up  I placed the order for this to be done on 05/02/14  Pt wants this done at [facility]  Will forward to [facility] to schedule thanks

## 2014-05-21 NOTE — Telephone Encounter (Signed)
Order has been placed.  Looks like the BMD has already been done.  Called and lmomtcb x 1 for the pt

## 2014-05-21 NOTE — Telephone Encounter (Signed)
Pt returning call.[name] ° °

## 2014-05-22 NOTE — Telephone Encounter (Signed)
[email or web address] 06/25/14@1pm  pt is aware order faxed to

## 2014-05-22 NOTE — Telephone Encounter (Signed)
Order faxed to [id number] pt aware [name]

## 2014-06-04 NOTE — Telephone Encounter (Signed)
BIPAP download received. Given to VS.  Please advise. Thanks.

## 2014-06-04 NOTE — Telephone Encounter (Signed)
ONO with BiPAP and RA 04/21/14 >> test time 8 hrs 23 min. Basal SpO2 93.4%, low SpO2 75%. Spent 31 min with SpO2 < 88%.  BiPAP 03/23/14 to 04/21/14 >> used on 30 of 30 nights with average 9 hrs and 30 min.  Average AHI is 2.3 with BiPAP 14/9 cm H2O.  Will have my nurse inform pt that BiPAP report shows good control of sleep apnea with current set up.  Her ONO showed low oxygen level >> she will need to start using 1 liter oxygen at night with BiPAP (I have sent order).

## 2014-06-04 NOTE — Telephone Encounter (Signed)
I do not have this download.  Was not in my look at section.

## 2014-06-04 NOTE — Telephone Encounter (Addendum)
Document was in scan folder as this was signed off on to be scanned into patient chart.  It is not a download, it is an ONO on BiPAP.  This has been addressed Virl Cagey, CMA at 05/09/2014 1:49 PM    Status: Signed       Results have been explained to patient, pt expressed understanding. Nothing further needed.        Chesley Mires, MD at 05/07/2014 1:52 PM     Status: Signed        ONO with BiPAP and RA 04/21/14 >> test time 8 hrs 23 min. Basal SpO2 93.4%, low SpO2 75%. Spent 31 min with SpO2 < 88%.  Will have my nurse inform pt that her oxygen level is low in spite of using BiPAP. Will call her once I review BiPAP download (please ensure this gets delivered to me). Will then determine what adjustments she needs to her sleep apnea regimen.   Will call patient to see if anything further needed.

## 2014-06-04 NOTE — Telephone Encounter (Signed)
Okay.  Please have her BiPAP download sent.  Will call her back once BiPAP download reviewed.

## 2014-06-05 ENCOUNTER — Telehealth: Payer: Self-pay | Admitting: Pulmonary Disease

## 2014-06-05 DIAGNOSIS — G4733 Obstructive sleep apnea (adult) (pediatric): Secondary | ICD-10-CM

## 2014-06-05 NOTE — Telephone Encounter (Signed)
ONO was done on August 04/21/14  Pt must have qualifying sats within 30 days of order or ono will have to be repeated to and new order placed. Lenna Sciara will place phone note to address this issue. Carlos Levering Cobb     VS please advise if you would like to go ahead with another ONO on patient since the one from 03-2014 is too old to use for this O2 order. Thanks.

## 2014-06-06 NOTE — Telephone Encounter (Signed)
LM with pt x 1 See 06/05/14 phone note-- unable to order O2 at this time d/t needing additional testing.

## 2014-06-06 NOTE — Telephone Encounter (Signed)
Please inform pt that insurance requirements dictate that she have an in lab bipap titration study starting on room air to document that her sleep apnea is controlled before she could qualify for home oxygen with bipap.  Please then arrange for bipap titration study in sleep lab.

## 2014-06-06 NOTE — Telephone Encounter (Signed)
Spoke with [place] that since the patient is on BIPAP it is required to have a BiPAP titration to prove that her sleep apnea is controlled before being allowed to get supplemental oxygen. Please advise Dr [name]. Thanks.

## 2014-06-06 NOTE — Telephone Encounter (Signed)
LMTCB for Melissa x 1

## 2014-06-06 NOTE — Telephone Encounter (Signed)
Spoke with pt and advised of need to have bipap titration study.  Pt verbalized understanding.  Order placed.

## 2014-06-06 NOTE — Telephone Encounter (Signed)
Patient calling to speak to nurse. [name]

## 2014-06-06 NOTE — Telephone Encounter (Signed)
Check with another DME and that is correct. Since patient is on Bipap, she will need to have a Bipap titration study . Will need order for study. Rhonda J Cobb

## 2014-06-06 NOTE — Telephone Encounter (Signed)
Okay to repeat ONO with BiPAP on room air.  Please verify that this will qualify her for oxygen at night, or if she needs to have in lab BiPAP titration to prove that her sleep apnea is controlled before being allowed to get supplemental oxygen.

## 2014-06-07 NOTE — Telephone Encounter (Signed)
LM x 2

## 2014-06-07 NOTE — Telephone Encounter (Signed)
Chesley Mires, MD at 06/06/2014 1:41 PM     Status: Signed        Please inform pt that insurance requirements dictate that she have an in lab bipap titration study starting on room air to document that her sleep apnea is controlled before she could qualify for home oxygen with bipap. Please then arrange for bipap titration study in sleep lab.   Pt aware of rec's per VS Pt scheduled for Jul 12, 2014 for BiPAP Titration study in lab. Nothing further needed.

## 2014-06-18 ENCOUNTER — Telehealth: Payer: Self-pay | Admitting: Pulmonary Disease

## 2014-06-18 NOTE — Telephone Encounter (Signed)
Called and spoke to pt. Pt stated she will need to cancel her sleep study and reschedule d/t work schedule.   PCC's please advise.

## 2014-06-19 NOTE — Telephone Encounter (Signed)
Spoke to pt she will call sleep ctr directly and reschedule npsg # given to her [name]

## 2014-06-25 ENCOUNTER — Ambulatory Visit (AMBULATORY_SURGERY_CENTER): Payer: Self-pay | Admitting: *Deleted

## 2014-06-25 VITALS — Ht 62.0 in | Wt 216.2 lb

## 2014-06-25 DIAGNOSIS — Z1211 Encounter for screening for malignant neoplasm of colon: Secondary | ICD-10-CM

## 2014-06-25 MED ORDER — MOVIPREP 100 G PO SOLR: 1.0000 | Freq: Once | ORAL | Status: DC

## 2014-06-25 NOTE — Progress Notes (Signed)
No egg or soy allergy. ewm No home 02 use. ewm No diet pills, no blood thinners. ewm No problems with past sedation. With general anesthesia, pt hs N/V. ewm  Pt declined emmi. ewm Pt states with her last colon, she vomited uncontrollably with her prep. She had to call the MD and get changed to miralax with gatorade and pt requested the same prep with this colon. miralax split dose prep given. ewm

## 2014-07-03 ENCOUNTER — Ambulatory Visit: Payer: PRIVATE HEALTH INSURANCE | Admitting: Psychology

## 2014-07-09 ENCOUNTER — Encounter: Payer: Self-pay | Admitting: Gastroenterology

## 2014-07-09 ENCOUNTER — Ambulatory Visit (AMBULATORY_SURGERY_CENTER): Payer: PRIVATE HEALTH INSURANCE | Admitting: Gastroenterology

## 2014-07-09 VITALS — BP 118/77 | HR 68 | Temp 98.1°F | Resp 24 | Ht 62.0 in | Wt 216.2 lb

## 2014-07-09 DIAGNOSIS — I10 Essential (primary) hypertension: Secondary | ICD-10-CM | POA: Diagnosis not present

## 2014-07-09 DIAGNOSIS — J45909 Unspecified asthma, uncomplicated: Secondary | ICD-10-CM | POA: Diagnosis not present

## 2014-07-09 DIAGNOSIS — G4733 Obstructive sleep apnea (adult) (pediatric): Secondary | ICD-10-CM | POA: Diagnosis not present

## 2014-07-09 DIAGNOSIS — F341 Dysthymic disorder: Secondary | ICD-10-CM | POA: Diagnosis not present

## 2014-07-09 DIAGNOSIS — Z1211 Encounter for screening for malignant neoplasm of colon: Secondary | ICD-10-CM | POA: Diagnosis not present

## 2014-07-09 MED ORDER — SODIUM CHLORIDE 0.9 % IV SOLN: 500.0000 mL | INTRAVENOUS | Status: DC

## 2014-07-09 NOTE — Progress Notes (Signed)
Report to PACU, RN, vss, BBS= Clear.  

## 2014-07-09 NOTE — Patient Instructions (Signed)
Discharge instructions given. Handouts on diverticulosis and gastritis. Resume previous medications. YOU HAD AN ENDOSCOPIC PROCEDURE TODAY AT [facility]: Refer to the procedure report that was given to you for any specific questions about what was found during the examination.  If the procedure report does not answer your questions, please call your gastroenterologist to clarify.  If you requested that your care partner not be given the details of your procedure findings, then the procedure report has been included in a sealed envelope for you to review at your convenience later.  YOU SHOULD EXPECT: Some feelings of bloating in the abdomen. Passage of more gas than usual.  Walking can help get rid of the air that was put into your GI tract during the procedure and reduce the bloating. If you had a lower endoscopy (such as a colonoscopy or flexible sigmoidoscopy) you may notice spotting of blood in your stool or on the toilet paper. If you underwent a bowel prep for your procedure, then you may not have a normal bowel movement for a few days.  DIET: Your first meal following the procedure should be a light meal and then it is ok to progress to your normal diet.  A half-sandwich or bowl of soup is an example of a good first meal.  Heavy or fried foods are harder to digest and may make you feel nauseous or bloated.  Likewise meals heavy in dairy and vegetables can cause extra gas to form and this can also increase the bloating.  Drink plenty of fluids but you should avoid alcoholic beverages for 24 hours.  ACTIVITY: Your care partner should take you home directly after the procedure.  You should plan to take it easy, moving slowly for the rest of the day.  You can resume normal activity the day after the procedure however you should NOT DRIVE or use heavy machinery for 24 hours (because of the sedation medicines used during the test).    SYMPTOMS TO REPORT IMMEDIATELY: A gastroenterologist  can be reached at any hour.  During normal business hours, 8:30 AM to 5:00 PM Monday through Friday, call [phone].  After hours and on weekends, please call the GI answering service at [phone] who will take a message and have the physician on call contact you.   Following lower endoscopy (colonoscopy or flexible sigmoidoscopy):  Excessive amounts of blood in the stool  Significant tenderness or worsening of abdominal pains  Swelling of the abdomen that is new, acute  Fever of 100F or higher  FOLLOW UP: If any biopsies were taken you will be contacted by phone or by letter within the next 1-3 weeks.  Call your gastroenterologist if you have not heard about the biopsies in 3 weeks.  Our staff will call the home number listed on your records the next business day following your procedure to check on you and address any questions or concerns that you may have at that time regarding the information given to you following your procedure. This is a courtesy call and so if there is no answer at the home number and we have not heard from you through the emergency physician on call, we will assume that you have returned to your regular daily activities without incident.  SIGNATURES/CONFIDENTIALITY: You and/or your care partner have signed paperwork which will be entered into your electronic medical record.  These signatures attest to the fact that that the information above on your After Visit Summary has been reviewed  and is understood.  Full responsibility of the confidentiality of this discharge information lies with you and/or your care-partner. 

## 2014-07-09 NOTE — Op Note (Signed)
[place]  [address] [place], [id number]   COLONOSCOPY PROCEDURE REPORT  PATIENT: [name], [name]  MR#: [id number] BIRTHDATE: Dec 02, 1945 , 89  yrs. old GENDER: female ENDOSCOPIST: Ladene Artist, MD, [facility] PROCEDURE DATE:  07/09/2014 PROCEDURE:   Colonoscopy, screening First Screening Colonoscopy - Avg.  risk and is 50 yrs.  old or older - No.  Prior Negative Screening - Now for repeat screening. 10 or more years since last screening  History of Adenoma - Now for follow-up colonoscopy & has been > or = to 3 yrs.  N/A  Polyps Removed Today? No.  Polyps Removed Today? No.  Recommend repeat exam, <10 yrs? Polyps Removed Today? No.  Recommend repeat exam, <10 yrs? No. ASA CLASS:   Class III INDICATIONS:average risk for colorectal cancer. MEDICATIONS: Monitored anesthesia care and Propofol 220 mg IV DESCRIPTION OF PROCEDURE:   After the risks benefits and alternatives of the procedure were thoroughly explained, informed consent was obtained.  The digital rectal exam revealed no abnormalities of the rectum.   The LB [id number]  endoscope was introduced through the anus and advanced to the cecum, which was identified by both the appendix and ileocecal valve. No adverse events experienced.   The quality of the prep was adequate, using MoviPrep  The instrument was then slowly withdrawn as the colon was fully examined.  COLON FINDINGS: There was mild diverticulosis noted in the descending colon, sigmoid colon, ascending colon, and transverse colon.   The examination was otherwise normal.  Retroflexed views revealed no abnormalities. The time to cecum=3 minutes 31 seconds. Withdrawal time=11 minutes 08 seconds.  The scope was withdrawn and the procedure completed.  COMPLICATIONS: There were no immediate complications.  ENDOSCOPIC IMPRESSION: 1.   Mild diverticulosis in the descending colon, sigmoid colon, ascending colon, and transverse colon 2.   The  examination was otherwise normal  RECOMMENDATIONS: 1.  High fiber diet with liberal fluid intake. 2.  Continue to follow colorectal cancer screening guidelines for "routine risk" patients with a repeat colonoscopy in 10 years. There is no need for routine, screening FOBT (stool) testing for at least 5 years.  eSigned:  Ladene Artist, MD, [facility] 07/09/2014 9:10 AM

## 2014-07-10 ENCOUNTER — Telehealth: Payer: Self-pay

## 2014-07-10 NOTE — Telephone Encounter (Signed)
Left a message at [phone] ext [phone] at 07:32 for the pt to call us back if any questions or concerns. maw

## 2014-07-12 ENCOUNTER — Encounter (HOSPITAL_BASED_OUTPATIENT_CLINIC_OR_DEPARTMENT_OTHER): Payer: PRIVATE HEALTH INSURANCE

## 2014-07-12 ENCOUNTER — Ambulatory Visit: Payer: PRIVATE HEALTH INSURANCE | Admitting: Pulmonary Disease

## 2014-07-14 ENCOUNTER — Other Ambulatory Visit: Payer: Self-pay | Admitting: Internal Medicine

## 2014-08-03 ENCOUNTER — Ambulatory Visit: Payer: PRIVATE HEALTH INSURANCE | Admitting: Pulmonary Disease

## 2014-08-07 ENCOUNTER — Ambulatory Visit: Payer: PRIVATE HEALTH INSURANCE | Admitting: Psychology

## 2014-08-07 ENCOUNTER — Encounter: Payer: Self-pay | Admitting: Pulmonary Disease

## 2014-08-31 ENCOUNTER — Encounter (HOSPITAL_BASED_OUTPATIENT_CLINIC_OR_DEPARTMENT_OTHER): Payer: PRIVATE HEALTH INSURANCE

## 2014-09-12 ENCOUNTER — Other Ambulatory Visit: Payer: Self-pay | Admitting: Gastroenterology

## 2014-09-12 ENCOUNTER — Other Ambulatory Visit: Payer: Self-pay | Admitting: Internal Medicine

## 2014-09-19 DIAGNOSIS — M25512 Pain in left shoulder: Secondary | ICD-10-CM | POA: Diagnosis not present

## 2014-09-19 DIAGNOSIS — M25511 Pain in right shoulder: Secondary | ICD-10-CM | POA: Diagnosis not present

## 2014-09-20 ENCOUNTER — Encounter (HOSPITAL_BASED_OUTPATIENT_CLINIC_OR_DEPARTMENT_OTHER): Payer: PRIVATE HEALTH INSURANCE

## 2014-09-27 DIAGNOSIS — L918 Other hypertrophic disorders of the skin: Secondary | ICD-10-CM | POA: Diagnosis not present

## 2014-09-27 DIAGNOSIS — L814 Other melanin hyperpigmentation: Secondary | ICD-10-CM | POA: Diagnosis not present

## 2014-09-27 DIAGNOSIS — L820 Inflamed seborrheic keratosis: Secondary | ICD-10-CM | POA: Diagnosis not present

## 2014-10-17 ENCOUNTER — Ambulatory Visit: Payer: PRIVATE HEALTH INSURANCE | Admitting: Pulmonary Disease

## 2014-11-21 ENCOUNTER — Telehealth: Payer: Self-pay | Admitting: Pulmonary Disease

## 2014-11-21 DIAGNOSIS — G4733 Obstructive sleep apnea (adult) (pediatric): Secondary | ICD-10-CM

## 2014-11-21 NOTE — Telephone Encounter (Signed)
Spoke with the pt  She states that she is unable to keep appt for BIPAP titration study Test costs 3000$ even with insurance helping cover this  She is doing well with BIPAP, better than when she was seen here last  She is asking if she needs to keep ov with VS on 12/12/14  She will be happy to keep ov needed

## 2014-11-21 NOTE — Telephone Encounter (Signed)
lmtcb x1 

## 2014-11-22 NOTE — Telephone Encounter (Signed)
Spoke with pt, she is ok with ONO and aware to keep her rov on 12/12/14.  ono ordered.  Nothing further needed.

## 2014-11-22 NOTE — Telephone Encounter (Signed)
Main reason for doing in lab titration study was insurance requires her to have titration study using BiPAP to document her sleep apnea is controlled on BiPAP before allowing home oxygen set up.  Please arrange for repeat ONO while using BiPAP 14/9 cm H2O.  This should not be done with supplemental oxygen.    If her oxygen levels are better than what was seen on ONO from 04/21/14, then she might not need supplemental oxygen at night and therefore would not need in lab BiPAP titration study.  She should keep appointment for 12/12/14.

## 2014-11-23 ENCOUNTER — Encounter (HOSPITAL_BASED_OUTPATIENT_CLINIC_OR_DEPARTMENT_OTHER): Payer: PRIVATE HEALTH INSURANCE

## 2014-12-12 ENCOUNTER — Ambulatory Visit (INDEPENDENT_AMBULATORY_CARE_PROVIDER_SITE_OTHER): Payer: PRIVATE HEALTH INSURANCE | Admitting: Pulmonary Disease

## 2014-12-12 ENCOUNTER — Encounter: Payer: Self-pay | Admitting: Pulmonary Disease

## 2014-12-12 VITALS — BP 120/64 | HR 76 | Ht 62.0 in | Wt 219.0 lb

## 2014-12-12 DIAGNOSIS — G4733 Obstructive sleep apnea (adult) (pediatric): Secondary | ICD-10-CM

## 2014-12-12 DIAGNOSIS — E662 Morbid (severe) obesity with alveolar hypoventilation: Secondary | ICD-10-CM | POA: Diagnosis not present

## 2014-12-12 NOTE — Progress Notes (Signed)
Chief Complaint  Patient presents with  . Follow-up    Review ONO.     History of Present Illness: [name] is a 69 y.o. female with OSA.  She has been doing well with BiPAP.  She had recent ONO on BiPAP, and this again showed significant oxygen desaturation.  Unfortunately, she was told that she need in lab sleep study for insurance coverage prior to setting up home oxygen at night.  She was also told sleep study in our system would cost her $3000 dollars.   TESTS: PSG 03/31/02 >> AHI 64  BiPAP titration 12/01/07 >> 14/9 cm H2O >> centrals with higher pressures.  ONO with BiPAP and RA 04/21/14 >> test time 8 hrs 23 min. Basal SpO2 93.4%, low SpO2 75%. Spent 31 min with SpO2 < 88%. BiPAP 03/23/14 to 04/21/14 >> used on 30 of 30 nights with average 9 hrs and 30 min. Average AHI is 2.3 with BiPAP 14/9 cm H2O. ONO with BiPAP 11/27/14 >> test time 7 hrs 20 min.  Mean SpO2 91.8%, low SpO2 74%.  Spent 59 min with SpO2 < 88%.  Past medical hx >> HH, GERD, Barrett's esophagus, Allergic rhinitis, Depression, Anxiety, HTN, Osteoporosis  Past surgical hx, Medications, Allergies, Family hx, Social hx all reviewed.   Physical Exam: Blood pressure 120/64, pulse 76, height 5\' 2"  (1.575 m), weight 219 lb (99.338 kg), SpO2 95 %. Body mass index is 40.05 kg/(m^2).  General - No distress ENT - No sinus tenderness, no oral exudate, no LAN Cardiac - s1s2 regular, no murmur Chest - No wheeze/rales/dullness Back - No focal tenderness Abd - Soft, non-tender Ext - No edema Neuro - Normal strength Skin - No rashes Psych - normal mood, and behavior   Assessment/Plan:  Obstructive sleep apnea. She is compliant with BiPAP. Plan: - continue BiPAP 14/9 cm H2O  Obesity hypoventilation syndrome. Her overnight oximetry again showed significant oxygen desaturation, and she would benefit from supplemental oxygen therapy at night in addition to BiPAP.  Unfortunately, she is required by her insurance  provider to have in lab sleep study with BiPAP prior to getting approval for home oxygen set up. Plan: - she will check with her insurance company to determine what is the best option for getting sleep study done, or whether she could just purchase home oxygen set up on her own   Chesley Mires, MD [place] Pager:  [phone]

## 2014-12-12 NOTE — Patient Instructions (Signed)
Will have you speak with patient care coordinators about setting up BiPAP titration study Follow up in 6 months

## 2014-12-19 ENCOUNTER — Telehealth: Payer: Self-pay | Admitting: Pulmonary Disease

## 2014-12-19 NOTE — Telephone Encounter (Signed)
Will forward to VS as an FYI.   

## 2014-12-24 ENCOUNTER — Encounter: Payer: Self-pay | Admitting: Internal Medicine

## 2015-02-01 ENCOUNTER — Encounter: Payer: Self-pay | Admitting: Pulmonary Disease

## 2015-02-08 ENCOUNTER — Ambulatory Visit (HOSPITAL_BASED_OUTPATIENT_CLINIC_OR_DEPARTMENT_OTHER): Payer: PRIVATE HEALTH INSURANCE | Attending: Pulmonary Disease

## 2015-02-20 ENCOUNTER — Ambulatory Visit: Payer: PRIVATE HEALTH INSURANCE | Admitting: Pulmonary Disease

## 2015-04-02 ENCOUNTER — Other Ambulatory Visit: Payer: Self-pay | Admitting: Gastroenterology

## 2015-04-21 DIAGNOSIS — B009 Herpesviral infection, unspecified: Secondary | ICD-10-CM | POA: Diagnosis not present

## 2015-04-23 ENCOUNTER — Encounter: Payer: Self-pay | Admitting: Gastroenterology

## 2015-04-23 ENCOUNTER — Ambulatory Visit (INDEPENDENT_AMBULATORY_CARE_PROVIDER_SITE_OTHER): Payer: PRIVATE HEALTH INSURANCE | Admitting: Gastroenterology

## 2015-04-23 ENCOUNTER — Other Ambulatory Visit (INDEPENDENT_AMBULATORY_CARE_PROVIDER_SITE_OTHER): Payer: PRIVATE HEALTH INSURANCE

## 2015-04-23 VITALS — BP 134/68 | HR 80 | Ht 62.0 in | Wt 224.0 lb

## 2015-04-23 DIAGNOSIS — K219 Gastro-esophageal reflux disease without esophagitis: Secondary | ICD-10-CM | POA: Diagnosis not present

## 2015-04-23 DIAGNOSIS — R197 Diarrhea, unspecified: Secondary | ICD-10-CM

## 2015-04-23 DIAGNOSIS — M19012 Primary osteoarthritis, left shoulder: Secondary | ICD-10-CM | POA: Diagnosis not present

## 2015-04-23 MED ORDER — PANTOPRAZOLE SODIUM 40 MG PO TBEC: DELAYED_RELEASE_TABLET | ORAL | Status: DC

## 2015-04-23 NOTE — Patient Instructions (Signed)
We have sent the following medications to your pharmacy for you to pick up at your convenience: Protonix.   Your physician has requested that you go to the basement for the following lab work before leaving today:TTG, IGA.   You can try several over the counter probiotics such as Align, Florastor or Restora.   You can take Imodium once daily as needed for diarrhea.   Thank you for choosing me and [facility].  Pricilla Riffle. Dagoberto Ligas., MD., [facility] Regal

## 2015-04-23 NOTE — Progress Notes (Signed)
    History of Present Illness: This is a 69 year old female returning for follow-up of GERD. She states her reflux symptoms are under very good control on daily pantoprazole. She notes a new problem with frequent morning diarrhea occurring about 30 minutes after breakfast. Symptoms seem to occur no matter what she eats for breakfast. She underwent colonoscopy last year which showed only diverticulosis. Denies recent and a probiotic usage. No recent medication changes.  Current Medications, Allergies, Past Medical History, Past Surgical History, Family History and Social History were reviewed in [employer] record.  Physical Exam: General: Well developed , well nourished, no acute distress Head: Normocephalic and atraumatic Eyes:  sclerae anicteric, EOMI Ears: Normal auditory acuity Mouth: No deformity or lesions Lungs: Clear throughout to auscultation Heart: Regular rate and rhythm; no murmurs, rubs or bruits Abdomen: Soft, non tender and non distended. No masses, hepatosplenomegaly or hernias noted. Normal Bowel sounds Musculoskeletal: Symmetrical with no gross deformities  Pulses:  Normal pulses noted Extremities: No clubbing, cyanosis, edema or deformities noted Neurological: Alert oriented x 4, grossly nonfocal Psychological:  Alert and cooperative. Normal mood and affect  Assessment and Recommendations:  1. GERD. Refilled pantoprazole 40 mg daily and continue standard antireflux measures.  2. Diarrhea. Check tTG and IgA. Suspected functional diarrhea. Trial of several different probiotics for 2 weeks each. Imodium right ear every morning as needed. Call if symptoms do not improve or worsen.

## 2015-04-24 ENCOUNTER — Encounter: Payer: Self-pay | Admitting: Internal Medicine

## 2015-04-24 ENCOUNTER — Ambulatory Visit (INDEPENDENT_AMBULATORY_CARE_PROVIDER_SITE_OTHER): Payer: PRIVATE HEALTH INSURANCE | Admitting: Internal Medicine

## 2015-04-24 VITALS — BP 138/76 | HR 83 | Ht 62.0 in | Wt 221.8 lb

## 2015-04-24 DIAGNOSIS — I10 Essential (primary) hypertension: Secondary | ICD-10-CM | POA: Diagnosis not present

## 2015-04-24 DIAGNOSIS — B029 Zoster without complications: Secondary | ICD-10-CM | POA: Insufficient documentation

## 2015-04-24 LAB — TISSUE TRANSGLUTAMINASE, IGA: TISSUE TRANSGLUTAMINASE AB, IGA: 1 U/mL (ref <4)

## 2015-04-24 NOTE — Assessment & Plan Note (Addendum)
Body mass index is 40.56 trending up noted  Lab Results  Component Value Date   TSH 1.23 04/27/2014     Contributing to gerd tendency/ doe/reviewed need  achieve and maintain neg calorie balance

## 2015-04-24 NOTE — Assessment & Plan Note (Signed)
T 5/6 on R onset symptoms 04/19/15 > rx acyclovir x 10 days started 04/21/15  Pain is better, no vesicles or pustules or crusting/ pain better > no change rx

## 2015-04-24 NOTE — Patient Instructions (Signed)
zostrix can be applied 4x daily if pain gets worse as the rash goes away   Keep your appt for your physical.

## 2015-04-24 NOTE — Assessment & Plan Note (Signed)
Assoc with palpitations but Pulse fine here/ stress at work related   Adequate control on present rx, reviewed > no change in rx needed  For now / low threshold to add BB if needed  Needs cpx next month due  Each maintenance medication was reviewed in detail including most importantly the difference between maintenance and as needed and under what circumstances the prns are to be used.  Please see instructions for details which were reviewed in writing and the patient given a copy.

## 2015-04-24 NOTE — Progress Notes (Signed)
Subjective:    Patient ID: [name], female    DOB: 1946/05/05 .   MRN: [id number]   Brief patient profile:  44 yowf never smoker with morbid obesity complicated by gastroesophageal reflux disease, and also a history of obstructive sleep apnea and chronic rhinitis/ hbp.   History of Present Illness  03/16/2013 Acute OV  Complains of fatigue/malaise x1 month - denies changes in breathing, no additional stressors.  Says over the last 4-6, weeks and she is noticed that she does not have any energy> feels very tired throughout the day. Patient wears her CPAP machine every night for at least 6-8 hours without any noted difficulty.  . Patient says that her depression is improved since beginning Wellbutrin.Takes chlortrimeton Twice daily  For allergy symptoms.  rec Labs > ok tsh, cbc, K 3.1 > Rx with KCL but caused diarrhea so stopped Stop Chlortrimeton for now    06/23/2013 f/u ov/Jawaan Adachi re: hbp f/u Chief Complaint  Patient presents with  . Acute Visit    Pt c/o edema in both feet x 1 wk- left worse than the right.   better when elevates, no calf pain, tendency to dep edema x decades always worse on L rec Change diovan to 160/25 one daily    02/27/2014 f/u ov/Joelee Snoke re: hbp/ osa/ohs/peripheral edema Chief Complaint  Patient presents with  . Acute Visit    Pt c/o fatigue for the past 2 to 3 months. She states "feel tired all of the time".  She is averaging 6-8 hours of sleep every night and does not feel tired when she wakes up in the am.    on bipap per [facility] with last HCO3 33 and no daytime drowsiness, just bad fatigue p work. Felt much better at the beach recently with good ex tol   rec No change rx   04/27/2014 f/u ov/Catheline Hixon re: obesity/ ohs/hbp/djd  Chief Complaint  Patient presents with  . Annual Exam    Pt fasting. Overall doing well and denies any co's today.  no reg exercise, very tired in pms  > working out issues of sleep with [name] but he has not reviewed download  yet rec Please remember to go to the lab and x-ray department downstairs for your tests - we will call you with the results when they are available. Prevnar today  Dr [name] will be in touch re your bipap settings   04/18/17 tingling R chest at bra strap > rx 04/21/15  04/24/2015 f/u ov/Shahidah Nesbitt re: shingles/ palp/ hbp Chief Complaint  Patient presents with  . Acute Visit    Pt states went to UC 4 days ago and was dxed with Shingles. She states she was pain is not as severe as it was, but it has become constant. She also c/o "heart beating out of my chest"- relates to stress. She denies any CP.    stress at work, denies ex cp or p > 100 at rest and no worse palp with activity  Onset R burning 8/26 and dx with shingles 8/28 and improving, only using tylenol with ok pain control   No obvious daytime variabilty or assoc sob or chronic cough or chest tightness, subjective wheeze overt  hb symptoms. No unusual exp hx or h/o childhood pna/ asthma or knowledge of premature birth.   Sleeping ok on bipap without nocturnal  or early am exacerbation  of respiratory  c/o's or need for noct saba. Also denies any obvious fluctuation of symptoms with weather or  environmental changes or other aggravating or alleviating factors except as outlined above  Current Medications, Allergies,Complete  Past Medical History, Past Surgical History, Family History, and Social History were reviewed in [employer] record.  ROS  The following are not active complaints unless bolded sore throat, dysphagia, dental problems, itching, sneezing,   excess/ purulent secretions, ear ache,   fever, chills, sweats, unintended wt loss, pleuritic or exertional cp, hemoptysis,  orthopnea pnd or leg swelling  , presyncope, palpitations, heartburn, abdominal pain, anorexia, nausea, vomiting, diarrhea  or change in bowel or urinary habits, change in stools or urine, dysuria,hematuria,  rash, arthralgias, visual complaints,  headache, numbness weakness or ataxia or problems with walking or coordination,  change in mood/affect or memory.                   Past Medical History:  HIATAL HERNIA (ICD-553.3) with GERD  - H/o Barretts esophagus  - EGD pos HH/ stricture 04/18/10 , neg barrett's  RHINITIS, CHRONIC (ICD-472.0)  - Remote allergy testing = molds/dust only  - Sinus CT 09/06/08 mild thickending only  OBSTRUCTIVE SLEEP APNEA (ICD-327.23)..................................................[name]  --PSG 03/31/02 AHI 64  --Nocturnal BIPAP  MORBID OBESITY (ICD-278.01)  - Target wt = 158 for BMI < 30  POSITIVE PPD (ICD-795.5)  DIVERTICULOSIS  HEALTH MAINTENANCE........................................................Marland KitchenWert  - DT May 23, 2009  - Pneumovax 04/2013, Prevnar 04/27/2014  - CPX  04/27/2014  - GYN Levoix        Family History:  Father - CAD, ? 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  Never smoker, but Husband smoked / passed 1749 Illicit Drug Use - no  Patient does not get regular exercise.  No etoh         .           Objective:   Physical Exam  wt 224 May 23, 2009 > 226 September 10, 2009 > 229 November 05, 2009 > 232 June 03, 2010 > 224 05/25/2011 >230 01/11/12 > 03/04/2012  225 > 05/17/2012  219 >217 07/29/2012 > 219 09/06/2012 >223 03/16/2013 > 04/11/2013  225 > 05/22/2013  225 > 225 06/23/2013 >  02/27/2014 215 > 04/27/2014  215 >  04/24/2015  222  Ambulatory obese  wf in no acute distress.  HEENT: nl dentition, and orophanx. Nl external ear canals without cough reflex, elongated uvula, thick tongue.  Neck without JVD/Nodes/TM  Lungs clear to A and P bilaterally without cough on  insp or exp maneuvers  RRR no s3 or murmur or increase in P2.   No sign edema Abd soft and obese but benign with nl excursion in the supine position. No bruits or organomegaly  Ext warm without calf tenderness, cyanosis clubbing  No deformity noted.  Skin classic grouped erythematous papules s pus/crusting in R T 5/6 distribution MS nl gait, no deformities Neuro alert, pleasant, no motor def      CXR  04/27/2014 :  Cardiac shadow is stable. The lungs are clear bilaterally. Mild  degenerative changes of the thoracic spine are noted.       Assessment & Plan:

## 2015-04-25 ENCOUNTER — Telehealth: Payer: Self-pay | Admitting: Internal Medicine

## 2015-04-25 LAB — IGA: IGA: 80 mg/dL (ref 68–378)

## 2015-04-25 NOTE — Telephone Encounter (Signed)
Per 04/24/15 OV; Patient Instructions       zostrix can be applied 4x daily if pain gets worse as the rash goes away   Keep your appt for your physical.  ---  ATC PT. Line rang numerous times, no answer [facility]

## 2015-04-26 NOTE — Telephone Encounter (Signed)
Spoke with pt,aware of recs.  Nothing further needed.  

## 2015-04-26 NOTE — Telephone Encounter (Signed)
If it's pain anywhere on the right side of her chest it's much more likely due to the shingles.  If it's only with exertion it could be reflux or angina but will need additonal eval - for now would add prilosec 20 bid ac and if starts happening at rest will need to go to er

## 2015-04-26 NOTE — Telephone Encounter (Signed)
Pt returned call  [phone]

## 2015-04-26 NOTE — Telephone Encounter (Signed)
Spoke with pt, states she's been on Valtrex since Sunday d/t Shingles.   Pt states she's having chest pain with exertion-pain is a tightness in chest since Wednesday.   Pt took a Xanax thinking this was anxiety related-alleviated symptoms Xfew hours but came back.   Pt states that according to Valtrex label chest pain is a rare side effect of the med.  Pt requesting recs.  MW please advise.  Thanks!

## 2015-05-01 ENCOUNTER — Telehealth: Payer: Self-pay | Admitting: Internal Medicine

## 2015-05-01 NOTE — Telephone Encounter (Signed)
Spoke with the pt  She states that she continues to have CP since the last visit  It is no worse, and moderately better when she takes advil  She never tried the zotrix cream that MW had rec and will pick up some of this tonight and try it  I went ahead and scheduled her a visit fore tomorrow at 4 pm just in case this does not help and she still needs to be seen  She will call in the am with update  She denies any SOB, arm pain, nausea or other co's  Will go to ED should her pain worsen other other symptoms arise

## 2015-05-02 ENCOUNTER — Ambulatory Visit: Payer: PRIVATE HEALTH INSURANCE | Admitting: Internal Medicine

## 2015-05-15 ENCOUNTER — Ambulatory Visit (HOSPITAL_BASED_OUTPATIENT_CLINIC_OR_DEPARTMENT_OTHER): Payer: PRIVATE HEALTH INSURANCE | Attending: Pulmonary Disease

## 2015-05-15 VITALS — Ht 62.0 in | Wt 221.0 lb

## 2015-05-15 DIAGNOSIS — G4733 Obstructive sleep apnea (adult) (pediatric): Secondary | ICD-10-CM

## 2015-05-15 DIAGNOSIS — G4730 Sleep apnea, unspecified: Secondary | ICD-10-CM

## 2015-05-15 DIAGNOSIS — R0683 Snoring: Secondary | ICD-10-CM | POA: Diagnosis not present

## 2015-05-16 ENCOUNTER — Encounter: Payer: Self-pay | Admitting: Internal Medicine

## 2015-05-16 ENCOUNTER — Other Ambulatory Visit (INDEPENDENT_AMBULATORY_CARE_PROVIDER_SITE_OTHER): Payer: PRIVATE HEALTH INSURANCE

## 2015-05-16 ENCOUNTER — Ambulatory Visit (INDEPENDENT_AMBULATORY_CARE_PROVIDER_SITE_OTHER)
Admission: RE | Admit: 2015-05-16 | Discharge: 2015-05-16 | Disposition: A | Discharge status: Home / Self Care | Payer: PRIVATE HEALTH INSURANCE | Source: Ambulatory Visit | Attending: Internal Medicine | Admitting: Internal Medicine

## 2015-05-16 ENCOUNTER — Ambulatory Visit (INDEPENDENT_AMBULATORY_CARE_PROVIDER_SITE_OTHER): Payer: PRIVATE HEALTH INSURANCE | Admitting: Internal Medicine

## 2015-05-16 VITALS — BP 126/74 | HR 69 | Ht 61.25 in | Wt 221.0 lb

## 2015-05-16 DIAGNOSIS — E7800 Pure hypercholesterolemia, unspecified: Secondary | ICD-10-CM

## 2015-05-16 DIAGNOSIS — B029 Zoster without complications: Secondary | ICD-10-CM

## 2015-05-16 DIAGNOSIS — I10 Essential (primary) hypertension: Secondary | ICD-10-CM

## 2015-05-16 DIAGNOSIS — Z0000 Encounter for general adult medical examination without abnormal findings: Secondary | ICD-10-CM

## 2015-05-16 DIAGNOSIS — M8580 Other specified disorders of bone density and structure, unspecified site: Secondary | ICD-10-CM

## 2015-05-16 LAB — URINALYSIS, ROUTINE W REFLEX MICROSCOPIC
Bilirubin Urine: NEGATIVE
LEUKOCYTES UA: NEGATIVE
Nitrite: NEGATIVE
SPECIFIC GRAVITY, URINE: 1.015 (ref 1.000–1.030)
URINE GLUCOSE: NEGATIVE
UROBILINOGEN UA: 2 — AB (ref 0.0–1.0)
WBC UA: NONE SEEN (ref 0–?)
pH: 7 (ref 5.0–8.0)

## 2015-05-16 LAB — BASIC METABOLIC PANEL
BUN: 13 mg/dL (ref 6–23)
CHLORIDE: 103 meq/L (ref 96–112)
CO2: 31 meq/L (ref 19–32)
Calcium: 9.4 mg/dL (ref 8.4–10.5)
Creatinine, Ser: 0.7 mg/dL (ref 0.40–1.20)
GFR: 88.62 mL/min (ref >60.00)
GLUCOSE: 104 mg/dL — AB (ref 70–99)
POTASSIUM: 4.3 meq/L (ref 3.5–5.1)
Sodium: 141 mEq/L (ref 135–145)

## 2015-05-16 LAB — HEPATIC FUNCTION PANEL
ALBUMIN: 4 g/dL (ref 3.5–5.2)
ALT: 21 U/L (ref 0–35)
AST: 18 U/L (ref 0–37)
Alkaline Phosphatase: 51 U/L (ref 39–117)
Bilirubin, Direct: 0.1 mg/dL (ref 0.0–0.3)
TOTAL PROTEIN: 7 g/dL (ref 6.0–8.3)
Total Bilirubin: 0.3 mg/dL (ref 0.2–1.2)

## 2015-05-16 LAB — CBC WITH DIFFERENTIAL/PLATELET
BASOS ABS: 0 10*3/uL (ref 0.0–0.1)
BASOS PCT: 0.6 % (ref 0.0–3.0)
EOS ABS: 0.1 10*3/uL (ref 0.0–0.7)
Eosinophils Relative: 1.3 % (ref 0.0–5.0)
HCT: 37.3 % (ref 36.0–46.0)
HEMOGLOBIN: 12.4 g/dL (ref 12.0–15.0)
Lymphocytes Relative: 34.5 % (ref 12.0–46.0)
Lymphs Abs: 2.6 10*3/uL (ref 0.7–4.0)
MCHC: 33.2 g/dL (ref 30.0–36.0)
MCV: 90.4 fl (ref 78.0–100.0)
MONO ABS: 0.6 10*3/uL (ref 0.1–1.0)
Monocytes Relative: 7.3 % (ref 3.0–12.0)
Neutro Abs: 4.3 10*3/uL (ref 1.4–7.7)
Neutrophils Relative %: 56.3 % (ref 43.0–77.0)
PLATELETS: 365 10*3/uL (ref 150.0–400.0)
RBC: 4.13 Mil/uL (ref 3.87–5.11)
RDW: 14.2 % (ref 11.5–15.5)
WBC: 7.6 10*3/uL (ref 4.0–10.5)

## 2015-05-16 LAB — LIPID PANEL
Cholesterol: 170 mg/dL (ref 0–200)
HDL: 38.7 mg/dL — ABNORMAL LOW (ref >39.00)
LDL Cholesterol: 104 mg/dL — ABNORMAL HIGH (ref 0–99)
NONHDL: 131.26
Total CHOL/HDL Ratio: 4
Triglycerides: 137 mg/dL (ref 0.0–149.0)
VLDL: 27.4 mg/dL (ref 0.0–40.0)

## 2015-05-16 LAB — TSH: TSH: 1.13 u[IU]/mL (ref 0.35–4.50)

## 2015-05-16 NOTE — Patient Instructions (Signed)
Weight control is simply a matter of calorie balance which needs to be tilted in your favor by eating less and exercising more.  To get the most out of exercise, you need to be continuously aware that you are short of breath, but never out of breath, for 30 minutes daily. As you improve, it will actually be easier for you to do the same amount of exercise  in  30 minutes so always push to the level where you are short of breath.  If this does not result in gradual weight reduction then I strongly recommend you see a nutritionist with a food diary x 2 weeks so that we can work out a negative calorie balance which is universally effective in steady weight loss programs.  Think of your calorie balance like you do your bank account where in this case you want the balance to go down so you must take in less calories than you burn up.  It's just that simple:  Hard to do, but easy to understand.  Good luck!   Please remember to go to the lab and x-ray department downstairs for your tests - we will call you with the results when they are available.  Please schedule a follow up office visit in 6 months, call sooner if needed

## 2015-05-16 NOTE — Progress Notes (Signed)
Quick Note:  Spoke with pt and notified of results per Dr. [name]. Pt verbalized understanding and denied any questions.  ______ 

## 2015-05-16 NOTE — Progress Notes (Signed)
Subjective:    Patient ID: [name], female    DOB: Jul 27, 1946 .   MRN: [id number]   Brief patient profile:  60 yowf never smoker with morbid obesity complicated by gastroesophageal reflux disease, and also a history of obstructive sleep apnea and chronic rhinitis/ hbp.   History of Present Illness  04/27/2014 f/u ov/[name] re: obesity/ ohs/hbp/djd  Chief Complaint  Patient presents with  . Annual Exam    Pt fasting. Overall doing well and denies any co's today.  no reg exercise, very tired in pms  > working out issues of sleep with [name] but he has not reviewed download yet rec Please remember to go to the lab and x-ray department downstairs for your tests - we will call you with the results when they are available. Prevnar today  Dr [name] will be in touch re your bipap settings   04/18/17 tingling R chest at bra strap > rx 04/21/15  04/24/2015 f/u ov/[name] re: shingles/ palp/ hbp Chief Complaint  Patient presents with  . Acute Visit    Pt states went to UC 4 days ago and was dxed with Shingles. She states she was pain is not as severe as it was, but it has become constant. She also c/o "heart beating out of my chest"- relates to stress. She denies any CP.    stress at work, denies ex cp or p > 100 at rest and no worse palp with activity  Onset R burning 8/26 and dx with shingles 8/28 and improving on antiviral rx , only using tylenol with ok pain control  rec zostrix can be applied 4x daily if pain gets worse as the rash goes away    05/16/2015 annual f/u ov/[name] re: mo/hbp/osa/ohs/ fatigue Chief Complaint  Patient presents with  . Annual Exam    Pt fasting today. Pt states overall doing well. She still has some back pain and itching from shingles. No new co's today.    Main issue is fatigue/ too busy to ex  No obvious daytime variabilty or assoc sob or chronic cough or chest tightness, subjective wheeze overt  hb symptoms. No unusual exp hx or h/o childhood pna/ asthma or  knowledge of premature birth.   Sleeping ok on bipap without nocturnal  or early am exacerbation  of respiratory  c/o's or need for noct saba. Also denies any obvious fluctuation of symptoms with weather or environmental changes or other aggravating or alleviating factors except as outlined above  Current Medications, Allergies,Complete  Past Medical History, Past Surgical History, Family History, and Social History were reviewed in [employer] record.  ROS  The following are not active complaints unless bolded sore throat, dysphagia, dental problems, itching, sneezing,   excess/ purulent secretions, ear ache,   fever, chills, sweats, unintended wt loss, pleuritic or exertional cp, hemoptysis,  orthopnea pnd or leg swelling  , presyncope, palpitations, heartburn, abdominal pain, anorexia, nausea, vomiting, diarrhea  or change in bowel or urinary habits, change in stools or urine, dysuria,hematuria,  rash, arthralgias, visual complaints, headache, numbness weakness or ataxia or problems with walking or coordination,  change in mood/affect or memory.                   Past Medical History:  HIATAL HERNIA (ICD-553.3) with GERD  - H/o Barretts esophagus  - EGD pos HH/ stricture 04/18/10 , neg barrett's  RHINITIS, CHRONIC (ICD-472.0)  - Remote allergy testing = molds/dust only  - Sinus CT  09/06/08 mild thickening only  OBSTRUCTIVE SLEEP APNEA (ICD-327.23)..................................................[name]  --PSG 03/31/02 AHI 64  --Nocturnal BIPAP > titration done 05/16/2015  MORBID OBESITY (ICD-278.01)  - Target wt = 158 for BMI < 30  POSITIVE PPD (ICD-795.5)  DIVERTICULOSIS  Shingles R T5/6 03/2015  HEALTH MAINTENANCE........................................................Marland KitchenWert  - DT May 23, 2009  - Pneumovax 04/2013, Prevnar 04/27/2014  - CPX  05/16/2015  - GYN Levoix        Family History:  Father - CAD, ? 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  Never smoker, but Husband smoked / passed [id number] Illicit Drug Use - no  Patient does not get regular exercise.  No etoh         .           Objective:   Physical Exam  wt 224 May 23, 2009 > 226 September 10, 2009 > 229 November 05, 2009 > 232 June 03, 2010 > 224 05/25/2011 >230 01/11/12 > 03/04/2012  225 > 05/17/2012  219 >217 07/29/2012 > 219 09/06/2012 >223 03/16/2013 > 04/11/2013  225 > 05/22/2013  225 > 225 06/23/2013 >  02/27/2014 215 > 04/27/2014  215 >  04/24/2015  222> 05/16/2015   Ambulatory obese  wf in no acute distress.  HEENT: nl dentition, and orophanx. Nl external ear canals without cough reflex, elongated uvula, thick tongue.  Neck without JVD/Nodes/TM  Lungs clear to A and P bilaterally without cough on insp or exp maneuvers  RRR no s3 or murmur or increase in P2.   No sign edema Abd soft and obese but benign with nl excursion in the supine position. No bruits or organomegaly  Ext warm without calf tenderness, cyanosis clubbing  No deformity noted.  Skin classic grouped papular lesions, healing nicely  R T 5/6 distribution MS nl gait, no deformities Neuro alert, pleasant, no motor def      I personally reviewed images and agree with radiology impression as follows:  CXR:  05/16/2015  No change in hyper aeration and mild cardiomegaly. No definite active process.  Labs ordered/ reviewed:    Lab 05/16/15 0932  NA 141  K 4.3  CL 103  CO2 31  BUN 13  CREATININE 0.70  GLUCOSE 104*       Lab 05/16/15 0932  HGB 12.4  HCT 37.3  WBC 7.6  PLT 365.0     Lab Results  Component Value Date   TSH 1.13 05/16/2015              Assessment & Plan:

## 2015-05-20 ENCOUNTER — Ambulatory Visit: Payer: PRIVATE HEALTH INSURANCE | Admitting: Pulmonary Disease

## 2015-05-20 NOTE — Assessment & Plan Note (Signed)
Adequate control on present rx, reviewed > no change in rx needed   

## 2015-05-20 NOTE — Assessment & Plan Note (Signed)
-   Ideal LDL < 130 with hbp and ? Pos fm hx  Lab Results  Component Value Date   CHOL 170 05/16/2015   HDL 38.70* 05/16/2015   LDLCALC 104* 05/16/2015   LDLDIRECT 140.4 05/22/2013   TRIG 137.0 05/16/2015   CHOLHDL 4 05/16/2015    Adequate control on present rx, reviewed > no change in rx needed  = diet

## 2015-05-20 NOTE — Assessment & Plan Note (Signed)
T 5/6 on R onset symptoms 04/19/15 > rx acyclovir x 10 days started 04/21/15

## 2015-05-20 NOTE — Assessment & Plan Note (Signed)
Body mass index is 41.4 so trending down sltly   Lab Results  Component Value Date   TSH 1.13 05/16/2015     Contributing to gerd tendency/ doe/reviewed the need and the process to achieve and maintain neg calorie balance > defer f/u primary care including intermittently monitoring thyroid status

## 2015-05-20 NOTE — Assessment & Plan Note (Signed)
-   Dexa 06/15/11  Spine  0.2  L Fem Neck -1.1,   R Fem Neck -1.8 -  07/05/14  R Fem neck = -1.5   rec repeat in 3 years

## 2015-05-20 NOTE — Assessment & Plan Note (Signed)
-   Prevnar 04/27/2014  - GI screening M Fuller Plan - GYN  [facility] - Mammos neg 10/05/13  > needs to keep up with this per guidelines

## 2015-05-21 DIAGNOSIS — G4733 Obstructive sleep apnea (adult) (pediatric): Secondary | ICD-10-CM | POA: Diagnosis not present

## 2015-05-21 NOTE — Progress Notes (Addendum)
  Patient Name: [name], Cephus Date: 05/15/2015 Gender: Female D.O.B: 25-Dec-1945 Age (years): 17 Referring Provider: Chesley Mires MD,ABSM Height (inches): 62 Interpreting Physician: Chesley Mires MD,ABSM Weight (lbs): 221 RPSGT: Baxter Flattery BMI: 40 MRN: [id number] Neck Size: 16.00  CLINICAL INFORMATION The patient is referred for a BiPAP titration to treat sleep apnea. Date of NPSG, Split Night or HST: 03/31/02  SLEEP STUDY TECHNIQUE As per the AASM Manual for the Scoring of Sleep and Associated Events v2.3 (April 2016) with a hypopnea requiring 4% desaturations. The channels recorded and monitored were frontal, central and occipital EEG, electrooculogram (EOG), submentalis EMG (chin), nasal and oral airflow, thoracic and abdominal wall motion, anterior tibialis EMG, snore microphone, electrocardiogram, and pulse oximetry. Bilevel positive airway pressure (BPAP) was initiated at the beginning of the study and titrated to treat sleep-disordered breathing.  MEDICATIONS Medications administered by patient during sleep study : No sleep medicine administered.  RESPIRATORY PARAMETERS Optimal IPAP Pressure (cm): 12 cm H2O  AHI at Optimal Pressure (/hr) 3.2 Optimal EPAP Pressure (cm): 8 cm H2O Overall Minimal O2 (%): 70.00  Minimal O2 at Optimal Pressure (%): 89.00  SLEEP ARCHITECTURE Start Time: 10:47:36 PM  Stop Time: 4:43:59 AM  Total Time (min): 356.4  Total Sleep Time (min): 349.6  Sleep Latency 2.3  Sleep Efficiency 98.1  REM Latency 158.5  WASO (min): 4.5 (min) Stage N1(%): 1.14  Stage N2(%): 68.39  Stage N3(%): 17.88  Stage R (%): 12.59  Supine (%): 0.72  Arousal Index(/hr): 4.6  CARDIAC DATA The 2 lead EKG demonstrated sinus rhythm. The mean heart rate was 63.50 beats per minute. Other EKG findings include: None.  LEG MOVEMENT DATA The total Periodic Limb Movements of Sleep (PLMS) were 36. The PLMS index was 6.18. A PLMS index of <15 is considered normal in  adults.  IMPRESSIONS She was successfully titrate to BiPAP 12/8 cm H2O. She did not need supplemental oxygen during this titration Study.  DIAGNOSIS Obstructive Sleep Apnea (327.23 [G47.33 ICD-10])  RECOMMENDATIONS Continue BiPAP therapy.   Chesley Mires MD, [place], American Board of Sleep Medicine NPI: [id number] [Electronicallysigned] 05/21/2015 11:51 AM

## 2015-06-03 ENCOUNTER — Encounter: Payer: Self-pay | Admitting: Adult Health

## 2015-06-03 ENCOUNTER — Ambulatory Visit (INDEPENDENT_AMBULATORY_CARE_PROVIDER_SITE_OTHER): Payer: PRIVATE HEALTH INSURANCE | Admitting: Adult Health

## 2015-06-03 VITALS — HR 69 | Ht 61.25 in | Wt 221.0 lb

## 2015-06-03 DIAGNOSIS — G4733 Obstructive sleep apnea (adult) (pediatric): Secondary | ICD-10-CM

## 2015-06-03 NOTE — Progress Notes (Signed)
   Subjective:    Patient ID: [name], female    DOB: Aug 04, 1946, 69 y.o.   MRN: [id number]  HPI 69 yo female with OSA on nocturnal BIPAP   TESTS: PSG 03/31/02 >> AHI 64  BiPAP titration 12/01/07 >> 14/9 cm H2O >> centrals with higher pressures.  ONO with BiPAP and RA 04/21/14 >> test time 8 hrs 23 min. Basal SpO2 93.4%, low SpO2 75%. Spent 31 min with SpO2 < 88%. BiPAP 03/23/14 to 04/21/14 >> used on 30 of 30 nights with average 9 hrs and 30 min. Average AHI is 2.3 with BiPAP 14/9 cm H2O. ONO with BiPAP 11/27/14 >> test time 7 hrs 20 min. Mean SpO2 91.8%, low SpO2 74%. Spent 59 min with SpO2 < 88%.   06/03/2015 Follow up : OSA /titration study results.  Pt returns for follow up for sleep apnea.  Recently underwent a BIPAP titration study after ONO on BIPAP showed desaturations.  She underwent titration study on 9/21/6 that showed successful BIPAP titration on 12/8 cm H2O w/ no desaturations.  She says she is less tired. Says overall she is doing well.  Denies chest pain, orthopnea, edema or fever.   Review of Systems  Constitutional:   No  weight loss, night sweats,  Fevers, chills,  +fatigue, or  lassitude.  HEENT:   No headaches,  Difficulty swallowing,  Tooth/dental problems, or  Sore throat,                No sneezing, itching, ear ache, nasal congestion, post nasal drip,   CV:  No chest pain,  Orthopnea, PND, swelling in lower extremities, anasarca, dizziness, palpitations, syncope.   GI  No heartburn, indigestion, abdominal pain, nausea, vomiting, diarrhea, change in bowel habits, loss of appetite, bloody stools.   Resp: No shortness of breath with exertion or at rest.  No excess mucus, no productive cough,  No non-productive cough,  No coughing up of blood.  No change in color of mucus.  No wheezing.  No chest wall deformity  Skin: no rash or lesions.  GU: no dysuria, change in color of urine, no urgency or frequency.  No flank pain, no hematuria   MS:  No joint  pain or swelling.  No decreased range of motion.  No back pain.  Psych:  No change in mood or affect. No depression or anxiety.  No memory loss.          Objective:   Physical Exam GEN: A/Ox3; pleasant , NAD, obese   HEENT:  [place]/AT,  EACs-clear, TMs-wnl, NOSE-clear, THROAT-clear, no lesions, no postnasal drip or exudate noted. Class 2 MP airway   NECK:  Supple w/ fair ROM; no JVD; normal carotid impulses w/o bruits; no thyromegaly or nodules palpated; no lymphadenopathy.  RESP  Clear  P & A; w/o, wheezes/ rales/ or rhonchi.no accessory muscle use, no dullness to percussion  CARD:  RRR, no m/r/g  , no peripheral edema, pulses intact, no cyanosis or clubbing.  GI:   Soft & nt; nml bowel sounds; no organomegaly or masses detected.  Musco: Warm bil, no deformities or joint swelling noted.   Neuro: alert, no focal deficits noted.    Skin: Warm, no lesions or rashes         Assessment & Plan:

## 2015-06-03 NOTE — Patient Instructions (Addendum)
Continue on BIPAP At bedtime   I will call to discuss titration results .  follow up Dr. [name]  In 6 months and As needed    Late add: new BIPAP setting at BiPAP 12/8 cm H2O  Set up ONO on these settings.

## 2015-06-04 NOTE — Progress Notes (Signed)
Reviewed and agree with assessment/plan. 

## 2015-06-05 ENCOUNTER — Telehealth: Payer: Self-pay | Admitting: Pulmonary Disease

## 2015-06-05 DIAGNOSIS — G4733 Obstructive sleep apnea (adult) (pediatric): Secondary | ICD-10-CM

## 2015-06-05 NOTE — Telephone Encounter (Signed)
Corrected report now in Epic.  She did well with BiPAP 12/8 cm H2O.  She will need to have ONO with BiPAP to determine if she needs supplemental oxygen therapy in addition to BiPAP therapy.  Will have my nurse arrange for ONO with BiPAP.

## 2015-06-06 NOTE — Assessment & Plan Note (Signed)
Severe OSA on BIPAP  Recent ONO with desats , BIPAP titration showed good control w/ no desats at BiPAP 12/8 cm H2O Continue these settings and set up ONO  follow up Dr. [name]  In 6 months and .As needed

## 2015-06-06 NOTE — Progress Notes (Signed)
Chart and office note reviewed in detail  > agree with a/p as outlined    

## 2015-06-06 NOTE — Telephone Encounter (Signed)
Pt is aware of BiPAP results. Order has been placed for ONO with [facility]. Nothing further was needed.

## 2015-06-06 NOTE — Progress Notes (Signed)
Reviewed and agree with assessment/plan. 

## 2015-06-07 ENCOUNTER — Telehealth: Payer: Self-pay | Admitting: Pulmonary Disease

## 2015-06-07 DIAGNOSIS — M19012 Primary osteoarthritis, left shoulder: Secondary | ICD-10-CM | POA: Diagnosis not present

## 2015-06-07 NOTE — Telephone Encounter (Signed)
lmtcb x1 for pt. 

## 2015-06-10 NOTE — Telephone Encounter (Signed)
lmtcb for pt.  

## 2015-06-10 NOTE — Telephone Encounter (Signed)
Pt calling back again [phone]

## 2015-06-10 NOTE — Telephone Encounter (Signed)
lmomtcb x 2  

## 2015-06-11 NOTE — Telephone Encounter (Signed)
Patient said please wait until about 4:30 pm to call her back (dentist appt).  She can be reached at [phone].

## 2015-06-11 NOTE — Telephone Encounter (Signed)
lmtcb for pt.  

## 2015-06-12 ENCOUNTER — Telehealth: Payer: Self-pay | Admitting: Pulmonary Disease

## 2015-06-12 DIAGNOSIS — G4733 Obstructive sleep apnea (adult) (pediatric): Secondary | ICD-10-CM

## 2015-06-12 NOTE — Telephone Encounter (Signed)
Please inform her that she is correct about needing to have pressure setting adjust up during the study.  The final pressure setting reached was 12/8 cm H2O.  Please inform her that I reviewed the study myself, and study results are reliable.

## 2015-06-12 NOTE — Telephone Encounter (Signed)
ATC her cell- "number you are calling in unreachable" [facility]

## 2015-06-12 NOTE — Telephone Encounter (Signed)
Pt called office back today Pt concerned about results of her titration study  She states that technician told her after the study that he had to increase her pressure throughout the night Pt wanted to make sure that Dr [name] was aware of this and wanted to make sure everything was accurate before her ONO Informed pt that i would sent message to Dr [name]  Dr [name], please advise if there are any further rec. Thanks

## 2015-06-12 NOTE — Telephone Encounter (Signed)
Spoke with pt, aware of VS's recs. Pt states her machine is not currently at this setting.  VS please advise if we need to place an order to change pt's settings.  Thanks!

## 2015-06-13 NOTE — Telephone Encounter (Signed)
Please have her DME change her BiPAP setting to 12/8 cm H2O.

## 2015-06-13 NOTE — Telephone Encounter (Signed)
Called and spoke with pt. Reviewed VS's recs. Informed her that we were placing a order to [facility] to change her pressure settings to 12/8 cm H2O. Pt voiced understanding and had no further questions. Order was placed for setting change. Nothing further needed.

## 2015-06-13 NOTE — Telephone Encounter (Signed)
lmtcb for pt.  

## 2015-06-14 NOTE — Telephone Encounter (Signed)
Left detailed msg advising pt to call back if she has any further questions.

## 2015-08-05 DIAGNOSIS — J209 Acute bronchitis, unspecified: Secondary | ICD-10-CM | POA: Diagnosis not present

## 2015-08-05 DIAGNOSIS — J040 Acute laryngitis: Secondary | ICD-10-CM | POA: Diagnosis not present

## 2015-08-06 ENCOUNTER — Telehealth: Payer: Self-pay | Admitting: Pulmonary Disease

## 2015-08-06 NOTE — Telephone Encounter (Signed)
Spoke with pt, aware of results/recs.  Nothing further needed.  

## 2015-08-06 NOTE — Telephone Encounter (Signed)
ONO with BiPAP 07/07/15 >> test time 7hrs 57 min.  Basal SpO2 91.8%, low SpO2 85%.  Spent 7.1 min with SpO2 < 88%.     Will have my nurse inform pt that ONO with BiPAP looked okay.  She does not need supplemental oxygen at night.  She should continue using BiPAP.

## 2015-08-13 ENCOUNTER — Encounter: Payer: Self-pay | Admitting: Pulmonary Disease

## 2015-08-28 ENCOUNTER — Telehealth: Payer: Self-pay | Admitting: Internal Medicine

## 2015-08-28 DIAGNOSIS — N3944 Nocturnal enuresis: Secondary | ICD-10-CM

## 2015-08-28 NOTE — Telephone Encounter (Signed)
LMOMTCB x 1 

## 2015-08-29 NOTE — Telephone Encounter (Signed)
[place] with me/ [facility]

## 2015-08-29 NOTE — Telephone Encounter (Signed)
Spoke with pt. States that she needs to referred to a Urologist. Reports issues with night time incontinence.  MW - please advise if you are okay with referral. Thanks.

## 2015-08-29 NOTE — Telephone Encounter (Signed)
Pt is aware that we will place this referral. Referral has been placed. Nothing further was needed.

## 2015-09-18 DIAGNOSIS — N3941 Urge incontinence: Secondary | ICD-10-CM | POA: Diagnosis not present

## 2015-10-11 DIAGNOSIS — N1330 Unspecified hydronephrosis: Secondary | ICD-10-CM | POA: Diagnosis not present

## 2015-10-11 DIAGNOSIS — R338 Other retention of urine: Secondary | ICD-10-CM | POA: Diagnosis not present

## 2015-10-11 DIAGNOSIS — N3941 Urge incontinence: Secondary | ICD-10-CM | POA: Diagnosis not present

## 2015-10-11 DIAGNOSIS — N3944 Nocturnal enuresis: Secondary | ICD-10-CM | POA: Diagnosis not present

## 2015-10-11 DIAGNOSIS — Z0000 Encounter for general adult medical examination without abnormal findings: Secondary | ICD-10-CM | POA: Diagnosis not present

## 2015-10-14 DIAGNOSIS — F3340 Major depressive disorder, recurrent, in remission, unspecified: Secondary | ICD-10-CM | POA: Diagnosis not present

## 2015-10-18 ENCOUNTER — Telehealth: Payer: Self-pay | Admitting: Internal Medicine

## 2015-10-18 NOTE — Telephone Encounter (Signed)
Yes, def needs referral to Dr Fuller Plan or one of his assoc/ PAs

## 2015-10-18 NOTE — Telephone Encounter (Signed)
Patient is concerned because she has been having bowel movements that are yellow in color.  No pain when having bowel movements. No nausea or vomiting. Patient says that Dr. Melvyn Novas is her PCP, so she thought she should call him first before calling any other doctor.  Patient says that she has a GI specialist that she sees for Diverticulitis, (Dr. Fuller Plan) but she didn't want to call him unless Dr. Melvyn Novas says she should.  Dr. Melvyn Novas, please advise.  Patient Instructions     Weight control is simply a matter of calorie balance which needs to be tilted in your favor by eating less and exercising more. To get the most out of exercise, you need to be continuously aware that you are short of breath, but never out of breath, for 30 minutes daily. As you improve, it will actually be easier for you to do the same amount of exercise in 30 minutes so always push to the level where you are short of breath. If this does not result in gradual weight reduction then I strongly recommend you see a nutritionist with a food diary x 2 weeks so that we can work out a negative calorie balance which is universally effective in steady weight loss programs.  Think of your calorie balance like you do your bank account where in this case you want the balance to go down so you must take in less calories than you burn up. It's just that simple: Hard to do, but easy to understand. Good luck!   Please remember to go to the lab and x-ray department downstairs for your tests - we will call you with the results when they are available.  Please schedule a follow up office visit in 6 months, call sooner if needed

## 2015-10-18 NOTE — Telephone Encounter (Signed)
Left message for patient to call back  

## 2015-10-21 NOTE — Telephone Encounter (Signed)
Left message for patient to call back  

## 2015-10-22 ENCOUNTER — Telehealth: Payer: Self-pay | Admitting: Internal Medicine

## 2015-10-22 NOTE — Telephone Encounter (Signed)
Patient Returned call Pt will try again, she does not want to play phone tag

## 2015-10-22 NOTE — Telephone Encounter (Signed)
Tanda Rockers, MD at 10/18/2015 5:29 PM     Status: Signed       Expand All Collapse All   Yes, def needs referral to Dr Fuller Plan or one of his assoc/ PAs       Called and spoke with pt. Pt states she is calling concerning the phone message that was started on 10/18/15. I reviewed MW's recs from 10/18/15. She states that she is a currently pt of Dr. Fuller Plan and sees him yearly. She asked if she could call and make an appointment without being referred. I explained to her that if she is a current patient of Dr. Fuller Plan she will be able to call his office and get scheduled for an ov. I informed her that if she has any difficult to call our office. She voiced understanding and had no further questions. Nothing further questions.

## 2015-10-22 NOTE — Telephone Encounter (Signed)
lmtcb X3 for pt.  Will close message per triage protocol.  

## 2015-10-31 DIAGNOSIS — N1330 Unspecified hydronephrosis: Secondary | ICD-10-CM | POA: Diagnosis not present

## 2015-10-31 DIAGNOSIS — R338 Other retention of urine: Secondary | ICD-10-CM | POA: Diagnosis not present

## 2015-10-31 DIAGNOSIS — N3941 Urge incontinence: Secondary | ICD-10-CM | POA: Diagnosis not present

## 2015-11-01 DIAGNOSIS — M19012 Primary osteoarthritis, left shoulder: Secondary | ICD-10-CM | POA: Diagnosis not present

## 2015-11-01 DIAGNOSIS — M25512 Pain in left shoulder: Secondary | ICD-10-CM | POA: Diagnosis not present

## 2015-11-08 ENCOUNTER — Ambulatory Visit: Payer: PRIVATE HEALTH INSURANCE | Admitting: Internal Medicine

## 2015-11-13 ENCOUNTER — Ambulatory Visit: Payer: PRIVATE HEALTH INSURANCE | Admitting: Internal Medicine

## 2015-11-15 ENCOUNTER — Other Ambulatory Visit: Payer: Self-pay | Admitting: Internal Medicine

## 2015-11-29 DIAGNOSIS — M25512 Pain in left shoulder: Secondary | ICD-10-CM | POA: Diagnosis not present

## 2015-11-29 DIAGNOSIS — M19012 Primary osteoarthritis, left shoulder: Secondary | ICD-10-CM | POA: Diagnosis not present

## 2015-12-02 ENCOUNTER — Ambulatory Visit: Payer: PRIVATE HEALTH INSURANCE | Admitting: Gastroenterology

## 2015-12-03 DIAGNOSIS — N3941 Urge incontinence: Secondary | ICD-10-CM | POA: Diagnosis not present

## 2015-12-10 ENCOUNTER — Other Ambulatory Visit: Payer: Self-pay | Admitting: Internal Medicine

## 2015-12-24 ENCOUNTER — Telehealth: Payer: Self-pay | Admitting: Internal Medicine

## 2015-12-24 ENCOUNTER — Encounter: Payer: Self-pay | Admitting: Internal Medicine

## 2015-12-24 ENCOUNTER — Ambulatory Visit (INDEPENDENT_AMBULATORY_CARE_PROVIDER_SITE_OTHER)
Admission: RE | Admit: 2015-12-24 | Discharge: 2015-12-24 | Disposition: A | Discharge status: Home / Self Care | Payer: PRIVATE HEALTH INSURANCE | Source: Ambulatory Visit | Attending: Internal Medicine | Admitting: Internal Medicine

## 2015-12-24 ENCOUNTER — Ambulatory Visit (INDEPENDENT_AMBULATORY_CARE_PROVIDER_SITE_OTHER): Payer: PRIVATE HEALTH INSURANCE | Admitting: Internal Medicine

## 2015-12-24 ENCOUNTER — Other Ambulatory Visit (INDEPENDENT_AMBULATORY_CARE_PROVIDER_SITE_OTHER): Payer: PRIVATE HEALTH INSURANCE

## 2015-12-24 VITALS — BP 128/64 | HR 68 | Ht 61.0 in | Wt 213.8 lb

## 2015-12-24 DIAGNOSIS — E7800 Pure hypercholesterolemia, unspecified: Secondary | ICD-10-CM

## 2015-12-24 DIAGNOSIS — Z0000 Encounter for general adult medical examination without abnormal findings: Secondary | ICD-10-CM

## 2015-12-24 DIAGNOSIS — M8580 Other specified disorders of bone density and structure, unspecified site: Secondary | ICD-10-CM

## 2015-12-24 DIAGNOSIS — I10 Essential (primary) hypertension: Secondary | ICD-10-CM | POA: Diagnosis not present

## 2015-12-24 DIAGNOSIS — E559 Vitamin D deficiency, unspecified: Secondary | ICD-10-CM

## 2015-12-24 LAB — BASIC METABOLIC PANEL
BUN: 10 mg/dL (ref 6–23)
CALCIUM: 9.5 mg/dL (ref 8.4–10.5)
CHLORIDE: 102 meq/L (ref 96–112)
CO2: 32 mEq/L (ref 19–32)
CREATININE: 0.65 mg/dL (ref 0.40–1.20)
GFR: 96.36 mL/min (ref >60.00)
Glucose, Bld: 105 mg/dL — ABNORMAL HIGH (ref 70–99)
Potassium: 3.9 mEq/L (ref 3.5–5.1)
SODIUM: 140 meq/L (ref 135–145)

## 2015-12-24 LAB — URINALYSIS
Bilirubin Urine: NEGATIVE
Ketones, ur: NEGATIVE
Leukocytes, UA: NEGATIVE
NITRITE: NEGATIVE
SPECIFIC GRAVITY, URINE: 1.01 (ref 1.000–1.030)
TOTAL PROTEIN, URINE-UPE24: NEGATIVE
URINE GLUCOSE: NEGATIVE
Urobilinogen, UA: 0.2 (ref 0.0–1.0)
pH: 7 (ref 5.0–8.0)

## 2015-12-24 LAB — LIPID PANEL
CHOL/HDL RATIO: 4
CHOLESTEROL: 196 mg/dL (ref 0–200)
HDL: 44.6 mg/dL (ref >39.00)
LDL CALC: 125 mg/dL — AB (ref 0–99)
NonHDL: 151.33
TRIGLYCERIDES: 132 mg/dL (ref 0.0–149.0)
VLDL: 26.4 mg/dL (ref 0.0–40.0)

## 2015-12-24 LAB — CBC WITH DIFFERENTIAL/PLATELET
BASOS ABS: 0.1 10*3/uL (ref 0.0–0.1)
BASOS PCT: 0.7 % (ref 0.0–3.0)
EOS ABS: 0.1 10*3/uL (ref 0.0–0.7)
Eosinophils Relative: 1.5 % (ref 0.0–5.0)
HEMATOCRIT: 39.3 % (ref 36.0–46.0)
HEMOGLOBIN: 13.2 g/dL (ref 12.0–15.0)
LYMPHS PCT: 31.7 % (ref 12.0–46.0)
Lymphs Abs: 2.7 10*3/uL (ref 0.7–4.0)
MCHC: 33.7 g/dL (ref 30.0–36.0)
MCV: 88.7 fl (ref 78.0–100.0)
Monocytes Absolute: 0.6 10*3/uL (ref 0.1–1.0)
Monocytes Relative: 6.8 % (ref 3.0–12.0)
Neutro Abs: 5.1 10*3/uL (ref 1.4–7.7)
Neutrophils Relative %: 59.3 % (ref 43.0–77.0)
Platelets: 379 10*3/uL (ref 150.0–400.0)
RBC: 4.43 Mil/uL (ref 3.87–5.11)
RDW: 14.1 % (ref 11.5–15.5)
WBC: 8.5 10*3/uL (ref 4.0–10.5)

## 2015-12-24 LAB — HEPATIC FUNCTION PANEL
ALT: 20 U/L (ref 0–35)
AST: 17 U/L (ref 0–37)
Albumin: 4.2 g/dL (ref 3.5–5.2)
Alkaline Phosphatase: 56 U/L (ref 39–117)
BILIRUBIN DIRECT: 0.1 mg/dL (ref 0.0–0.3)
BILIRUBIN TOTAL: 0.4 mg/dL (ref 0.2–1.2)
Total Protein: 7 g/dL (ref 6.0–8.3)

## 2015-12-24 LAB — TSH: TSH: 1.01 u[IU]/mL (ref 0.35–4.50)

## 2015-12-24 MED ORDER — VALSARTAN-HYDROCHLOROTHIAZIDE 160-25 MG PO TABS: 1.0000 | ORAL_TABLET | Freq: Every day | ORAL | Status: DC

## 2015-12-24 NOTE — Progress Notes (Signed)
Subjective:    Patient ID: [name], female    DOB: 10-Jul-1946 .   MRN: [id number]   Brief patient profile:  36 yowf never smoker with morbid obesity complicated by gastroesophageal reflux disease, and also a history of obstructive sleep apnea and chronic rhinitis/ hbp.   History of Present Illness  04/27/2014 f/u ov/Azaela Caracci re: obesity/ ohs/hbp/djd  Chief Complaint  Patient presents with  . Annual Exam    Pt fasting. Overall doing well and denies any co's today.  no reg exercise, very tired in pms  > working out issues of sleep with [name] but he has not reviewed download yet rec Please remember to go to the lab and x-ray department downstairs for your tests - we will call you with the results when they are available. Prevnar today  Dr [name] will be in touch re your bipap settings   04/18/17 tingling R chest at bra strap > rx 04/21/15  04/24/2015 f/u ov/Chrishonda Hesch re: shingles/ palp/ hbp Chief Complaint  Patient presents with  . Acute Visit    Pt states went to UC 4 days ago and was dxed with Shingles. She states she was pain is not as severe as it was, but it has become constant. She also c/o "heart beating out of my chest"- relates to stress. She denies any CP.    stress at work, denies ex cp or p > 100 at rest and no worse palp with activity  Onset R burning 8/26 and dx with shingles 8/28 and improving on antiviral rx , only using tylenol with ok pain control  rec zostrix can be applied 4x daily if pain gets worse as the rash goes away    05/16/2015 annual f/u ov/Symantha Steeber re: mo/hbp/osa/ohs/ fatigue Chief Complaint  Patient presents with  . Annual Exam    Pt fasting today. Pt states overall doing well. She still has some back pain and itching from shingles. No new co's today.   Main issue is fatigue/ too busy to ex rec  Cal balance   12/24/2015  f/u ov/Mahari Strahm re: MO/ hbp/ osa/polyuria some better with urology rx  Chief Complaint  Patient presents with  . Follow-up    CPX today-was  having bladder urgency issues seeing Dr. McDermit (on meds. now)Fasting today.  avoiding taking her diovan/hct due to effects on uop  No obvious day to day or daytime variability or assoc chronic cough or cp or chest tightness, subjective wheeze or overt sinus or hb symptoms. No unusual exp hx or h/o childhood pna/ asthma or knowledge of premature birth.  Sleeping ok without nocturnal  or early am exacerbation  of respiratory  c/o's or need for noct saba. Also denies any obvious fluctuation of symptoms with weather or environmental changes or other aggravating or alleviating factors except as outlined above   Current Medications, Allergies, Complete Past Medical History, Past Surgical History, Family History, and Social History were reviewed in [employer] record.  ROS  The following are not active complaints unless bolded sore throat, dysphagia, dental problems, itching, sneezing,  nasal congestion or excess/ purulent secretions, ear ache,   fever, chills, sweats, unintended wt loss, classically pleuritic or exertional cp, hemoptysis,  orthopnea pnd or leg swelling since not taking the hct regularly , presyncope, palpitations, abdominal pain, anorexia, nausea, vomiting, diarrhea  or change in bowel or bladder habits, change in stools or urine, dysuria,hematuria,  rash, arthralgias, visual complaints, headache, numbness, weakness or ataxia or problems with walking or  coordination,  change in mood/affect or memory.                         Past Medical History:  HIATAL HERNIA (ICD-553.3) with GERD  - H/o Barretts esophagus  - EGD pos HH/ stricture 04/18/10 , neg barrett's  RHINITIS, CHRONIC (ICD-472.0)  - Remote allergy testing = molds/dust only  - Sinus CT 09/06/08 mild thickening only  OBSTRUCTIVE SLEEP APNEA (ICD-327.23)..................................................[name]  --PSG 03/31/02 AHI 64  --Nocturnal BIPAP > titration done 05/16/2015  MORBID OBESITY  (ICD-278.01)  - Target wt = 158 for BMI < 30  POSITIVE PPD (ICD-795.5)  DIVERTICULOSIS  Shingles R T5/6 03/2015  HEALTH MAINTENANCE........................................................Marland KitchenWert  - DT May 23, 2009  - Pneumovax 04/2013, Prevnar 04/27/2014  - CPX  12/24/2015  - GYN Levoix        Family History:  Father - CAD, ? 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  Never smoker, but Husband smoked / passed 0000000 Illicit Drug Use - no  Patient does not get regular exercise.  No etoh          Objective:   Physical Exam  wt 224 May 23, 2009 > 226 September 10, 2009 > 229 November 05, 2009 > 232 June 03, 2010 > 224 05/25/2011 >230 01/11/12 > 03/04/2012  225 > 05/17/2012  219 >217 07/29/2012 > 219 09/06/2012 >223 03/16/2013 > 04/11/2013  225 > 05/22/2013  225 > 225 06/23/2013 >  02/27/2014 215 > 04/27/2014  215 >  04/24/2015  222>  12/24/2015   214   Ambulatory obese  wf in no acute distress.  HEENT: nl dentition, and orophanx. Nl external ear canals without cough reflex, elongated uvula, thick tongue.  Neck without JVD/Nodes/TM  Lungs clear to A and P bilaterally without cough on insp or exp maneuvers  RRR no s3 or murmur or increase in P2.  1+ bilateral lower ext sym pitting edema Abd soft and obese but benign with nl excursion in the supine position. No bruits or organomegaly  Ext warm without calf tenderness, cyanosis clubbing  No deformity noted.  Skin classic grouped papular lesions, healing nicely  R T 5/6 distribution MS nl gait, no deformities Neuro alert, pleasant, no motor def    CXR PA and Lateral:   12/24/2015 :    I personally reviewed images and agree with radiology impression  as follows:             CXR PA and Lateral:   12/24/2015 :    I personally reviewed images and agree with radiology impression as follows:   Enlargement of cardiac silhouette. Osseous demineralization.   Labs ordered/ reviewed 12/24/2015 :      Chemistry      Component Value Date/Time   NA 140 12/24/2015 0944   K 3.9 12/24/2015 0944   CL 102 12/24/2015 0944   CO2 32 12/24/2015 0944   BUN 10 12/24/2015 0944   CREATININE 0.65 12/24/2015 0944      Component Value Date/Time   CALCIUM 9.5 12/24/2015 0944   ALKPHOS 56 12/24/2015 0944   AST 17 12/24/2015 0944  ALT 20 12/24/2015 0944   BILITOT 0.4 12/24/2015 0944        Lab Results  Component Value Date   WBC 8.5 12/24/2015   HGB 13.2 12/24/2015   HCT 39.3 12/24/2015   MCV 88.7 12/24/2015   PLT 379.0 12/24/2015       Lab Results  Component Value Date   TSH 1.01 12/24/2015                 Assessment & Plan:

## 2015-12-24 NOTE — Progress Notes (Signed)
Quick Note:  LMTCB ______ 

## 2015-12-24 NOTE — Progress Notes (Signed)
Quick Note:  Pt notified at ov ______

## 2015-12-24 NOTE — Telephone Encounter (Signed)
I spoke with the pt and notified her of her lab and cxr results  Order for DEXA was placed I have mailed her a copy of the labs per her request

## 2015-12-24 NOTE — Patient Instructions (Signed)
Weight control is simply a matter of calorie balance which needs to be tilted in your favor by eating less and exercising more.  To get the most out of exercise, you need to be continuously aware that you are short of breath, but never out of breath, for 30 minutes daily. As you improve, it will actually be easier for you to do the same amount of exercise  in  30 minutes so always push to the level where you are short of breath.  If this does not result in gradual weight reduction then I strongly recommend you see a nutritionist with a food diary x 2 weeks so that we can work out a negative calorie balance which is universally effective in steady weight loss programs.  Think of your calorie balance like you do your bank account where in this case you want the balance to go down so you must take in less calories than you burn up.  It's just that simple:  Hard to do, but easy to understand.  Good luck!   Please remember to go to the lab and x-ray department downstairs for your tests - we will call you with the results when they are available.     Please schedule a follow up visit in 6 months but call sooner if needed  

## 2015-12-25 ENCOUNTER — Encounter: Payer: Self-pay | Admitting: Internal Medicine

## 2015-12-25 NOTE — Assessment & Plan Note (Signed)
-   Prevnar 04/27/2014  - GI screening M Fuller Plan - GYN  [facility] - Mammos neg 10/05/13   Up to date including mammos from [place]/ requested

## 2015-12-25 NOTE — Assessment & Plan Note (Signed)
-   Dexa 06/15/11  Spine  0.2  L Fem Neck -1.1,   R Fem Neck -1.8 -  07/05/14  R Fem neck = -1.5   rec repeat 08/04/16   Calcium/ vit d/ wt bearing ex reviewed

## 2015-12-25 NOTE — Assessment & Plan Note (Signed)
-   Ideal LDL < 130 with hbp and ? Pos fm hx  Lab Results  Component Value Date   CHOL 196 12/24/2015   HDL 44.60 12/24/2015   LDLCALC 125* 12/24/2015   LDLDIRECT 140.4 05/22/2013   TRIG 132.0 12/24/2015   CHOLHDL 4 12/24/2015    Adequate control on present rx, reviewed > no change in rx needed  = diet/ ex only

## 2015-12-25 NOTE — Assessment & Plan Note (Signed)
Adequate control on present rx, reviewed > no change in rx needed > advised we can separate the diovan and hct but needs to take both

## 2015-12-25 NOTE — Assessment & Plan Note (Signed)
-   Target wt 158 for bmi < 30   Body mass index is 40.42    Lab Results  Component Value Date   TSH 1.01 12/24/2015     Contributing to gerd tendency/ doe/reviewed the need and the process to achieve and maintain neg calorie balance

## 2015-12-26 ENCOUNTER — Telehealth: Payer: Self-pay | Admitting: Internal Medicine

## 2015-12-26 NOTE — Telephone Encounter (Signed)
I have scheduled pt for Bone Density test at [facility] on 5/15 at 3:00. I have left pt vm to call me back for appt info.

## 2015-12-26 NOTE — Telephone Encounter (Signed)
Pt returned call and I gave her appointment info and she was fine with it.[name]

## 2015-12-30 DIAGNOSIS — F3340 Major depressive disorder, recurrent, in remission, unspecified: Secondary | ICD-10-CM | POA: Diagnosis not present

## 2016-01-08 DIAGNOSIS — N3941 Urge incontinence: Secondary | ICD-10-CM | POA: Diagnosis not present

## 2016-01-08 DIAGNOSIS — R338 Other retention of urine: Secondary | ICD-10-CM | POA: Diagnosis not present

## 2016-01-08 DIAGNOSIS — Z0000 Encounter for general adult medical examination without abnormal findings: Secondary | ICD-10-CM | POA: Diagnosis not present

## 2016-01-10 ENCOUNTER — Telehealth: Payer: Self-pay | Admitting: Internal Medicine

## 2016-01-10 NOTE — Telephone Encounter (Signed)
DEXA reviewed by Dr Melvyn Novas- result shows no change when compared to previous study [facility] with results

## 2016-01-17 ENCOUNTER — Encounter: Payer: Self-pay | Admitting: Internal Medicine

## 2016-01-27 ENCOUNTER — Telehealth: Payer: Self-pay | Admitting: Gastroenterology

## 2016-01-27 ENCOUNTER — Ambulatory Visit: Payer: PRIVATE HEALTH INSURANCE | Admitting: Gastroenterology

## 2016-01-27 NOTE — Telephone Encounter (Signed)
No charge this time. 

## 2016-01-27 NOTE — Telephone Encounter (Signed)
Do you want to charge? 

## 2016-01-31 ENCOUNTER — Ambulatory Visit: Payer: PRIVATE HEALTH INSURANCE | Admitting: Psychology

## 2016-02-06 ENCOUNTER — Ambulatory Visit (INDEPENDENT_AMBULATORY_CARE_PROVIDER_SITE_OTHER): Payer: PRIVATE HEALTH INSURANCE | Admitting: Psychology

## 2016-02-06 DIAGNOSIS — F341 Dysthymic disorder: Secondary | ICD-10-CM | POA: Diagnosis not present

## 2016-02-17 ENCOUNTER — Other Ambulatory Visit: Payer: Self-pay | Admitting: Internal Medicine

## 2016-02-17 MED ORDER — FLUTICASONE PROPIONATE 50 MCG/ACT NA SUSP
2.0000 | Freq: Every day | NASAL | Status: DC
Start: 2016-02-17 — End: 2017-08-09

## 2016-02-18 DIAGNOSIS — N3944 Nocturnal enuresis: Secondary | ICD-10-CM | POA: Diagnosis not present

## 2016-02-18 DIAGNOSIS — N3941 Urge incontinence: Secondary | ICD-10-CM | POA: Diagnosis not present

## 2016-03-02 ENCOUNTER — Encounter: Payer: Self-pay | Admitting: Internal Medicine

## 2016-03-02 ENCOUNTER — Ambulatory Visit (INDEPENDENT_AMBULATORY_CARE_PROVIDER_SITE_OTHER): Payer: PRIVATE HEALTH INSURANCE | Admitting: Internal Medicine

## 2016-03-02 ENCOUNTER — Other Ambulatory Visit (INDEPENDENT_AMBULATORY_CARE_PROVIDER_SITE_OTHER): Payer: PRIVATE HEALTH INSURANCE

## 2016-03-02 VITALS — BP 106/74 | HR 73 | Temp 97.6°F | Ht 62.0 in | Wt 213.6 lb

## 2016-03-02 DIAGNOSIS — R109 Unspecified abdominal pain: Secondary | ICD-10-CM | POA: Diagnosis not present

## 2016-03-02 LAB — CBC WITH DIFFERENTIAL/PLATELET
Basophils Absolute: 0.1 10*3/uL (ref 0.0–0.1)
Basophils Relative: 0.6 % (ref 0.0–3.0)
EOS PCT: 1.1 % (ref 0.0–5.0)
Eosinophils Absolute: 0.1 10*3/uL (ref 0.0–0.7)
HCT: 38.2 % (ref 36.0–46.0)
Hemoglobin: 12.7 g/dL (ref 12.0–15.0)
LYMPHS ABS: 3 10*3/uL (ref 0.7–4.0)
Lymphocytes Relative: 33.8 % (ref 12.0–46.0)
MCHC: 33.3 g/dL (ref 30.0–36.0)
MCV: 89.9 fl (ref 78.0–100.0)
MONO ABS: 0.7 10*3/uL (ref 0.1–1.0)
Monocytes Relative: 8.5 % (ref 3.0–12.0)
NEUTROS ABS: 4.9 10*3/uL (ref 1.4–7.7)
NEUTROS PCT: 56 % (ref 43.0–77.0)
Platelets: 402 10*3/uL — ABNORMAL HIGH (ref 150.0–400.0)
RBC: 4.25 Mil/uL (ref 3.87–5.11)
RDW: 13.9 % (ref 11.5–15.5)
WBC: 8.8 10*3/uL (ref 4.0–10.5)

## 2016-03-02 LAB — HEPATIC FUNCTION PANEL
ALBUMIN: 4.1 g/dL (ref 3.5–5.2)
ALK PHOS: 57 U/L (ref 39–117)
ALT: 21 U/L (ref 0–35)
AST: 17 U/L (ref 0–37)
Bilirubin, Direct: 0.1 mg/dL (ref 0.0–0.3)
TOTAL PROTEIN: 7.1 g/dL (ref 6.0–8.3)
Total Bilirubin: 0.3 mg/dL (ref 0.2–1.2)

## 2016-03-02 LAB — LIPASE: LIPASE: 13 U/L (ref 11.0–59.0)

## 2016-03-02 LAB — URINALYSIS, ROUTINE W REFLEX MICROSCOPIC
Bilirubin Urine: NEGATIVE
KETONES UR: NEGATIVE
LEUKOCYTES UA: NEGATIVE
Nitrite: NEGATIVE
PH: 6 (ref 5.0–8.0)
SPECIFIC GRAVITY, URINE: 1.02 (ref 1.000–1.030)
Total Protein, Urine: NEGATIVE
Urine Glucose: NEGATIVE
Urobilinogen, UA: 0.2 (ref 0.0–1.0)

## 2016-03-02 NOTE — Progress Notes (Addendum)
Subjective:    Patient ID: [name], female    DOB: 1945-09-12 .   MRN: [id number]   Brief patient profile:  77 yowf never smoker with morbid obesity complicated by gastroesophageal reflux disease, and also a history of obstructive sleep apnea and chronic rhinitis/ hbp.   History of Present Illness  04/27/2014 f/u ov/[name] re: obesity/ ohs/hbp/djd  Chief Complaint  Patient presents with  . Annual Exam    Pt fasting. Overall doing well and denies any co's today.  no reg exercise, very tired in pms  > working out issues of sleep with [name] but he has not reviewed download yet rec Please remember to go to the lab and x-ray department downstairs for your tests - we will call you with the results when they are available. Prevnar today  Dr [name] will be in touch re your bipap settings   04/19/15 tingling R chest at bra strap > rx 04/21/15  04/24/2015 f/u ov/[name] re: shingles/ palp/ hbp Chief Complaint  Patient presents with  . Acute Visit    Pt states went to UC 4 days ago and was dxed with Shingles. She states she was pain is not as severe as it was, but it has become constant. She also c/o "heart beating out of my chest"- relates to stress. She denies any CP.    stress at work, denies ex cp or p > 100 at rest and no worse palp with activity  Onset R burning 8/26 and dx with shingles 8/28 and improving on antiviral rx , only using tylenol with ok pain control  rec zostrix can be applied 4x daily if pain gets worse as the rash goes away    05/16/2015 annual f/u ov/[name] re: mo/hbp/osa/ohs/ fatigue Chief Complaint  Patient presents with  . Annual Exam    Pt fasting today. Pt states overall doing well. She still has some back pain and itching from shingles. No new co's today.   Main issue is fatigue/ too busy to ex rec  Cal balance   12/24/2015  f/u ov/[name] re: MO/ hbp/ osa/polyuria some better with urology rx  Chief Complaint  Patient presents with  . Follow-up    CPX today-was  having bladder urgency issues seeing Dr. McDermit (on meds. now)Fasting today.  avoiding taking her diovan/hct due to effects on uop rec No change rx   03/02/2016 acute extended ov/[name] re: abd pain / constipation/ h/o tics Chief Complaint  Patient presents with  . Acute Visit    Pt c/o bloating and abd pain off and on for the past 5 days. She also has had loose stools, flushing and chills.    acute onset periumbilical discomfort x 5 d prior to OV   came on over an hour and present since then continuously, not crampy,  but better p bm no nausea or loss of appetite better supine /    No obvious day to day or daytime variability or assoc chronic cough or cp or chest tightness, subjective wheeze or overt sinus or hb symptoms. No unusual exp hx or h/o childhood pna/ asthma or knowledge of premature birth.  Sleeping ok without nocturnal  or early am exacerbation  of respiratory  c/o's or need for noct saba. Also denies any obvious fluctuation of symptoms with weather or environmental changes or other aggravating or alleviating factors except as outlined above   Current Medications, Allergies, Complete Past Medical History, Past Surgical History, Family History, and Social History were reviewed in  [facility] electronic medical record.  ROS  The following are not active complaints unless bolded sore throat, dysphagia, dental problems, itching, sneezing,  nasal congestion or excess/ purulent secretions, ear ache,   fever, chills, sweats, unintended wt loss, classically pleuritic or exertional cp, hemoptysis,  orthopnea pnd or leg swelling better , presyncope, palpitations, abdominal pain, anorexia, nausea, vomiting, diarrhea  or change in bowel or bladder habits, change in stools or urine, dysuria,hematuria,  rash, arthralgias, visual complaints, headache, numbness, weakness or ataxia or problems with walking or coordination,  change in mood/affect or memory.                         Past  Medical History:  HIATAL HERNIA (ICD-553.3) with GERD  - H/o Barretts esophagus  - EGD pos HH/ stricture 04/18/10 , neg barrett's  RHINITIS, CHRONIC (ICD-472.0)  - Remote allergy testing = molds/dust only  - Sinus CT 09/06/08 mild thickening only  OBSTRUCTIVE SLEEP APNEA (ICD-327.23)..................................................[name]  --PSG 03/31/02 AHI 64  --Nocturnal BIPAP > titration done 05/16/2015  MORBID OBESITY (ICD-278.01)  - Target wt = 158 for BMI < 30  POSITIVE PPD (ICD-795.5)  DIVERTICULOSIS  Shingles R T5/6 03/2015  HEALTH MAINTENANCE........................................................Marland KitchenWert  - DT May 23, 2009  - Pneumovax 04/2013, Prevnar 04/27/2014  - CPX  12/24/2015  - GYN Levoix        Family History:  Father - CAD, ? 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  No etoh          Objective:   Physical Exam  wt 224 May 23, 2009 > 226 September 10, 2009 > 229 November 05, 2009 > 232 June 03, 2010 > 224 05/25/2011 >230 01/11/12 > 03/04/2012  225 > 05/17/2012  219 >217 07/29/2012 > 219 09/06/2012 >223 03/16/2013 > 04/11/2013  225 > 05/22/2013  225 > 225 06/23/2013 >  02/27/2014 215 > 04/27/2014  215 >  04/24/2015  222>  12/24/2015   214 >  03/02/2016  214  Ambulatory obese  wf in no acute distress.  HEENT: nl dentition, and orophanx. Nl external ear canals without cough reflex, elongated uvula, thick tongue.  Neck without JVD/Nodes/TM  Lungs clear to A and P bilaterally without cough on insp or exp maneuvers  RRR no s3 or murmur or increase in P2.  Trace  bilateral  lower ext sym pitting edema Abd soft and obese but benign with nl excursion in the supine position. No bruits or organomegaly or tenderness, localized or otherwise and no rebound Ext warm without calf tenderness, cyanosis clubbing  No deformity noted.  Skin no rash  MS nl gait, no deformities Neuro alert, pleasant, no motor def     Labs ordered/ reviewed:   Lab Results  Component Value Date   WBC 8.8 03/02/2016   HGB 12.7 03/02/2016   HCT 38.2 03/02/2016   MCV 89.9 03/02/2016   PLT 402.0* 03/02/2016    U/a neg  Assessment & Plan:

## 2016-03-02 NOTE — Patient Instructions (Addendum)
Classic subdiaphragmatic pain pattern suggests ibs:  Stereotypical,  with a very limited distribution of pain locations, daytime, not exacerbated by ex or coughing, worse in sitting position, associated with generalized abd bloating, not present supine due to the dome effect of the diaphragm is  canceled in that position. Frequently these patients have had multiple negative GI workups and CT scans.  Treatment consists of avoiding foods that cause gas (especially [place] food, boiled eggs/ beans and raw vegetables like spinach and salads)  and citrucel 1 heaping tsp twice daily with a large glass of water.  Pain should improve w/in 2 weeks and if not then consider further GI work up.     Please remember to go to the   Lab department downstairs for your tests - we will call you with the results when they are available.     Keep follow up with Dr Fuller Plan and call for sooner appt if not getting better, to ER if worse on above rx

## 2016-03-03 NOTE — Progress Notes (Signed)
Quick Note:  Left pt detailed msg ______

## 2016-03-05 NOTE — Assessment & Plan Note (Signed)
Exam is benign and no nausea, fever, leukocytosis, felt better p bm so likely just ibs and not diverticulitis   rec rx for ibs and f/u with DR Fuller Plan, to er if worse for abd ct  I had an extended discussion with the patient reviewing all relevant studies completed to date and  lasting 15 to 20 minutes of a 25 minute visit    Each maintenance medication was reviewed in detail including most importantly the difference between maintenance and prns and under what circumstances the prns are to be triggered using an action plan format that is not reflected in the computer generated alphabetically organized AVS.    Please see instructions for details which were reviewed in writing and the patient given a copy highlighting the part that I personally wrote and discussed at today's ov.

## 2016-03-17 ENCOUNTER — Ambulatory Visit: Payer: PRIVATE HEALTH INSURANCE | Admitting: Pulmonary Disease

## 2016-03-23 ENCOUNTER — Ambulatory Visit (INDEPENDENT_AMBULATORY_CARE_PROVIDER_SITE_OTHER): Payer: PRIVATE HEALTH INSURANCE | Admitting: Gastroenterology

## 2016-03-23 ENCOUNTER — Encounter: Payer: Self-pay | Admitting: Gastroenterology

## 2016-03-23 VITALS — BP 122/60 | HR 76 | Ht 61.25 in | Wt 218.0 lb

## 2016-03-23 DIAGNOSIS — K589 Irritable bowel syndrome without diarrhea: Secondary | ICD-10-CM

## 2016-03-23 DIAGNOSIS — K219 Gastro-esophageal reflux disease without esophagitis: Secondary | ICD-10-CM | POA: Diagnosis not present

## 2016-03-23 MED ORDER — DICYCLOMINE HCL 10 MG PO CAPS: 10.0000 mg | ORAL_CAPSULE | Freq: Three times a day (TID) | ORAL | 11 refills | Status: DC

## 2016-03-23 MED ORDER — PANTOPRAZOLE SODIUM 40 MG PO TBEC: DELAYED_RELEASE_TABLET | ORAL | 11 refills | Status: DC

## 2016-03-23 NOTE — Progress Notes (Signed)
    History of Present Illness: This is a 70 year old female with diarrhea and GERD. Her reflux symptoms are very well controlled on pantoprazole 40 mg twice daily. Occasionally she takes it Pepcid 20 mg at bedtime when her GERD or respiratory symtoms are active. She has persistent problems with urgent loose watery stools following meals. Occasionally they will occur within 5 minutes of the liver associated with significant abdominal cramping.  Current Medications, Allergies, Past Medical History, Past Surgical History, Family History and Social History were reviewed in [employer] record.  Physical Exam: General: Well developed, well nourished, no acute distress Head: Normocephalic and atraumatic Eyes:  sclerae anicteric, EOMI Ears: Normal auditory acuity Mouth: No deformity or lesions Lungs: Clear throughout to auscultation Heart: Regular rate and rhythm; no murmurs, rubs or bruits Abdomen: Soft, non tender and non distended. No masses, hepatosplenomegaly or hernias noted. Normal Bowel sounds Musculoskeletal: Symmetrical with no gross deformities  Pulses:  Normal pulses noted Extremities: No clubbing, cyanosis, edema or deformities noted Neurological: Alert oriented x 4, grossly nonfocal Psychological:  Alert and cooperative. Normal mood and affect  Assessment and Recommendations:  1. IBS-D. avoid foods that trigger symptoms. Begin dicyclomine 10 mg 3 times a day taken before meals. Advsied her to call office if her symptoms are not adequately controlled-consider increasing dosage or trying another anti-spasmodic. Consider follow-up diet if above is not adequate.  2. GERD. Continue pantoprazole 40 mg twice daily. Pepcid 20 mg at bedtime when GERD or respiratory symptoms are active. Follow standard antireflux measures.

## 2016-03-23 NOTE — Patient Instructions (Signed)
We have sent the following medications to your pharmacy for you to pick up at your convenience: Pantoprazole and Bentyl.  Call our office back if your IBS symptoms have not improved.   Normal BMI (Body Mass Index- based on height and weight) is between 23 and 30. Your BMI today is Body mass index is 40.86 kg/m. [name] Please consider follow up  regarding your BMI with your Primary Care Provider.  Thank you for choosing me and [facility].  Pricilla Riffle. Dagoberto Ligas., MD., [facility] Regal

## 2016-03-27 ENCOUNTER — Telehealth: Payer: Self-pay | Admitting: Internal Medicine

## 2016-03-27 DIAGNOSIS — M7989 Other specified soft tissue disorders: Secondary | ICD-10-CM

## 2016-03-27 NOTE — Telephone Encounter (Signed)
MW please advise if ok to send in an order for pt to have [place] study on her legs due to venous insufficiency.  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  Please advise. thanks

## 2016-03-28 DIAGNOSIS — M19012 Primary osteoarthritis, left shoulder: Secondary | ICD-10-CM | POA: Diagnosis not present

## 2016-03-28 DIAGNOSIS — M25512 Pain in left shoulder: Secondary | ICD-10-CM | POA: Diagnosis not present

## 2016-03-29 ENCOUNTER — Encounter: Payer: Self-pay | Admitting: Internal Medicine

## 2016-03-29 DIAGNOSIS — M7989 Other specified soft tissue disorders: Secondary | ICD-10-CM | POA: Insufficient documentation

## 2016-03-29 NOTE — Telephone Encounter (Signed)
Fine with me - order under dx of leg swelling

## 2016-03-30 NOTE — Telephone Encounter (Signed)
Orders placed and nothing further is needed.  

## 2016-03-31 DIAGNOSIS — L918 Other hypertrophic disorders of the skin: Secondary | ICD-10-CM | POA: Diagnosis not present

## 2016-03-31 DIAGNOSIS — L853 Xerosis cutis: Secondary | ICD-10-CM | POA: Diagnosis not present

## 2016-03-31 DIAGNOSIS — L820 Inflamed seborrheic keratosis: Secondary | ICD-10-CM | POA: Diagnosis not present

## 2016-04-13 ENCOUNTER — Ambulatory Visit: Payer: PRIVATE HEALTH INSURANCE | Admitting: Pulmonary Disease

## 2016-04-16 ENCOUNTER — Telehealth: Payer: Self-pay

## 2016-04-16 NOTE — Telephone Encounter (Signed)
lmtcb X1 for pt to bring SD card to office visit tomorrow from bipap.

## 2016-04-17 ENCOUNTER — Ambulatory Visit (INDEPENDENT_AMBULATORY_CARE_PROVIDER_SITE_OTHER): Payer: PRIVATE HEALTH INSURANCE | Admitting: Pulmonary Disease

## 2016-04-17 ENCOUNTER — Encounter: Payer: Self-pay | Admitting: Pulmonary Disease

## 2016-04-17 VITALS — BP 118/84 | HR 76 | Ht 62.0 in | Wt 213.0 lb

## 2016-04-17 DIAGNOSIS — G4733 Obstructive sleep apnea (adult) (pediatric): Secondary | ICD-10-CM | POA: Diagnosis not present

## 2016-04-17 NOTE — Patient Instructions (Signed)
Will arrange for new BiPAP machine and mask  Will get copy of BiPAP report from new machine  Follow up in 1 year

## 2016-04-17 NOTE — Telephone Encounter (Signed)
appt is today.  Will close encounter.

## 2016-04-17 NOTE — Progress Notes (Signed)
Current Outpatient Prescriptions on File Prior to Visit  Medication Sig  . acetaminophen (TYLENOL) 325 MG tablet Take 650 mg by mouth every 6 (six) hours as needed. For pain  . ALPRAZolam (XANAX) 0.25 MG tablet Take 0.125 mg by mouth 3 (three) times daily as needed. For anxiety  . Ascorbic Acid (VITAMIN C) 500 MG tablet Take 500 mg by mouth daily.    [name] b complex vitamins tablet Take 1 tablet by mouth daily.   [name] buPROPion (WELLBUTRIN XL) 300 MG 24 hr tablet Take 300 mg by mouth daily.  . Calcium Carbonate-Vitamin D 600-400 MG-UNIT per tablet Take 1 tablet by mouth daily.    [name] dicyclomine (BENTYL) 10 MG capsule Take 1 capsule (10 mg total) by mouth 3 (three) times daily before meals.  . DULoxetine (CYMBALTA) 60 MG capsule Take 60 mg by mouth daily.  . famotidine (PEPCID) 20 MG tablet Take 20 mg by mouth at bedtime as needed. For allergies  . fluticasone (FLONASE) 50 MCG/ACT nasal spray Place 2 sprays into both nostrils daily.  . mirabegron ER (MYRBETRIQ) 25 MG TB24 tablet Take 25 mg by mouth daily.  . naproxen (NAPROSYN) 500 MG tablet Take 500 mg by mouth 2 (two) times daily with a meal. As needed  . pantoprazole (PROTONIX) 40 MG tablet TAKE 1 TABLET BY MOUTH TWICE DAILY. TAKE1 TABLET EVERY 30 TO 60 MINUTES BEFORE FIRST AND LAST MEALS OF THE DAY  . valsartan-hydrochlorothiazide (DIOVAN-HCT) 160-25 MG tablet Take 1 tablet by mouth daily.   No current facility-administered medications on file prior to visit.      Chief Complaint  Patient presents with  . Sleep Apnea    Currently using CPAP every night. DME is [facility]. Would like a new machine.     Sleep tests PSG 03/31/02 >> AHI 64 BiPAP titration 12/01/07 >> BiPAP 14/10 cm H2O >> centrals with higher pressures Pressure change 06/12/15 >> BiPAP 12/8 cm H2O ONO with BiPAP 07/07/15 >> test time 7hrs 57 min.  Basal SpO2 91.8%, low SpO2 85%.  Spent 7.1 min with SpO2 < 88%.    Past medical hx Allergies, Anxiety, Depression, GERD, Barrett's  Esophagus, Hiatal Hernia, HTN, Osteoporosis, PPD positive  Past surgical hx, Allergies, Family hx, Social hx all reviewed.  Vital Signs BP 118/84 (BP Location: Left Arm, Cuff Size: Normal)   Pulse 76   Ht 5\' 2"  (1.575 m)   Wt 213 lb (96.6 kg)   SpO2 95%   BMI 38.96 kg/m   History of Present Illness [name] is a 70 y.o. female with OSA/OHS.  She uses BiPAP nightly.  No issues with mask or pressure setting.  He device is more than 68yrs old.  Physical Exam  General - No distress ENT - No sinus tenderness, no oral exudate, no LAN Cardiac - s1s2 regular, no murmur Chest - No wheeze/rales/dullness Back - No focal tenderness Abd - Soft, non-tender Ext - No edema Neuro - Normal strength Skin - No rashes Psych - normal mood, and behavior   Assessment/Plan  Obstructive sleep apnea. - continue BiPAP 12/8 cm H2O - will arrange for new mask and machine >> will get download from new machine   Patient Instructions  Will arrange for new BiPAP machine and mask  Will get copy of BiPAP report from new machine  Follow up in 1 year   Chesley Mires, MD [facility] Pager:  [phone] 04/17/2016, 4:15 PM

## 2016-04-20 ENCOUNTER — Encounter: Payer: Self-pay | Admitting: Internal Medicine

## 2016-04-20 DIAGNOSIS — M7989 Other specified soft tissue disorders: Secondary | ICD-10-CM | POA: Diagnosis not present

## 2016-04-23 ENCOUNTER — Encounter: Payer: Self-pay | Admitting: Pulmonary Disease

## 2016-05-12 ENCOUNTER — Telehealth: Payer: Self-pay | Admitting: Pulmonary Disease

## 2016-05-12 NOTE — Telephone Encounter (Signed)
Spoke with pt. States that she is having issues with her insurance. She will not be getting her BiPAP until she gets these issues addressed. Will route message to VS to make him aware.

## 2016-05-13 NOTE — Telephone Encounter (Signed)
Please have Cameron look into what issue is with insurance.

## 2016-05-13 NOTE — Telephone Encounter (Signed)
[facility] can you look into why the pt is having the issues with her insurance and getting her BIPAP per VS.  thanks

## 2016-05-15 NOTE — Telephone Encounter (Signed)
Spoke with [facility] and she said that pt has placed order for new BiPAP on hold as she is changing insurances and wants to wait until her new insurance starts. Nothing further needed at this time.

## 2016-05-18 ENCOUNTER — Telehealth: Payer: Self-pay | Admitting: *Deleted

## 2016-05-18 NOTE — Telephone Encounter (Signed)
Will send to Dr [name] as Juluis Rainier.

## 2016-05-18 NOTE — Telephone Encounter (Signed)
-----   Message from [facility] sent at 05/01/2016 11:40 AM EDT ----- Good morning! FYI, this  patient is unable to proceed with BIPAP. She is planning on contacting her insurance and she will call us back should she decided to proceed.  Wanted to let you know of delay. Thanks! Melissa

## 2016-05-19 NOTE — Telephone Encounter (Signed)
Noted  

## 2016-05-25 DIAGNOSIS — M25512 Pain in left shoulder: Secondary | ICD-10-CM | POA: Diagnosis not present

## 2016-05-25 DIAGNOSIS — M19012 Primary osteoarthritis, left shoulder: Secondary | ICD-10-CM | POA: Diagnosis not present

## 2016-06-09 DIAGNOSIS — L570 Actinic keratosis: Secondary | ICD-10-CM | POA: Diagnosis not present

## 2016-06-23 ENCOUNTER — Ambulatory Visit: Payer: PRIVATE HEALTH INSURANCE | Admitting: Pulmonary Disease

## 2016-06-26 ENCOUNTER — Ambulatory Visit: Payer: PRIVATE HEALTH INSURANCE | Admitting: Internal Medicine

## 2016-06-29 DIAGNOSIS — M65342 Trigger finger, left ring finger: Secondary | ICD-10-CM | POA: Diagnosis not present

## 2016-07-13 ENCOUNTER — Ambulatory Visit: Payer: PRIVATE HEALTH INSURANCE | Admitting: Internal Medicine

## 2016-07-20 ENCOUNTER — Ambulatory Visit (INDEPENDENT_AMBULATORY_CARE_PROVIDER_SITE_OTHER): Payer: PRIVATE HEALTH INSURANCE | Admitting: Internal Medicine

## 2016-07-20 ENCOUNTER — Ambulatory Visit (INDEPENDENT_AMBULATORY_CARE_PROVIDER_SITE_OTHER)
Admission: RE | Admit: 2016-07-20 | Discharge: 2016-07-20 | Disposition: A | Discharge status: Home / Self Care | Payer: PRIVATE HEALTH INSURANCE | Source: Ambulatory Visit | Attending: Internal Medicine | Admitting: Internal Medicine

## 2016-07-20 ENCOUNTER — Other Ambulatory Visit (INDEPENDENT_AMBULATORY_CARE_PROVIDER_SITE_OTHER): Payer: PRIVATE HEALTH INSURANCE

## 2016-07-20 ENCOUNTER — Encounter: Payer: Self-pay | Admitting: Internal Medicine

## 2016-07-20 VITALS — BP 126/68 | HR 71 | Ht 62.0 in | Wt 220.0 lb

## 2016-07-20 DIAGNOSIS — I10 Essential (primary) hypertension: Secondary | ICD-10-CM | POA: Diagnosis not present

## 2016-07-20 DIAGNOSIS — R0789 Other chest pain: Secondary | ICD-10-CM

## 2016-07-20 DIAGNOSIS — R079 Chest pain, unspecified: Secondary | ICD-10-CM | POA: Insufficient documentation

## 2016-07-20 LAB — URINALYSIS
BILIRUBIN URINE: NEGATIVE
HGB URINE DIPSTICK: NEGATIVE
Ketones, ur: NEGATIVE
LEUKOCYTES UA: NEGATIVE
NITRITE: NEGATIVE
Specific Gravity, Urine: >=1.03 — AB (ref 1.000–1.030)
Total Protein, Urine: NEGATIVE
UROBILINOGEN UA: 0.2 (ref 0.0–1.0)
Urine Glucose: NEGATIVE
pH: 5.5 (ref 5.0–8.0)

## 2016-07-20 MED ORDER — METAXALONE 800 MG PO TABS: 800.0000 mg | ORAL_TABLET | Freq: Three times a day (TID) | ORAL | 0 refills | Status: DC

## 2016-07-20 NOTE — Assessment & Plan Note (Addendum)
She has just recovered from unexplained abd pain now has unexplained R cw pain but no evidence at all of a lung or GI source based on H and P and suspect this is functional/ musculoskeletal  Post herpetic neuralgia would be very unusual this long p the original infection with no pain in interim but if the pain continues / refractory to nsaids/ muscle relaxants then worth trying zostrix next  I had an extended discussion with the patient reviewing all relevant studies completed to date and  lasting 15 to 20 minutes of a 25 minute visit    Each maintenance medication was reviewed in detail including most importantly the difference between maintenance and prns and under what circumstances the prns are to be triggered using an action plan format that is not reflected in the computer generated alphabetically organized AVS.    Please see instructions for details which were reviewed in writing and the patient given a copy highlighting the part that I personally wrote and discussed at today's ov. [name]

## 2016-07-20 NOTE — Progress Notes (Signed)
Subjective:    Patient ID: [name], female    DOB: April 10, 1946 .   MRN: [id number]   Brief patient profile:  23 yowf never smoker with morbid obesity complicated by gastroesophageal reflux disease, and also a history of obstructive sleep apnea and chronic rhinitis/ hbp.   History of Present Illness  04/27/2014 f/u ov/[name] re: obesity/ ohs/hbp/djd  Chief Complaint  Patient presents with  . Annual Exam    Pt fasting. Overall doing well and denies any co's today.  no reg exercise, very tired in pms  > working out issues of sleep with [name] but he has not reviewed download yet rec Please remember to go to the lab and x-ray department downstairs for your tests - we will call you with the results when they are available. Prevnar today  Dr [name] will be in touch re your bipap settings   04/19/15 tingling R chest at bra strap > rx 04/21/15  04/24/2015 f/u ov/[name] re: shingles/ palp/ hbp Chief Complaint  Patient presents with  . Acute Visit    Pt states went to UC 4 days ago and was dxed with Shingles. She states she was pain is not as severe as it was, but it has become constant. She also c/o "heart beating out of my chest"- relates to stress. She denies any CP.    stress at work, denies ex cp or p > 100 at rest and no worse palp with activity  Onset R burning 8/26 and dx with shingles 8/28 and improving on antiviral rx , only using tylenol with ok pain control  rec zostrix can be applied 4x daily if pain gets worse as the rash goes away    05/16/2015 annual f/u ov/[name] re: mo/hbp/osa/ohs/ fatigue Chief Complaint  Patient presents with  . Annual Exam    Pt fasting today. Pt states overall doing well. She still has some back pain and itching from shingles. No new co's today.   Main issue is fatigue/ too busy to ex rec  Cal balance   12/24/2015  f/u ov/[name] re: MO/ hbp/ osa/polyuria some better with urology rx  Chief Complaint  Patient presents with  . Follow-up    CPX today-was  having bladder urgency issues seeing Dr. McDermit (on meds. now)Fasting today.  avoiding taking her diovan/hct due to effects on uop rec No change rx   03/02/2016 acute extended ov/[name] re: abd pain / constipation/ h/o tics Chief Complaint  Patient presents with  . Acute Visit    Pt c/o bloating and abd pain off and on for the past 5 days. She also has had loose stools, flushing and chills.    acute onset periumbilical discomfort x 5 d prior to OV   came on over an hour and present since then continuously, not crampy,  but better p bm no nausea or loss of appetite better supine  rec Classic subdiaphragmatic pain pattern suggests ibs:   Treatment consists of avoiding foods that cause gas (especially [place] food, boiled eggs/ beans and raw vegetables like spinach and salads)  and citrucel 1 heaping tsp twice daily with a large glass of water.  Pain should improve w/in 2 weeks and if not then consider further GI work up. > dx IBS per [facility] Plan      07/21/2016 acute extended ov/[name] re:new R post cp   Chief Complaint  Patient presents with  . Acute Visit    right sided pain, toward the back x3 weeks.  also some  tenderness to the touch.  denies any redness, rash or blisters.         indolent onset/ exquisitely positional but not pleuritic, localized just beneath bra strap on R very close to where she prev had shingles but slightly lower, tender to touch skin there x 3 weeks, no assoc rash/ sob / nausea or any GI/GU symptoms at all  Does not recall injury/ some better with heat/ nsaids  No obvious day to day or daytime variability or assoc chronic cough or  chest tightness, subjective wheeze or overt sinus or hb symptoms. No unusual exp hx or h/o childhood pna/ asthma or knowledge of premature birth.  Sleeping ok without nocturnal  or early am exacerbation  of respiratory  c/o's or need for noct saba. Also denies any obvious fluctuation of symptoms with weather or environmental changes or other  aggravating or alleviating factors except as outlined above   Current Medications, Allergies, Complete Past Medical History, Past Surgical History, Family History, and Social History were reviewed in [employer] record.  ROS  The following are not active complaints unless bolded sore throat, dysphagia, dental problems, itching, sneezing,  nasal congestion or excess/ purulent secretions, ear ache,   fever, chills, sweats, unintended wt loss, classically pleuritic or exertional cp, hemoptysis,  orthopnea pnd or leg swelling better , presyncope, palpitations, abdominal pain better on rx for IBS , anorexia, nausea, vomiting, diarrhea  or change in bowel or bladder habits, change in stools or urine, dysuria,hematuria,  rash, arthralgias, visual complaints, headache, numbness, weakness or ataxia or problems with walking or coordination,  change in mood/affect or memory.                Past Medical History:  HIATAL HERNIA (ICD-553.3) with GERD  - H/o Barretts esophagus  - EGD pos HH/ stricture 04/18/10 , neg barrett's  RHINITIS, CHRONIC (ICD-472.0)  - Remote allergy testing = molds/dust only  - Sinus CT 09/06/08 mild thickening only  OBSTRUCTIVE SLEEP APNEA (ICD-327.23)..................................................[name]  --PSG 03/31/02 AHI 64  --Nocturnal BIPAP > titration done 05/16/2015  MORBID OBESITY (ICD-278.01)  - Target wt = 158 for BMI < 30  POSITIVE PPD (ICD-795.5)  DIVERTICULOSIS  Shingles R T5/6 03/2015  HEALTH MAINTENANCE........................................................Marland KitchenWert  - DT May 23, 2009  - Pneumovax 04/2013, Prevnar 04/27/2014  - CPX  12/24/2015  - GYN Levoix        Family History:  Father - CAD, ? 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  Never smoker, but Husband smoked / passed 0000000 Illicit Drug Use - no  Patient does not get regular exercise.  No etoh          Objective:   Physical Exam  wt 224 May 23, 2009 > 226 September 10, 2009 > 229 November 05, 2009 > 232 June 03, 2010 > 224 05/25/2011 >230 01/11/12 > 03/04/2012  225 > 05/17/2012  219 >217 07/29/2012 > 219 09/06/2012 >223 03/16/2013 > 04/11/2013  225 > 05/22/2013  225 > 225 06/23/2013 >  02/27/2014 215 > 04/27/2014  215 >  04/24/2015  222>  12/24/2015   214 >  03/02/2016  214 > 07/20/2016  220     Ambulatory obese  wf in no acute distress.  Vital signs reviewed - - Note on arrival 02 sats  97%% on RA and pulse 71     HEENT: nl dentition, and orophanx. Nl external ear canals without cough reflex, elongated uvula, thick tongue.  Neck without JVD/Nodes/TM  Lungs clear to A and P bilaterally without cough on insp or exp maneuvers  RRR no s3 or murmur or increase in P2.  Trace bilateral lower ext sym pitting edema Abd soft and obese but benign with nl excursion in the supine position. No bruits or organomegaly or tenderness, localized or otherwise and no rebound Ext warm without calf tenderness, cyanosis clubbing  No deformity noted.  Skin completely resolved rash s residual scar MS nl gait, no deformities Neuro alert, pleasant, no motor def    CXR PA and Lateral:   07/20/2016 :    I personally reviewed images and agree with radiology impression as follows:    1. Mild diffuse peribronchial thickening, similar to previous, and felt to most likely be related to history of asthma. Superimposed acute bronchiolitis could be considered in the correct clinical setting. 2. No other active cardiopulmonary disease. 3. Mild cardiomegaly, stable from prior.  No pulmonary edema  Note cp angles sharp   Labs ordered 07/20/2016  :  U/a neg                          Assessment & Plan:

## 2016-07-20 NOTE — Patient Instructions (Addendum)
Please remember to go to the lab and  x-ray department downstairs for your tests - we will call you with the results when they are available.  Try naprosyn per bottle instructions with food as needed for pain and apply heat   Skelexin 800 mg up to three times daily for muscle spasm   Call if not all better in 2 weeks

## 2016-07-21 ENCOUNTER — Encounter: Payer: Self-pay | Admitting: Internal Medicine

## 2016-07-21 ENCOUNTER — Ambulatory Visit: Payer: PRIVATE HEALTH INSURANCE | Admitting: Internal Medicine

## 2016-07-21 NOTE — Progress Notes (Signed)
Left detailed msg.

## 2016-07-21 NOTE — Assessment & Plan Note (Signed)
-   Target wt 158 for bmi < 30   Body mass index is 40.24 and trending up again  Lab Results  Component Value Date   TSH 1.01 12/24/2015     Contributing to gerd tendency/ doe/ risk for dvt/pe /reviewed the need and the process to achieve and maintain neg calorie balance > defer f/u primary care including intermittently monitoring thyroid status

## 2016-07-21 NOTE — Assessment & Plan Note (Signed)
Adequate control on present rx, reviewed in detail with pt > no change in rx needed   

## 2016-08-04 ENCOUNTER — Telehealth: Payer: Self-pay | Admitting: Internal Medicine

## 2016-08-04 DIAGNOSIS — R0789 Other chest pain: Secondary | ICD-10-CM

## 2016-08-04 NOTE — Telephone Encounter (Signed)
LMOMTCB x 1 

## 2016-08-05 NOTE — Telephone Encounter (Signed)
Called number listed and unable to get through "the number is not reachable" Called work number and [facility] with coworker since the was in a meeting  Will need to know where she wants to have these test done

## 2016-08-05 NOTE — Telephone Encounter (Signed)
Pt returning call [phone].[name]

## 2016-08-05 NOTE — Telephone Encounter (Signed)
CTangiogram Chest and let's do CT kidneys also "stone protocol"

## 2016-08-05 NOTE — Telephone Encounter (Signed)
ATC--I got a recording that said the number I was trying to reach is not reachable. Will try back

## 2016-08-05 NOTE — Telephone Encounter (Signed)
MW  Please Advise-  Did inform the pt. That you would not be in the office today until this afternoon and she stated that was fine. She stated also she would not be able to come in today if we were to give her an appointment. She states she followed your previous instructions and she is c/o pain on her right side where her kidneys are. She has stated she feels like it is swollen. Denies nausea,vomitting, sob   Please remember to go to the lab and  x-ray department downstairs for your tests - we will call you with the results when they are available.  Try naprosyn per bottle instructions with food as needed for pain and apply heat   Skelexin 800 mg up to three times daily for muscle spasm   Call if not all better in 2 weeks

## 2016-08-06 ENCOUNTER — Other Ambulatory Visit: Payer: Self-pay | Admitting: Internal Medicine

## 2016-08-06 DIAGNOSIS — R109 Unspecified abdominal pain: Secondary | ICD-10-CM

## 2016-08-06 NOTE — Telephone Encounter (Signed)
Spoke with the pt and notified of recs per MW She verbalized understanding  Order was sent to [facility]  

## 2016-08-06 NOTE — Telephone Encounter (Signed)
Pt returning call and can be reached @ [phone] cell.[name]

## 2016-08-07 ENCOUNTER — Encounter: Payer: Self-pay | Admitting: Internal Medicine

## 2016-08-11 ENCOUNTER — Encounter: Payer: Self-pay | Admitting: Internal Medicine

## 2016-08-12 ENCOUNTER — Encounter: Payer: Self-pay | Admitting: Internal Medicine

## 2016-08-12 DIAGNOSIS — K5730 Diverticulosis of large intestine without perforation or abscess without bleeding: Secondary | ICD-10-CM | POA: Diagnosis not present

## 2016-08-12 DIAGNOSIS — R1010 Upper abdominal pain, unspecified: Secondary | ICD-10-CM | POA: Diagnosis not present

## 2016-08-12 DIAGNOSIS — R079 Chest pain, unspecified: Secondary | ICD-10-CM | POA: Diagnosis not present

## 2016-08-12 DIAGNOSIS — R0789 Other chest pain: Secondary | ICD-10-CM | POA: Diagnosis not present

## 2016-08-13 ENCOUNTER — Telehealth: Payer: Self-pay | Admitting: Internal Medicine

## 2016-08-13 NOTE — Telephone Encounter (Signed)
lmomtcb x1 

## 2016-08-14 NOTE — Telephone Encounter (Signed)
Called and spoke with pt and she is aware of results and will call to schedule the appt with MW>

## 2016-08-14 NOTE — Telephone Encounter (Signed)
lmomtcb x1 

## 2016-08-14 NOTE — Telephone Encounter (Signed)
Completely nl, will need ov to regroup as there is no explanation for the pain she's experiencing and I can't think of anyone to send her to for additional w/u

## 2016-08-14 NOTE — Telephone Encounter (Signed)
Patient is returning phone call. She is requesting results from CT.

## 2016-08-14 NOTE — Telephone Encounter (Signed)
lmtcb x1 for pt. 

## 2016-08-14 NOTE — Telephone Encounter (Signed)
Patient returned phone call.[name] ° °

## 2016-08-14 NOTE — Telephone Encounter (Signed)
Pt calling to get the results of the CT that was done.  MW please advise. thanks

## 2016-08-20 ENCOUNTER — Ambulatory Visit (INDEPENDENT_AMBULATORY_CARE_PROVIDER_SITE_OTHER): Payer: PRIVATE HEALTH INSURANCE | Admitting: Internal Medicine

## 2016-08-20 ENCOUNTER — Encounter: Payer: Self-pay | Admitting: Internal Medicine

## 2016-08-20 VITALS — BP 126/80 | HR 88 | Ht 62.0 in | Wt 218.8 lb

## 2016-08-20 DIAGNOSIS — R0789 Other chest pain: Secondary | ICD-10-CM | POA: Diagnosis not present

## 2016-08-20 DIAGNOSIS — I10 Essential (primary) hypertension: Secondary | ICD-10-CM

## 2016-08-20 DIAGNOSIS — I872 Venous insufficiency (chronic) (peripheral): Secondary | ICD-10-CM

## 2016-08-20 NOTE — Progress Notes (Signed)
Subjective:    Patient ID: GENESSA VESSELS, female    DOB: [birth year] .   MRN: [id number]   Brief patient profile:  35 yowf never smoker with morbid obesity complicated by gastroesophageal reflux disease, and also a history of obstructive sleep apnea and chronic rhinitis/ hbp.   History of Present Illness  04/27/2014 f/u ov/Courtenay Creger re: obesity/ ohs/hbp/djd  Chief Complaint  Patient presents with  . Annual Exam    Pt fasting. Overall doing well and denies any co's today.  no reg exercise, very tired in pms  > working out issues of sleep with [name] but he has not reviewed download yet rec Please remember to go to the lab and x-ray department downstairs for your tests - we will call you with the results when they are available. Prevnar today  Dr [name] will be in touch re your bipap settings   04/19/15 tingling R chest at bra strap > rx 04/21/15  04/24/2015 f/u ov/Melisia Leming re: shingles/ palp/ hbp Chief Complaint  Patient presents with  . Acute Visit    Pt states went to UC 4 days ago and was dxed with Shingles. She states she was pain is not as severe as it was, but it has become constant. She also c/o "heart beating out of my chest"- relates to stress. She denies any CP.    stress at work, denies ex cp or p > 100 at rest and no worse palp with activity  Onset R burning 8/26 and dx with shingles 8/28 and improving on antiviral rx , only using tylenol with ok pain control  rec zostrix can be applied 4x daily if pain gets worse as the rash goes away     03/02/2016 acute extended ov/Jem Castro re: abd pain / constipation/ h/o tics Chief Complaint  Patient presents with  . Acute Visit    Pt c/o bloating and abd pain off and on for the past 5 days. She also has had loose stools, flushing and chills.    acute onset periumbilical discomfort x 5 d prior to OV   came on over an hour and present since then continuously, not crampy,  but better p bm no nausea or loss of appetite better supine  rec Classic  subdiaphragmatic pain pattern suggests ibs:   Treatment consists of avoiding foods that cause gas (especially [place] food, boiled eggs/ beans and raw vegetables like spinach and salads)  and citrucel 1 heaping tsp twice daily with a large glass of water.  Pain should improve w/in 2 weeks and if not then consider further GI work up. > dx IBS per [facility] Plan      07/21/2016 acute extended ov/Silvino Selman re:new R post cp  Since early Nov 2017  Chief Complaint  Patient presents with  . Acute Visit    right sided pain, toward the back x3 weeks.  also some tenderness to the touch.  denies any redness, rash or blisters.   indolent onset/ exquisitely positional but not pleuritic, localized just beneath bra strap on R very close to where she prev had shingles but slightly lower, tender to touch skin there x 3 weeks, no assoc rash/ sob / nausea or any GI/GU symptoms at all  Does not recall injury/ some better with heat/ nsaids rec U/a neg  CTa chest/ abd 08/12/16 >neg/ 100% resolved w/in a few days of scan    08/20/2016  Acute extended ov/Unique Sillas re: new rash both legs L > R Chief Complaint  Patient presents with  .  Acute Visit    Pt c/o swelling in both legs x 1 wk. She also has a rash on her legs. Not painful or ithcy.   indolent onset/ similar rashes in past assoc with leg swelling / mild burning worse with elastic hose/ no real itching, no fever    No obvious day to day or daytime variability or assocsob  chronic cough or cp chest tightness, subjective wheeze or overt sinus or hb symptoms. No unusual exp hx or h/o childhood pna/ asthma or knowledge of premature birth.  Sleeping ok without nocturnal  or early am exacerbation  of respiratory  c/o's or need for noct saba. Also denies any obvious fluctuation of symptoms with weather or environmental changes or other aggravating or alleviating factors except as outlined above   Current Medications, Allergies, Complete Past Medical History, Past Surgical History,  Family History, and Social History were reviewed in [employer] record.  ROS  The following are not active complaints unless bolded sore throat, dysphagia, dental problems, itching, sneezing,  nasal congestion or excess/ purulent secretions, ear ache,   fever, chills, sweats, unintended wt loss, classically pleuritic or exertional cp, hemoptysis,  orthopnea pnd or leg swelling  , presyncope, palpitations, abdominal pain , anorexia, nausea, vomiting, diarrhea  or change in bowel or bladder habits, change in stools or urine, dysuria,hematuria,  rash, arthralgias, visual complaints, headache, numbness, weakness or ataxia or problems with walking or coordination,  change in mood/affect or memory.                Past Medical History:  HIATAL HERNIA (ICD-553.3) with GERD  - H/o Barretts esophagus  - EGD pos HH/ stricture 04/18/10 , neg barrett's  RHINITIS, CHRONIC (ICD-472.0)  - Remote allergy testing = molds/dust only  - Sinus CT 09/06/08 mild thickening only  OBSTRUCTIVE SLEEP APNEA (ICD-327.23)..................................................[name]  --PSG 03/31/02 AHI 64  --Nocturnal BIPAP > titration done 05/16/2015  MORBID OBESITY (ICD-278.01)  - Target wt = 158 for BMI < 30  POSITIVE PPD (ICD-795.5)  DIVERTICULOSIS  Shingles R T5/6 03/2015  HEALTH MAINTENANCE........................................................Marland KitchenWert  - DT May 23, 2009  - Pneumovax 04/2013, Prevnar 04/27/2014  - CPX  12/24/2015  - GYN Levoix        Family History:  Father - CAD, ? 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  Never smoker, but Husband smoked / passed 0000000 Illicit Drug Use -  no  Patient does not get regular exercise.  No etoh          Objective:   Physical Exam  wt 224 May 23, 2009 > 226 September 10, 2009 > 229 November 05, 2009 > 232 June 03, 2010 > 224 05/25/2011 >230 01/11/12 > 03/04/2012  225 > 05/17/2012  219 >217 07/29/2012 > 219 09/06/2012 >223 03/16/2013 > 04/11/2013  225 > 05/22/2013  225 > 225 06/23/2013 >  02/27/2014 215 > 04/27/2014  215 >  04/24/2015  222>  12/24/2015   214 >  03/02/2016  214 > 07/20/2016  220 > 08/20/2016  219     Ambulatory obese  wf in no acute distress.  Vital signs reviewed -  HEENT: nl dentition, and orophanx. Nl external ear canals without cough reflex, elongated uvula, thick tongue.  Neck without JVD/Nodes/TM  Lungs clear to A and P bilaterally without cough on insp or exp maneuvers  RRR no s3 or murmur or increase in P2.  Trace bilateral lower ext sym pitting edema Abd soft and obese but benign with nl excursion in the supine position. No bruits or organomegaly or tenderness, localized or otherwise and no rebound Ext warm without calf tenderness, cyanosis clubbing  No deformity noted.  Skin classic venous stasis dermatitis  MS nl gait, no deformities Neuro alert, pleasant, no motor def    CXR PA and Lateral:   07/20/2016 :    I personally reviewed images and agree with radiology impression as follows:    1. Mild diffuse peribronchial thickening, similar to previous, and felt to most likely be related to history of asthma. Superimposed acute bronchiolitis could be considered in the correct clinical setting. 2. No other active cardiopulmonary disease. 3. Mild cardiomegaly, stable from prior.  No pulmonary edema  Note cp angles sharp                          Assessment & Plan:

## 2016-08-20 NOTE — Assessment & Plan Note (Signed)
Classic pattern, no blistering or signs of cellulitis > rx HC 1% bid and f/u dermatology prn

## 2016-08-20 NOTE — Assessment & Plan Note (Signed)
Ct abd/urogram 12/15/ 17 > neg  Interestingly onset was 06/24/2016 and refractory to rx for IBS/ mscp but resolved with/in a few days of neg scans either very coincidental or psychosomatic in nature > f/u prn

## 2016-08-20 NOTE — Patient Instructions (Addendum)
Dermarest =  1% Hyrocortisone lotion twice daily / elevation     If persists, see your dermatologist  Return 12/23/16 for yearly eval - call sooner if needed

## 2016-08-20 NOTE — Assessment & Plan Note (Signed)
-   Target wt 158 for bmi < 30   Body mass index is 40.02  So now borderline severe obesity Lab Results  Component Value Date   TSH 1.01 12/24/2015     Contributing to gerd tendency/ doe/reviewed the need and the process to achieve and maintain neg calorie balance

## 2016-08-20 NOTE — Assessment & Plan Note (Signed)
Adequate control on present rx, reviewed in detail with pt > no change in rx needed  / does not need additional diuretics at this point

## 2016-08-27 DIAGNOSIS — J0100 Acute maxillary sinusitis, unspecified: Secondary | ICD-10-CM | POA: Diagnosis not present

## 2016-10-06 ENCOUNTER — Ambulatory Visit: Payer: PRIVATE HEALTH INSURANCE | Admitting: Internal Medicine

## 2016-10-14 ENCOUNTER — Ambulatory Visit: Payer: PRIVATE HEALTH INSURANCE | Admitting: Internal Medicine

## 2016-10-28 ENCOUNTER — Telehealth: Payer: Self-pay | Admitting: Internal Medicine

## 2016-10-28 DIAGNOSIS — R413 Other amnesia: Secondary | ICD-10-CM

## 2016-10-28 DIAGNOSIS — R6889 Other general symptoms and signs: Secondary | ICD-10-CM

## 2016-10-28 NOTE — Telephone Encounter (Signed)
Spoke with the pt  She states that she is becoming more forgetful for the past month ""may be just from stress" She states forgetting simple things that she should be able to remember  She states that her mother and her mother's mother had alzheimer's dz and she is concerned she is developing this  MW- please advise what to do, thanks

## 2016-10-28 NOTE — Telephone Encounter (Signed)
lmtcb for pt.  

## 2016-10-28 NOTE — Telephone Encounter (Signed)
lmtcb X1 for pt. Will await call back before placing referral.

## 2016-10-28 NOTE — Telephone Encounter (Signed)
Spoke with pt. She is aware of MW's recommendation. Referral has been placed. Nothing further was needed.

## 2016-10-28 NOTE — Telephone Encounter (Signed)
Neurology eval

## 2016-10-30 DIAGNOSIS — R32 Unspecified urinary incontinence: Secondary | ICD-10-CM | POA: Diagnosis not present

## 2016-10-30 DIAGNOSIS — R351 Nocturia: Secondary | ICD-10-CM | POA: Diagnosis not present

## 2016-12-24 ENCOUNTER — Encounter: Payer: Self-pay | Admitting: Internal Medicine

## 2016-12-24 ENCOUNTER — Other Ambulatory Visit (INDEPENDENT_AMBULATORY_CARE_PROVIDER_SITE_OTHER): Payer: Commercial Managed Care - PPO

## 2016-12-24 ENCOUNTER — Ambulatory Visit (INDEPENDENT_AMBULATORY_CARE_PROVIDER_SITE_OTHER): Payer: Commercial Managed Care - PPO | Admitting: Internal Medicine

## 2016-12-24 VITALS — BP 130/64 | HR 69 | Ht 61.5 in | Wt 211.0 lb

## 2016-12-24 DIAGNOSIS — G4733 Obstructive sleep apnea (adult) (pediatric): Secondary | ICD-10-CM

## 2016-12-24 DIAGNOSIS — I10 Essential (primary) hypertension: Secondary | ICD-10-CM

## 2016-12-24 DIAGNOSIS — Z0000 Encounter for general adult medical examination without abnormal findings: Secondary | ICD-10-CM | POA: Diagnosis not present

## 2016-12-24 DIAGNOSIS — E7800 Pure hypercholesterolemia, unspecified: Secondary | ICD-10-CM | POA: Diagnosis not present

## 2016-12-24 LAB — URINALYSIS, ROUTINE W REFLEX MICROSCOPIC
KETONES UR: NEGATIVE
Leukocytes, UA: NEGATIVE
NITRITE: NEGATIVE
Specific Gravity, Urine: >=1.03 — AB (ref 1.000–1.030)
Urine Glucose: NEGATIVE
Urobilinogen, UA: 0.2 (ref 0.0–1.0)
WBC UA: NONE SEEN (ref 0–?)
pH: 5.5 (ref 5.0–8.0)

## 2016-12-24 LAB — CBC WITH DIFFERENTIAL/PLATELET
BASOS ABS: 0.1 10*3/uL (ref 0.0–0.1)
Basophils Relative: 0.9 % (ref 0.0–3.0)
EOS PCT: 1.9 % (ref 0.0–5.0)
Eosinophils Absolute: 0.2 10*3/uL (ref 0.0–0.7)
HCT: 40.8 % (ref 36.0–46.0)
HEMOGLOBIN: 13.7 g/dL (ref 12.0–15.0)
LYMPHS ABS: 3.4 10*3/uL (ref 0.7–4.0)
LYMPHS PCT: 34 % (ref 12.0–46.0)
MCHC: 33.5 g/dL (ref 30.0–36.0)
MCV: 91.2 fl (ref 78.0–100.0)
MONOS PCT: 7.7 % (ref 3.0–12.0)
Monocytes Absolute: 0.8 10*3/uL (ref 0.1–1.0)
NEUTROS PCT: 55.5 % (ref 43.0–77.0)
Neutro Abs: 5.5 10*3/uL (ref 1.4–7.7)
Platelets: 391 10*3/uL (ref 150.0–400.0)
RBC: 4.48 Mil/uL (ref 3.87–5.11)
RDW: 13.7 % (ref 11.5–15.5)
WBC: 9.9 10*3/uL (ref 4.0–10.5)

## 2016-12-24 LAB — LIPID PANEL
CHOLESTEROL: 191 mg/dL (ref 0–200)
HDL: 48.6 mg/dL (ref >39.00)
LDL CALC: 115 mg/dL — AB (ref 0–99)
NonHDL: 142.54
Total CHOL/HDL Ratio: 4
Triglycerides: 140 mg/dL (ref 0.0–149.0)
VLDL: 28 mg/dL (ref 0.0–40.0)

## 2016-12-24 LAB — HEPATIC FUNCTION PANEL
ALT: 24 U/L (ref 0–35)
AST: 20 U/L (ref 0–37)
Albumin: 4.3 g/dL (ref 3.5–5.2)
Alkaline Phosphatase: 55 U/L (ref 39–117)
Bilirubin, Direct: 0.1 mg/dL (ref 0.0–0.3)
Total Bilirubin: 0.4 mg/dL (ref 0.2–1.2)
Total Protein: 7.1 g/dL (ref 6.0–8.3)

## 2016-12-24 LAB — BASIC METABOLIC PANEL
BUN: 14 mg/dL (ref 6–23)
CALCIUM: 9.3 mg/dL (ref 8.4–10.5)
CO2: 34 mEq/L — ABNORMAL HIGH (ref 19–32)
Chloride: 102 mEq/L (ref 96–112)
Creatinine, Ser: 0.71 mg/dL (ref 0.40–1.20)
GFR: 86.77 mL/min (ref >60.00)
GLUCOSE: 95 mg/dL (ref 70–99)
POTASSIUM: 3.7 meq/L (ref 3.5–5.1)
SODIUM: 140 meq/L (ref 135–145)

## 2016-12-24 LAB — TSH: TSH: 1.06 u[IU]/mL (ref 0.35–4.50)

## 2016-12-24 NOTE — Patient Instructions (Signed)
Please remember to go to the lab  department downstairs in the basement  for your tests - we will call you with the results when they are available.     To get the most out of exercise, you need to be continuously aware that you are short of breath, but never out of breath, for 30 minutes daily. As you improve, it will actually be easier for you to do the same amount of exercise  in  30 minutes so always push to the level where you are short of breath.    Please schedule a follow up visit in 6 months but call sooner if needed

## 2016-12-24 NOTE — Progress Notes (Signed)
Subjective:    Patient ID: [name], female    DOB: [birth year] .   MRN: [id number]   Brief patient profile:  14 yowf never smoker with morbid obesity complicated by gastroesophageal reflux disease, and also a history of obstructive sleep apnea and chronic rhinitis/ hbp.   History of Present Illness  04/27/2014 f/u ov/[name] re: obesity/ ohs/hbp/djd  Chief Complaint  Patient presents with  . Annual Exam    Pt fasting. Overall doing well and denies any co's today.  no reg exercise, very tired in pms  > working out issues of sleep with [name] but he has not reviewed download yet rec Please remember to go to the lab and x-ray department downstairs for your tests - we will call you with the results when they are available. Prevnar today  Dr [name] will be in touch re your bipap settings    07/21/2016 acute extended ov/[name] re:new R post cp  Since early Nov 2017  Chief Complaint  Patient presents with  . Acute Visit    right sided pain, toward the back x3 weeks.  also some tenderness to the touch.  denies any redness, rash or blisters.   indolent onset/ exquisitely positional but not pleuritic, localized just beneath bra strap on R very close to where she prev had shingles but slightly lower, tender to touch skin there x 3 weeks, no assoc rash/ sob / nausea or any GI/GU symptoms at all  Does not recall injury/ some better with heat/ nsaids rec U/a neg  CTa chest/ abd 08/12/16 >neg/ 100% resolved w/in a few days of scan    Phone call re memory loss  10/28/16  > rec neurology eval  [facility] for July 2018     12/24/2016  f/u ov/[name] re: CPX / Chief Complaint  Patient presents with  . Annual Exam    Pt is fasting. Pt states doing well overall. She has some redness on legs- seeing derm soon.   going to derm next week for ? Stasis dermatitis /  dermirest does  Work some but still having flares   Ex :   [place] beach/ otherwise very sedentary Not limited by breathing from desired  activities    .No obvious day to day or daytime variability or assoc excess/ purulent sputum or mucus plugs or hemoptysis or cp or chest tightness, subjective wheeze or overt sinus or hb symptoms. No unusual exp hx or h/o childhood pna/ asthma or knowledge of premature birth.  Sleeping ok without nocturnal  or early am exacerbation  of respiratory  c/o's or need for noct saba. Also denies any obvious fluctuation of symptoms with weather or environmental changes or other aggravating or alleviating factors except as outlined above   Current Medications, Allergies, Complete Past Medical History, Past Surgical History, Family History, and Social History were reviewed in [employer] record.  ROS  The following are not active complaints unless bolded sore throat, dysphagia, dental problems, itching, sneezing,  nasal congestion or excess/ purulent secretions, ear ache,   fever, chills, sweats, unintended wt loss, classically pleuritic or exertional cp,  orthopnea pnd or leg swelling, presyncope, palpitations, abdominal pain, anorexia, nausea, vomiting, diarrhea  or change in bowel or bladder habits, change in stools or urine, dysuria,hematuria,  Rash both legs c/w stasis dermatitis , arthralgias, visual complaints, headache, numbness, weakness or ataxia or problems with walking or coordination,  change in mood/affect or memory- improved s rx attributes to stress  Past Medical History:  HIATAL HERNIA (ICD-553.3) with GERD  - H/o Barretts esophagus  - EGD pos HH/ stricture 04/18/10 , neg barrett's  RHINITIS, CHRONIC (ICD-472.0)  - Remote allergy testing = molds/dust only  - Sinus CT 09/06/08 mild thickening only  OBSTRUCTIVE SLEEP APNEA (ICD-327.23)..................................................[name] KitchenSood  --PSG 03/31/02 AHI 64  --Nocturnal BIPAP > titration done 05/16/2015  MORBID OBESITY (ICD-278.01)  - Target wt = 158 for BMI < 30  POSITIVE PPD (ICD-795.5)   DIVERTICULOSIS  Shingles R T5/6 03/2015  Depression / Anxiety ..............................................................[name] Karr Stasis dermatitis ..................................................................... [name] MAINTENANCE........................................................[name] KitchenWert  - DT May 23, 2009  - Pneumovax 04/2013, Prevnar 04/27/2014  - CPX 12/24/2016  - GYN Levoix        Family History:  Father - CAD, ? 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  Never smoker, but Husband smoked / passed [id number] Illicit Drug Use - no  Patient does not get regular exercise.  No etoh          Objective:   Physical Exam  wt 224 May 23, 2009 > 226 September 10, 2009 > 229 November 05, 2009 > 232 June 03, 2010 > 224 05/25/2011 >230 01/11/12 > 03/04/2012  225 > 05/17/2012  219 >217 07/29/2012 > 219 09/06/2012 >223 03/16/2013 > 04/11/2013  225 > 05/22/2013  225 > 225 06/23/2013 >  02/27/2014 215 > 04/27/2014  215 >  04/24/2015  222>  12/24/2015   214 >  03/02/2016  214 > 07/20/2016  220 > 08/20/2016  219 >  12/24/2016   211  Ambulatory obese  wf in no acute distress.  Vital signs reviewed - Note on arrival 02 sats  96% on RA      HEENT: nl dentition, and orophanx. Nl external ear canals without cough reflex, elongated uvula, thick tongue.  Neck without JVD/Nodes/TM  Lungs clear to A and P bilaterally without cough on insp or exp maneuvers  RRR no s3 or murmur or increase in P2.    1+ pitting edema lower ext = bilat (no valsartan/hct yet)  Abd soft and obese but benign with nl excursion in the supine position. No bruits or organomegaly or tenderness, localized or otherwise and  no rebound Ext warm without calf tenderness, cyanosis clubbing  No deformity noted.  Skin classic venous stasis dermatitis = both legs/ mild  MS nl gait, no deformities Neuro alert, pleasant, no motor def        Labs ordered/ reviewed:      Chemistry      Component Value Date/Time   NA 140 12/24/2016 0915   K 3.7 12/24/2016 0915   CL 102 12/24/2016 0915   CO2 34 (H) 12/24/2016 0915   BUN 14 12/24/2016 0915   CREATININE 0.71 12/24/2016 0915      Component Value Date/Time   CALCIUM 9.3 12/24/2016 0915   ALKPHOS 55 12/24/2016 0915   AST 20 12/24/2016 0915   ALT 24 12/24/2016 0915   BILITOT 0.4 12/24/2016 0915        Lab Results  Component Value Date   WBC 9.9 12/24/2016   HGB 13.7 12/24/2016  HCT 40.8 12/24/2016   MCV 91.2 12/24/2016   PLT 391.0 12/24/2016        Lab Results  Component Value Date   TSH 1.06 12/24/2016                              Assessment & Plan:

## 2016-12-25 NOTE — Progress Notes (Signed)
Spoke with pt and notified of results per Dr. [name]. Pt verbalized understanding and denied any questions. 

## 2016-12-27 ENCOUNTER — Encounter: Payer: Self-pay | Admitting: Internal Medicine

## 2016-12-27 NOTE — Assessment & Plan Note (Signed)
-   Prevnar 04/27/2014  - GI screening [facility] - GYN  [facility] - Mammos neg 10/05/13 > requested updates 12/24/2016   Up to date on vaccinations/ main issue is obesity that has not been addressed (see separate a/p)

## 2016-12-27 NOTE — Assessment & Plan Note (Signed)
Adequate control on present rx, reviewed in detail with pt > no change in rx needed    States the edema is better p takes daily diovan-hct

## 2016-12-27 NOTE — Assessment & Plan Note (Signed)
-   on Bipap chronically> retitrated 05/16/2015  - HC03 up to 33 on bmet 12/20/13 > referred back to Dr [name] - HC03  12/24/2016  = 34    Emphasized wt loss/ compliance/ f/u with Dr [name] ? At some point will need bipap?

## 2016-12-27 NOTE — Assessment & Plan Note (Addendum)
-   Target wt 158 for bmi < 30   Complicated by stasis dermatitis/ dep edema/ osa with mod elevation of hc03 suggestive of OHS   Body mass index is 39.22 kg/m.  -  trending down/ encouraged Lab Results  Component Value Date   TSH 1.06 12/24/2016     Contributing to gerd risk/ doe/reviewed the need and the process to achieve and maintain neg calorie balance

## 2016-12-27 NOTE — Assessment & Plan Note (Addendum)
-   Ideal LDL < 130 with hbp and ? Pos fm hx  Lab Results  Component Value Date   CHOL 191 12/24/2016   HDL 48.60 12/24/2016   LDLCALC 115 (H) 12/24/2016   LDLDIRECT 140.4 05/22/2013   TRIG 140.0 12/24/2016   CHOLHDL 4 12/24/2016    Adequate control on present rx, reviewed in detail with pt > no change in rx needed

## 2017-01-01 ENCOUNTER — Ambulatory Visit: Payer: PRIVATE HEALTH INSURANCE | Admitting: Neurology

## 2017-01-01 ENCOUNTER — Other Ambulatory Visit: Payer: Self-pay | Admitting: Internal Medicine

## 2017-01-08 DIAGNOSIS — A932 Colorado tick fever: Secondary | ICD-10-CM | POA: Diagnosis not present

## 2017-02-01 ENCOUNTER — Encounter: Payer: Self-pay | Admitting: Gastroenterology

## 2017-02-01 ENCOUNTER — Ambulatory Visit (INDEPENDENT_AMBULATORY_CARE_PROVIDER_SITE_OTHER): Payer: Commercial Managed Care - PPO | Admitting: Gastroenterology

## 2017-02-01 VITALS — BP 112/70 | Ht 62.0 in | Wt 211.0 lb

## 2017-02-01 DIAGNOSIS — K580 Irritable bowel syndrome with diarrhea: Secondary | ICD-10-CM | POA: Diagnosis not present

## 2017-02-01 DIAGNOSIS — K219 Gastro-esophageal reflux disease without esophagitis: Secondary | ICD-10-CM | POA: Diagnosis not present

## 2017-02-01 MED ORDER — DICYCLOMINE HCL 10 MG PO CAPS: 10.0000 mg | ORAL_CAPSULE | Freq: Three times a day (TID) | ORAL | 11 refills | Status: DC | PRN

## 2017-02-01 MED ORDER — PANTOPRAZOLE SODIUM 40 MG PO TBEC: DELAYED_RELEASE_TABLET | ORAL | 11 refills | Status: DC

## 2017-02-01 NOTE — Progress Notes (Signed)
    History of Present Illness: This is a 71 year old female with IBS D and GERD. Colonoscopy in November 2015 showed only sigmoid colon diverticulosis. EGD in August 2011 showed an EG junction stricture multiple small gastric polyps, small hiatal hernia and duodenitis. Her reflux symptoms are very well controlled. She has frequent urgent loose nonbloody watery diarrhea up to 4 times per day. Certain foods tend to lead to diarrhea but at times almost any meal can cause urgent loose stools. Symptoms have not changed.  Current Medications, Allergies, Past Medical History, Past Surgical History, Family History and Social History were reviewed in [employer] record.  Physical Exam: General: Well developed, well nourished, no acute distress Head: Normocephalic and atraumatic Eyes:  sclerae anicteric, EOMI Ears: Normal auditory acuity Mouth: No deformity or lesions Lungs: Clear throughout to auscultation Heart: Regular rate and rhythm; no murmurs, rubs or bruits Abdomen: Soft, non tender and non distended. No masses, hepatosplenomegaly or hernias noted. Normal Bowel sounds Musculoskeletal: Symmetrical with no gross deformities  Pulses:  Normal pulses noted Extremities: No clubbing, cyanosis, edema or deformities noted Neurological: Alert oriented x 4, grossly nonfocal Psychological:  Alert and cooperative. Normal mood and affect  Assessment and Recommendations:  1. IBS-D. Avoid or minimize trigger foods. Continue dicyclomine 10 mg po tid ac prn. Use Imodium bid prn.    2. GERD, well controlled. Continue pantoprazole 40 po bid and antireflux measures. REV in 1 year.

## 2017-02-01 NOTE — Patient Instructions (Signed)
You have been scheduled for an endoscopy and colonoscopy. Please follow the written instructions given to you at your visit today. Please pick up your prep supplies at the pharmacy within the next 1-3 days. If you use inhalers (even only as needed), please bring them with you on the day of your procedure. Your physician has requested that you go to www.[email or web address].[email or web address] and enter the access code given to you at your visit today. This web site gives a general overview about your procedure. However, you should still follow specific instructions given to you by our office regarding your preparation for the procedure.  Normal BMI (Body Mass Index- based on height and weight) is between 23 and 30. Your BMI today is Body mass index is 38.59 kg/m. [name] Please consider follow up  regarding your BMI with your Primary Care Provider.  Thank you for choosing me and [facility].  [name]. [name]., MD., [name]

## 2017-02-04 ENCOUNTER — Telehealth: Payer: Self-pay | Admitting: Internal Medicine

## 2017-02-04 MED ORDER — AZITHROMYCIN 250 MG PO TABS: 250.0000 mg | ORAL_TABLET | ORAL | 0 refills | Status: DC

## 2017-02-04 NOTE — Telephone Encounter (Signed)
Offer Z pak    250 mg, # 6, 2 today then one daily  Suggest aspirin if needed for joint pains and fever  Stay well hydrated.  If not better by the first of next week, get in to see Dr Melvyn Novas

## 2017-02-04 NOTE — Telephone Encounter (Signed)
CY  Please Advise-Sick Message  This is a primary care pt of MW. Pt called and stated she went to the urgent care because she got two tick bites. They drew blood and placed her on doxycycline which she has completed and both her lyme and RMSF came back negative. Pt is concerned because she is still experiencing joint ache, she has a headache,chills and fatigue. Please advise what next step for pt should be

## 2017-02-12 ENCOUNTER — Encounter: Payer: Self-pay | Admitting: Internal Medicine

## 2017-02-12 ENCOUNTER — Ambulatory Visit (INDEPENDENT_AMBULATORY_CARE_PROVIDER_SITE_OTHER): Payer: Commercial Managed Care - PPO | Admitting: Internal Medicine

## 2017-02-12 ENCOUNTER — Other Ambulatory Visit (INDEPENDENT_AMBULATORY_CARE_PROVIDER_SITE_OTHER): Payer: Commercial Managed Care - PPO

## 2017-02-12 ENCOUNTER — Other Ambulatory Visit: Payer: Self-pay | Admitting: Internal Medicine

## 2017-02-12 VITALS — BP 124/66 | HR 84 | Ht 62.0 in | Wt 209.6 lb

## 2017-02-12 DIAGNOSIS — W57XXXD Bitten or stung by nonvenomous insect and other nonvenomous arthropods, subsequent encounter: Secondary | ICD-10-CM | POA: Diagnosis not present

## 2017-02-12 DIAGNOSIS — S1096XD Insect bite of unspecified part of neck, subsequent encounter: Secondary | ICD-10-CM

## 2017-02-12 DIAGNOSIS — I10 Essential (primary) hypertension: Secondary | ICD-10-CM

## 2017-02-12 LAB — CBC WITH DIFFERENTIAL/PLATELET
BASOS PCT: 1.3 % (ref 0.0–3.0)
Basophils Absolute: 0.1 10*3/uL (ref 0.0–0.1)
EOS ABS: 0.2 10*3/uL (ref 0.0–0.7)
Eosinophils Relative: 1.9 % (ref 0.0–5.0)
HEMATOCRIT: 40.6 % (ref 36.0–46.0)
Hemoglobin: 13.5 g/dL (ref 12.0–15.0)
LYMPHS PCT: 32.2 % (ref 12.0–46.0)
Lymphs Abs: 2.7 10*3/uL (ref 0.7–4.0)
MCHC: 33.2 g/dL (ref 30.0–36.0)
MCV: 91.3 fl (ref 78.0–100.0)
Monocytes Absolute: 0.6 10*3/uL (ref 0.1–1.0)
Monocytes Relative: 7.6 % (ref 3.0–12.0)
NEUTROS ABS: 4.8 10*3/uL (ref 1.4–7.7)
Neutrophils Relative %: 57 % (ref 43.0–77.0)
PLATELETS: 349 10*3/uL (ref 150.0–400.0)
RBC: 4.45 Mil/uL (ref 3.87–5.11)
RDW: 13.8 % (ref 11.5–15.5)
WBC: 8.5 10*3/uL (ref 4.0–10.5)

## 2017-02-12 LAB — SEDIMENTATION RATE: Sed Rate: 22 mm/hr (ref 0–30)

## 2017-02-12 NOTE — Patient Instructions (Addendum)
Please remember to go to the lab   department downstairs in the basement  for your tests - we will call you with the results when they are available.  Be sure to take naprosyn with meals  Keep previous appointments

## 2017-02-12 NOTE — Progress Notes (Signed)
Subjective:    Patient ID: [name], female    DOB: 07-Jul-1946 .   MRN: [id number]   Brief patient profile:  71yowf never smoker with morbid obesity complicated by gastroesophageal reflux disease, and also a history of obstructive sleep apnea and chronic rhinitis/ hbp.   History of Present Illness  04/27/2014 f/u ov/[name] re: obesity/ ohs/hbp/djd  Chief Complaint  Patient presents with  . Annual Exam    Pt fasting. Overall doing well and denies any co's today.  no reg exercise, very tired in pms  > working out issues of sleep with [name] but he has not reviewed download yet rec Please remember to go to the lab and x-ray department downstairs for your tests - we will call you with the results when they are available. Prevnar today  Dr [name] will be in touch re your bipap settings    07/21/2016 acute extended ov/[name] re:new R post cp  Since early Nov 2017  Chief Complaint  Patient presents with  . Acute Visit    right sided pain, toward the back x3 weeks.  also some tenderness to the touch.  denies any redness, rash or blisters.   indolent onset/ exquisitely positional but not pleuritic, localized just beneath bra strap on R very close to where she prev had shingles but slightly lower, tender to touch skin there x 3 weeks, no assoc rash/ sob / nausea or any GI/GU symptoms at all  Does not recall injury/ some better with heat/ nsaids rec U/a neg  CTa chest/ abd 08/12/16 >neg/ 100% resolved w/in a few days of scan    Phone call re memory loss  10/28/16  > rec neurology eval  [facility] for July 2018    01/04/17 Tick bite, not engourage , no rash no fever but ha, knee pain, chills  4 d after bite  doxy by UC 50% better x 10 days > then same symptoms came back  Around 01/28/17 and on 02/04/17 and zpak 50%  improved  Then recurred 6/20    02/12/2017  f/u ov/[name] re: arthritis knees/ha/ no fever but chills on naprosyn bid helps Chief Complaint  Patient presents with  . Acute Visit    Pt here  today because she is having joint pain again, and fatigue and headache, and off and on sweats and chills, She was treated by the urgent and our office for her tick bite, ]  No obvious day to day or daytime variability or assoc sob/ excess/ purulent sputum or mucus plugs or hemoptysis or cp or chest tightness, subjective wheeze or overt sinus or hb symptoms. No unusual exp hx or h/o childhood pna/ asthma or knowledge of premature birth.  Sleeping ok without nocturnal  or early am exacerbation  of respiratory  c/o's or need for noct saba. Also denies any obvious fluctuation of symptoms with weather or environmental changes or other aggravating or alleviating factors except as outlined above   Current Medications, Allergies, Complete Past Medical History, Past Surgical History, Family History, and Social History were reviewed in [employer] record.  ROS  The following are not active complaints unless bolded sore throat, dysphagia, dental problems, itching, sneezing,  nasal congestion or excess/ purulent secretions, ear ache,   fever, chills, sweats, unintended wt loss, classically pleuritic or exertional cp,  orthopnea pnd or leg swelling, presyncope, palpitations, abdominal pain, anorexia, nausea, vomiting, diarrhea  or change in bowel or bladder habits, change in stools or urine, dysuria,hematuria,  rash,  arthralgias, visual complaints, headache, numbness, weakness or ataxia or problems with walking or coordination,  change in mood/affect or memory.                  Past Medical History:  HIATAL HERNIA (ICD-553.3) with GERD  - H/o Barretts esophagus  - EGD pos HH/ stricture 04/18/10 , neg barrett's  RHINITIS, CHRONIC (ICD-472.0)  - Remote allergy testing = molds/dust only  - Sinus CT 09/06/08 mild thickening only  OBSTRUCTIVE SLEEP APNEA (ICD-327.23)..................................................[name]  --PSG 03/31/02 AHI 64  --Nocturnal BIPAP > titration done 05/16/2015   MORBID OBESITY (ICD-278.01)  - Target wt = 158 for BMI < 30  POSITIVE PPD (ICD-795.5)  DIVERTICULOSIS  Shingles R T5/6 03/2015  Depression / Anxiety ..............................................................[name] Karr Stasis dermatitis ..................................................................... 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  Never smoker, but Husband smoked / passed 1696 Illicit Drug Use - no  Patient does not get regular exercise.  No etoh          Objective:   Physical Exam  wt 224 May 23, 2009 > 226 September 10, 2009 > 229 November 05, 2009 > 232 June 03, 2010 > 224 05/25/2011 >230 01/11/12 > 03/04/2012  225 > 05/17/2012  219 >217 07/29/2012 > 219 09/06/2012 >223 03/16/2013 > 04/11/2013  225 > 05/22/2013  225 > 225 06/23/2013 >  02/27/2014 215 > 04/27/2014  215 >  04/24/2015  222>  12/24/2015   214 >  03/02/2016  214 > 07/20/2016  220 > 08/20/2016  219 >  12/24/2016   211 > 02/12/2017  210   Ambulatory obese  wf in no acute distress.  Vital signs reviewed - Note on arrival 02 sats  96% on RA and bp 124/66    HEENT: nl dentition, and orophanx. Nl external ear canals without cough reflex, elongated uvula, thick tongue.  Neck without JVD/Nodes/TM  Lungs clear to A and P bilaterally without cough on insp or exp maneuvers  RRR no s3 or murmur or increase in P2.   Min pitting edema lower ext = bilat   Abd soft and obese but benign with nl  excursion in the supine position. No bruits or organomegaly or tenderness, localized or otherwise and no rebound Ext warm without calf tenderness, cyanosis clubbing  No deformity noted.  Skin classic venous stasis dermatitis = both legs/ mild  MS nl gait, no deformities Neuro alert, pleasant, no motor def     Labs ordered 02/12/2017     Lyme screen/ rickettsial ab's      Lab Results  Component Value Date   ESRSEDRATE 22 02/12/2017                           Assessment & Plan:

## 2017-02-14 ENCOUNTER — Encounter: Payer: Self-pay | Admitting: Internal Medicine

## 2017-02-14 NOTE — Assessment & Plan Note (Signed)
Adequate control on present rx, reviewed in detail with pt > no change in rx needed   

## 2017-02-14 NOTE — Assessment & Plan Note (Signed)
Occurred 01/04/17 rx doxy then zpak  - 02/12/2017 ESR 22/ antibodies sent   No rash or ongoing fever but ? Partial resp to doxy is suggestive or rickettsial infection > hold further rx pending ab's and continue prn nsaids with meals  I had an extended discussion with the patient reviewing all relevant studies completed to date and  lasting 15 to 20 minutes of a 25 minute visit    Each maintenance medication was reviewed in detail including most importantly the difference between maintenance and prns and under what circumstances the prns are to be triggered using an action plan format that is not reflected in the computer generated alphabetically organized AVS.    Please see AVS for specific instructions unique to this visit that I personally wrote and verbalized to the the pt in detail and then reviewed with pt  by my nurse highlighting any  changes in therapy recommended at today's visit to their plan of care.

## 2017-02-14 NOTE — Assessment & Plan Note (Signed)
-   Target wt 158 for bmi < 30   Body mass index is 38.34 kg/m.  -  trending down slightly/ encouraged  Lab Results  Component Value Date   TSH 1.06 12/24/2016     Contributing to gerd risk/ doe/reviewed the need and the process to achieve and maintain neg calorie balance > defer f/u primary care including intermittently monitoring thyroid status

## 2017-02-15 ENCOUNTER — Telehealth: Payer: Self-pay | Admitting: Internal Medicine

## 2017-02-15 LAB — LYME AB/WESTERN BLOT REFLEX: B burgdorferi Ab IgG+IgM: <0.9 Index (ref <0.90)

## 2017-02-15 NOTE — Progress Notes (Signed)
Spoke with pt and notified of results per Dr. [name]. Pt verbalized understanding and denied any questions. 

## 2017-02-15 NOTE — Telephone Encounter (Signed)
All the labs are not back yet  Pt aware

## 2017-02-16 LAB — RICKETTSIAL FEVER GROUP IGG/M: Typhus Fever Group IgG: <1:64 {titer}

## 2017-02-17 NOTE — Progress Notes (Signed)
Spoke with pt and notified of results per Dr. [name]. Pt verbalized understanding and denied any questions. 

## 2017-02-19 DIAGNOSIS — J019 Acute sinusitis, unspecified: Secondary | ICD-10-CM | POA: Diagnosis not present

## 2017-02-26 ENCOUNTER — Ambulatory Visit: Payer: PRIVATE HEALTH INSURANCE | Admitting: Neurology

## 2017-03-08 ENCOUNTER — Ambulatory Visit: Payer: PRIVATE HEALTH INSURANCE | Admitting: Neurology

## 2017-03-09 ENCOUNTER — Telehealth: Payer: Self-pay | Admitting: Internal Medicine

## 2017-03-09 NOTE — Telephone Encounter (Signed)
Called pt and she states the FDA issued a recall on Valsartan and she wanted to find out from Dr. Melvyn Novas what he wanted her to switch to. Called pt's pharmacy [facility] and spoke with Candance who states that the brand the patient has is not on re-call. Called the patient and informed her of what the pharmacy stated and I asked the patient did she still want a message to MW but patient declined. Nothing further is needed.

## 2017-03-22 ENCOUNTER — Ambulatory Visit: Payer: Commercial Managed Care - PPO | Admitting: Neurology

## 2017-03-23 DIAGNOSIS — L300 Nummular dermatitis: Secondary | ICD-10-CM | POA: Diagnosis not present

## 2017-03-23 DIAGNOSIS — I8310 Varicose veins of unspecified lower extremity with inflammation: Secondary | ICD-10-CM | POA: Diagnosis not present

## 2017-03-23 DIAGNOSIS — L820 Inflamed seborrheic keratosis: Secondary | ICD-10-CM | POA: Diagnosis not present

## 2017-03-24 ENCOUNTER — Encounter: Payer: Self-pay | Admitting: Neurology

## 2017-03-24 ENCOUNTER — Other Ambulatory Visit: Payer: Commercial Managed Care - PPO

## 2017-03-24 ENCOUNTER — Ambulatory Visit (INDEPENDENT_AMBULATORY_CARE_PROVIDER_SITE_OTHER): Payer: Commercial Managed Care - PPO | Admitting: Neurology

## 2017-03-24 VITALS — BP 104/60 | Ht 62.0 in | Wt 208.0 lb

## 2017-03-24 DIAGNOSIS — R413 Other amnesia: Secondary | ICD-10-CM | POA: Diagnosis not present

## 2017-03-24 NOTE — Progress Notes (Signed)
[facility] Clinic Note - Initial Visit   Date: 03/24/17  [name] MRN: [id number] DOB: [birth year]   Dear Dr. Melvyn Novas:  Thank you for your kind referral of [name] for consultation of memory loss. Although her history is well known to you, please allow [place] to reiterate it for the purpose of our medical record. The patient was accompanied to the clinic by self.   History of Present Illness: [name] is a 71 y.o. right-handed Caucasian female with obesity, GERD, depression, and hypertension, presenting for evaluation of memory changes.    Starting around March 2018, she began problems with short-term memory, such as remembering tasks and misplacing objects. She works as a Facilities manager and feels that she does not perform as well as her peers.  For instance, she may forget that a patient needs a chest x-ray after a procedure.  Some days, she can forget where her keys and wallet are and other days she is perfectly fine.  She has noticed that stress always exacerbates her symptoms.  Symptoms are not consistent and rarely occur when at home.   She lives alone and manages all ADLs and IADLs.  She manages her own finances, denies any problems with driving, takes care of her household chores, manages her own medications, and cooks for herself.   Her mother and maternal grandmother both had Alzheimer's disease and she is very concerned about this.  Out-side paper records, electronic medical record, and images have been reviewed where available and summarized as:  Lab Results  Component Value Date   TSH 1.06 12/24/2016     Past Medical History:  Diagnosis Date  . Allergic rhinitis   . Allergy   . Anxiety   . Anxiety and depression   . Arthritis   . Asthma    years ago-1993-94  . Barrett's esophagus 05/2005  . Blood transfusion without reported diagnosis    as baby  . Cataract    bilateral removal  . Depression   . Diverticulosis     . Esophageal stricture   . Gastric polyps   . GERD (gastroesophageal reflux disease)   . Hiatal hernia   . Hypertension    on meds  . Morbid obesity ([place])   . OSA (obstructive sleep apnea)   . Osteoporosis   . Positive PPD   . Sleep apnea    bi pap    Past Surgical History:  Procedure Laterality Date  . CESAREAN SECTION     x 2  . CHOLECYSTECTOMY    . COLONOSCOPY    . FINGER SURGERY     cyst removel from right index finger  . FOOT FRACTURE SURGERY     right   . FOOT NEUROMA SURGERY     left   . TONSILLECTOMY    . TUBAL LIGATION  1981  . UPPER GASTROINTESTINAL ENDOSCOPY    . WRIST FRACTURE SURGERY     right      Medications:  Outpatient Encounter Prescriptions as of 03/24/2017  Medication Sig  . acetaminophen (TYLENOL) 325 MG tablet Take 650 mg by mouth every 6 (six) hours as needed. For pain  . ALPRAZolam (XANAX) 0.25 MG tablet Take 0.125 mg by mouth 3 (three) times daily as needed. For anxiety  . Ascorbic Acid (VITAMIN C) 500 MG tablet Take 500 mg by mouth daily.    [name] b complex vitamins tablet Take 1 tablet by mouth daily.   [name] buPROPion (WELLBUTRIN XL) 300  MG 24 hr tablet Take 300 mg by mouth daily.  . Calcium Carbonate-Vitamin D 600-400 MG-UNIT per tablet Take 1 tablet by mouth daily.    [name] dicyclomine (BENTYL) 10 MG capsule Take 1 capsule (10 mg total) by mouth 3 (three) times daily as needed for spasms.  . DULoxetine (CYMBALTA) 60 MG capsule Take 60 mg by mouth daily.  . famotidine (PEPCID) 20 MG tablet Take 20 mg by mouth at bedtime as needed. For allergies  . fluticasone (FLONASE) 50 MCG/ACT nasal spray Place 2 sprays into both nostrils daily.  . mirabegron ER (MYRBETRIQ) 25 MG TB24 tablet Take 25 mg by mouth daily.  . pantoprazole (PROTONIX) 40 MG tablet TAKE 1 TABLET BY MOUTH TWICE DAILY. TAKE1 TABLET EVERY 30 TO 60 MINUTES BEFORE FIRST AND LAST MEALS OF THE DAY  . valsartan-hydrochlorothiazide (DIOVAN-HCT) 160-25 MG tablet Take 1 tablet by mouth daily.   No  facility-administered encounter medications on file as of 03/24/2017.      Allergies:  Allergies  Allergen Reactions  . Epinephrine     REACTION: tachycardia    Family History: Family History  Problem Relation Age of Onset  . Coronary artery disease Father   . Heart failure Father   . Allergies Mother   . Alzheimer's disease Mother   . Heart failure Mother   . Hypertension Brother        x 3  . Sleep apnea Brother   . Sleep apnea Son   . Colon cancer Maternal Uncle   . Lung cancer Paternal Grandfather   . Diabetes Son   . Heart attack Paternal Grandmother   . Rectal cancer Neg Hx   . Stomach cancer Neg Hx   . Esophageal cancer Neg Hx   . Pancreatic cancer Neg Hx     Social History: Social History  Substance Use Topics  . Smoking status: Never Smoker  . Smokeless tobacco: Never Used     Comment: pt has never smoked but husband smokes  . Alcohol use No   Social History   Social History Narrative  . No narrative on file    Review of Systems:  CONSTITUTIONAL: No fevers, chills, night sweats, or weight loss.   EYES: No visual changes or eye pain ENT: No hearing changes.  No history of nose bleeds.   RESPIRATORY: No cough, wheezing and shortness of breath.   CARDIOVASCULAR: Negative for chest pain, and palpitations.   GI: Negative for abdominal discomfort, blood in stools or black stools.  No recent change in bowel habits.   GU:  No history of incontinence.   MUSCLOSKELETAL: No history of joint pain or swelling.  No myalgias.   SKIN: Negative for lesions, rash, and itching.   HEMATOLOGY/ONCOLOGY: Negative for prolonged bleeding, bruising easily, and swollen nodes.  No history of cancer.   ENDOCRINE: Negative for cold or heat intolerance, polydipsia or goiter.   PSYCH:  No depression +anxiety symptoms.   NEURO: As Above.   Vital Signs:  BP 104/60   Ht 5\' 2"  (1.575 m)   Wt 208 lb (94.3 kg)   BMI 38.04 kg/m    General Medical Exam:   General:  Well  appearing, comfortable.   Eyes/ENT: see cranial nerve examination.   Neck: No masses appreciated.  Full range of motion without tenderness.  No carotid bruits. Respiratory:  Clear to auscultation, good air entry bilaterally.   Cardiac:  Regular rate and rhythm, no murmur.   Extremities:  No deformities, edema, or skin  discoloration.  Skin:  No rashes or lesions.  Neurological Exam: MENTAL STATUS including orientation to time, place, person, recent and remote memory, attention span and concentration, language, and fund of knowledge is normal.  Speech is not dysarthric.  Montreal Cognitive Assessment  03/24/2017  Visuospatial/ Executive (0/5) 4  Naming (0/3) 3  Attention: Read list of digits (0/2) 2  Attention: Read list of letters (0/1) 1  Attention: Serial 7 subtraction starting at 100 (0/3) 3  Language: Repeat phrase (0/2) 2  Language : Fluency (0/1) 1  Abstraction (0/2) 2  Delayed Recall (0/5) 4  Orientation (0/6) 5  Total 27  Adjusted Score (based on education) 27    CRANIAL NERVES: II:  No visual field defects.  Unremarkable fundi.   III-IV-VI: Pupils equal round and reactive to light.  Normal conjugate, extra-ocular eye movements in all directions of gaze.  No nystagmus.  No ptosis.   V:  Normal facial sensation.     VII:  Normal facial symmetry and movements.   VIII:  Normal hearing and vestibular function.   IX-X:  Normal palatal movement.   XI:  Normal shoulder shrug and head rotation.   XII:  Normal tongue strength and range of motion, no deviation or fasciculation.  MOTOR:  No atrophy, fasciculations or abnormal movements.  No pronator drift.  Tone is normal.    Right Upper Extremity:    Left Upper Extremity:    Deltoid  5/5   Deltoid  5/5   Biceps  5/5   Biceps  5/5   Triceps  5/5   Triceps  5/5   Wrist extensors  5/5   Wrist extensors  5/5   Wrist flexors  5/5   Wrist flexors  5/5   Finger extensors  5/5   Finger extensors  5/5   Finger flexors  5/5   Finger  flexors  5/5   Dorsal interossei  5/5   Dorsal interossei  5/5   Abductor pollicis  5/5   Abductor pollicis  5/5   Tone (Ashworth scale)  0  Tone (Ashworth scale)  0   Right Lower Extremity:    Left Lower Extremity:    Hip flexors  5/5   Hip flexors  5/5   Hip extensors  5/5   Hip extensors  5/5   Knee flexors  5/5   Knee flexors  5/5   Knee extensors  5/5   Knee extensors  5/5   Dorsiflexors  5/5   Dorsiflexors  5/5   Plantarflexors  5/5   Plantarflexors  5/5   Toe extensors  5/5   Toe extensors  5/5   Toe flexors  5/5   Toe flexors  5/5   Tone (Ashworth scale)  0  Tone (Ashworth scale)  0   MSRs:  Right                                                                 Left brachioradialis 2+  brachioradialis 2+  biceps 2+  biceps 2+  triceps 2+  triceps 2+  patellar 2+  patellar 2+  ankle jerk 2+  ankle jerk 2+  Hoffman no  Hoffman no  plantar response down  plantar response down   SENSORY:  Normal and symmetric  perception of light touch, pinprick, vibration, and proprioception.  Romberg's sign absent.   COORDINATION/GAIT: Normal finger-to- nose-finger and heel-to-shin.  Intact rapid alternating movements bilaterally.  Able to rise from a chair without using arms.  Gait narrow based and stable. Tandem and stressed gait intact.    IMPRESSION: [name] is a pleasant 71 year-old female referred for evaluation of mild difficulty with short-term memory.  She scored well on her cognitive screening assessment, missing only 3 points in several domains (-1 trail making, -1 delayed recall, -1 orientation), suggestive symptoms are more a manifestation of stress/mood disorder than underlying dementia syndrome.  Because of her strong family history of Alzhiemer's dementia, it is reasonable to assess her cognitive complaints with more focused neuropsychological testing which would better determine the nature of her symptoms and serve as a baseline.  To assess for secondary causes of memory  changes, vitamin B12 and E will be checked.  TSH is within normal limits.  I encouraged her to stay physically and mentally active with starting exercise program and doing mentally stimulating activities.  Strategies to assist with short-term memory such as making lists, reminder notes, etc. were discussed.   Return to clinic in 6 months.  The duration of this appointment visit was 45 minutes of face-to-face time with the patient.  Greater than 50% of this time was spent in counseling, explanation of diagnosis, planning of further management, and coordination of care.   Thank you for allowing me to participate in patient's care.  If I can answer any additional questions, I would be pleased to do so.    Sincerely,    Saavi Mceachron K. Posey Pronto, DO

## 2017-03-24 NOTE — Patient Instructions (Addendum)
1. Your provider has requested that you have labwork completed today. Please go to [facility] ([address]) on the second floor of this building before leaving the office today. You do not need to check in. If you are not called within 15 minutes please check with the front desk.   2. Patients age 69+:  You have been referred for a neurocognitive evaluation in our office.   The evaluation takes approximately two hours. The first part of the appointment is a clinical interview with the neuropsychologist (Dr. Macarthur Critchley). Please bring someone with you to this appointment if possible, as it is helpful for Dr. Si Raider to hear from both you and another adult who knows you well. After speaking with Dr. Si Raider, you will complete testing with her technician. The testing includes a variety of tasks- mostly question-and-answer, some paper-and-pencil. There is nothing you need to do to prepare for this appointment, but having a good night's sleep prior to the testing, and bringing eyeglasses and hearing aids (if you wear them), is advised.   About a week after the evaluation, you will return to follow up with Dr. Si Raider to review the test results. This appointment is about 30 minutes. If you would like a family member to receive this information as well, please bring them to the appointment.   We have to reserve several hours of the neuropsychologist's time and the psychometrician's time for your evaluation appointment. As such, please note that there is a No-Show fee of $100. If you are unable to attend any of your appointments, please contact our office as soon as possible to reschedule.    Patients age 60 and under:  You have been referred for a neurocognitive evaluation in our office.   The evaluation consists of three appointments.   1. The first appointment is about 45 minutes and is a clinical interview with the neuropsychologist (Dr. Macarthur Critchley). You can bring someone with you to  this appointment, as it is helpful for Dr. Si Raider to hear from both you and another adult who knows you well.   2. The second appointment is 2-3 hours long and is with the psychometrician Milana Kidney). You will complete a variety of tasks- mostly question-and-answer, some paper-and-pencil, some on the computer. There is nothing you need to do to prepare for this appointment, but having a good night's sleep prior to the testing, and bringing eyeglasses and hearing aids (if you wear them), is advised.   3. The final appointment is a follow-up with Dr. Si Raider where she will go over the test results with you and provide recommendations. This appointment is about 30 minutes.  If you would like a family member to receive this information as well, please bring them to the appointment.   We have to reserve several hours of the neuropsychologist's time and the psychometrician's time for your appointment. As such, please note that there is a No-Show fee of $100. If you are unable to attend any of your appointments, please contact our office as soon as possible to reschedule.

## 2017-03-25 ENCOUNTER — Encounter: Payer: Self-pay | Admitting: Internal Medicine

## 2017-03-25 DIAGNOSIS — R413 Other amnesia: Secondary | ICD-10-CM | POA: Insufficient documentation

## 2017-03-25 LAB — VITAMIN B12: Vitamin B-12: 692 pg/mL (ref 200–1100)

## 2017-03-25 NOTE — Addendum Note (Signed)
Addended byAnnamaria Helling on: 03/25/2017 03:37 PM   Modules accepted: Orders

## 2017-03-30 LAB — VITAMIN E
GAMMA-TOCOPHEROL (VIT E): 2.6 mg/L (ref <=4.3)
VITAMIN E (ALPHA TOCOPHEROL): 13.1 mg/L (ref 5.7–19.9)

## 2017-06-03 ENCOUNTER — Encounter: Payer: Commercial Managed Care - PPO | Admitting: Psychology

## 2017-06-03 DIAGNOSIS — L820 Inflamed seborrheic keratosis: Secondary | ICD-10-CM | POA: Diagnosis not present

## 2017-06-03 DIAGNOSIS — M25552 Pain in left hip: Secondary | ICD-10-CM | POA: Diagnosis not present

## 2017-06-21 DIAGNOSIS — M79645 Pain in left finger(s): Secondary | ICD-10-CM | POA: Diagnosis not present

## 2017-06-22 ENCOUNTER — Telehealth: Payer: Self-pay | Admitting: Internal Medicine

## 2017-06-22 NOTE — Telephone Encounter (Signed)
Needs ortho/ hand eval asap > looks like she's seen ortho or hand surgeon previously and if can't get appt with her prior doc then [facility] may be able to see her this week

## 2017-06-22 NOTE — Telephone Encounter (Signed)
ATC pt, no answer. Left message for pt to call back.   In the meanwhile, Dr. Melvyn Novas do you have any recommendations?

## 2017-06-22 NOTE — Telephone Encounter (Signed)
Pt returning call.-tr °

## 2017-06-22 NOTE — Telephone Encounter (Signed)
Spoke with pt her finger is still red, discolored and purple. She is wondering if she should come in to have you look at it. Will await your response.

## 2017-06-23 NOTE — Telephone Encounter (Signed)
Called pt and advised message from the provider. Pt understood and verbalized understanding. Nothing further is needed.    

## 2017-06-24 NOTE — Telephone Encounter (Signed)
Called spoke with patient, she is seeing Dr. Apolonio Schneiders w/ [facility] tomorrow morning Pt is aware to ask for a copy of the eval be sent to MW Nothing further needed; will sign off

## 2017-06-24 NOTE — Telephone Encounter (Signed)
MW do you have any further recs for the pt?  Thanks

## 2017-06-24 NOTE — Telephone Encounter (Signed)
No - be sure the ortho/hand doctor sends [place] copy of eval

## 2017-06-25 DIAGNOSIS — S61311A Laceration without foreign body of left index finger with damage to nail, initial encounter: Secondary | ICD-10-CM | POA: Diagnosis not present

## 2017-06-28 ENCOUNTER — Ambulatory Visit (INDEPENDENT_AMBULATORY_CARE_PROVIDER_SITE_OTHER): Payer: Commercial Managed Care - PPO | Admitting: Internal Medicine

## 2017-06-28 ENCOUNTER — Encounter: Payer: Self-pay | Admitting: Internal Medicine

## 2017-06-28 VITALS — BP 122/76 | HR 67 | Ht 62.0 in | Wt 207.0 lb

## 2017-06-28 DIAGNOSIS — M25552 Pain in left hip: Secondary | ICD-10-CM

## 2017-06-28 DIAGNOSIS — G4733 Obstructive sleep apnea (adult) (pediatric): Secondary | ICD-10-CM | POA: Diagnosis not present

## 2017-06-28 DIAGNOSIS — I872 Venous insufficiency (chronic) (peripheral): Secondary | ICD-10-CM

## 2017-06-28 DIAGNOSIS — I10 Essential (primary) hypertension: Secondary | ICD-10-CM

## 2017-06-28 NOTE — Patient Instructions (Addendum)
See Dr [name] w/in 3 months for your sleep apnea   Return in 12/2017 for CPX  - call sooner if needed

## 2017-06-28 NOTE — Assessment & Plan Note (Signed)
Adequate control on present rx, reviewed in detail with pt > no change in rx needed   She does have tendency to peripheral edema related to venous stasis and perhaps NSAID use > advised re min nsaids and salt avoidance  I had an extended discussion with the patient reviewing all relevant studies completed to date and  lasting 15 to 20 minutes of a 25 minute visit    Each maintenance medication was reviewed in detail including most importantly the difference between maintenance and prns and under what circumstances the prns are to be triggered using an action plan format that is not reflected in the computer generated alphabetically organized AVS.    Please see AVS for specific instructions unique to this visit that I personally wrote and verbalized to the the pt in detail and then reviewed with pt  by my nurse highlighting any  changes in therapy recommended at today's visit to their plan of care.

## 2017-06-28 NOTE — Assessment & Plan Note (Signed)
-   Target wt 158 for bmi < 30   Body mass index is 37.86 kg/m.  -  trending down slowly/ encouraged  Lab Results  Component Value Date   TSH 1.06 12/24/2016     Contributing to gerd risk/ doe/reviewed the need and the process to achieve and maintain neg calorie balance > defer f/u primary care including intermittently monitoring thyroid status

## 2017-06-28 NOTE — Progress Notes (Signed)
Subjective:    Patient ID: [name], female    DOB: 1946-05-05 .   MRN: [id number]   Brief patient profile:  71yowf never smoker with morbid obesity complicated by gastroesophageal reflux disease, and also a history of obstructive sleep apnea and chronic rhinitis/ hbp.   History of Present Illness  04/27/2014 f/u ov/Brylan Dec re: obesity/ ohs/hbp/djd  Chief Complaint  Patient presents with  . Annual Exam    Pt fasting. Overall doing well and denies any co's today.  no reg exercise, very tired in pms  > working out issues of sleep with [name] but he has not reviewed download yet rec Please remember to go to the lab and x-ray department downstairs for your tests - we will call you with the results when they are available. Prevnar today  Dr [name] will be in touch re your bipap settings    06/28/2017  f/u ov/Chaden Doom re:  Obesity/ ohs / osa sleeping on bipap - overdue with [name] per his last note  Chief Complaint  Patient presents with  . Follow-up    Doing well and no new co's today.   back on naprosyn most days per Kindl for L hip  arthritis  Not limited by breathing from desired activities     No obvious day to day or daytime variability or assoc excess/ purulent sputum or mucus plugs or hemoptysis or cp or chest tightness, subjective wheeze or overt sinus or hb symptoms. No unusual exp hx or h/o childhood pna/ asthma or knowledge of premature birth.  Sleeping ok flat without nocturnal  or early am exacerbation  of respiratory  c/o's or need for noct saba. Also denies any obvious fluctuation of symptoms with weather or environmental changes or other aggravating or alleviating factors except as outlined above   Current Allergies, Complete Past Medical History, Past Surgical History, Family History, and Social History were reviewed in [employer] record.  ROS  The following are not active complaints unless bolded Hoarseness, sore throat, dysphagia, dental problems,  itching, sneezing,  nasal congestion or discharge of excess mucus or purulent secretions, ear ache,   fever, chills, sweats, unintended wt loss or wt gain, classically pleuritic or exertional cp,  orthopnea pnd or leg swelling, presyncope, palpitations, abdominal pain, anorexia, nausea, vomiting, diarrhea  or change in bowel habits or change in bladder habits, change in stools or change in urine, dysuria, hematuria,  rash, arthralgias, visual complaints, headache, numbness, weakness or ataxia or problems with walking or coordination,  change in mood/affect or memory.        Current Meds  Medication Sig  . acetaminophen (TYLENOL) 325 MG tablet Take 650 mg by mouth every 6 (six) hours as needed. For pain  . ALPRAZolam (XANAX) 0.25 MG tablet Take 0.125 mg by mouth 3 (three) times daily as needed. For anxiety  . Ascorbic Acid (VITAMIN C) 500 MG tablet Take 500 mg by mouth daily.    [name] b complex vitamins tablet Take 1 tablet by mouth daily.   [name] buPROPion (WELLBUTRIN XL) 300 MG 24 hr tablet Take 300 mg by mouth daily.  . Calcium Carbonate-Vitamin D 600-400 MG-UNIT per tablet Take 1 tablet by mouth daily.    [name] dicyclomine (BENTYL) 10 MG capsule Take 1 capsule (10 mg total) by mouth 3 (three) times daily as needed for spasms.  . DULoxetine (CYMBALTA) 60 MG capsule Take 60 mg by mouth daily.  . famotidine (PEPCID) 20 MG tablet Take 20 mg by mouth  at bedtime as needed. For allergies  . fluticasone (FLONASE) 50 MCG/ACT nasal spray Place 2 sprays into both nostrils daily.  . mirabegron ER (MYRBETRIQ) 25 MG TB24 tablet Take 25 mg by mouth daily.  . naproxen (NAPROSYN) 500 MG tablet Take 1 tablet 2 (two) times daily by mouth.  . pantoprazole (PROTONIX) 40 MG tablet TAKE 1 TABLET BY MOUTH TWICE DAILY. TAKE1 TABLET EVERY 30 TO 60 MINUTES BEFORE FIRST AND LAST MEALS OF THE DAY  . valsartan-hydrochlorothiazide (DIOVAN-HCT) 160-25 MG tablet Take 1 tablet by mouth daily.                   Past Medical  History:  HIATAL HERNIA (ICD-553.3) with GERD  - H/o Barretts esophagus  - EGD pos HH/ stricture 04/18/10 , neg barrett's  RHINITIS, CHRONIC (ICD-472.0)  - Remote allergy testing = molds/dust only  - Sinus CT 09/06/08 mild thickening only  OBSTRUCTIVE SLEEP APNEA (ICD-327.23)..................................................[name]  --PSG 03/31/02 AHI 64  --Nocturnal BIPAP > titration done 05/16/2015  MORBID OBESITY (ICD-278.01)  - Target wt = 158 for BMI < 30  POSITIVE PPD (ICD-795.5)  DIVERTICULOSIS  Shingles R T5/6 03/2015  Depression / Anxiety ..............................................................[name] Karr Stasis dermatitis ..................................................................... 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  Never smoker, but Husband smoked / passed [id number] Illicit Drug Use - no  Patient does not get regular exercise.  No etoh          Objective:   Physical Exam  wt 224 May 23, 2009 > 226 September 10, 2009 > 229 November 05, 2009 > 232 June 03, 2010 > 224 05/25/2011 >230 01/11/12 > 03/04/2012  225 > 05/17/2012  219 >217 07/29/2012 > 219 09/06/2012 >223 03/16/2013 > 04/11/2013  225 > 05/22/2013  225 > 225 06/23/2013 >  02/27/2014 215 > 04/27/2014  215 >  04/24/2015  222>  12/24/2015   214 >  03/02/2016  214 > 07/20/2016  220 > 08/20/2016  219 >  12/24/2016   211 > 02/12/2017  210 > 06/28/2017   207    Ambulatory obese  wf in no  acute distress.  Vital signs reviewed - Note on arrival 02  sats 96% RA and bp 122/76      HEENT: nl dentition, and orophanx. Nl external ear canals without cough reflex, elongated uvula, thick tongue.  Neck without JVD/Nodes/TM  Lungs clear to A and P bilaterally without cough on insp or exp maneuvers  RRR no s3 or murmur or increase in P2.   1+  pitting edema lower ext = bilat   Abd soft and obese but benign with nl excursion in the supine position. No bruits or organomegaly or tenderness, localized or otherwise and no rebound Ext warm without calf tenderness, cyanosis clubbing  No deformity noted.  Skin  Min venous stasis dermatitis = both legs  MS nl gait, no deformities Neuro alert, pleasant, no motor def  Assessment & Plan:

## 2017-06-28 NOTE — Assessment & Plan Note (Signed)
-   on Bipap chronically> retitrated 05/16/2015  - HC03 up to 33 on bmet 12/20/13 > referred back to Dr [name] - HC03  12/24/2016  = 34    Sleeping well on bipap s am ha or ams > f/u [name] overdue and directed back there

## 2017-06-28 NOTE — Assessment & Plan Note (Signed)
07/29/12 Hip pain onset  No better 08/30/2012 >referred to ortho > Kindl f/u on prn naprosyn if not controlled on tyl arthritis, advised

## 2017-06-28 NOTE — Assessment & Plan Note (Signed)
Markedly improved, no change rx

## 2017-06-29 ENCOUNTER — Encounter: Payer: Commercial Managed Care - PPO | Admitting: Psychology

## 2017-07-05 ENCOUNTER — Telehealth: Payer: Self-pay | Admitting: Internal Medicine

## 2017-07-05 DIAGNOSIS — Z1231 Encounter for screening mammogram for malignant neoplasm of breast: Secondary | ICD-10-CM

## 2017-07-05 NOTE — Telephone Encounter (Signed)
That's fine

## 2017-07-05 NOTE — Telephone Encounter (Signed)
Patient called back - she can be reached at work at [phone] - it has a different name on the vm - but that is where she is sitting today -pr

## 2017-07-05 NOTE — Telephone Encounter (Signed)
Referral placed. Pt advised and nothing further is needed.

## 2017-07-05 NOTE — Telephone Encounter (Signed)
Called pt to verify message. No answer and left message to call back.   In the meanwhile, MW is it ok to order mammogram?

## 2017-07-06 ENCOUNTER — Encounter: Payer: Commercial Managed Care - PPO | Admitting: Psychology

## 2017-07-26 ENCOUNTER — Encounter: Payer: Self-pay | Admitting: Psychology

## 2017-07-26 ENCOUNTER — Ambulatory Visit (INDEPENDENT_AMBULATORY_CARE_PROVIDER_SITE_OTHER): Payer: Commercial Managed Care - PPO | Admitting: Psychology

## 2017-07-26 DIAGNOSIS — R413 Other amnesia: Secondary | ICD-10-CM | POA: Diagnosis not present

## 2017-07-26 DIAGNOSIS — F329 Major depressive disorder, single episode, unspecified: Secondary | ICD-10-CM

## 2017-07-26 DIAGNOSIS — F32A Depression, unspecified: Secondary | ICD-10-CM

## 2017-07-26 NOTE — Progress Notes (Signed)
   Neuropsychology Note  [name] came in today for 2 hours of neuropsychological testing with technician, Milana Kidney, BS, under the supervision of Dr. Macarthur Critchley. The patient did not appear overtly distressed by the testing session, per behavioral observation or via self-report to the technician. Rest breaks were offered. [name] will return within 2 weeks for a feedback session with Dr. Si Raider at which time her test performances, clinical impressions and treatment recommendations will be reviewed in detail. The patient understands she can contact our office should she require our assistance before this time.  Full report to follow.

## 2017-07-26 NOTE — Progress Notes (Signed)
NEUROPSYCHOLOGICAL INTERVIEW (CPT: [id number])  Name: Manson Allan Date of Birth: 1946/07/15 Date of Interview: 07/26/2017  Reason for Referral:  KEIRSTIN MUSIL is a 71 y.o. female who is referred for neuropsychological evaluation by Dr. Narda Amber of [facility] due to concerns about memory loss. This patient is unaccompanied in the office for today's appointment  History of Presenting Problem:  Ms. Buras was seen for neurologic consultation of memory loss by Dr. Posey Pronto on 03/24/2017. MoCA was 27/30. Family history is significant for AD in the patient's mother and maternal grandmother (neither were early onset). Her paternal grandmother had vascular dementia. The patient is concerned about recent memory decline and whether she herself is developing AD. She reports noticing memory problems over the past 9 mos to a year. She notices this is worse when she is stressed. She reports increased stress over the past year related to work and her son's health issues. She reports she notices memory problems both at home and at work. She works full time as a Facilities manager. Her family has not noticed or commented on any cognitive changes. She has not had any negative reviews or issues with performance evaluations at work. She reports she is mostly frustrated by not being able to remember where she has put things. She endorses problems with organization. She reports lifelong attention difficulties and probable undiagnosed ADD. Both of her sons have ADD and her grandson does as well. She recalls having attention difficulties as a child in school. She did not have learning problems and was never held back. Her attention difficulties have persisted through her life but she feels they are worse now. She reports difficulty concentrating, distractibility and difficulty carrying tasks out to completion (starting other tasks before she has finished the first one). She endorses occasional word finding difficulty  but it is not severe. She denies any issues with visual-spatial navigation. She lives alone and manages all IADLs independently with no reported difficulty.  She has a history of depression and anxiety. She sees Dr. [name] for psychiatry. Per her med list, she is prescribed Xanax (0.125 mg up to 3 times daily as needed), Wellbutrin, and Cymbalta. She has seen Dr. Cheryln Manly in the past for therapy related to work stress. She denies any history of suicidal ideation or intention. She denies history of inpatient psychiatric hospitalization. She denies history of substance abuse. She reports her mood has been "pretty good" but that she has been experiencing more grief over the past year than she did in the past and she is unsure why. Her husband passed away in December 09, 2010.   She denies any problems with balance or walking. She has not had any falls. She denies history of head injury. She does have chronic pain in her coccyx which has persisted since she fractured it in a horse riding accident many years ago. She takes Tylenol daily. She was also recently prescribed Naproxen for hip pain. She denied any sleep difficulty. Per records, she is diagnosed with sleep apnea and uses a bi-pap. She does not drink any alcohol.   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 Marital history: Widowed (husband passed away in 12-09-10). Two sons. Alcohol: None Tobacco: Never smoker but +second hand smoke for many years (husband smoked)   Medical History: Past Medical History:  Diagnosis Date  . Allergic rhinitis   . Allergy   . Anxiety   .  Anxiety and depression   . Arthritis   . Asthma    years ago-1993-94  . Barrett's esophagus 05/2005  . Blood transfusion without reported diagnosis    as baby  . Cataract    bilateral removal  . Depression   . Diverticulosis   .  Esophageal stricture   . Gastric polyps   . GERD (gastroesophageal reflux disease)   . Hiatal hernia   . Hypertension    on meds  . Morbid obesity ([place])   . OSA (obstructive sleep apnea)   . Osteoporosis   . Positive PPD   . Sleep apnea    bi pap     Current Medications:  Outpatient Encounter Medications as of 07/26/2017  Medication Sig  . acetaminophen (TYLENOL) 325 MG tablet Take 650 mg by mouth every 6 (six) hours as needed. For pain  . ALPRAZolam (XANAX) 0.25 MG tablet Take 0.125 mg by mouth 3 (three) times daily as needed. For anxiety  . Ascorbic Acid (VITAMIN C) 500 MG tablet Take 500 mg by mouth daily.    [name] b complex vitamins tablet Take 1 tablet by mouth daily.   [name] buPROPion (WELLBUTRIN XL) 300 MG 24 hr tablet Take 300 mg by mouth daily.  . Calcium Carbonate-Vitamin D 600-400 MG-UNIT per tablet Take 1 tablet by mouth daily.    [name] dicyclomine (BENTYL) 10 MG capsule Take 1 capsule (10 mg total) by mouth 3 (three) times daily as needed for spasms.  . DULoxetine (CYMBALTA) 60 MG capsule Take 60 mg by mouth daily.  . famotidine (PEPCID) 20 MG tablet Take 20 mg by mouth at bedtime as needed. For allergies  . fluticasone (FLONASE) 50 MCG/ACT nasal spray Place 2 sprays into both nostrils daily.  . mirabegron ER (MYRBETRIQ) 25 MG TB24 tablet Take 25 mg by mouth daily.  . naproxen (NAPROSYN) 500 MG tablet Take 1 tablet 2 (two) times daily by mouth.  . pantoprazole (PROTONIX) 40 MG tablet TAKE 1 TABLET BY MOUTH TWICE DAILY. TAKE1 TABLET EVERY 30 TO 60 MINUTES BEFORE FIRST AND LAST MEALS OF THE DAY  . valsartan-hydrochlorothiazide (DIOVAN-HCT) 160-25 MG tablet Take 1 tablet by mouth daily.   No facility-administered encounter medications on file as of 07/26/2017.      Behavioral Observations:   Appearance: Neatly, casually and appropriately dressed and groomed Gait: Ambulated independently, short stepped gait, otherwise normal Speech: Fluent; normal rate, rhythm and volume. No  significant word finding difficulty. Thought process: Linear, goal directed Affect: Full, mildly anxious Interpersonal: Very pleasant, appropriate   TESTING: There is medical necessity to proceed with neuropsychological assessment as the results will be used to aid in differential diagnosis and clinical decision-making and to inform specific treatment recommendations. Per the patient and medical records reviewed, there has been a change in cognitive functioning and a reasonable suspicion of neurocognitive disorder (suspect longstanding undiagnosed ADD with anxiety/depression and psychosocial stressors exacerbating these symptoms, but need to rule out prodromal AD given family history).  Following the clinical interview, the patient completed a full battery of neuropsychological testing with my psychometrician under my supervision.   PLAN: The patient will return to see me for a follow-up session at which time her test performances and my impressions and treatment recommendations will be reviewed in detail.  Full report to follow.

## 2017-07-27 ENCOUNTER — Encounter: Payer: Self-pay | Admitting: Psychology

## 2017-08-08 NOTE — Progress Notes (Signed)
NEUROPSYCHOLOGICAL EVALUATION   Name:    [name]  Date of Birth:   March 11, 1946 Date of Interview:  07/26/2017 Date of Testing:  07/26/2017   Date of Feedback:  08/09/2017       Background Information:  Reason for Referral:  [name] is a 71 y.o. female referred by Dr. Narda Amber to assess her current level of cognitive functioning and assist in differential diagnosis. The current evaluation consisted of a review of available medical records, an interview with the patient and the completion of a neuropsychological testing battery. Informed consent was obtained.  History of Presenting Problem:  [name] was seen for neurologic consultation of memory loss by Dr. Posey Pronto on 03/24/2017. MoCA was 27/30. Family history is significant for AD in the patient's mother and maternal grandmother (neither were early onset). Her paternal grandmother had vascular dementia. The patient is concerned about recent memory decline and whether she herself is developing AD. She reports noticing memory problems over the past 9 mos to a year. She notices this is worse when she is stressed. She reports increased stress over the past year related to work and her son's health issues. She reports she notices memory problems both at home and at work. She works full time as a Facilities manager. Her family has not noticed or commented on any cognitive changes. She has not had any negative reviews or issues with performance evaluations at work. She reports she is mostly frustrated by not being able to remember where she has put things. She endorses problems with organization. She reports lifelong attention difficulties and probable undiagnosed ADD. Both of her sons have ADD and her grandson does as well. She recalls having attention difficulties as a child in school. She did not have learning problems and was never held back. Her attention difficulties have persisted through her life but she feels they are worse  now. She reports difficulty concentrating, distractibility and difficulty carrying tasks out to completion (starting other tasks before she has finished the first one). She endorses occasional word finding difficulty but it is not severe. She denies any issues with visual-spatial navigation. She lives alone and manages all IADLs independently with no reported difficulty.  She has a history of depression and anxiety. She sees Dr. [name] for psychiatry. Per her med list, she is prescribed Xanax (0.125 mg up to 3 times daily as needed), Wellbutrin, and Cymbalta. She has seen Dr. Cheryln Manly in the past for therapy related to work stress. She denies any history of suicidal ideation or intention. She denies history of inpatient psychiatric hospitalization. She denies history of substance abuse. She reports her mood has been "pretty good" but that she has been experiencing more grief over the past year than she did in the past and she is unsure why. Her husband passed away in 11-Dec-2010.   She denies any problems with balance or walking. She has not had any falls. She denies history of head injury. She does have chronic pain in her coccyx which has persisted since she fractured it in a horse riding accident many years ago. She takes Tylenol daily. She was also recently prescribed Naproxen for hip pain. She denied any sleep difficulty. Per records, she is diagnosed with sleep apnea and uses a bi-pap. She does not drink any alcohol.   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 Marital history:  Widowed (husband passed away in 2010-11-29). Two sons. Alcohol: None Tobacco: Never smoker but +second hand smoke for many years (husband smoked)  Medical History:  Past Medical History:  Diagnosis Date  . Allergic rhinitis   . Allergy   . Anxiety   . Anxiety and depression    . Arthritis   . Asthma    years ago-1993-94  . Barrett's esophagus 05/2005  . Blood transfusion without reported diagnosis    as baby  . Cataract    bilateral removal  . Depression   . Diverticulosis   . Esophageal stricture   . Gastric polyps   . GERD (gastroesophageal reflux disease)   . Hiatal hernia   . Hypertension    on meds  . Morbid obesity ([place])   . OSA (obstructive sleep apnea)   . Osteoporosis   . Positive PPD   . Sleep apnea    bi pap    Current medications:  Outpatient Encounter Medications as of 08/09/2017  Medication Sig  . acetaminophen (TYLENOL) 325 MG tablet Take 650 mg by mouth every 6 (six) hours as needed. For pain  . ALPRAZolam (XANAX) 0.25 MG tablet Take 0.125 mg by mouth 3 (three) times daily as needed. For anxiety  . Ascorbic Acid (VITAMIN C) 500 MG tablet Take 500 mg by mouth daily.    [name] b complex vitamins tablet Take 1 tablet by mouth daily.   [name] buPROPion (WELLBUTRIN XL) 300 MG 24 hr tablet Take 300 mg by mouth daily.  . Calcium Carbonate-Vitamin D 600-400 MG-UNIT per tablet Take 1 tablet by mouth daily.    [name] dicyclomine (BENTYL) 10 MG capsule Take 1 capsule (10 mg total) by mouth 3 (three) times daily as needed for spasms.  . DULoxetine (CYMBALTA) 60 MG capsule Take 60 mg by mouth daily.  . famotidine (PEPCID) 20 MG tablet Take 20 mg by mouth at bedtime as needed. For allergies  . fluticasone (FLONASE) 50 MCG/ACT nasal spray Place 2 sprays into both nostrils daily.  . mirabegron ER (MYRBETRIQ) 25 MG TB24 tablet Take 25 mg by mouth daily.  . naproxen (NAPROSYN) 500 MG tablet Take 1 tablet 2 (two) times daily by mouth.  . pantoprazole (PROTONIX) 40 MG tablet TAKE 1 TABLET BY MOUTH TWICE DAILY. TAKE1 TABLET EVERY 30 TO 60 MINUTES BEFORE FIRST AND LAST MEALS OF THE DAY  . valsartan-hydrochlorothiazide (DIOVAN-HCT) 160-25 MG tablet Take 1 tablet by mouth daily.   No facility-administered encounter medications on file as of 08/09/2017.       Current Examination:  Behavioral Observations:  Appearance: Neatly, casually and appropriately dressed and groomed Gait: Ambulated independently, short stepped gait, otherwise normal Speech: Fluent; normal rate, rhythm and volume. No significant word finding difficulty. Thought process: Linear, goal directed Affect: Full, mildly anxious Interpersonal: Very pleasant, appropriate Orientation: Oriented to all spheres. Accurately named the current President and his predecessor.   Tests Administered: . Test of Premorbid Functioning (TOPF) . Wechsler Adult Intelligence Scale-Fourth Edition (WAIS-IV): Similarities, Music therapist, Coding and Digit Span subtests . Wechsler Memory Scale-Fourth Edition (WMS-IV) Older Adult Version (ages 54-90): Logical Memory I, II and Recognition subtests  . [employer] Verbal Learning Test - 2nd Edition (CVLT-2) Short Form . Conners CPT3 - Continuous  Performance Test 3rd Edition . Repeatable Battery for the Assessment of Neuropsychological Status (RBANS) Form A:  Figure Copy and Recall subtests and Semantic Fluency subtest . Boston Naming Test (BNT) . Boston Diagnostic Aphasia Examination: Complex Ideational Material subtest . Controlled Oral Word  Association Test (COWAT) . Trail Making Test A and B . Clock drawing test . Beck Depression Inventory - 2nd Edition (BDI-II) . Generalized Anxiety Disorder - 7 item screener (GAD-7)  Test Results: Note: Standardized scores are presented only for use by appropriately trained professionals and to allow for any future test-retest comparison. These scores should not be interpreted without consideration of all the information that is contained in the rest of the report. The most recent standardization samples from the test publisher or other sources were used whenever possible to derive standard scores; scores were corrected for age, gender, ethnicity and education when available.   Test Scores:  Test Name Raw Score  Standardized Score Descriptor  TOPF 33/70 SS= 93 Average  WAIS-IV Subtests     Similarities 22/36 ss= 9 Average  Block Design 24/66 ss= 8 Low end of average  Coding 59/135 ss= 11 Average  Digit Span  22/48 ss= 8 Low end of average  WMS-IV Subtests     LM I 34/53 ss= 11 Average  LM II 24/39 ss= 12 High average  LM II Recognition 21/23 Cum %: >75 Above average  RBANS Subtests     Figure Copy 14/20 Z= -2.4 Impaired  Figure Recall 11/20 Z= -0.7 Average  Semantic Fluency 19/40 Z= -0.4 Average  CVLT-II Scores     Trial 1 3/9 Z= -2.5 Impaired  Trial 4 7/9 Z= -1 Low average  Trials 1-4 total 19/36 T= 32 Borderline  SD Free Recall 6/9 Z= -1 Low average  LD Free Recall 5/9 Z= -1 Low average  LD Cued Recall 5/9 Z= -1.5 Borderline  Recognition Discriminability 9/9 hits, 1 false positive Z= 0 Average  Forced Choice Recognition 9/9  WNL  CPT3     Detectability  T= 65 Elevated  Omissions  T= 72 Very Elevated  Commissions  T= 53 Average  Perseverations  T= 51 Average  HRT SD  T= 63 Elevated  HRT Block Change  T= 81 Very Elevated  BNT 50/60 T= 31 Borderline  BDAE Subtest     Complex Ideational Material 12/12  WNL  COWAT-FAS 35 T= 44 Average  COWAT-Animals 16 T= 45 Average  Trail Making Test A  39" 0 errors T= 47 Average  Trail Making Test B  92" 0 errors T= 47 Average  Clock Drawing   WNL  BDI-II 12/63  WNL  GAD-7 3/21  WNL      Description of Test Results:  Premorbid verbal intellectual abilities were estimated to have been within the average range based on a test of word reading. Psychomotor processing speed was average. Auditory attention and working memory were low end of average. On a computerized test of visual sustained attention that is highly sensitive to ADHD, her profile of scores and response pattern indicated problems with inattentiveness, sustained attention and vigilance. Relative to the normative sample, the patient was less able to differentiate targets from  non-targets, made more ommission errors, displayed less consistency in response speed, and displayed more of a reduction in response speed in later blocks. Visual-spatial construction was variable. Specifically, her ability to manipulate three dimensional blocks to match two dimensional stimuli was within normal limits (low end of average), while her drawn copy of a complex geometric figure was impaired. Regarding the latter, there was no evidence of gross perceptual deficit, but there were indicators of reduced attention to detail. Language abilities were also somewhat variable. Specifically, confrontation naming was borderline impaired, while semantic verbal fluency was average and auditory  comprehension of complex ideational material was intact. With regard to verbal memory, encoding and acquisition of non-contextual information (i.e., word list) was borderline impaired across four learning trials. After a brief distracter task, free recall was low average (6/9 items recalled). After a delay, free recall was low average (5/9 items recalled). She did not recall any additional items with semantic cueing (borderline impaired). Performance on a yes/no recognition task was average. On another verbal memory test, encoding and acquisition of contextual auditory information (i.e., short stories) was average. After a delay, free recall was high average. Performance on a yes/no recognition task was above average. With regard to non-verbal memory, delayed free recall of visual information was average. She performed within normal limits across measures of various aspects of executive functioning. Mental flexibility and set-shifting were average on Trails B. Verbal fluency with phonemic search restrictions was average. Verbal abstract reasoning was average. Performance on a clock drawing task was normal. On a self-report measure of mood, the patient's responses were not indicative of clinically significant depression at the  present time, although she did endorse several mild symptoms (mild sadness, pessimism, feelings of failure, guilty feelings, punishment feelings, loss of self confidence, self-criticalness, feelings of worthlessness, reduced energy, increased sleep, concentration difficulty and fatigue). On a self-report measure of anxiety, the patient did not endorse clinically significant generalized anxiety at the present time.    Clinical Impressions: ADHD, predominantly inattentive type.  Results of cognitive testing were largely within normal limits for age and commensurate with estimated premorbid intellectual abilities. She did demonstrate impairments in sustained attention. These findings, alongside lifelong history of attention difficulties and strong family history of ADHD, is highly suggestive of persisting ADHD, predominantly inattentive type. She reports increased stress in the past year, coinciding with increased attention difficulties. I suspect her cognitive complaints are secondary to ADHD exacerbated by recent stressors. While she does not meet diagnostic criteria for a current major depressive episode, there are some indicators of mild depression at the present time. I do not see any evidence to suggest developing Alzheimer's disease. Her testing results certainly do not indicate dementia at this time.    Recommendations/Plan: Based on the findings of the present evaluation, the following recommendations are offered:  1. With the patient's written authorization, we will share this report with Dr. [name] for purposes of continuity of care. 2. I advised the patient that she may wish to consider counseling again given her increased stress level. There is a therapist with [facility] (Dr. Royetta Crochet) who specializes in ADHD if she cannot get in with Dr. Cheryln Manly again. 3. The patient was reassured there is no sign of dementia or underlying AD. Strategies to promote brain health were  reviewed. These test results can be used as a baseline for future comparison if needed.   Feedback to Patient: [name] returned for a feedback appointment on 08/09/2017 to review the results of her neuropsychological evaluation with this provider. 15 minutes face-to-face time was spent reviewing her test results, my impressions and my recommendations as detailed above.    Total time spent on this patient's case: [id number] unit for interview with psychologist; [phone] units of testing by psychometrician under psychologist's supervision; [phone] units for medical record review, scoring of neuropsychological tests, interpretation of test results, preparation of this report, and review of results to the patient by psychologist.      Thank you for your referral of [name]. Please feel free to contact me if you  have any questions or concerns regarding this report.

## 2017-08-09 ENCOUNTER — Encounter: Payer: Self-pay | Admitting: Psychology

## 2017-08-09 ENCOUNTER — Other Ambulatory Visit: Payer: Self-pay | Admitting: Internal Medicine

## 2017-08-09 ENCOUNTER — Ambulatory Visit (INDEPENDENT_AMBULATORY_CARE_PROVIDER_SITE_OTHER): Payer: Commercial Managed Care - PPO | Admitting: Psychology

## 2017-08-09 DIAGNOSIS — N3941 Urge incontinence: Secondary | ICD-10-CM | POA: Diagnosis not present

## 2017-08-09 DIAGNOSIS — M25552 Pain in left hip: Secondary | ICD-10-CM | POA: Diagnosis not present

## 2017-08-09 DIAGNOSIS — R413 Other amnesia: Secondary | ICD-10-CM

## 2017-08-09 DIAGNOSIS — N3944 Nocturnal enuresis: Secondary | ICD-10-CM | POA: Diagnosis not present

## 2017-08-09 DIAGNOSIS — F900 Attention-deficit hyperactivity disorder, predominantly inattentive type: Secondary | ICD-10-CM

## 2017-08-09 NOTE — Patient Instructions (Addendum)
Your test results are NOT consistent with dementia or Alzheimer's disease at this time.  I do think you have an attention deficit disorder (now called ADHD) which is a disorder that starts in childhood and can persist into adulthood.  Given increased stress in your life, you may want to consider doing some counseling with a psychologist again.   In order to maintain brain health, you are encouraged to engage in regular cardiovascular exercise, keep using your CPAP when you sleep, and regularly engage in activities that provide social interaction.  We can use these test results as a baseline for comparison if you feel you notice any cognitive changes in the future.  Have a happy holiday and I wish you a happy and healthy new year!

## 2017-08-10 ENCOUNTER — Telehealth: Payer: Self-pay | Admitting: Internal Medicine

## 2017-08-10 NOTE — Telephone Encounter (Signed)
Spoke with pt. She is aware that as soon as MW signs this order, [facility] will refax this. Nothing further was needed.

## 2017-08-10 NOTE — Telephone Encounter (Signed)
The order was faxed and we received conformation it went through we will print it and re-fax after dr wert signs the order thanks Joellen Jersey

## 2017-08-10 NOTE — Telephone Encounter (Signed)
[facility]'s pt is calling and stating that [facility] has not received the referral for the mammogram.  Pt is requesting that this be resent to them.  Thanks

## 2017-08-28 DIAGNOSIS — M19019 Primary osteoarthritis, unspecified shoulder: Secondary | ICD-10-CM | POA: Insufficient documentation

## 2017-08-28 DIAGNOSIS — S61319A Laceration without foreign body of unspecified finger with damage to nail, initial encounter: Secondary | ICD-10-CM | POA: Insufficient documentation

## 2017-08-28 DIAGNOSIS — M6530 Trigger finger, unspecified finger: Secondary | ICD-10-CM | POA: Insufficient documentation

## 2017-08-30 DIAGNOSIS — M1612 Unilateral primary osteoarthritis, left hip: Secondary | ICD-10-CM | POA: Diagnosis not present

## 2017-08-30 DIAGNOSIS — M25552 Pain in left hip: Secondary | ICD-10-CM | POA: Diagnosis not present

## 2017-09-10 ENCOUNTER — Telehealth: Payer: Self-pay

## 2017-09-10 NOTE — Telephone Encounter (Signed)
PA with [facility] for pantoprazole 40 mg twice daily is approved through 09/07/18.

## 2017-09-12 ENCOUNTER — Emergency Department (HOSPITAL_COMMUNITY)
Admission: EM | Admit: 2017-09-12 | Discharge: 2017-09-12 | Discharge status: Against Medical Advice | Payer: Commercial Managed Care - PPO | Attending: Emergency Medicine | Admitting: Emergency Medicine

## 2017-09-12 ENCOUNTER — Encounter (HOSPITAL_COMMUNITY): Payer: Self-pay | Admitting: Nurse Practitioner

## 2017-09-12 ENCOUNTER — Emergency Department (HOSPITAL_COMMUNITY): Payer: Commercial Managed Care - PPO

## 2017-09-12 DIAGNOSIS — Z5321 Procedure and treatment not carried out due to patient leaving prior to being seen by health care provider: Secondary | ICD-10-CM | POA: Insufficient documentation

## 2017-09-12 DIAGNOSIS — R0789 Other chest pain: Secondary | ICD-10-CM | POA: Diagnosis present

## 2017-09-12 DIAGNOSIS — R079 Chest pain, unspecified: Secondary | ICD-10-CM | POA: Diagnosis not present

## 2017-09-12 NOTE — ED Triage Notes (Signed)
Pt is c/o chest-epigastric pain that she reports was evaluated about 8 hours ago while [place] beach. Adds that's her cardiac work up was negative, a GI cocktail helped her pain but the pain is back and the tums she took at home did not relieve her pain.

## 2017-09-12 NOTE — ED Notes (Signed)
Pt stated that since she had all the test done earlier today, at another hospital she was going to wait and go to her PCP in the morning

## 2017-09-13 ENCOUNTER — Encounter: Payer: Self-pay | Admitting: Adult Health

## 2017-09-13 ENCOUNTER — Other Ambulatory Visit (INDEPENDENT_AMBULATORY_CARE_PROVIDER_SITE_OTHER): Payer: Commercial Managed Care - PPO

## 2017-09-13 ENCOUNTER — Ambulatory Visit (INDEPENDENT_AMBULATORY_CARE_PROVIDER_SITE_OTHER): Payer: Commercial Managed Care - PPO | Admitting: Adult Health

## 2017-09-13 VITALS — BP 136/68 | HR 71 | Ht 62.0 in | Wt 200.0 lb

## 2017-09-13 DIAGNOSIS — R1013 Epigastric pain: Secondary | ICD-10-CM | POA: Insufficient documentation

## 2017-09-13 DIAGNOSIS — R109 Unspecified abdominal pain: Secondary | ICD-10-CM

## 2017-09-13 DIAGNOSIS — G4710 Hypersomnia, unspecified: Secondary | ICD-10-CM | POA: Diagnosis not present

## 2017-09-13 LAB — CBC WITH DIFFERENTIAL/PLATELET
BASOS ABS: 0.1 10*3/uL (ref 0.0–0.1)
Basophils Relative: 0.3 % (ref 0.0–3.0)
EOS ABS: 0 10*3/uL (ref 0.0–0.7)
Eosinophils Relative: 0.1 % (ref 0.0–5.0)
HEMATOCRIT: 42.1 % (ref 36.0–46.0)
HEMOGLOBIN: 13.8 g/dL (ref 12.0–15.0)
Lymphs Abs: 0.9 10*3/uL (ref 0.7–4.0)
MCHC: 32.8 g/dL (ref 30.0–36.0)
MCV: 89.9 fl (ref 78.0–100.0)
MONO ABS: 1.3 10*3/uL — AB (ref 0.1–1.0)
Monocytes Relative: 7 % (ref 3.0–12.0)
Neutro Abs: 15.7 10*3/uL — ABNORMAL HIGH (ref 1.4–7.7)
Neutrophils Relative %: 87.8 % — ABNORMAL HIGH (ref 43.0–77.0)
PLATELETS: 314 10*3/uL (ref 150.0–400.0)
RBC: 4.69 Mil/uL (ref 3.87–5.11)
RDW: 14.5 % (ref 11.5–15.5)
WBC: 17.9 10*3/uL — AB (ref 4.0–10.5)

## 2017-09-13 LAB — URINALYSIS, ROUTINE W REFLEX MICROSCOPIC
LEUKOCYTES UA: NEGATIVE
Nitrite: POSITIVE — AB
PH: 6 (ref 5.0–8.0)
Specific Gravity, Urine: 1.025 (ref 1.000–1.030)
TOTAL PROTEIN, URINE-UPE24: 30 — AB
URINE GLUCOSE: NEGATIVE
UROBILINOGEN UA: 2 — AB (ref 0.0–1.0)

## 2017-09-13 LAB — BASIC METABOLIC PANEL
BUN: 9 mg/dL (ref 6–23)
CALCIUM: 9.3 mg/dL (ref 8.4–10.5)
CO2: 31 mEq/L (ref 19–32)
Chloride: 99 mEq/L (ref 96–112)
Creatinine, Ser: 0.72 mg/dL (ref 0.40–1.20)
GFR: 85.2 mL/min (ref >60.00)
Glucose, Bld: 116 mg/dL — ABNORMAL HIGH (ref 70–99)
Potassium: 4.1 mEq/L (ref 3.5–5.1)
Sodium: 137 mEq/L (ref 135–145)

## 2017-09-13 LAB — HEPATIC FUNCTION PANEL
ALBUMIN: 4.1 g/dL (ref 3.5–5.2)
ALK PHOS: 116 U/L (ref 39–117)
ALT: 393 U/L — AB (ref 0–35)
AST: 252 U/L — AB (ref 0–37)
Bilirubin, Direct: 1.9 mg/dL — ABNORMAL HIGH (ref 0.0–0.3)
TOTAL PROTEIN: 6.9 g/dL (ref 6.0–8.3)
Total Bilirubin: 3.3 mg/dL — ABNORMAL HIGH (ref 0.2–1.2)

## 2017-09-13 LAB — LIPASE: Lipase: 5 U/L — ABNORMAL LOW (ref 11.0–59.0)

## 2017-09-13 LAB — AMYLASE: Amylase: <10 U/L — ABNORMAL LOW (ref 27–131)

## 2017-09-13 NOTE — Progress Notes (Signed)
Chart and office note reviewed in detail  > agree with a/p as outlined    

## 2017-09-13 NOTE — Assessment & Plan Note (Signed)
Epigastric pain x 2 days - associated with food intake  Neg cardiac workup per ER . Records requested.  Check labs today with CBC , LFT and amylase/lipase  Cont on PPI/Pepcid  GI referral  Clear liquid diet and advance as tolerated.   Plan  Patient Instructions  Labs today .  Hold Naprosyn . No NSAIDS .  Clear liquid diet , advance as tolerated.  Continue on Protonix Twice daily   Continue on Pepcid 20mg  At bedtime  .  Refer to GI .  Follow up with Dr. Melvyn Novas  As planned and As needed   Please contact office for sooner follow up if symptoms do not improve or worsen or seek emergency care

## 2017-09-13 NOTE — Patient Instructions (Signed)
Labs today .  Hold Naprosyn . No NSAIDS .  Clear liquid diet , advance as tolerated.  Continue on Protonix Twice daily   Continue on Pepcid 20mg  At bedtime  .  Refer to GI .  Follow up with Dr. Melvyn Novas  As planned and As needed   Please contact office for sooner follow up if symptoms do not improve or worsen or seek emergency care

## 2017-09-13 NOTE — Progress Notes (Signed)
@Patient  ID: [name], female    DOB: 1945/09/19, 72 y.o.   MRN: [id number]  Chief Complaint  Patient presents with  . Acute Visit    Referring provider: Tanda Rockers, MD  HPI: 72 year old female never smoker followed for morbid obesity, gastroesophageal reflux disorder, obstructive sleep apnea and chronic rhinitis, hypertension.  09/13/2017 Acute OV : Chest /Epigastric pain  Patient presents for an acute office visit.  Patient says that yesterday she was on her way back from a beach trip.  She developed chest pain and epigastric pain.  She was seen at an emergency room and underwent a workup.  She says the EKG was told to her that was normal.  Said CXR was normal . Troponin was reported was normal . She was given GI coctail and some improvement .  Unfortunately these results are not available.  Records requested.  Says pain came back last night when she ate. She did go to the emergency room at [facility] however did not stay or get any workup.  No pain today , she has not ate anything because pain is much worse with eating.  No vomiting + nausea , no urinary sx or back pain. No hemoptysis , no fever ,  No dyspnea . S/p cholecystectomy .  Takes naprosyn , last taken 1 week ago.   Allergies  Allergen Reactions  . Epinephrine     REACTION: tachycardia    Immunization History  Administered Date(s) Administered  . Influenza Split 05/24/2013, 05/10/2015, 05/23/2016  . Influenza Whole 06/24/2009, 05/24/2010, 05/25/2011, 05/24/2012, 05/17/2017  . Influenza,inj,Quad PF,6+ Mos 06/07/2014  . Pneumococcal Conjugate-13 04/27/2014  . Pneumococcal Polysaccharide-23 05/24/2008, 05/17/2012  . Td 05/23/2009    Past Medical History:  Diagnosis Date  . Allergic rhinitis   . Allergy   . Anxiety   . Anxiety and depression   . Arthritis   . Asthma    years ago-1993-94  . Barrett's esophagus 05/2005  . Blood transfusion without reported diagnosis    as baby  . Cataract    bilateral removal  . Depression   . Diverticulosis   . Esophageal stricture   . Gastric polyps   . GERD (gastroesophageal reflux disease)   . Hiatal hernia   . Hypertension    on meds  . Morbid obesity ([place])   . OSA (obstructive sleep apnea)   . Osteoporosis   . Positive PPD   . Sleep apnea    bi pap    Tobacco History: Social History   Tobacco Use  Smoking Status Never Smoker  Smokeless Tobacco Never Used  Tobacco Comment   pt has never smoked but husband smoked   Counseling given: Not Answered Comment: pt has never smoked but husband smoked   Outpatient Encounter Medications as of 09/13/2017  Medication Sig  . acetaminophen (TYLENOL) 325 MG tablet Take 650 mg by mouth every 6 (six) hours as needed. For pain  . ALPRAZolam (XANAX) 0.25 MG tablet Take 0.125 mg by mouth 3 (three) times daily as needed. For anxiety  . buPROPion (WELLBUTRIN XL) 300 MG 24 hr tablet Take 300 mg by mouth daily.  . Calcium Carbonate-Vitamin D 600-400 MG-UNIT per tablet Take 1 tablet by mouth daily.    [name] dicyclomine (BENTYL) 10 MG capsule Take 1 capsule (10 mg total) by mouth 3 (three) times daily as needed for spasms.  . DULoxetine (CYMBALTA) 60 MG capsule Take 60 mg by mouth daily.  . famotidine (PEPCID) 20 MG tablet  Take 20 mg by mouth at bedtime as needed. For allergies  . fluticasone (FLONASE) 50 MCG/ACT nasal spray PLACE 2 SPRAYS INTO BOTH NOSTRILS DAILY  . mirabegron ER (MYRBETRIQ) 25 MG TB24 tablet Take 25 mg by mouth daily.  . Multiple Vitamin (MULTIVITAMIN WITH MINERALS) TABS tablet Take 1 tablet by mouth daily.  . naproxen (NAPROSYN) 500 MG tablet Take 1 tablet by mouth 2 (two) times daily as needed.   . pantoprazole (PROTONIX) 40 MG tablet TAKE 1 TABLET BY MOUTH TWICE DAILY. TAKE1 TABLET EVERY 30 TO 60 MINUTES BEFORE FIRST AND LAST MEALS OF THE DAY  . [DISCONTINUED] Ascorbic Acid (VITAMIN C) 500 MG tablet Take 500 mg by mouth daily.    . [DISCONTINUED] b complex vitamins tablet Take 1  tablet by mouth daily.   . [DISCONTINUED] valsartan-hydrochlorothiazide (DIOVAN-HCT) 160-25 MG tablet Take 1 tablet by mouth daily. (Patient not taking: Reported on 09/13/2017)   No facility-administered encounter medications on file as of 09/13/2017.      Review of Systems  Constitutional:   No  weight loss, night sweats,  Fevers, chills, fatigue, or  lassitude.  HEENT:   No headaches,  Difficulty swallowing,  Tooth/dental problems, or  Sore throat,                No sneezing, itching, ear ache, nasal congestion, post nasal drip,   CV:  No chest pain,  Orthopnea, PND, swelling in lower extremities, anasarca, dizziness, palpitations, syncope.   GI  + heartburn, indigestion, abdominal pain, nausea,  No vomiting, diarrhea, change in bowel habits, loss of appetite, bloody stools.   Resp: No shortness of breath with exertion or at rest.  No excess mucus, no productive cough,  No non-productive cough,  No coughing up of blood.  No change in color of mucus.  No wheezing.  No chest wall deformity  Skin: no rash or lesions.  GU: no dysuria, change in color of urine, no urgency or frequency.  No flank pain, no hematuria   MS:  No joint pain or swelling.  No decreased range of motion.  No back pain.    Physical Exam  BP 136/68 (BP Location: Left Arm, Cuff Size: Normal)   Pulse 71   Ht 5\' 2"  (1.575 m)   Wt 200 lb (90.7 kg)   SpO2 99%   BMI 36.58 kg/m   GEN: A/Ox3; pleasant , NAD, well nourished    HEENT:  [place]/AT,  EACs-clear, TMs-wnl, NOSE-clear, THROAT-clear, no lesions, no postnasal drip or exudate noted.   NECK:  Supple w/ fair ROM; no JVD; normal carotid impulses w/o bruits; no thyromegaly or nodules palpated; no lymphadenopathy.    RESP  Clear  P & A; w/o, wheezes/ rales/ or rhonchi. no accessory muscle use, no dullness to percussion  CARD:  RRR, no m/r/g, no peripheral edema, pulses intact, no cyanosis or clubbing.  GI:   Soft & nt; nml bowel sounds; no organomegaly or masses  detected. No guarding or rebound .   Musco: Warm bil, no deformities or joint swelling noted.   Neuro: alert, no focal deficits noted.    Skin: Warm, no lesions or rashes    Lab Results:  CBC   BNP No results found for: BNP  ProBNP   Imaging: No results found.   Assessment & Plan:   Epigastric pain Epigastric pain x 2 days - associated with food intake  Neg cardiac workup per ER . Records requested.  Check labs today with CBC ,  LFT and amylase/lipase  Cont on PPI/Pepcid  GI referral  Clear liquid diet and advance as tolerated.   Plan  Patient Instructions  Labs today .  Hold Naprosyn . No NSAIDS .  Clear liquid diet , advance as tolerated.  Continue on Protonix Twice daily   Continue on Pepcid 20mg  At bedtime  .  Refer to GI .  Follow up with Dr. Melvyn Novas  As planned and As needed   Please contact office for sooner follow up if symptoms do not improve or worsen or seek emergency care           Rexene Edison, NP 09/13/2017

## 2017-09-13 NOTE — Addendum Note (Signed)
Addended by: Len Blalock on: 09/13/2017 02:33 PM   Modules accepted: Orders

## 2017-09-14 ENCOUNTER — Encounter: Payer: Self-pay | Admitting: Gastroenterology

## 2017-09-14 ENCOUNTER — Ambulatory Visit (INDEPENDENT_AMBULATORY_CARE_PROVIDER_SITE_OTHER): Payer: Commercial Managed Care - PPO | Admitting: Gastroenterology

## 2017-09-14 ENCOUNTER — Other Ambulatory Visit (INDEPENDENT_AMBULATORY_CARE_PROVIDER_SITE_OTHER): Payer: Commercial Managed Care - PPO

## 2017-09-14 VITALS — BP 108/62 | HR 86 | Ht 62.0 in | Wt 198.2 lb

## 2017-09-14 DIAGNOSIS — R945 Abnormal results of liver function studies: Secondary | ICD-10-CM

## 2017-09-14 DIAGNOSIS — R7989 Other specified abnormal findings of blood chemistry: Secondary | ICD-10-CM | POA: Insufficient documentation

## 2017-09-14 DIAGNOSIS — R079 Chest pain, unspecified: Secondary | ICD-10-CM | POA: Diagnosis not present

## 2017-09-14 LAB — CBC WITH DIFFERENTIAL/PLATELET
BASOS PCT: 0.5 % (ref 0.0–3.0)
Basophils Absolute: 0 10*3/uL (ref 0.0–0.1)
EOS PCT: 1.8 % (ref 0.0–5.0)
Eosinophils Absolute: 0.2 10*3/uL (ref 0.0–0.7)
HCT: 39.3 % (ref 36.0–46.0)
Hemoglobin: 13.1 g/dL (ref 12.0–15.0)
LYMPHS ABS: 1 10*3/uL (ref 0.7–4.0)
Lymphocytes Relative: 12 % (ref 12.0–46.0)
MCHC: 33.3 g/dL (ref 30.0–36.0)
MCV: 90.4 fl (ref 78.0–100.0)
MONO ABS: 0.8 10*3/uL (ref 0.1–1.0)
Monocytes Relative: 9.3 % (ref 3.0–12.0)
NEUTROS PCT: 76.4 % (ref 43.0–77.0)
Neutro Abs: 6.5 10*3/uL (ref 1.4–7.7)
Platelets: 257 10*3/uL (ref 150.0–400.0)
RBC: 4.35 Mil/uL (ref 3.87–5.11)
RDW: 14.8 % (ref 11.5–15.5)
WBC: 8.5 10*3/uL (ref 4.0–10.5)

## 2017-09-14 LAB — HEPATIC FUNCTION PANEL
ALT: 235 U/L — ABNORMAL HIGH (ref 0–35)
AST: 74 U/L — AB (ref 0–37)
Albumin: 3.9 g/dL (ref 3.5–5.2)
Alkaline Phosphatase: 112 U/L (ref 39–117)
BILIRUBIN DIRECT: 0.5 mg/dL — AB (ref 0.0–0.3)
BILIRUBIN TOTAL: 1.2 mg/dL (ref 0.2–1.2)
Total Protein: 7.1 g/dL (ref 6.0–8.3)

## 2017-09-14 LAB — URINE CULTURE
MICRO NUMBER:: 90085485
SPECIMEN QUALITY:: ADEQUATE

## 2017-09-14 NOTE — Progress Notes (Signed)
   09/14/2017 [name] [id number] 05/25/1946   HISTORY OF PRESENT ILLNESS: This is a pleasant 72-year-old female who is known to Dr. Stark.  She was put on my schedule as an urgent add-on today at the request of Tammy Parrett, NP, for evaluation regarding elevated LFTs and epigastric abdominal pain.  The patient tells me that she is not having any abdominal pain.  She says that she was driving home on Sunday from the beach and had sudden onset of chest pain.  She went to the emergency department at a facility on her way and she says that they did EKG, chest x-ray, troponins, which were all unremarkable.  Her symptoms improved with GI cocktail.  She was seen in the pulmonary office yesterday and had labs performed.  BMP was normal.  CBC showed a leukocytosis of 18,000.  Lipase and amylase are normal.  LFTs were elevated with a total bili of 3.3, AST 252, ALT 393.  Alk phos was normal at 116.  Eight months ago when labs were last drawn LFTs were completely normal and alk phos was actually only 55 at that time.  She tells me that she has had a little bit of discomfort since that time, but has not really eaten much.  She is drinking fluids and tolerating those well.  She denies any other associated symptoms including nausea, vomiting, fever, extreme fatigue or malaise.  Admits to feeling a litle cold, but not necessarily chills per se.  She had a cholecystectomy in the 80s for gallstones.  She is on pantoprazole 40 mg twice daily for quite some time.   Past Medical History:  Diagnosis Date  . Allergic rhinitis   . Allergy   . Anxiety   . Anxiety and depression   . Arthritis   . Asthma    years ago-1993-94  . Barrett's esophagus 05/2005  . Blood transfusion without reported diagnosis    as baby  . Cataract    bilateral removal  . Depression   . Diverticulosis   . Esophageal stricture   . Gastric polyps   . GERD (gastroesophageal reflux disease)   . Hiatal hernia   . Hypertension    on meds  . Morbid obesity ([facility])   . OSA (obstructive sleep apnea)   . Osteoporosis   . Positive PPD   . Sleep apnea    bi pap   Past Surgical History:  Procedure Laterality Date  . CESAREAN SECTION     x 2  . CHOLECYSTECTOMY    . COLONOSCOPY    . FINGER SURGERY     cyst removel from right index finger  . FOOT FRACTURE SURGERY     right   . FOOT NEUROMA SURGERY     left   . TONSILLECTOMY    . TUBAL LIGATION  1981  . UPPER GASTROINTESTINAL ENDOSCOPY    . WRIST FRACTURE SURGERY     right     reports that  has never smoked. she has never used smokeless tobacco. She reports that she does not drink alcohol or use drugs. family history includes Allergies in her mother; Alzheimer's disease in her mother; Colon cancer in her maternal uncle; Coronary artery disease in her father; Diabetes in her son; Heart attack in her paternal grandmother; Heart failure in her father and mother; Hypertension in her brother; Lung cancer in her paternal grandfather; Sleep apnea in her brother and son. Allergies  Allergen Reactions  . Epinephrine       REACTION: tachycardia      Outpatient Encounter Medications as of 09/14/2017  Medication Sig  . acetaminophen (TYLENOL) 325 MG tablet Take 650 mg by mouth every 6 (six) hours as needed. For pain  . ALPRAZolam (XANAX) 0.25 MG tablet Take 0.125 mg by mouth 3 (three) times daily as needed. For anxiety  . buPROPion (WELLBUTRIN XL) 300 MG 24 hr tablet Take 300 mg by mouth daily.  . Calcium Carbonate-Vitamin D 600-400 MG-UNIT per tablet Take 1 tablet by mouth daily.    . dicyclomine (BENTYL) 10 MG capsule Take 1 capsule (10 mg total) by mouth 3 (three) times daily as needed for spasms.  . DULoxetine (CYMBALTA) 60 MG capsule Take 60 mg by mouth daily.  . famotidine (PEPCID) 20 MG tablet Take 20 mg by mouth at bedtime as needed. For allergies  . fluticasone (FLONASE) 50 MCG/ACT nasal spray PLACE 2 SPRAYS INTO BOTH NOSTRILS DAILY  . mirabegron ER (MYRBETRIQ) 25  MG TB24 tablet Take 25 mg by mouth daily.  . Multiple Vitamin (MULTIVITAMIN WITH MINERALS) TABS tablet Take 1 tablet by mouth daily.  . pantoprazole (PROTONIX) 40 MG tablet TAKE 1 TABLET BY MOUTH TWICE DAILY. TAKE1 TABLET EVERY 30 TO 60 MINUTES BEFORE FIRST AND LAST MEALS OF THE DAY  . [DISCONTINUED] naproxen (NAPROSYN) 500 MG tablet Take 1 tablet by mouth 2 (two) times daily as needed.    No facility-administered encounter medications on file as of 09/14/2017.      REVIEW OF SYSTEMS  : All other systems reviewed and negative except where noted in the History of Present Illness.   PHYSICAL EXAM: BP 108/62   Pulse 86   Ht 5' 2" (1.575 m)   Wt 198 lb 3.2 oz (89.9 kg)   BMI 36.25 kg/m  General: Well developed white female in no acute distress Head: Normocephalic and atraumatic Eyes:  Sclerae anicteric, conjunctiva pink. Ears: Normal auditory acuity Lungs: Clear throughout to auscultation; no increased WOB. Heart: Regular rate and rhythm; no M/R/G. Abdomen: Soft, non-distended.  BS present.  Minimal epigastric TTP. Musculoskeletal: Symmetrical with no gross deformities  Skin: No lesions on visible extremities Extremities: No edema  Neurological: Alert oriented x 4, grossly non-focal Psychological:  Alert and cooperative. Normal mood and affect  ASSESSMENT AND PLAN: *72 year old female with complaints of chest pain vs epigastric abdominal pain who was found to have elevated LFT's.  No other associated symptoms.  Cardiac evaluation in the ED was negative.  Symptoms improved with GI cocktail. AST, ALT, and total bili elevated, which is new.  Has cholecystectomy in the 80's for gallstones.  Also had a leukocytosis.  Will recheck CBC and hepatic function panel today as well as acute viral hepatitis studies.  Will check RUQ abdominal ultrasound.   CC:  [name], Michael B, MD   

## 2017-09-14 NOTE — H&P (View-Only) (Signed)
09/14/2017 [name] [id number] [birth year]   HISTORY OF PRESENT ILLNESS: This is a pleasant 72 year old female who is known to Dr. Fuller Plan.  She was put on my schedule as an urgent add-on today at the request of Rexene Edison, NP, for evaluation regarding elevated LFTs and epigastric abdominal pain.  The patient tells me that she is not having any abdominal pain.  She says that she was driving home on Sunday from the beach and had sudden onset of chest pain.  She went to the emergency department at a facility on her way and she says that they did EKG, chest x-ray, troponins, which were all unremarkable.  Her symptoms improved with GI cocktail.  She was seen in the pulmonary office yesterday and had labs performed.  BMP was normal.  CBC showed a leukocytosis of 18,000.  Lipase and amylase are normal.  LFTs were elevated with a total bili of 3.3, AST 252, ALT 393.  Alk phos was normal at 116.  Eight months ago when labs were last drawn LFTs were completely normal and alk phos was actually only 55 at that time.  She tells me that she has had a little bit of discomfort since that time, but has not really eaten much.  She is drinking fluids and tolerating those well.  She denies any other associated symptoms including nausea, vomiting, fever, extreme fatigue or malaise.  Admits to feeling a litle cold, but not necessarily chills per se.  She had a cholecystectomy in the 80s for gallstones.  She is on pantoprazole 40 mg twice daily for quite some time.   Past Medical History:  Diagnosis Date  . Allergic rhinitis   . Allergy   . Anxiety   . Anxiety and depression   . Arthritis   . Asthma    years ago-1993-94  . Barrett's esophagus 05/2005  . Blood transfusion without reported diagnosis    as baby  . Cataract    bilateral removal  . Depression   . Diverticulosis   . Esophageal stricture   . Gastric polyps   . GERD (gastroesophageal reflux disease)   . Hiatal hernia   . Hypertension    on meds  . Morbid obesity ([place])   . OSA (obstructive sleep apnea)   . Osteoporosis   . Positive PPD   . Sleep apnea    bi pap   Past Surgical History:  Procedure Laterality Date  . CESAREAN SECTION     x 2  . CHOLECYSTECTOMY    . COLONOSCOPY    . FINGER SURGERY     cyst removel from right index finger  . FOOT FRACTURE SURGERY     right   . FOOT NEUROMA SURGERY     left   . TONSILLECTOMY    . TUBAL LIGATION  1981  . UPPER GASTROINTESTINAL ENDOSCOPY    . WRIST FRACTURE SURGERY     right     reports that  has never smoked. she has never used smokeless tobacco. She reports that she does not drink alcohol or use drugs. family history includes Allergies in her mother; Alzheimer's disease in her mother; Colon cancer in her maternal uncle; Coronary artery disease in her father; Diabetes in her son; Heart attack in her paternal grandmother; Heart failure in her father and mother; Hypertension in her brother; Lung cancer in her paternal grandfather; Sleep apnea in her brother and son. Allergies  Allergen Reactions  . Epinephrine  REACTION: tachycardia      Outpatient Encounter Medications as of 09/14/2017  Medication Sig  . acetaminophen (TYLENOL) 325 MG tablet Take 650 mg by mouth every 6 (six) hours as needed. For pain  . ALPRAZolam (XANAX) 0.25 MG tablet Take 0.125 mg by mouth 3 (three) times daily as needed. For anxiety  . buPROPion (WELLBUTRIN XL) 300 MG 24 hr tablet Take 300 mg by mouth daily.  . Calcium Carbonate-Vitamin D 600-400 MG-UNIT per tablet Take 1 tablet by mouth daily.    [name] dicyclomine (BENTYL) 10 MG capsule Take 1 capsule (10 mg total) by mouth 3 (three) times daily as needed for spasms.  . DULoxetine (CYMBALTA) 60 MG capsule Take 60 mg by mouth daily.  . famotidine (PEPCID) 20 MG tablet Take 20 mg by mouth at bedtime as needed. For allergies  . fluticasone (FLONASE) 50 MCG/ACT nasal spray PLACE 2 SPRAYS INTO BOTH NOSTRILS DAILY  . mirabegron ER (MYRBETRIQ) 25  MG TB24 tablet Take 25 mg by mouth daily.  . Multiple Vitamin (MULTIVITAMIN WITH MINERALS) TABS tablet Take 1 tablet by mouth daily.  . pantoprazole (PROTONIX) 40 MG tablet TAKE 1 TABLET BY MOUTH TWICE DAILY. TAKE1 TABLET EVERY 30 TO 60 MINUTES BEFORE FIRST AND LAST MEALS OF THE DAY  . [DISCONTINUED] naproxen (NAPROSYN) 500 MG tablet Take 1 tablet by mouth 2 (two) times daily as needed.    No facility-administered encounter medications on file as of 09/14/2017.      REVIEW OF SYSTEMS  : All other systems reviewed and negative except where noted in the History of Present Illness.   PHYSICAL EXAM: BP 108/62   Pulse 86   Ht _0  (1.575 m)   Wt 198 lb 3.2 oz (89.9 kg)   BMI 36.25 kg/m  General: Well developed white female in no acute distress Head: Normocephalic and atraumatic Eyes:  Sclerae anicteric, conjunctiva pink. Ears: Normal auditory acuity Lungs: Clear throughout to auscultation; no increased WOB. Heart: Regular rate and rhythm; no M/R/G. Abdomen: Soft, non-distended.  BS present.  Minimal epigastric TTP. Musculoskeletal: Symmetrical with no gross deformities  Skin: No lesions on visible extremities Extremities: No edema  Neurological: Alert oriented x 4, grossly non-focal Psychological:  Alert and cooperative. Normal mood and affect  ASSESSMENT AND PLAN: *72 year old female with complaints of chest pain vs epigastric abdominal pain who was found to have elevated LFT's.  No other associated symptoms.  Cardiac evaluation in the ED was negative.  Symptoms improved with GI cocktail. AST, ALT, and total bili elevated, which is new.  Has cholecystectomy in the 80's for gallstones.  Also had a leukocytosis.  Will recheck CBC and hepatic function panel today as well as acute viral hepatitis studies.  Will check RUQ abdominal ultrasound.   CC:  Tanda Rockers, MD

## 2017-09-14 NOTE — Patient Instructions (Signed)
Your physician has requested that you go to the basement for the following lab work before leaving today.  Please fax the results of your ultrasound to [phone] attn: Alonza Bogus, PA-C

## 2017-09-14 NOTE — Progress Notes (Signed)
Reviewed and agree with initial management plan.  Everest Hacking T. Travell Desaulniers, MD FACG 

## 2017-09-15 ENCOUNTER — Other Ambulatory Visit: Payer: Self-pay | Admitting: Adult Health

## 2017-09-15 DIAGNOSIS — R109 Unspecified abdominal pain: Secondary | ICD-10-CM

## 2017-09-15 LAB — HEPATITIS PANEL, ACUTE
HEP A IGM: NONREACTIVE
HEP B S AG: NONREACTIVE
Hep B C IgM: NONREACTIVE
Hepatitis C Ab: NONREACTIVE
SIGNAL TO CUT-OFF: 0.05 (ref <1.00)

## 2017-09-15 NOTE — Progress Notes (Signed)
Result Notes for Urine Culture   Notes recorded by Rinaldo Ratel, CMA on 09/15/2017 at 4:34 PM EST Called spoke with patient, she is aware of the above and will come next week for repeat UA and culture Orders have been placed Her ultrasound is scheduled for tomorrow morning ------  Notes recorded by Melvenia Needles, NP on 09/15/2017 at 12:25 PM EST Urine cx ? Contaminant .  Would repeat UA and Urine culture .  Cont to follow up with [facility] [place] as planned  Please contact office for sooner follow up if symptoms do not improve or worsen or seek emergency care

## 2017-09-15 NOTE — Progress Notes (Signed)
Called spoke with patient, she is aware of the above and will come next week for repeat UA and culture Orders have been placed Her ultrasound is scheduled for tomorrow morning

## 2017-09-16 ENCOUNTER — Other Ambulatory Visit: Payer: Self-pay

## 2017-09-16 ENCOUNTER — Telehealth: Payer: Self-pay | Admitting: Gastroenterology

## 2017-09-16 ENCOUNTER — Encounter (HOSPITAL_COMMUNITY): Payer: Self-pay | Admitting: *Deleted

## 2017-09-16 DIAGNOSIS — K8050 Calculus of bile duct without cholangitis or cholecystitis without obstruction: Secondary | ICD-10-CM

## 2017-09-16 NOTE — Telephone Encounter (Signed)
A user error has taken place.

## 2017-09-17 ENCOUNTER — Ambulatory Visit (HOSPITAL_COMMUNITY): Payer: Commercial Managed Care - PPO

## 2017-09-17 ENCOUNTER — Ambulatory Visit (HOSPITAL_COMMUNITY): Payer: Commercial Managed Care - PPO | Admitting: Anesthesiology

## 2017-09-17 ENCOUNTER — Encounter (HOSPITAL_COMMUNITY)
Admission: RE | Disposition: A | Discharge status: Home / Self Care | Payer: Self-pay | Source: Ambulatory Visit | Attending: Gastroenterology

## 2017-09-17 ENCOUNTER — Encounter (HOSPITAL_COMMUNITY): Payer: Self-pay | Admitting: *Deleted

## 2017-09-17 ENCOUNTER — Telehealth: Payer: Self-pay

## 2017-09-17 ENCOUNTER — Ambulatory Visit (HOSPITAL_COMMUNITY)
Admission: RE | Admit: 2017-09-17 | Discharge: 2017-09-17 | Disposition: A | Discharge status: Home / Self Care | Payer: Commercial Managed Care - PPO | Source: Ambulatory Visit | Attending: Gastroenterology | Admitting: Gastroenterology

## 2017-09-17 DIAGNOSIS — K8050 Calculus of bile duct without cholangitis or cholecystitis without obstruction: Secondary | ICD-10-CM

## 2017-09-17 DIAGNOSIS — J45909 Unspecified asthma, uncomplicated: Secondary | ICD-10-CM | POA: Insufficient documentation

## 2017-09-17 DIAGNOSIS — G4733 Obstructive sleep apnea (adult) (pediatric): Secondary | ICD-10-CM | POA: Insufficient documentation

## 2017-09-17 DIAGNOSIS — I10 Essential (primary) hypertension: Secondary | ICD-10-CM | POA: Diagnosis not present

## 2017-09-17 DIAGNOSIS — Z888 Allergy status to other drugs, medicaments and biological substances status: Secondary | ICD-10-CM | POA: Diagnosis not present

## 2017-09-17 DIAGNOSIS — Z79899 Other long term (current) drug therapy: Secondary | ICD-10-CM | POA: Insufficient documentation

## 2017-09-17 DIAGNOSIS — Z9049 Acquired absence of other specified parts of digestive tract: Secondary | ICD-10-CM | POA: Insufficient documentation

## 2017-09-17 DIAGNOSIS — K8051 Calculus of bile duct without cholangitis or cholecystitis with obstruction: Secondary | ICD-10-CM | POA: Diagnosis not present

## 2017-09-17 DIAGNOSIS — K219 Gastro-esophageal reflux disease without esophagitis: Secondary | ICD-10-CM | POA: Insufficient documentation

## 2017-09-17 DIAGNOSIS — F419 Anxiety disorder, unspecified: Secondary | ICD-10-CM | POA: Diagnosis not present

## 2017-09-17 DIAGNOSIS — Z9989 Dependence on other enabling machines and devices: Secondary | ICD-10-CM | POA: Insufficient documentation

## 2017-09-17 DIAGNOSIS — F329 Major depressive disorder, single episode, unspecified: Secondary | ICD-10-CM | POA: Insufficient documentation

## 2017-09-17 DIAGNOSIS — K449 Diaphragmatic hernia without obstruction or gangrene: Secondary | ICD-10-CM | POA: Insufficient documentation

## 2017-09-17 HISTORY — PX: ENDOSCOPIC RETROGRADE CHOLANGIOPANCREATOGRAPHY (ERCP) WITH PROPOFOL: SHX5810

## 2017-09-17 HISTORY — DX: Pneumonia, unspecified organism: J18.9

## 2017-09-17 HISTORY — DX: Malignant (primary) neoplasm, unspecified: C80.1

## 2017-09-17 SURGERY — ENDOSCOPIC RETROGRADE CHOLANGIOPANCREATOGRAPHY (ERCP) WITH PROPOFOL
Anesthesia: General

## 2017-09-17 MED ORDER — CIPROFLOXACIN IN D5W 400 MG/200ML IV SOLN
INTRAVENOUS | Status: AC
  Filled 2017-09-17: qty 200

## 2017-09-17 MED ORDER — GLUCAGON HCL RDNA (DIAGNOSTIC) 1 MG IJ SOLR
INTRAMUSCULAR | Status: DC | PRN
  Administered 2017-09-17 (×4): 0.25 mg via INTRAVENOUS

## 2017-09-17 MED ORDER — GLUCAGON HCL RDNA (DIAGNOSTIC) 1 MG IJ SOLR
INTRAMUSCULAR | Status: AC
  Filled 2017-09-17: qty 1

## 2017-09-17 MED ORDER — IOPAMIDOL (ISOVUE-300) INJECTION 61%
INTRAVENOUS | Status: AC
  Filled 2017-09-17: qty 50

## 2017-09-17 MED ORDER — GLUCAGON HCL RDNA (DIAGNOSTIC) 1 MG IJ SOLR
INTRAMUSCULAR | Status: AC
  Filled 2017-09-17: qty 2

## 2017-09-17 MED ORDER — INDOMETHACIN 50 MG RE SUPP
RECTAL | Status: DC | PRN
  Administered 2017-09-17 (×2): 50 mg via RECTAL

## 2017-09-17 MED ORDER — ONDANSETRON HCL 4 MG/2ML IJ SOLN
INTRAMUSCULAR | Status: DC | PRN
  Administered 2017-09-17: 4 mg via INTRAVENOUS

## 2017-09-17 MED ORDER — ROCURONIUM BROMIDE 100 MG/10ML IV SOLN
INTRAVENOUS | Status: DC | PRN
  Administered 2017-09-17: 50 mg via INTRAVENOUS

## 2017-09-17 MED ORDER — SUGAMMADEX SODIUM 500 MG/5ML IV SOLN
INTRAVENOUS | Status: DC | PRN
  Administered 2017-09-17: 250 mg via INTRAVENOUS

## 2017-09-17 MED ORDER — DEXAMETHASONE SODIUM PHOSPHATE 10 MG/ML IJ SOLN
INTRAMUSCULAR | Status: DC | PRN
  Administered 2017-09-17: 10 mg via INTRAVENOUS

## 2017-09-17 MED ORDER — SODIUM CHLORIDE 0.9 % IV SOLN: INTRAVENOUS | Status: DC

## 2017-09-17 MED ORDER — IOPAMIDOL (ISOVUE-300) INJECTION 61%
INTRAVENOUS | Status: AC
Start: 2017-09-17 — End: 2017-09-17
  Filled 2017-09-17: qty 50

## 2017-09-17 MED ORDER — LACTATED RINGERS IV SOLN
INTRAVENOUS | Status: DC | PRN
  Administered 2017-09-17: 12:00:00 via INTRAVENOUS

## 2017-09-17 MED ORDER — PROPOFOL 10 MG/ML IV BOLUS
INTRAVENOUS | Status: DC | PRN
  Administered 2017-09-17: 150 mg via INTRAVENOUS

## 2017-09-17 MED ORDER — INDOMETHACIN 50 MG RE SUPP
RECTAL | Status: AC
  Filled 2017-09-17: qty 2

## 2017-09-17 MED ORDER — SODIUM CHLORIDE 0.9 % IV SOLN
1.5000 g | INTRAVENOUS | Status: AC
  Administered 2017-09-17: 1.5 g via INTRAVENOUS
  Filled 2017-09-17: qty 1.5

## 2017-09-17 MED ORDER — INDOMETHACIN 50 MG RE SUPP: 100.0000 mg | Freq: Once | RECTAL | Status: DC

## 2017-09-17 MED ORDER — IOPAMIDOL (ISOVUE-M 300) INJECTION 61%
INTRAMUSCULAR | Status: DC | PRN
  Administered 2017-09-17: 28 mg via INTRATHECAL

## 2017-09-17 MED ORDER — LIDOCAINE HCL (CARDIAC) 20 MG/ML IV SOLN
INTRAVENOUS | Status: DC | PRN
  Administered 2017-09-17: 70 mg via INTRATRACHEAL

## 2017-09-17 MED ORDER — FENTANYL CITRATE (PF) 100 MCG/2ML IJ SOLN
INTRAMUSCULAR | Status: DC | PRN
  Administered 2017-09-17: 100 ug via INTRAVENOUS

## 2017-09-17 MED ORDER — PHENYLEPHRINE HCL 10 MG/ML IJ SOLN
INTRAMUSCULAR | Status: DC | PRN
  Administered 2017-09-17 (×2): 80 ug via INTRAVENOUS

## 2017-09-17 NOTE — Anesthesia Postprocedure Evaluation (Signed)
Anesthesia Post Note  Patient: [name]  Procedure(s) Performed: ENDOSCOPIC RETROGRADE CHOLANGIOPANCREATOGRAPHY (ERCP) WITH PROPOFOL (N/A )     Patient location during evaluation: PACU Anesthesia Type: General Level of consciousness: sedated and patient cooperative Pain management: pain level controlled Vital Signs Assessment: post-procedure vital signs reviewed and stable Respiratory status: spontaneous breathing Cardiovascular status: stable Anesthetic complications: no    Last Vitals:  Vitals:   09/17/17 1526 09/17/17 1527  BP:    Pulse:  71  Resp:  15  Temp:    SpO2: 96% 97%    Last Pain:  Vitals:   09/17/17 1455  TempSrc: Oral                 Nolon Nations

## 2017-09-17 NOTE — Op Note (Signed)
[facility] Patient Name: [name] Procedure Date : 09/17/2017 MRN: [id number] Attending MD: Ladene Artist , MD Date of Birth: 1946-03-14 CSN: [id number] Age: 72 Admit Type: Inpatient Procedure:                ERCP Indications:              Abdominal pain of suspected biliary origin, Bile                            duct stone on [place], Dilated bile ducts on [place],                            Elevated liver enzymes Providers:                Pricilla Riffle. Fuller Plan, MD, Burtis Junes, RN, Alan Mulder,                            Technician, Virgilio Belling. Huel Cote, CRNA Referring MD:             Christena Deem. Melvyn Novas, MD Medicines:                General Anesthesia Complications:            No immediate complications. Estimated Blood Loss:     Estimated blood loss: none. Procedure:                Pre-Anesthesia Assessment:                           - Prior to the procedure, a History and Physical                            was performed, and patient medications and                            allergies were reviewed. The patient's tolerance of                            previous anesthesia was also reviewed. The risks                            and benefits of the procedure and the sedation                            options and risks were discussed with the patient.                            All questions were answered, and informed consent                            was obtained. Prior Anticoagulants: The patient has                            taken no previous anticoagulant or antiplatelet  agents. ASA Grade Assessment: II - A patient with                            mild systemic disease. After reviewing the risks                            and benefits, the patient was deemed in                            satisfactory condition to undergo the procedure.                           After obtaining informed consent, the scope was                            passed  under direct vision. Throughout the                            procedure, the patient's blood pressure, pulse, and                            oxygen saturations were monitored continuously. The                            [id number] scope was introduced through the                            mouth, and used to inject contrast into and used to                            inject contrast into the bile duct and dorsal                            pancreatic duct. The ERCP was accomplished without                            difficulty. The patient tolerated the procedure                            well. Scope In: Scope Out: Findings:      A scout film of the abdomen was obtained. Surgical clips, consistent       with a previous cholecystectomy, were seen in the area of the right       upper quadrant of the abdomen. The esophagus was successfully intubated       under direct vision. The scope was advanced to a normal major papilla in       the descending duodenum without detailed examination of the pharynx,       larynx and associated structures, and upper GI tract. The major papilla       located inside a large periampullary diverticulum and the papilla was       facing toward the middle of the diverticulum instead of toward the       duodenal lumen. It was challenging to cannulate and maintain position.  It required the scope to be in the semilong position. The upper GI tract       was otherwise grossly normal. A straight Roadrunner wire was passed into       the PD several times and a partial pancreatogram was obtained to help       with location of the CBD. The partial pancreatogram appeared normal. The       straight Roadrunner wire was then passed into the biliary tree. The       short-nosed traction sphincterotome was passed over the guidewire and       the bile duct was then deeply cannulated. Contrast was injected. I       personally interpreted the bile duct and pancreatic  duct images. There       was appropriate flow of contrast through the ducts. The common bile duct       and intrathepatic ducts were diffusely dilated, with a stone causing an       obstruction. The largest CBD diameter was 12 mm. A cholecystectomy had       been performed. The lower third of the main bile duct contained an 18 mm       filling defect thought to be a stone. A 7 mm biliary sphincterotomy was       made with a traction (standard) sphincterotome using ERBE       electrocautery. I could not safely extend the spincterotomy within the       diverticulum. The stone was too large to remove through the 7 mm       sphincterotomy. Positioning continued to be challenging. I was not       confident that I could maintain position, maintain access to the CBD and       complete lithotripsy and clear the CBD. There was no post-sphincterotomy       bleeding. One 8.5 Fr by 5 cm plastic stent with a single external flap       and a single internal flap was placed 4 cm into the common bile duct.       Bile flowed through the stent. The stent was in good position. Impression:               - A filling defect consistent with a stone was seen                            on the cholangiogram.                           - The common bile duct was dilated, with a stone                            causing an obstruction.                           - The patient has had a cholecystectomy.                           - The examination was suspicious for                            choledocholithiasis. Removal by biliary  sphincterotomy was not accomplished; a stent was                            inserted.                           - A biliary sphincterotomy was performed.                           - One plastic stent was placed into the common bile                            duct.                           - Partial pancreatogram appeared normal. Recommendation:           - Avoid  aspirin and nonsteroidal anti-inflammatory                            medicines for 1 week.                           - Observe patient's clinical course.                           - Discuss referral to a tertiary biliary                            endoscopist for stone removal, stent removal at the                            next available appointment.                           - Clear liquid diet today for 3 hours, then advance                            as tolerated to a fat modified, soft diet later                            today. Procedure Code(s):        --- Professional ---                           [phone], Endoscopic retrograde                            cholangiopancreatography (ERCP); with placement of                            endoscopic stent into biliary or pancreatic duct,                            including pre- and post-dilation and guide wire  passage, when performed, including sphincterotomy,                            when performed, each stent Diagnosis Code(s):        --- Professional ---                           K80.51, Calculus of bile duct without cholangitis                            or cholecystitis with obstruction                           Z90.49, Acquired absence of other specified parts                            of digestive tract                           R10.9, Unspecified abdominal pain                           R74.8, Abnormal levels of other serum enzymes                           R93.2, Abnormal findings on diagnostic imaging of                            liver and biliary tract CPT copyright 2016 American Medical Association. All rights reserved. The codes documented in this report are preliminary and upon coder review may  be revised to meet current compliance requirements. Ladene Artist, MD 09/17/2017 2:54:25 PM This report has been signed electronically. Number of Addenda: 0

## 2017-09-17 NOTE — Telephone Encounter (Signed)
Patient referral faxed to [facility] on [phone]

## 2017-09-17 NOTE — Anesthesia Preprocedure Evaluation (Signed)
Anesthesia Evaluation  Patient identified by MRN, date of birth, ID band Patient awake    Reviewed: Allergy & Precautions, NPO status , Patient's Chart, lab work & pertinent test results  Airway Mallampati: III  TM Distance: >3 FB Neck ROM: Full    Dental  (+) Missing, Caps, Chipped   Pulmonary asthma , sleep apnea , pneumonia,    Pulmonary exam normal breath sounds clear to auscultation       Cardiovascular hypertension, Pt. on medications Normal cardiovascular exam Rhythm:Regular Rate:Normal     Neuro/Psych PSYCHIATRIC DISORDERS Anxiety Depression  Neuromuscular disease negative neurological ROS  negative psych ROS   GI/Hepatic Neg liver ROS, hiatal hernia, GERD  ,  Endo/Other  negative endocrine ROS  Renal/GU negative Renal ROS     Musculoskeletal  (+) Arthritis ,   Abdominal   Peds  Hematology negative hematology ROS (+) DOES NOT REFUSE BLOOD PRODUCTS,   Anesthesia Other Findings   Reproductive/Obstetrics negative OB ROS                            Anesthesia Physical Anesthesia Plan  ASA: III  Anesthesia Plan: General   Post-op Pain Management:    Induction: Intravenous  PONV Risk Score and Plan: 3 and Ondansetron, Dexamethasone and Treatment may vary due to age or medical condition  Airway Management Planned: Oral ETT  Additional Equipment:   Intra-op Plan:   Post-operative Plan: Extubation in OR  Informed Consent: I have reviewed the patients History and Physical, chart, labs and discussed the procedure including the risks, benefits and alternatives for the proposed anesthesia with the patient or authorized representative who has indicated his/her understanding and acceptance.   Dental advisory given  Plan Discussed with: CRNA  Anesthesia Plan Comments:         Anesthesia Quick Evaluation

## 2017-09-17 NOTE — Interval H&P Note (Signed)
History and Physical Interval Note:  09/17/2017 1:12 PM  [name]  has presented today for surgery, with the diagnosis of CBD stone  The various methods of treatment have been discussed with the patient and family. After consideration of risks, benefits and other options for treatment, the patient has consented to  Procedure(s): ENDOSCOPIC RETROGRADE CHOLANGIOPANCREATOGRAPHY (ERCP) WITH PROPOFOL (N/A) as a surgical intervention .  The patient's history has been reviewed, patient examined, no change in status, stable for surgery.  I have reviewed the patient's chart and labs.  Questions were answered to the patient's satisfaction.     Pricilla Riffle. Fuller Plan

## 2017-09-17 NOTE — Discharge Instructions (Signed)
YOU HAD AN ENDOSCOPIC PROCEDURE TODAY: Refer to the procedure report and other information in the discharge instructions given to you for any specific questions about what was found during the examination. If this information does not answer your questions, please call [place] office at [phone] to clarify.   YOU SHOULD EXPECT: Some feelings of bloating in the abdomen. Passage of more gas than usual. Walking can help get rid of the air that was put into your GI tract during the procedure and reduce the bloating. If you had a lower endoscopy (such as a colonoscopy or flexible sigmoidoscopy) you may notice spotting of blood in your stool or on the toilet paper. Some abdominal soreness may be present for a day or two, also.  DIET: Your first meal following the procedure should be a light meal and then it is ok to progress to your normal diet. A half-sandwich or bowl of soup is an example of a good first meal. Heavy or fried foods are harder to digest and may make you feel nauseous or bloated. Drink plenty of fluids but you should avoid alcoholic beverages for 24 hours. If you had a esophageal dilation, please see attached instructions for diet.    ACTIVITY: Your care partner should take you home directly after the procedure. You should plan to take it easy, moving slowly for the rest of the day. You can resume normal activity the day after the procedure however YOU SHOULD NOT DRIVE, use power tools, machinery or perform tasks that involve climbing or major physical exertion for 24 hours (because of the sedation medicines used during the test).   SYMPTOMS TO REPORT IMMEDIATELY: A gastroenterologist can be reached at any hour. Please call [phone]  for any of the following symptoms:   Following upper endoscopy (EGD, EUS, ERCP, esophageal dilation) Vomiting of blood or coffee ground material  New, significant abdominal pain  New, significant chest pain or pain under the shoulder blades  Painful or  persistently difficult swallowing  New shortness of breath  Black, tarry-looking or red, bloody stools  FOLLOW UP:  If any biopsies were taken you will be contacted by phone or by letter within the next 1-3 weeks. Call [phone]  if you have not heard about the biopsies in 3 weeks.  Please also call with any specific questions about appointments or follow up tests.  

## 2017-09-17 NOTE — Anesthesia Procedure Notes (Signed)
Procedure Name: Intubation Date/Time: 09/17/2017 1:26 PM Performed by: Mariea Clonts, CRNA Pre-anesthesia Checklist: Patient identified, Emergency Drugs available, Suction available and Patient being monitored Patient Re-evaluated:Patient Re-evaluated prior to induction Oxygen Delivery Method: Circle System Utilized Preoxygenation: Pre-oxygenation with 100% oxygen Induction Type: IV induction Ventilation: Mask ventilation without difficulty Laryngoscope Size: Miller and 2 Grade View: Grade II Tube type: Oral Tube size: 7.0 mm Number of attempts: 1 Airway Equipment and Method: Stylet and Oral airway Placement Confirmation: ETT inserted through vocal cords under direct vision,  positive ETCO2 and breath sounds checked- equal and bilateral Tube secured with: Tape Dental Injury: Teeth and Oropharynx as per pre-operative assessment

## 2017-09-17 NOTE — Telephone Encounter (Signed)
-----   Message from Ladene Artist, MD sent at 09/17/2017  3:34 PM EST ----- Please start referral process to [facility] for outpatient ERCP with stone removal.  Unable to remove large CBD stone due to large periampullary diverticulum.  See my ERCP note.  I told the pt you would call her next week.

## 2017-09-17 NOTE — Transfer of Care (Signed)
Immediate Anesthesia Transfer of Care Note  Patient: [name]  Procedure(s) Performed: ENDOSCOPIC RETROGRADE CHOLANGIOPANCREATOGRAPHY (ERCP) WITH PROPOFOL (N/A )  Patient Location: Endoscopy Unit  Anesthesia Type:General  Level of Consciousness: awake, alert  and oriented  Airway & Oxygen Therapy: Patient Spontanous Breathing and Patient connected to nasal cannula oxygen  Post-op Assessment: Report given to RN and Post -op Vital signs reviewed and stable  Post vital signs: Reviewed and stable  Last Vitals:  Vitals:   09/17/17 1136 09/17/17 1455  BP: (!) 174/59 (!) 153/52  Pulse: 66 79  Resp: 14 17  Temp: 36.6 C 36.8 C  SpO2: 96% 98%    Last Pain:  Vitals:   09/17/17 1455  TempSrc: Oral         Complications: No apparent anesthesia complications

## 2017-09-19 ENCOUNTER — Encounter (HOSPITAL_COMMUNITY): Payer: Self-pay | Admitting: Gastroenterology

## 2017-09-20 ENCOUNTER — Ambulatory Visit: Payer: Commercial Managed Care - PPO | Admitting: Gastroenterology

## 2017-09-21 NOTE — Telephone Encounter (Signed)
Patient notified that she should hear from [facility] in approximately a week

## 2017-09-21 NOTE — Telephone Encounter (Signed)
Patient states that she has not heard from [facility] as of yet. [facility] # [phone]

## 2017-09-27 ENCOUNTER — Ambulatory Visit: Payer: Commercial Managed Care - PPO | Admitting: Pulmonary Disease

## 2017-09-27 ENCOUNTER — Telehealth: Payer: Self-pay | Admitting: Gastroenterology

## 2017-09-27 DIAGNOSIS — K838 Other specified diseases of biliary tract: Secondary | ICD-10-CM | POA: Diagnosis not present

## 2017-09-27 DIAGNOSIS — Z9689 Presence of other specified functional implants: Secondary | ICD-10-CM | POA: Diagnosis not present

## 2017-09-27 DIAGNOSIS — K8050 Calculus of bile duct without cholangitis or cholecystitis without obstruction: Secondary | ICD-10-CM | POA: Diagnosis not present

## 2017-09-27 DIAGNOSIS — Z4659 Encounter for fitting and adjustment of other gastrointestinal appliance and device: Secondary | ICD-10-CM | POA: Diagnosis not present

## 2017-09-27 DIAGNOSIS — R932 Abnormal findings on diagnostic imaging of liver and biliary tract: Secondary | ICD-10-CM | POA: Diagnosis not present

## 2017-09-27 DIAGNOSIS — Z79899 Other long term (current) drug therapy: Secondary | ICD-10-CM | POA: Diagnosis not present

## 2017-09-27 DIAGNOSIS — Z85828 Personal history of other malignant neoplasm of skin: Secondary | ICD-10-CM | POA: Diagnosis not present

## 2017-09-27 DIAGNOSIS — K219 Gastro-esophageal reflux disease without esophagitis: Secondary | ICD-10-CM | POA: Diagnosis not present

## 2017-09-27 DIAGNOSIS — I10 Essential (primary) hypertension: Secondary | ICD-10-CM | POA: Diagnosis not present

## 2017-09-27 NOTE — Telephone Encounter (Signed)
Patient notified that she needs LFTs and follow up in one month.  She will come in on 10/29/17 and labs a few days prior.

## 2017-09-27 NOTE — Telephone Encounter (Signed)
Patient states she had her ERCP today at [facility] and wants to know when she needs a follow up with Dr.Stark.

## 2017-10-01 DIAGNOSIS — R351 Nocturia: Secondary | ICD-10-CM | POA: Diagnosis not present

## 2017-10-01 DIAGNOSIS — N139 Obstructive and reflux uropathy, unspecified: Secondary | ICD-10-CM | POA: Diagnosis not present

## 2017-10-01 DIAGNOSIS — N3941 Urge incontinence: Secondary | ICD-10-CM | POA: Diagnosis not present

## 2017-10-01 DIAGNOSIS — N3944 Nocturnal enuresis: Secondary | ICD-10-CM | POA: Diagnosis not present

## 2017-10-07 DIAGNOSIS — M25552 Pain in left hip: Secondary | ICD-10-CM | POA: Diagnosis not present

## 2017-10-26 ENCOUNTER — Other Ambulatory Visit (INDEPENDENT_AMBULATORY_CARE_PROVIDER_SITE_OTHER): Payer: Commercial Managed Care - PPO

## 2017-10-26 DIAGNOSIS — K8050 Calculus of bile duct without cholangitis or cholecystitis without obstruction: Secondary | ICD-10-CM | POA: Diagnosis not present

## 2017-10-26 LAB — HEPATIC FUNCTION PANEL
ALBUMIN: 4 g/dL (ref 3.5–5.2)
ALT: 12 U/L (ref 0–35)
AST: 11 U/L (ref 0–37)
Alkaline Phosphatase: 69 U/L (ref 39–117)
Bilirubin, Direct: 0 mg/dL (ref 0.0–0.3)
TOTAL PROTEIN: 6.8 g/dL (ref 6.0–8.3)
Total Bilirubin: 0.3 mg/dL (ref 0.2–1.2)

## 2017-10-29 ENCOUNTER — Ambulatory Visit: Payer: Commercial Managed Care - PPO | Admitting: Gastroenterology

## 2017-10-30 DIAGNOSIS — M25552 Pain in left hip: Secondary | ICD-10-CM | POA: Diagnosis not present

## 2017-11-01 ENCOUNTER — Ambulatory Visit: Payer: Commercial Managed Care - PPO | Admitting: Pulmonary Disease

## 2017-12-13 ENCOUNTER — Ambulatory Visit (INDEPENDENT_AMBULATORY_CARE_PROVIDER_SITE_OTHER): Payer: Commercial Managed Care - PPO | Admitting: Gastroenterology

## 2017-12-13 ENCOUNTER — Encounter: Payer: Self-pay | Admitting: Gastroenterology

## 2017-12-13 VITALS — BP 146/70 | HR 72 | Ht 62.0 in | Wt 191.1 lb

## 2017-12-13 DIAGNOSIS — K219 Gastro-esophageal reflux disease without esophagitis: Secondary | ICD-10-CM

## 2017-12-13 DIAGNOSIS — K580 Irritable bowel syndrome with diarrhea: Secondary | ICD-10-CM | POA: Diagnosis not present

## 2017-12-13 DIAGNOSIS — R945 Abnormal results of liver function studies: Secondary | ICD-10-CM | POA: Diagnosis not present

## 2017-12-13 DIAGNOSIS — R7989 Other specified abnormal findings of blood chemistry: Secondary | ICD-10-CM

## 2017-12-13 MED ORDER — DICYCLOMINE HCL 10 MG PO CAPS: 10.0000 mg | ORAL_CAPSULE | Freq: Three times a day (TID) | ORAL | 11 refills | Status: DC | PRN

## 2017-12-13 NOTE — Patient Instructions (Signed)
We have sent the following medications to your pharmacy for you to pick up at your convenience: Bentyl.  Thank you for choosing me and [facility].  Malcolm T. Stark, Jr., MD., FACG   

## 2017-12-13 NOTE — Progress Notes (Signed)
    History of Present Illness: This is a 72 year old female returning for follow-up of common bile duct stones, elevated LFTs postprandial abdominal cramping and urgent loose stools.  She underwent ERCP in early February at [facility].  I cannot locate the procedure report however Dr. Harl Bowie called me shortly after the ERCP to report that the common bile duct was cleared of stones and the procedure went smoothly.  Repeat LFTs were normal on 3/5.  She ran out of dicyclomine and has noted a worsening postprandial urgency and diarrhea especially with certain foods.  She has been taking Imodium as needed.  Current Medications, Allergies, Past Medical History, Past Surgical History, Family History and Social History were reviewed in [employer] record.  Physical Exam: General: Well developed, well nourished, no acute distress Head: Normocephalic and atraumatic Eyes:  sclerae anicteric, EOMI Ears: Normal auditory acuity Mouth: No deformity or lesions Lungs: Clear throughout to auscultation Heart: Regular rate and rhythm; no murmurs, rubs or bruits Abdomen: Soft, non tender and non distended. No masses, hepatosplenomegaly or hernias noted. Normal Bowel sounds Musculoskeletal: Symmetrical with no gross deformities  Pulses:  Normal pulses noted Extremities: No clubbing, cyanosis, edema or deformities noted Neurological: Alert oriented x 4, grossly nonfocal Psychological:  Alert and cooperative. Normal mood and affect  Assessment and Recommendations:  1.  Choledocholithiasis with elevated LFTs, resolved following ERCP here and a second ERCP at [facility].    2.  IBS-D. Minimize foods that trigger symptoms.  Dicyclomine 10 mg 3 times daily before meals.  Imodium 3 times daily as needed. REV in 1 year.

## 2017-12-15 ENCOUNTER — Encounter: Payer: Self-pay | Admitting: Acute Care

## 2017-12-15 ENCOUNTER — Ambulatory Visit (INDEPENDENT_AMBULATORY_CARE_PROVIDER_SITE_OTHER): Payer: Commercial Managed Care - PPO | Admitting: Acute Care

## 2017-12-15 VITALS — BP 156/88 | HR 71 | Ht 62.0 in | Wt 190.8 lb

## 2017-12-15 DIAGNOSIS — G4733 Obstructive sleep apnea (adult) (pediatric): Secondary | ICD-10-CM

## 2017-12-15 NOTE — Progress Notes (Signed)
History of Present Illness [name] is a 72 y.o. female with OSA  And OHS.on BiPAP therapy. . She is followed by Dr. [name]. She is also followed by Dr. Melvyn Novas for her non-BiPap issues.   4/24/2019Pt. Presents for follow up.She was last seen by Dr. [name] 04/17/2016. She is on BiPAP therapy for OSA/OHS. We are unable to obtain a download from her machine today.  [facility] is closed , we will request a download in the morning to confirm compliance, use and effectiveness of therapy.. She states she has been wearing her BiPAP most of the time.She wears it every night while she is at home. She did miss 3 nights this month due to a beach trip. She states she can tell when she does not wear her BiPAP, as she gets a headache. She has no issues with mask or pressure settings. She states she feels good. She is more alert during day with no daytime sleepiness. Her device is 70 years old. We will place an order for a new machine.She has lost about 10 pounds this year, which she is very proud of.She denies fever, chest pain, orthopnea or hemoptysis.  Test Results: Download unavailable for 24 2019 Nursing to request download 12/16/2017 a.m. for review   Sleep tests PSG 03/31/02 >> AHI 64 BiPAP titration 12/01/07 >> BiPAP 14/10 cm H2O >> centrals with higher pressures Pressure change 06/12/15 >> BiPAP 12/8 cm H2O ONO with BiPAP 07/07/15 >> test time 7hrs 57 min.  Basal SpO2 91.8%, low SpO2 85%.  Spent 7.1 min with SpO2 < 88%.     CBC Latest Ref Rng & Units 09/14/2017 09/13/2017 02/12/2017  WBC 4.0 - 10.5 K/uL 8.5 17.9(H) 8.5  Hemoglobin 12.0 - 15.0 g/dL 13.1 13.8 13.5  Hematocrit 36.0 - 46.0 % 39.3 42.1 40.6  Platelets 150.0 - 400.0 K/uL 257.0 314.0 349.0    BMP Latest Ref Rng & Units 09/13/2017 12/24/2016 12/24/2015  Glucose 70 - 99 mg/dL 116(H) 95 105(H)  BUN 6 - 23 mg/dL 9 14 10   Creatinine 0.40 - 1.20 mg/dL 0.72 0.71 0.65  Sodium 135 - 145 mEq/L 137 140 140  Potassium 3.5 - 5.1 mEq/L 4.1 3.7  3.9  Chloride 96 - 112 mEq/L 99 102 102  CO2 19 - 32 mEq/L 31 34(H) 32  Calcium 8.4 - 10.5 mg/dL 9.3 9.3 9.5    BNP No results found for: BNP  ProBNP    Component Value Date/Time   PROBNP 17.0 01/11/2012 1606    PFT No results found for: FEV1PRE, FEV1POST, FVCPRE, FVCPOST, TLC, DLCOUNC, PREFEV1FVCRT, PSTFEV1FVCRT  No results found.   Past medical hx Past Medical History:  Diagnosis Date  . Allergic rhinitis   . Allergy   . Anxiety   . Anxiety and depression   . Arthritis   . Asthma    years ago-1993-94  . Barrett's esophagus 05/2005  . Blood transfusion without reported diagnosis    as baby  . Cancer ([place])    Basal cell face  . Cataract    bilateral removal  . Depression   . Diverticulosis   . Esophageal stricture   . Gastric polyps   . GERD (gastroesophageal reflux disease)   . Hiatal hernia   . Hypertension    on meds  . Morbid obesity ([place])   . OSA (obstructive sleep apnea)   . Osteoporosis   . Pneumonia    many years ago  . Positive PPD   . Sleep apnea  bi pap     Social History   Tobacco Use  . Smoking status: Never Smoker  . Smokeless tobacco: Never Used  . Tobacco comment: pt has never smoked but husband smoked  Substance Use Topics  . Alcohol use: No  . Drug use: No    [name] reports that she has never smoked. She has never used smokeless tobacco. She reports that she does not drink alcohol or use drugs.  Tobacco Cessation: Never smoker  Past surgical hx, Family hx, Social hx all reviewed.  Current Outpatient Medications on File Prior to Visit  Medication Sig  . acetaminophen (TYLENOL) 325 MG tablet Take 650 mg by mouth every 6 (six) hours as needed. For pain  . ALPRAZolam (XANAX) 0.25 MG tablet Take 0.125 mg by mouth 3 (three) times daily as needed. For anxiety  . buPROPion (WELLBUTRIN XL) 300 MG 24 hr tablet Take 300 mg by mouth daily.  . Calcium Carbonate-Vitamin D 600-400 MG-UNIT per tablet Take 1 tablet by mouth daily.     [name] dicyclomine (BENTYL) 10 MG capsule Take 1 capsule (10 mg total) by mouth 3 (three) times daily as needed for spasms.  . DULoxetine (CYMBALTA) 60 MG capsule Take 60 mg by mouth daily.  . famotidine (PEPCID) 20 MG tablet Take 20 mg by mouth at bedtime as needed. For allergies  . fluticasone (FLONASE) 50 MCG/ACT nasal spray PLACE 2 SPRAYS INTO BOTH NOSTRILS DAILY (Patient taking differently: Place 2 sprays into both nostrils at bedtime. )  . mirabegron ER (MYRBETRIQ) 50 MG TB24 tablet Take 50 mg by mouth daily.   . Multiple Vitamin (MULTIVITAMIN WITH MINERALS) TABS tablet Take 1 tablet by mouth daily.  . pantoprazole (PROTONIX) 40 MG tablet TAKE 1 TABLET BY MOUTH TWICE DAILY. TAKE1 TABLET EVERY 30 TO 60 MINUTES BEFORE FIRST AND LAST MEALS OF THE DAY   No current facility-administered medications on file prior to visit.      Allergies  Allergen Reactions  . Epinephrine     REACTION: tachycardia    Review Of Systems:  Constitutional:   + intentional  weight loss, no night sweats,  Fevers, chills, fatigue, or  lassitude.  HEENT:   No headaches,  Difficulty swallowing,  Tooth/dental problems, or  Sore throat,                No sneezing, itching, ear ache, nasal congestion, post nasal drip,   CV:  No chest pain,  Orthopnea, PND, swelling in lower extremities, anasarca, dizziness, palpitations, syncope.   GI  No heartburn, indigestion, abdominal pain, nausea, vomiting, diarrhea, change in bowel habits, loss of appetite, bloody stools.   Resp: No shortness of breath with exertion or at rest.  No excess mucus, no productive cough,  No non-productive cough,  No coughing up of blood.  No change in color of mucus.  No wheezing.  No chest wall deformity  Skin: no rash or lesions.  GU: no dysuria, change in color of urine, no urgency or frequency.  No flank pain, no hematuria   MS:  No joint pain or swelling.  No decreased range of motion.  No back pain.  Psych:  No change in mood or affect.  No depression or anxiety.  No memory loss.   Vital Signs BP (!) 156/88 (BP Location: Left Arm, Cuff Size: Normal)   Pulse 71   Ht 5\' 2"  (1.575 m)   Wt 190 lb 12.8 oz (86.5 kg)   SpO2 96%   BMI 34.90  kg/m    Physical Exam:  General- No distress,  A&Ox3, pleasant female ENT: No sinus tenderness, TM clear, pale nasal mucosa, no oral exudate,no post nasal drip, no LAN Cardiac: S1, S2, regular rate and rhythm, no murmur Chest: No wheeze/ rales/ dullness; no accessory muscle use, no nasal flaring, no sternal retractions, breath sounds diminished at bases bilaterally Abd.: Soft Non-tender, nondistended, bowel sounds positive, obese Ext: No clubbing cyanosis, edema Neuro:  normal strength Skin: No rashes, warm and dry Psych: normal mood and behavior   Assessment/Plan  Obstructive sleep apnea Download unavailable 12/15/2017 Patient's device is 72 years old and she is requesting a new BiPAP Patient states she is compliant with use Patient has lost 10 pounds in the last 12 months Plan We will get a down Load from  [facility] tomorrow and have Dr. [name] review it. We will place an order for a new machine. Continue on BiPAP at bedtime, every night.  Goal is to wear for at least 6 hours each night for maximal clinical benefit. Continue to work on weight loss, as the link between excess weight  and sleep apnea is well established.  Do not drive if sleepy. Remember to clean mask, tubing, filter, and reservoir once weekly with soapy water.  Please follow up with PCP about blood pressure Follow up with 1 year with Dr. [name] or Judson Roch NP  Please contact office for sooner follow up if symptoms do not improve or worsen or seek emergency care      Magdalen Spatz, NP 12/15/2017  9:06 PM

## 2017-12-15 NOTE — Patient Instructions (Signed)
It is nice to meet you today. We will get a down Load from  [facility] tomorrow and have Dr. [name] review it. We will place an order for a new machine. Continue on BiPAP at bedtime, every night.  Goal is to wear for at least 6 hours each night for maximal clinical benefit. Continue to work on weight loss, as the link between excess weight  and sleep apnea is well established.  Do not drive if sleepy. Remember to clean mask, tubing, filter, and reservoir once weekly with soapy water.  Follow up with 1 year with Dr. [name] or Judson Roch NP  Please contact office for sooner follow up if symptoms do not improve or worsen or seek emergency care

## 2017-12-15 NOTE — Assessment & Plan Note (Addendum)
Download unavailable 12/15/2017 Patient's device is 72 years old and she is requesting a new BiPAP Patient states she is compliant with use Patient has lost 10 pounds in the last 12 months Plan We will get a down Load from  [facility] tomorrow and have Dr. [name] review it. We will place an order for a new machine. Continue on BiPAP at bedtime, every night.  Goal is to wear for at least 6 hours each night for maximal clinical benefit. Continue to work on weight loss, as the link between excess weight  and sleep apnea is well established.  Do not drive if sleepy. Remember to clean mask, tubing, filter, and reservoir once weekly with soapy water.  Please follow up with PCP about blood pressure Follow up with 1 year with Dr. [name] or Judson Roch NP  Please contact office for sooner follow up if symptoms do not improve or worsen or seek emergency care

## 2017-12-16 ENCOUNTER — Telehealth: Payer: Self-pay | Admitting: Pulmonary Disease

## 2017-12-16 DIAGNOSIS — G4733 Obstructive sleep apnea (adult) (pediatric): Secondary | ICD-10-CM

## 2017-12-16 NOTE — Telephone Encounter (Addendum)
Called and spoke with [name] with [facility] regarding Pt's DL Per notes with [facility], pt did not pick up nor use BIPAP machine in Oct 2018 Pt is using her old CPAP machine per last note with [facility] in Feb 2019 [name] is calling pt to come in for DL in next week and will fax DL over for review  Placed order to [facility] for DL for pt  Routing message to [place] and [facility] for review.

## 2017-12-17 NOTE — Telephone Encounter (Signed)
SG patient is using CPAP machine her old one, and has NOT used BIPAP machine at all. Pt has not picked up a BIPAP machine from [facility] at this time per [place].

## 2017-12-17 NOTE — Progress Notes (Signed)
Reviewed and agree with assessment/plan.   Winola Drum, MD [facility] 08/19/2016, 12:24 PM Pager:  [phone]  

## 2017-12-17 NOTE — Telephone Encounter (Signed)
[name], is  she using a CPAP or BiPAP. Per your note it says CPAP. She is supposed to be using a BiPap. I am fine with the wait for the download, but want to make sure she is on BiPAP. Thanks

## 2017-12-20 NOTE — Telephone Encounter (Signed)
Can you please ask the patient if there is a reason she did not pick up the BiPAP machine? Does she not want to use it or did she not realize she needed to pick it up? Also, do we know why [facility] didn't follow up when she didn't come to pick up the machine? Thanks

## 2017-12-21 NOTE — Telephone Encounter (Signed)
Patient called back and she stated she did refuse a bipap before because of cost but she currently has a bipap. I will call Corene Cornea in the morning to see if he has any updates on this because it is very confusing. Pt agreed and we will follow up tomorrow.

## 2017-12-21 NOTE — Telephone Encounter (Signed)
Left message for Kathelene to call back.   Spoke with [place] at [facility]. He stated that per his records, [facility] had been trying to get in contact with the patient since August 2017. Patient told [facility] that she wanted to hold off on the bipap machine for now due to cost. [facility] reached out the patient numerous time and she did not return their calls. Since patient did not return their calls, her order was then closed.   Per Corene Cornea, since she has Medicare, she may be required to do another bipap titration. He wasn't sure at the time of call but stated that he would reach back out to [place] if she needs one.   Will route to Judson Roch so she is aware.

## 2017-12-21 NOTE — Telephone Encounter (Signed)
Dr. Melvyn Novas, You are seeing this patient 12/30/2017. Please see if the patient wants to continue on CPAP or if she wants to pursue BiPAP again. She did not want to start BiPap due to cost last year. Thanks so much.

## 2017-12-28 ENCOUNTER — Encounter: Payer: Commercial Managed Care - PPO | Admitting: Internal Medicine

## 2018-01-05 ENCOUNTER — Encounter: Payer: Self-pay | Admitting: Acute Care

## 2018-01-14 ENCOUNTER — Ambulatory Visit (INDEPENDENT_AMBULATORY_CARE_PROVIDER_SITE_OTHER)
Admission: RE | Admit: 2018-01-14 | Discharge: 2018-01-14 | Disposition: A | Discharge status: Home / Self Care | Payer: Commercial Managed Care - PPO | Source: Ambulatory Visit | Attending: Internal Medicine | Admitting: Internal Medicine

## 2018-01-14 ENCOUNTER — Encounter: Payer: Self-pay | Admitting: Internal Medicine

## 2018-01-14 ENCOUNTER — Ambulatory Visit (INDEPENDENT_AMBULATORY_CARE_PROVIDER_SITE_OTHER): Payer: Commercial Managed Care - PPO | Admitting: Internal Medicine

## 2018-01-14 ENCOUNTER — Other Ambulatory Visit (INDEPENDENT_AMBULATORY_CARE_PROVIDER_SITE_OTHER): Payer: Commercial Managed Care - PPO

## 2018-01-14 VITALS — BP 134/78 | HR 75 | Ht 61.0 in | Wt 187.0 lb

## 2018-01-14 DIAGNOSIS — G4733 Obstructive sleep apnea (adult) (pediatric): Secondary | ICD-10-CM | POA: Diagnosis not present

## 2018-01-14 DIAGNOSIS — R109 Unspecified abdominal pain: Secondary | ICD-10-CM | POA: Diagnosis not present

## 2018-01-14 DIAGNOSIS — E7800 Pure hypercholesterolemia, unspecified: Secondary | ICD-10-CM

## 2018-01-14 DIAGNOSIS — M7989 Other specified soft tissue disorders: Secondary | ICD-10-CM

## 2018-01-14 DIAGNOSIS — I10 Essential (primary) hypertension: Secondary | ICD-10-CM

## 2018-01-14 LAB — CBC WITH DIFFERENTIAL/PLATELET
BASOS ABS: 0.1 10*3/uL (ref 0.0–0.1)
Basophils Relative: 0.9 % (ref 0.0–3.0)
Eosinophils Absolute: 0.1 10*3/uL (ref 0.0–0.7)
Eosinophils Relative: 1.6 % (ref 0.0–5.0)
HCT: 40.4 % (ref 36.0–46.0)
Hemoglobin: 13.8 g/dL (ref 12.0–15.0)
LYMPHS ABS: 2.3 10*3/uL (ref 0.7–4.0)
Lymphocytes Relative: 29.7 % (ref 12.0–46.0)
MCHC: 34.1 g/dL (ref 30.0–36.0)
MCV: 89.8 fl (ref 78.0–100.0)
MONO ABS: 0.5 10*3/uL (ref 0.1–1.0)
MONOS PCT: 6.3 % (ref 3.0–12.0)
NEUTROS ABS: 4.8 10*3/uL (ref 1.4–7.7)
NEUTROS PCT: 61.5 % (ref 43.0–77.0)
Platelets: 374 10*3/uL (ref 150.0–400.0)
RBC: 4.5 Mil/uL (ref 3.87–5.11)
RDW: 14.8 % (ref 11.5–15.5)
WBC: 7.8 10*3/uL (ref 4.0–10.5)

## 2018-01-14 LAB — URINALYSIS
BILIRUBIN URINE: NEGATIVE
KETONES UR: NEGATIVE
Leukocytes, UA: NEGATIVE
Nitrite: NEGATIVE
PH: 7 (ref 5.0–8.0)
SPECIFIC GRAVITY, URINE: 1.015 (ref 1.000–1.030)
Total Protein, Urine: NEGATIVE
UROBILINOGEN UA: 0.2 (ref 0.0–1.0)
Urine Glucose: NEGATIVE

## 2018-01-14 LAB — BASIC METABOLIC PANEL
BUN: 13 mg/dL (ref 6–23)
CALCIUM: 9.7 mg/dL (ref 8.4–10.5)
CO2: 31 mEq/L (ref 19–32)
Chloride: 103 mEq/L (ref 96–112)
Creatinine, Ser: 0.72 mg/dL (ref 0.40–1.20)
GFR: 85.11 mL/min (ref >60.00)
GLUCOSE: 101 mg/dL — AB (ref 70–99)
Potassium: 3.8 mEq/L (ref 3.5–5.1)
SODIUM: 142 meq/L (ref 135–145)

## 2018-01-14 LAB — LIPID PANEL
CHOLESTEROL: 203 mg/dL — AB (ref 0–200)
HDL: 49.8 mg/dL (ref >39.00)
LDL Cholesterol: 124 mg/dL — ABNORMAL HIGH (ref 0–99)
NONHDL: 153.15
Total CHOL/HDL Ratio: 4
Triglycerides: 147 mg/dL (ref 0.0–149.0)
VLDL: 29.4 mg/dL (ref 0.0–40.0)

## 2018-01-14 LAB — HEPATIC FUNCTION PANEL
ALK PHOS: 71 U/L (ref 39–117)
ALT: 11 U/L (ref 0–35)
AST: 12 U/L (ref 0–37)
Albumin: 4.4 g/dL (ref 3.5–5.2)
BILIRUBIN DIRECT: 0.1 mg/dL (ref 0.0–0.3)
Total Bilirubin: 0.4 mg/dL (ref 0.2–1.2)
Total Protein: 7.3 g/dL (ref 6.0–8.3)

## 2018-01-14 LAB — TSH: TSH: 1.24 u[IU]/mL (ref 0.35–4.50)

## 2018-01-14 MED ORDER — TRIAMTERENE-HCTZ 37.5-25 MG PO TABS: ORAL_TABLET | ORAL | 11 refills | Status: DC

## 2018-01-14 NOTE — Patient Instructions (Signed)
For leg swelling take maxzide -25 one daily as needed     To get the most out of exercise, you need to be continuously aware that you are short of breath, but never out of breath, for 30 minutes daily. As you improve, it will actually be easier for you to do the same amount of exercise  in  30 minutes so always push to the level where you are short of breath.   If already walking as fast as you are comfortable doing and you are not short of breath would rec:  Add weights to wrists or ankles to get more calorie burn out of your walking    Please schedule a follow up visit in 6  months but call sooner if needed

## 2018-01-14 NOTE — Progress Notes (Signed)
Subjective:    Patient ID: [name], female    DOB: [birth year] .   MRN: [id number]   Brief patient profile:  77 yowf never smoker with morbid obesity complicated by gastroesophageal reflux disease, and also a history of obstructive sleep apnea and chronic rhinitis/ hbp.   History of Present Illness  04/27/2014 f/u ov/[name] re: obesity/ ohs/hbp/djd  Chief Complaint  Patient presents with  . Annual Exam    Pt fasting. Overall doing well and denies any co's today.  no reg exercise, very tired in pms  > working out issues of sleep with [name] but he has not reviewed download yet rec Please remember to go to the lab and x-ray department downstairs for your tests - we will call you with the results when they are available. Prevnar today  Dr [name] will be in touch re your bipap settings    06/28/2017  f/u ov/[name] re:  Obesity/ ohs / osa sleeping on bipap - overdue with [name] per his last note  Chief Complaint  Patient presents with  . Follow-up    Doing well and no new co's today.   back on naprosyn most days per [name] for L hip  arthritis  Not limited by breathing from desired activities   Rec: no change rx   01/14/2018  f/u ov/[name] re:  cpx  :   Obesity/ osa/ gerd/hbp Chief Complaint  Patient presents with  . Annual Exam    Fasting. She is doing well and no new co's.  Dyspnea:  Walking  x up to 30 min 3-4 x per week  includes hills but no formal ex Cough: no Sleep: on cpap one pillow      No obvious day to day or daytime variability or assoc excess/ purulent sputum or mucus plugs or hemoptysis or cp or chest tightness, subjective wheeze or overt sinus or hb symptoms. No unusual exposure hx or h/o childhood pna/ asthma or knowledge of premature birth.  Sleeping  On cpap ok   without nocturnal  or early am exacerbation  of respiratory  c/o's or need for noct saba. Also denies any obvious fluctuation of symptoms with weather or environmental changes or other aggravating or  alleviating factors except as outlined above   Current Allergies, Complete Past Medical History, Past Surgical History, Family History, and Social History were reviewed in [employer] record.  ROS  The following are not active complaints unless bolded Hoarseness, sore throat, dysphagia, dental problems, itching, sneezing,  nasal congestion or discharge of excess mucus or purulent secretions, ear ache,   fever, chills, sweats, unintended wt loss or wt gain, classically pleuritic or exertional cp,  orthopnea pnd or arm/hand swelling  or leg swelling, presyncope, palpitations, abdominal pain, anorexia, nausea, vomiting, diarrhea  or change in bowel habits or change in bladder habits, change in stools or change in urine, dysuria, hematuria,  rash, arthralgias, visual complaints, headache, numbness, weakness or ataxia or problems with walking or coordination,  change in mood or  memory.        Current Meds  Medication Sig  . acetaminophen (TYLENOL) 325 MG tablet Take 650 mg by mouth every 6 (six) hours as needed. For pain  . ALPRAZolam (XANAX) 0.25 MG tablet Take 0.125 mg by mouth 3 (three) times daily as needed. For anxiety  . buPROPion (WELLBUTRIN XL) 300 MG 24 hr tablet Take 300 mg by mouth daily.  . Calcium Carbonate-Vitamin D 600-400 MG-UNIT per tablet Take 1 tablet  by mouth daily.    [name] dicyclomine (BENTYL) 10 MG capsule Take 1 capsule (10 mg total) by mouth 3 (three) times daily as needed for spasms.  . DULoxetine (CYMBALTA) 60 MG capsule Take 60 mg by mouth daily.  . famotidine (PEPCID) 20 MG tablet Take 20 mg by mouth at bedtime as needed. For allergies  . fluticasone (FLONASE) 50 MCG/ACT nasal spray PLACE 2 SPRAYS INTO BOTH NOSTRILS DAILY (Patient taking differently: Place 2 sprays into both nostrils at bedtime. )  . mirabegron ER (MYRBETRIQ) 25 MG TB24 tablet Take 1 tablet by mouth daily.  . Multiple Vitamin (MULTIVITAMIN WITH MINERALS) TABS tablet Take 1 tablet by  mouth daily.  [name] oxybutynin (DITROPAN-XL) 5 MG 24 hr tablet Take 1 tablet by mouth daily.  . pantoprazole (PROTONIX) 40 MG tablet TAKE 1 TABLET BY MOUTH TWICE DAILY. TAKE1 TABLET EVERY 30 TO 60 MINUTES BEFORE FIRST AND LAST MEALS OF THE DAY                   Past Medical History:  HIATAL HERNIA (ICD-553.3) with GERD  - H/o Barretts esophagus  - EGD pos HH/ stricture 04/18/10 , neg barrett's  RHINITIS, CHRONIC (ICD-472.0)  - Remote allergy testing = molds/dust only  - Sinus CT 09/06/08 mild thickening only  OBSTRUCTIVE SLEEP APNEA (ICD-327.23)..................................................[name]  --PSG 03/31/02 AHI 64  --Nocturnal BIPAP > titration done 05/16/2015  MORBID OBESITY (ICD-278.01)  - Target wt = 158 for BMI < 30  POSITIVE PPD (ICD-795.5)  DIVERTICULOSIS  Shingles R T5/6 03/2015  Depression / Anxiety ..............................................................[name] [name] Stasis dermatitis ..................................................................... 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  Never smoker, but Husband smoked / passed 2426 Illicit Drug Use - no  Patient does not get regular exercise.  No etoh          Objective:   Physical Exam  wt 224 May 23, 2009 > 226 September 10, 2009 > 229 November 05, 2009 > 232 June 03, 2010 > 224 05/25/2011 >230 01/11/12 > 03/04/2012  225 > 05/17/2012  219 >217 07/29/2012 > 219  09/06/2012 >223 03/16/2013 > 04/11/2013  225 > 05/22/2013  225 > 225 06/23/2013 >  02/27/2014 215 > 04/27/2014  215 >  04/24/2015  222>  12/24/2015   214 >  03/02/2016  214 > 07/20/2016  220 > 08/20/2016  219 >  12/24/2016   211 > 02/12/2017  210 > 06/28/2017   207  > 01/14/2018  187     amb pleasant wf nad    Vital signs reviewed - Note on arrival 02 sats  98% on RA    .  Skin  Min venous stasis dermatitis = both legs with 1+ piting L > R leg    HEENT: nl dentition, turbinates bilaterally, and oropharynx. Nl external ear canals without cough reflex   NECK :  without JVD/Nodes/TM/ nl carotid upstrokes bilaterally   LUNGS:  no acc muscle use,  Nl contour chest which is clear to A and P bilaterally without cough on insp or exp maneuvers   CV:  RRR  no s3 or murmur or increase in P2, and 1+ pitting L > R ankle   ABD:  soft and nontender with nl inspiratory excursion in the supine position. No bruits or organomegaly appreciated, bowel sounds nl  MS:  Nl gait/ ext warm without deformities, calf tenderness, cyanosis or clubbing No obvious joint restrictions   SKIN: warm and dry with min stasis dermatitis changes both LE's  NEURO:  alert, approp, nl sensorium with  no motor or cerebellar deficits apparent.        CXR PA and Lateral:   01/14/2018 :    I personally reviewed images and agree with radiology impression as follows:   No active cardiopulmonary disease.   Labs ordered/ reviewed:      Chemistry      Component Value Date/Time   NA 142 01/14/2018 0925   K 3.8 01/14/2018 0925   CL 103 01/14/2018 0925   CO2 31 01/14/2018 0925   BUN 13 01/14/2018 0925   CREATININE 0.72 01/14/2018 0925      Component Value Date/Time   CALCIUM 9.7 01/14/2018 0925   ALKPHOS 71 01/14/2018 0925   AST 12 01/14/2018 0925   ALT 11 01/14/2018 0925   BILITOT 0.4 01/14/2018 0925        Lab Results  Component Value Date   WBC 7.8 01/14/2018   HGB 13.8 01/14/2018   HCT 40.4 01/14/2018   MCV 89.8  01/14/2018   PLT 374.0 01/14/2018         Lab Results  Component Value Date   TSH 1.24 01/14/2018     Lab Results  Component Value Date   PROBNP 17.0 01/11/2012                Assessment & Plan:

## 2018-01-18 ENCOUNTER — Encounter: Payer: Self-pay | Admitting: Internal Medicine

## 2018-01-18 NOTE — Assessment & Plan Note (Signed)
-   Ideal LDL < 130 with hbp and ? Pos fm hx  Lab Results  Component Value Date   CHOL 203 (H) 01/14/2018   HDL 49.80 01/14/2018   LDLCALC 124 (H) 01/14/2018   LDLDIRECT 140.4 05/22/2013   TRIG 147.0 01/14/2018   CHOLHDL 4 01/14/2018    Adequate control on present rx, reviewed in detail with pt > no change in rx needed  = diet/ ex

## 2018-01-18 NOTE — Assessment & Plan Note (Signed)
-   on Bipap chronically> retitrated 05/16/2015  - HC03 up to 33 on bmet 12/20/13 > referred back to Dr [name] - HC03  12/24/2016  = 34    - HC03 01/14/2018 = 31 c/w improving hypercarbia with intentional wt loss

## 2018-01-18 NOTE — Assessment & Plan Note (Signed)
Venous dopplers at [facility]  04/19/16 > min L insufficiency   Add maxzide 25 one q d prn excess swelling

## 2018-01-18 NOTE — Assessment & Plan Note (Signed)
Referred to GI and seen 03/23/16 dx ibs/gerd - ERCP  09/16/17 c/w stone/ stent placed> referred to [facility] 09/27/17 extracted > pain resolved  And LFT's nl 01/14/2018

## 2018-01-18 NOTE — Assessment & Plan Note (Addendum)
Adequate control on present rx, reviewed in detail with pt > no change in rx needed  = diet/ ex   Lab Results  Component Value Date   CREATININE 0.72 01/14/2018   CREATININE 0.72 09/13/2017   CREATININE 0.71 12/24/2016

## 2018-02-23 ENCOUNTER — Other Ambulatory Visit: Payer: Self-pay | Admitting: Gastroenterology

## 2018-03-29 ENCOUNTER — Ambulatory Visit: Payer: Self-pay | Admitting: Internal Medicine

## 2018-04-15 IMAGING — DX DG CHEST 2V
2 series · 2 of 2 positions shown · non-contrast
Comparison: 05/16/2015

CLINICAL DATA: Routine health care maintenance, history hiatal
hernia, GERD, hypertension

EXAM:
CHEST  2 VIEW

[chest pa]
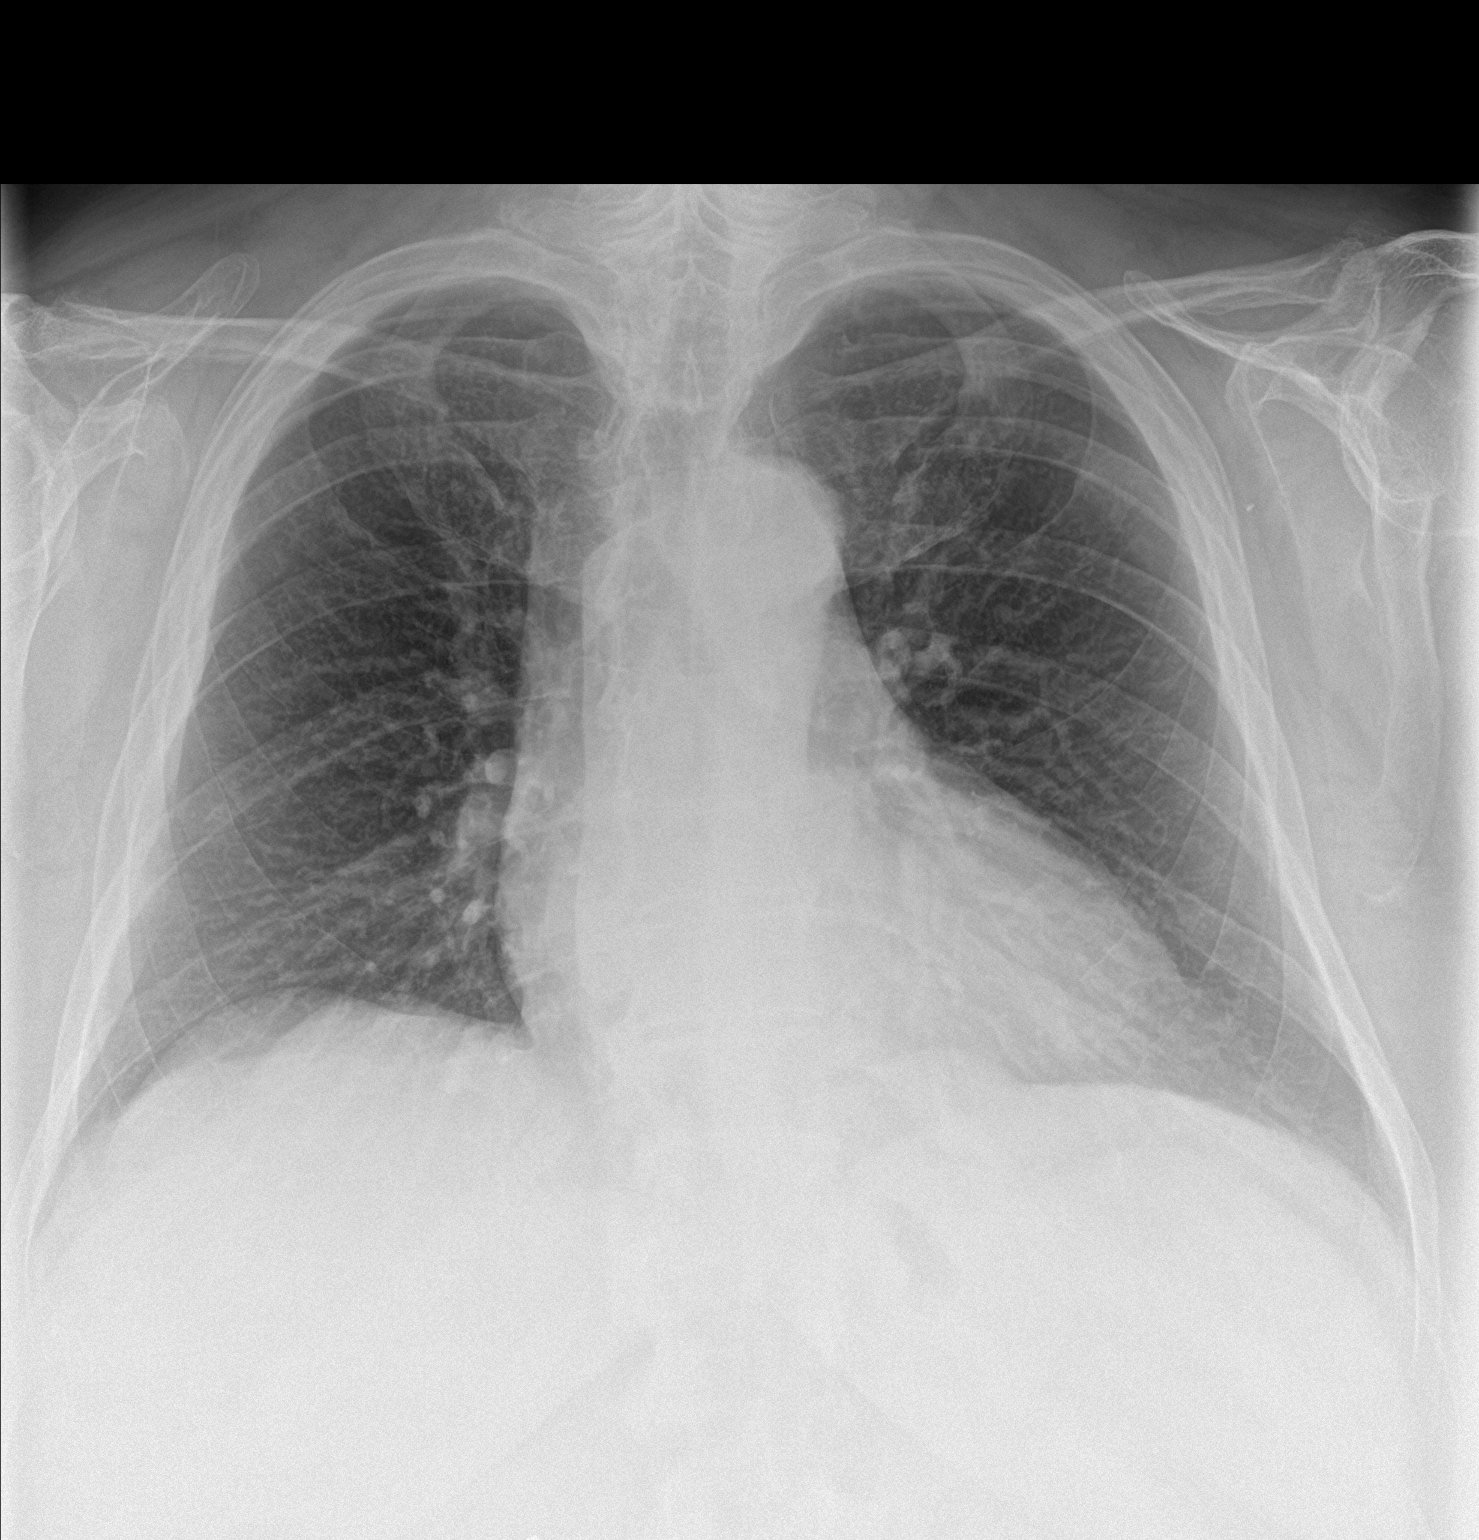

[chest lat]
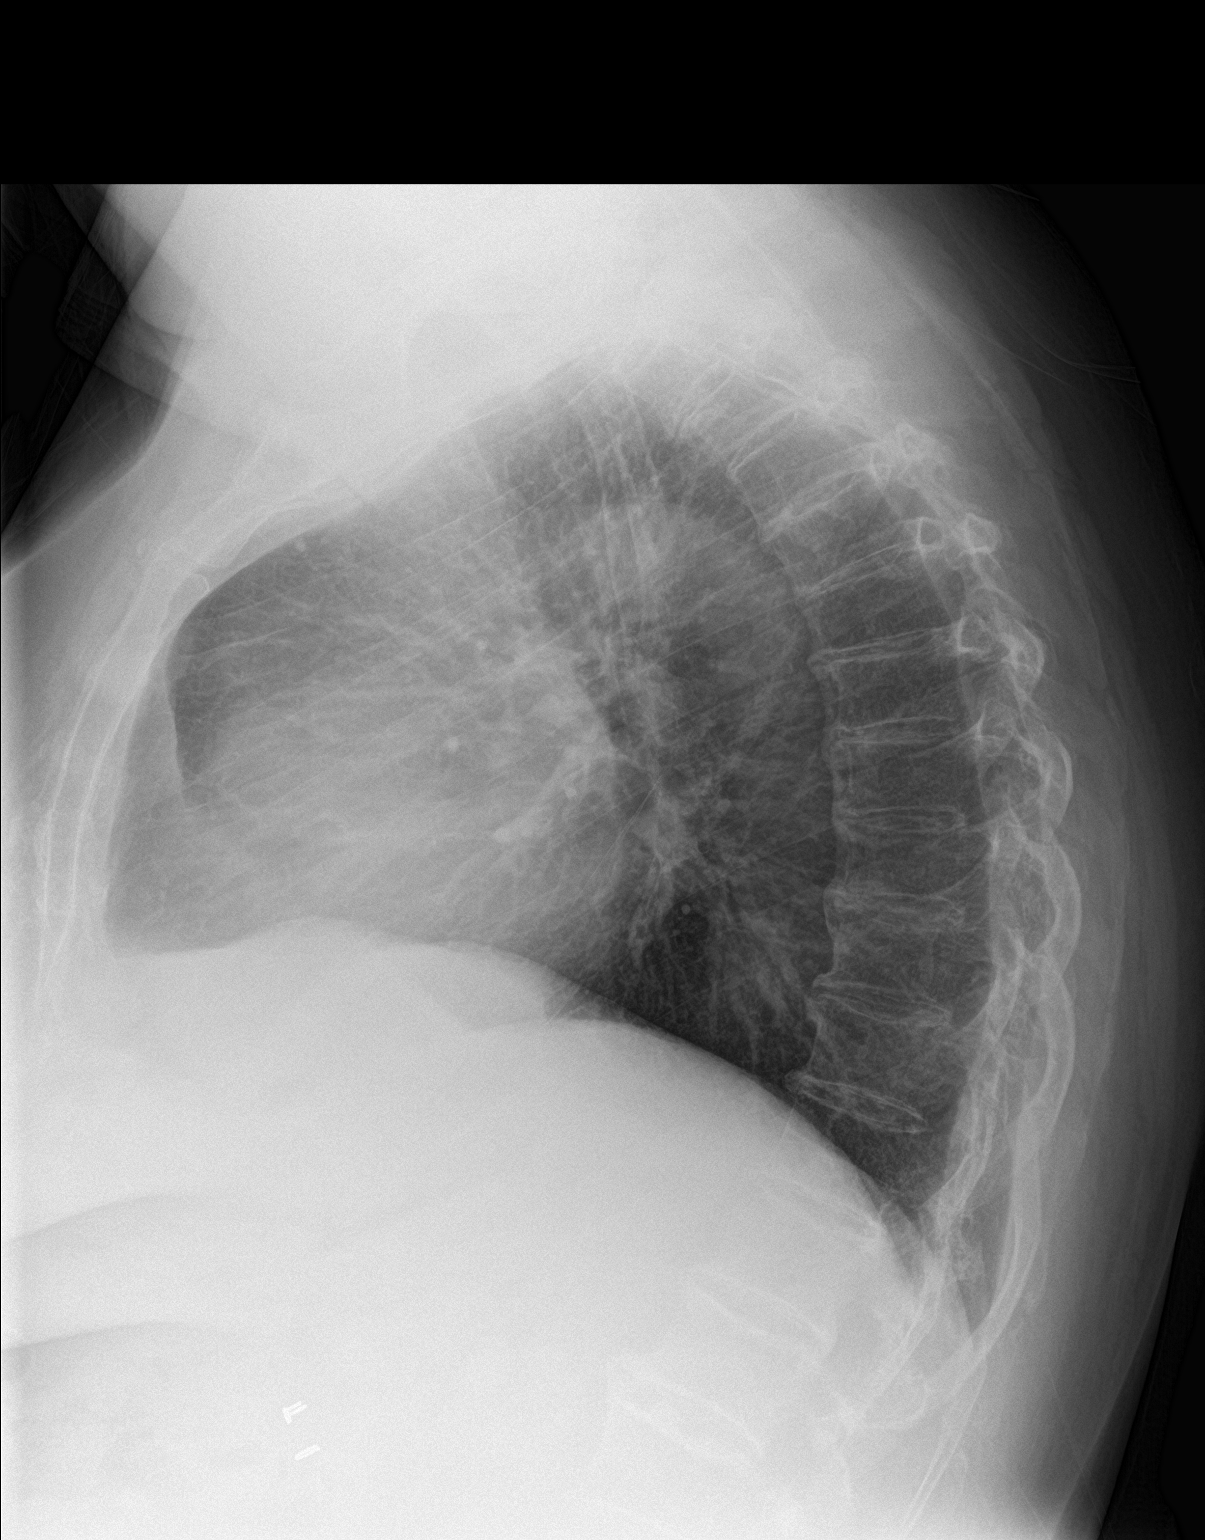

[2 of 2 positions shown; findings below may reference images not displayed]

FINDINGS: Enlargement of cardiac silhouette.

Mediastinal contours and pulmonary vascularity normal.

Lungs clear.

No pleural effusion or pneumothorax.

Bones demineralized with minimal degenerative disc disease changes
thoracic spine.
IMPRESSION: Enlargement of cardiac silhouette.

Osseous demineralization.

## 2018-04-18 ENCOUNTER — Ambulatory Visit: Payer: Self-pay | Admitting: Internal Medicine

## 2018-04-19 ENCOUNTER — Ambulatory Visit: Payer: Commercial Managed Care - PPO | Admitting: Psychology

## 2018-05-16 ENCOUNTER — Ambulatory Visit: Payer: Medicare Other | Admitting: Psychology

## 2018-05-16 ENCOUNTER — Ambulatory Visit: Payer: Self-pay | Admitting: Internal Medicine

## 2018-05-20 ENCOUNTER — Encounter: Payer: Self-pay | Admitting: Internal Medicine

## 2018-05-20 ENCOUNTER — Ambulatory Visit (INDEPENDENT_AMBULATORY_CARE_PROVIDER_SITE_OTHER): Payer: Commercial Managed Care - PPO | Admitting: Internal Medicine

## 2018-05-20 VITALS — BP 140/80 | HR 80 | Ht 61.0 in | Wt 194.0 lb

## 2018-05-20 DIAGNOSIS — R490 Dysphonia: Secondary | ICD-10-CM | POA: Insufficient documentation

## 2018-05-20 DIAGNOSIS — I10 Essential (primary) hypertension: Secondary | ICD-10-CM | POA: Diagnosis not present

## 2018-05-20 NOTE — Patient Instructions (Addendum)
Add pepcid 20 mg at bedtime   GERD (REFLUX)  is an extremely common cause of respiratory symptoms just like yours , many times with no obvious heartburn at all.    It can be treated with medication, but also with lifestyle changes including elevation of the head of your bed (ideally with 6 inch  bed blocks),  Smoking cessation, avoidance of late meals, excessive alcohol, and avoid fatty foods, chocolate, peppermint, colas, red wine, and acidic juices such as orange juice.  NO MINT OR MENTHOL PRODUCTS SO NO COUGH DROPS  USE SUGARLESS CANDY INSTEAD (Jolley ranchers or Stover's or Life Savers) or even ice chips will also do - the key is to swallow to prevent all throat clearing. NO OIL BASED VITAMINS - use powdered substitutes    Please see patient coordinator before you leave today  to schedule ENT eval for hoarseness    Keep your previous appt

## 2018-05-20 NOTE — Progress Notes (Signed)
Subjective:   Patient ID: [name], female    DOB: 05-Aug-1946 .   MRN: [id number]   Brief patient profile:  51 yowf never smoker with morbid obesity complicated by gastroesophageal reflux disease, and also a history of obstructive sleep apnea and chronic rhinitis/ hbp.    History of Present Illness  04/27/2014 f/u ov/[name] re: obesity/ ohs/hbp/djd  Chief Complaint  Patient presents with  . Annual Exam    Pt fasting. Overall doing well and denies any co's today.  no reg exercise, very tired in pms  > working out issues of sleep with [name] but he has not reviewed download yet rec Please remember to go to the lab and x-ray department downstairs for your tests - we will call you with the results when they are available. Prevnar today  Dr [name] will be in touch re your bipap settings    06/28/2017  F/u    01/14/2018  f/u ov/[name] re:  cpx  :   Obesity/ osa/ gerd/hbp Chief Complaint  Patient presents with  . Annual Exam    Fasting. She is doing well and no new co's.  Dyspnea:  Walking  x up to 30 min 3-4 x per week  includes hills but no formal ex Cough: no Sleep: on cpap one pillow  rec For leg swelling take maxzide -25 one daily as needed To get the most out of exercise, you need to be continuously aware that you are short of breath,     05/20/2018  f/u ov/[name] re:  [name] using ppi bid ac but not pepcid / new hoarseness x 31m/ plus hbp/ MO Chief Complaint  Patient presents with  . Acute Visit    Hoarseness off and on for the past 4 months.   Dyspnea:  Not limited by breathing from desired activities  / no aerobic at all now  Cough:clearing throat more s pattern but  not noct/ notes more at hosp with voice use  Sleeping: cpap flat one pillow SABA use: none  02: none     No obvious day to day or daytime variability or assoc excess/ purulent sputum or mucus plugs or hemoptysis or cp or chest tightness, subjective wheeze or overt sinus or hb symptoms.   Sleeps as above   without nocturnal  or early am exacerbation  of respiratory  c/o's or need for noct saba. Also denies any obvious fluctuation of symptoms with weather or environmental changes or other aggravating or alleviating factors except as outlined above   No unusual exposure hx or h/o childhood pna/ asthma or knowledge of premature birth.  Current Allergies, Complete Past Medical History, Past Surgical History, Family History, and Social History were reviewed in [employer] record.  ROS  The following are not active complaints unless bolded Hoarseness, sore throat, dysphagia, dental problems, itching, sneezing,  nasal congestion or discharge of excess mucus or purulent secretions, ear ache,   fever, chills, sweats, unintended wt loss or wt gain, classically pleuritic or exertional cp,  orthopnea pnd or arm/hand swelling  or leg swelling improved on maxzide, presyncope, palpitations, abdominal pain, anorexia, nausea, vomiting, diarrhea  or change in bowel habits or change in bladder habits, change in stools or change in urine, dysuria, hematuria,  rash, arthralgias, visual complaints, headache, numbness, weakness or ataxia or problems with walking or coordination,  change in mood or  memory.        Current Meds  Medication Sig  . acetaminophen (TYLENOL) 325 MG  tablet Take 650 mg by mouth every 6 (six) hours as needed. For pain  . ALPRAZolam (XANAX) 0.25 MG tablet Take 0.125 mg by mouth 3 (three) times daily as needed. For anxiety  . buPROPion (WELLBUTRIN XL) 300 MG 24 hr tablet Take 300 mg by mouth daily.  . Calcium Carbonate-Vitamin D 600-400 MG-UNIT per tablet Take 1 tablet by mouth daily.    [name] dicyclomine (BENTYL) 10 MG capsule Take 1 capsule (10 mg total) by mouth 3 (three) times daily as needed for spasms.  . DULoxetine (CYMBALTA) 60 MG capsule Take 60 mg by mouth daily.  . famotidine (PEPCID) 20 MG tablet Take 20 mg by mouth at bedtime as needed. For allergies  . fluticasone  (FLONASE) 50 MCG/ACT nasal spray PLACE 2 SPRAYS INTO BOTH NOSTRILS DAILY (Patient taking differently: Place 2 sprays into both nostrils at bedtime. )  . mirabegron ER (MYRBETRIQ) 25 MG TB24 tablet Take 1 tablet by mouth daily.  . Multiple Vitamin (MULTIVITAMIN WITH MINERALS) TABS tablet Take 1 tablet by mouth daily.  [name] oxybutynin (DITROPAN-XL) 5 MG 24 hr tablet Take 1 tablet by mouth daily.  . pantoprazole (PROTONIX) 40 MG tablet TAKE 1 TABLET TWICE DAILY. ([place] LAST MEAL OF THE DAY)  . triamterene-hydrochlorothiazide (MAXZIDE-25) 37.5-25 MG tablet 1 daily as needed for swelling               Past Medical History:  HIATAL HERNIA (ICD-553.3) with GERD  - H/o Barretts esophagus  - EGD pos HH/ stricture 04/18/10 , neg barrett's  RHINITIS, CHRONIC (ICD-472.0)  - Remote allergy testing = molds/dust only  - Sinus CT 09/06/08 mild thickening only  OBSTRUCTIVE SLEEP APNEA (ICD-327.23)..................................................[name]  --PSG 03/31/02 AHI 64  --Nocturnal BIPAP > titration done 05/16/2015  MORBID OBESITY (ICD-278.01)  - Target wt = 158 for BMI < 30  POSITIVE PPD (ICD-795.5)  DIVERTICULOSIS  Shingles R T5/6 03/2015  Depression / Anxiety ..............................................................[name] Karr Stasis dermatitis ..................................................................... [employer] MAIN[employer]........................................................Marland KitchenWert  - DT May 23, 2009  - Pneumovax 04/2013, Prevnar 04/27/2014  - CPX 01/14/2018  - GYN Levoix        Family History:  Father - CAD, ? 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  Never smoker, but Husband smoked / passed [id number] Illicit Drug Use - no  Patient does not get regular exercise.  No etoh          Objective:   Physical Exam  wt 224 May 23, 2009 > 226 September 10, 2009 > 229 November 05, 2009 > 232 June 03, 2010 > 224 05/25/2011 >230 01/11/12 > 03/04/2012  225 > 05/17/2012  219 >217 07/29/2012 > 219 09/06/2012 >223 03/16/2013 > 04/11/2013  225 > 05/22/2013  225 > 225 06/23/2013 >  02/27/2014 215 > 04/27/2014  215 >  04/24/2015  222>  12/24/2015   214 >  03/02/2016  214 > 07/20/2016  220 > 08/20/2016  219 >  12/24/2016   211 > 02/12/2017  210 > 06/28/2017   207  > 01/14/2018  187  > 05/20/2018  194     amb obese wf  nad min hoarse    Vital signs reviewed - Note on arrival 02 sats  96% on RA    HEENT: nl dentition, turbinates bilaterally, and oropharynx. Nl external ear canals without cough reflex   NECK :  without JVD/Nodes/TM/ nl carotid upstrokes bilaterally   LUNGS: no acc muscle use,  Nl contour chest which is clear to A and P bilaterally without cough on insp or exp maneuvers   CV:  RRR  no s3 or murmur or increase in P2, and trace bilateral ankle edema  ABD:  soft and nontender with nl inspiratory excursion in the supine position. No bruits or organomegaly appreciated, bowel sounds nl  MS:  Nl gait/ ext warm without deformities, calf tenderness, cyanosis or clubbing No obvious joint restrictions   SKIN: warm and dry without lesions    NEURO:  alert, approp, nl sensorium with  no motor or cerebellar deficits apparent.                               Assessment & Plan:

## 2018-05-21 ENCOUNTER — Encounter: Payer: Self-pay | Admitting: Internal Medicine

## 2018-05-21 NOTE — Assessment & Plan Note (Signed)
Intermittent, never smoker so Likely gerd related / Upper airway cough syndrome (previously labeled PNDS),  is so named because it's frequently impossible to sort out how much is  CR/sinusitis with freq throat clearing (which can be related to primary GERD)   vs  causing  secondary (" extra esophageal")  GERD from wide swings in gastric pressure that occur with throat clearing, often  promoting self use of mint and menthol lozenges that reduce the lower esophageal sphincter tone and exacerbate the problem further in a cyclical fashion.   These are the same pts (now being labeled as having "irritable larynx syndrome" by some cough centers) who not infrequently have a history of having failed to tolerate ace inhibitors,  dry powder inhalers or biphosphonates or report having atypical/extraesophageal reflux symptoms that don't respond to standard doses of PPI  and are easily confused as having aecopd or asthma flares by even experienced allergists/ pulmonologists (myself included).   rec add bed blocks/ pepcid hs and refer to ENT

## 2018-05-21 NOTE — Assessment & Plan Note (Signed)
Body mass index is 36.66 kg/m.  -  trending up  Lab Results  Component Value Date   TSH 1.24 01/14/2018     Contributing to gerd risk/ doe/reviewed the need and the process to achieve and maintain neg calorie balance > defer f/u primary care including intermittently monitoring thyroid status     I had an extended discussion with the patient reviewing all relevant studies completed to date and  lasting 15 to 20 minutes of a 25 minute visit    Each maintenance medication was reviewed in detail including most importantly the difference between maintenance and prns and under what circumstances the prns are to be triggered using an action plan format that is not reflected in the computer generated alphabetically organized AVS.     Please see AVS for specific instructions unique to this visit that I personally wrote and verbalized to the the pt in detail and then reviewed with pt  by my nurse highlighting any  changes in therapy recommended at today's visit to their plan of care.

## 2018-05-21 NOTE — Assessment & Plan Note (Signed)
Adequate control on present rx, reviewed in detail with pt > no change in rx needed , maxzide 25 prn controlling bp and pedal edema

## 2018-05-25 ENCOUNTER — Telehealth: Payer: Self-pay | Admitting: Internal Medicine

## 2018-05-25 DIAGNOSIS — R490 Dysphonia: Secondary | ICD-10-CM

## 2018-05-25 NOTE — Telephone Encounter (Signed)
Called and spoke with patient. No order put in for referral. Order placed now.   Nothing further needed.

## 2018-06-07 DIAGNOSIS — R490 Dysphonia: Secondary | ICD-10-CM | POA: Insufficient documentation

## 2018-07-01 DIAGNOSIS — M79645 Pain in left finger(s): Secondary | ICD-10-CM | POA: Insufficient documentation

## 2018-07-18 ENCOUNTER — Ambulatory Visit: Payer: Commercial Managed Care - PPO | Admitting: Internal Medicine

## 2018-08-02 ENCOUNTER — Encounter: Payer: Self-pay | Admitting: Internal Medicine

## 2018-08-02 ENCOUNTER — Ambulatory Visit (INDEPENDENT_AMBULATORY_CARE_PROVIDER_SITE_OTHER): Payer: Commercial Managed Care - PPO | Admitting: Internal Medicine

## 2018-08-02 VITALS — BP 138/70 | HR 65 | Ht 61.0 in | Wt 196.0 lb

## 2018-08-02 DIAGNOSIS — I10 Essential (primary) hypertension: Secondary | ICD-10-CM

## 2018-08-02 DIAGNOSIS — Z0000 Encounter for general adult medical examination without abnormal findings: Secondary | ICD-10-CM

## 2018-08-02 DIAGNOSIS — R490 Dysphonia: Secondary | ICD-10-CM

## 2018-08-02 DIAGNOSIS — G44229 Chronic tension-type headache, not intractable: Secondary | ICD-10-CM

## 2018-08-02 DIAGNOSIS — M7989 Other specified soft tissue disorders: Secondary | ICD-10-CM | POA: Diagnosis not present

## 2018-08-02 NOTE — Patient Instructions (Signed)
No change in medications - remember elastic hose   Please schedule a follow up visit in 6 months but call sooner if needed  - CPX after January 23 2019

## 2018-08-02 NOTE — Progress Notes (Signed)
Subjective:   Patient ID: [name], female    DOB: 1946-06-03     MRN: [id number]   Brief patient profile:  57 yowf never smoker with morbid obesity complicated by gastroesophageal reflux disease, and also a history of obstructive sleep apnea and chronic rhinitis/ hbp.   History of Present Illness  04/27/2014 f/u ov/[name] re: obesity/ ohs/hbp/djd  Chief Complaint  Patient presents with  . Annual Exam    Pt fasting. Overall doing well and denies any co's today.  no reg exercise, very tired in pms  > working out issues of sleep with [name] but he has not reviewed download yet rec Please remember to go to the lab and x-ray department downstairs for your tests - we will call you with the results when they are available. Prevnar today  Dr [name] will be in touch re your bipap settings    06/28/2017  F/u    01/14/2018  f/u ov/[name] re:  cpx  :   Obesity/ osa/ gerd/hbp Chief Complaint  Patient presents with  . Annual Exam    Fasting. She is doing well and no new co's.  Dyspnea:  Walking  x up to 30 min 3-4 x per week  includes hills but no formal ex Cough: no Sleep: on cpap one pillow  rec For leg swelling take maxzide -25 one daily as needed To get the most out of exercise, you need to be continuously aware that you are short of breath,     05/20/2018  f/u ov/[name] re:  [name] using ppi bid ac but not pepcid / new hoarseness x 85m/ plus hbp/ MO Chief Complaint  Patient presents with  . Acute Visit    Hoarseness off and on for the past 4 months.   Dyspnea:  Not limited by breathing from desired activities  / no aerobic at all now  Cough:clearing throat more s pattern but  not noct/ notes more at hosp with voice use  Sleeping: cpap flat one pillow rec Add pepcid 20 mg at bedtime GERD diet  Please see patient coordinator before you leave today  to schedule ENT eval for hoarseness>  Saw [name] 06/07/18  dx chronic nasopharyngitis and dysphonia> improved with abx/ ns  irrigation   08/02/2018  f/u ov/[name] re: hbp /gerd/ ha generalized  In pm's better p tylenol  Chief Complaint  Patient presents with  . Follow-up    Doing well overall. She has HA on days that it rains.   Dyspnea:  Not limited by breathing from desired activities  / busy helping take care of son with disabilities with new tension pattern HA  Cough: none now  Sleeping: bipap/ flat on one pillow  SABA use: none 02: none     No obvious day to day or daytime variability or assoc excess/ purulent sputum or mucus plugs or hemoptysis or cp or chest tightness, subjective wheeze or overt sinus or hb symptoms.   Sleeping  without nocturnal  or early am exacerbation  of respiratory  c/o's or need for noct saba. Also denies any obvious fluctuation of symptoms with weather or environmental changes or other aggravating or alleviating factors except as outlined above   No unusual exposure hx or h/o childhood pna/ asthma or knowledge of premature birth.  Current Allergies, Complete Past Medical History, Past Surgical History, Family History, and Social History were reviewed in [employer] record.  ROS  The following are not active complaints unless bolded Hoarseness, sore throat,  dysphagia, dental problems, itching, sneezing,  nasal congestion much better  or discharge of excess mucus or purulent secretions, ear ache,   fever, chills, sweats, unintended wt loss or wt gain, classically pleuritic or exertional cp,  orthopnea pnd or arm/hand swelling  or leg swelling improves with prn maxzide , presyncope, palpitations, abdominal pain, anorexia, nausea, vomiting, diarrhea  or change in bowel habits or change in bladder habits, change in stools or change in urine, dysuria, hematuria,  rash, arthralgias, visual complaints, headache, numbness, weakness or ataxia or problems with walking or coordination,  change in mood or  memory.        Current Meds  Medication Sig  . acetaminophen  (TYLENOL) 325 MG tablet Take 650 mg by mouth every 6 (six) hours as needed. For pain  . ALPRAZolam (XANAX) 0.25 MG tablet Take 0.125 mg by mouth 3 (three) times daily as needed. For anxiety  . buPROPion (WELLBUTRIN XL) 300 MG 24 hr tablet Take 300 mg by mouth daily.  . Calcium Carbonate-Vitamin D 600-400 MG-UNIT per tablet Take 1 tablet by mouth daily.    [name] dicyclomine (BENTYL) 10 MG capsule Take 1 capsule (10 mg total) by mouth 3 (three) times daily as needed for spasms.  . famotidine (PEPCID) 20 MG tablet Take 20 mg by mouth at bedtime as needed. For allergies  . fluticasone (FLONASE) 50 MCG/ACT nasal spray PLACE 2 SPRAYS INTO BOTH NOSTRILS DAILY (Patient taking differently: Place 2 sprays into both nostrils at bedtime. )  . mirabegron ER (MYRBETRIQ) 25 MG TB24 tablet Take 1 tablet by mouth daily.  . Multiple Vitamin (MULTIVITAMIN WITH MINERALS) TABS tablet Take 1 tablet by mouth daily.  . pantoprazole (PROTONIX) 40 MG tablet TAKE 1 TABLET TWICE DAILY. ([place] LAST MEAL OF THE DAY)  . triamterene-hydrochlorothiazide (MAXZIDE-25) 37.5-25 MG tablet 1 daily as needed for swelling  . VIIBRYD 40 MG TABS Take 40 mg by mouth daily.                    Past Medical History:  HIATAL HERNIA (ICD-553.3) with GERD  - H/o Barretts esophagus  - EGD pos HH/ stricture 04/18/10 , neg barrett's  RHINITIS, CHRONIC (ICD-472.0) .................................................................... [name]  - Remote allergy testing = molds/dust only  - Sinus CT 09/06/08 mild thickening only  OBSTRUCTIVE SLEEP APNEA (ICD-327.23)..................................................[name]  --PSG 03/31/02 AHI 64  --Nocturnal BIPAP > titration done 05/16/2015  MORBID OBESITY (ICD-278.01)  - Target wt = 158 for BMI < 30  POSITIVE PPD (ICD-795.5)  DIVERTICULOSIS  Shingles R T5/6 03/2015  Depression / Anxiety ..............................................................[name] Karr Stasis dermatitis  ..................................................................... [employer] MAIN[employer]........................................................Marland KitchenWert  - DT May 23, 2009  - Pneumovax 04/2013, Prevnar 04/27/2014  - CPX 01/14/2018  - GYN Levoix        Family History:  Father - CAD, ? 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  Never smoker, but Husband smoked / passed [id number] Illicit Drug Use - no  Patient does not get regular exercise.  No etoh          Objective:   Physical Exam  wt 224  May 23, 2009 > 226 September 10, 2009 > 229 November 05, 2009 > 232 June 03, 2010 > 224 05/25/2011 >230 01/11/12 > 03/04/2012  225 > 05/17/2012  219 >217 07/29/2012 > 219 09/06/2012 >223 03/16/2013 > 04/11/2013  225 > 05/22/2013  225 > 225 06/23/2013 >  02/27/2014 215 > 04/27/2014  215 >  04/24/2015  222>  12/24/2015   214 >  03/02/2016  214 > 07/20/2016  220 > 08/20/2016  219 >  12/24/2016   211 > 02/12/2017  210 > 06/28/2017   207  > 01/14/2018  187  > 05/20/2018  194  > 08/02/2018  196   Amb wf no longer hoarse      Vital signs reviewed - Note on arrival 02 sats  97% on RA  And bp 138/70         HEENT: nl dentition, turbinates bilaterally, and oropharynx. Nl external ear canals without cough reflex   NECK :  without JVD/Nodes/TM/ nl carotid upstrokes bilaterally   LUNGS: no acc muscle use,  Nl contour chest which is clear to A and P bilaterally without cough on insp or exp maneuvers   CV:  RRR  no s3 or murmur or increase in P2, and trace pitting edema both ankles sym    ABD:  Obese soft and nontender with nl inspiratory excursion in the supine position. No bruits or organomegaly  appreciated, bowel sounds nl  MS:  Nl gait/ ext warm without deformities, calf tenderness, cyanosis or clubbing No obvious joint restrictions   SKIN: warm and dry without lesions    NEURO:  alert, approp, nl sensorium with  no motor or cerebellar deficits apparent.            Assessment & Plan:

## 2018-08-03 ENCOUNTER — Encounter: Payer: Self-pay | Admitting: Internal Medicine

## 2018-08-03 DIAGNOSIS — G44229 Chronic tension-type headache, not intractable: Secondary | ICD-10-CM | POA: Insufficient documentation

## 2018-08-03 NOTE — Assessment & Plan Note (Signed)
-   GI screening [facility] Plan - GYN  [facility] - Mammos neg 10/05/13  And 09/02/17    Due for mammography 08/2018 and wants to change to [facility] > done

## 2018-08-03 NOTE — Assessment & Plan Note (Signed)
Onset fall 2019 with stress related to son's illness   Responding to tylenol with typical pm pattern > f/u neuro prn    I had an extended discussion with the patient reviewing all relevant studies completed to date and  lasting 15 to 20 minutes of a 25 minute visit    Each maintenance medication was reviewed in detail including most importantly the difference between maintenance and prns and under what circumstances the prns are to be triggered using an action plan format that is not reflected in the computer generated alphabetically organized AVS.     Please see AVS for specific instructions unique to this visit that I personally wrote and verbalized to the the pt in detail and then reviewed with pt  by my nurse highlighting any  changes in therapy recommended at today's visit to their plan of care.

## 2018-08-03 NOTE — Assessment & Plan Note (Signed)
Lab Results  Component Value Date   CREATININE 0.72 01/14/2018   CREATININE 0.72 09/13/2017   CREATININE 0.71 12/24/2016     Adequate control on present rx, reviewed in detail with pt > no change in rx needed

## 2018-08-03 NOTE — Assessment & Plan Note (Signed)
Refer to ENT eval by [name] 06/07/18 c/w nasapharangitis rec mupirocin irrigation  >> improved/ f/u ENT prn

## 2018-08-03 NOTE — Assessment & Plan Note (Signed)
-   Target wt 158 for bmi < 30    Body mass index is 37.03 kg/m.  -  trending back up Lab Results  Component Value Date   TSH 1.24 01/14/2018     Contributing to gerd risk/ doe/reviewed the need and the process to achieve and maintain neg calorie balance > defer f/u primary care including intermittently monitoring thyroid status

## 2018-08-03 NOTE — Assessment & Plan Note (Signed)
Venous dopplers at [facility]  04/19/16 > min L insufficiency   Controlled with maxzide prn, advised to add elastic hose

## 2018-09-06 ENCOUNTER — Ambulatory Visit (INDEPENDENT_AMBULATORY_CARE_PROVIDER_SITE_OTHER): Payer: Commercial Managed Care - PPO | Admitting: Primary Care

## 2018-09-06 ENCOUNTER — Encounter: Payer: Self-pay | Admitting: Primary Care

## 2018-09-06 VITALS — BP 154/74 | HR 58 | Temp 98.2°F | Ht 62.0 in | Wt 185.4 lb

## 2018-09-06 DIAGNOSIS — R109 Unspecified abdominal pain: Secondary | ICD-10-CM

## 2018-09-06 DIAGNOSIS — R1013 Epigastric pain: Secondary | ICD-10-CM

## 2018-09-06 DIAGNOSIS — I10 Essential (primary) hypertension: Secondary | ICD-10-CM

## 2018-09-06 LAB — CBC WITH DIFFERENTIAL/PLATELET
Basophils Absolute: 0.1 10*3/uL (ref 0.0–0.1)
Basophils Relative: 0.6 % (ref 0.0–3.0)
EOS PCT: 1.4 % (ref 0.0–5.0)
Eosinophils Absolute: 0.1 10*3/uL (ref 0.0–0.7)
HEMATOCRIT: 39 % (ref 36.0–46.0)
HEMOGLOBIN: 12.8 g/dL (ref 12.0–15.0)
Lymphocytes Relative: 31.6 % (ref 12.0–46.0)
Lymphs Abs: 2.6 10*3/uL (ref 0.7–4.0)
MCHC: 32.9 g/dL (ref 30.0–36.0)
MCV: 88.8 fl (ref 78.0–100.0)
MONOS PCT: 10.5 % (ref 3.0–12.0)
Monocytes Absolute: 0.9 10*3/uL (ref 0.1–1.0)
Neutro Abs: 4.6 10*3/uL (ref 1.4–7.7)
Neutrophils Relative %: 55.9 % (ref 43.0–77.0)
Platelets: 363 10*3/uL (ref 150.0–400.0)
RBC: 4.39 Mil/uL (ref 3.87–5.11)
RDW: 14.3 % (ref 11.5–15.5)
WBC: 8.1 10*3/uL (ref 4.0–10.5)

## 2018-09-06 LAB — COMPREHENSIVE METABOLIC PANEL
ALBUMIN: 4.1 g/dL (ref 3.5–5.2)
ALK PHOS: 60 U/L (ref 39–117)
ALT: 17 U/L (ref 0–35)
AST: 16 U/L (ref 0–37)
BUN: 11 mg/dL (ref 6–23)
CHLORIDE: 104 meq/L (ref 96–112)
CO2: 33 mEq/L — ABNORMAL HIGH (ref 19–32)
Calcium: 9.4 mg/dL (ref 8.4–10.5)
Creatinine, Ser: 0.77 mg/dL (ref 0.40–1.20)
GFR: 78.62 mL/min (ref >60.00)
Glucose, Bld: 76 mg/dL (ref 70–99)
Potassium: 3.4 mEq/L — ABNORMAL LOW (ref 3.5–5.1)
Sodium: 144 mEq/L (ref 135–145)
TOTAL PROTEIN: 7.1 g/dL (ref 6.0–8.3)
Total Bilirubin: 0.4 mg/dL (ref 0.2–1.2)

## 2018-09-06 LAB — AMYLASE: AMYLASE: 15 U/L — AB (ref 27–131)

## 2018-09-06 LAB — LIPASE: Lipase: <3 U/L — ABNORMAL LOW (ref 11.0–59.0)

## 2018-09-06 NOTE — Progress Notes (Signed)
@Patient  ID: [name], female    DOB: [birth year], 74 y.o.   MRN: [id number]  Chief Complaint  Patient presents with  . Acute Visit    abdominal pain x2weeks    Referring provider: Tanda Rockers, MD  HPI: 73 year old female, never smoked. PMH significant for HTN, OSA, diverticulosis, common bile duct stone, elevated LFTs, GERD. Hx cholecystectomy in 1980s. Patient of Dr. Melvyn Novas, last seen 05/20/18. She has been seen by Dr. Fuller Plan with GI, had ERCP in February 2019 at [facility].   09/06/2018 Patient presents today for an acute visit with complaints of intermittent epigastric pain afer eating for the last 2 weeks. She states that bentyl was helping until 2 days ago. Not as bad now because she hasn't been eating. Only taking in crackers and ginger ale. Some associated diarrhea which is not abnormal for her. Last BM was 3 hours ago and described as soft. Low grade temp 99.2 last night. Took tylenol. Still taking Protonix twice daily. Only takes pepcid as needed. Denies chest pain, nausea, vomiting or blood in stools.    Allergies  Allergen Reactions  . Epinephrine     REACTION: tachycardia    Immunization History  Administered Date(s) Administered  . Influenza Split 05/24/2013, 05/10/2015, 05/23/2016  . Influenza Whole 06/24/2009, 05/24/2010, 05/25/2011, 05/24/2012, 05/17/2017, 04/24/2018  . Influenza,inj,Quad PF,6+ Mos 06/07/2014  . Influenza-Unspecified 06/09/2017  . Pneumococcal Conjugate-13 04/27/2014  . Pneumococcal Polysaccharide-23 05/24/2008, 05/17/2012  . Td 05/23/2009    Past Medical History:  Diagnosis Date  . Allergic rhinitis   . Allergy   . Anxiety   . Anxiety and depression   . Arthritis   . Asthma    years ago-1993-94  . Barrett's esophagus 05/2005  . Blood transfusion without reported diagnosis    as baby  . Cancer ([place])    Basal cell face  . Cataract    bilateral removal  . Depression   . Diverticulosis   . Esophageal stricture   . Gastric polyps     . GERD (gastroesophageal reflux disease)   . Hiatal hernia   . Hypertension    on meds  . Morbid obesity ([place])   . OSA (obstructive sleep apnea)   . Osteoporosis   . Pneumonia    many years ago  . Positive PPD   . Sleep apnea    bi pap    Tobacco History: Social History   Tobacco Use  Smoking Status Never Smoker  Smokeless Tobacco Never Used  Tobacco Comment   pt has never smoked but husband smoked   Counseling given: Not Answered Comment: pt has never smoked but husband smoked   Outpatient Medications Prior to Visit  Medication Sig Dispense Refill  . acetaminophen (TYLENOL) 325 MG tablet Take 650 mg by mouth every 6 (six) hours as needed. For pain    . ALPRAZolam (XANAX) 0.25 MG tablet Take 0.125 mg by mouth 3 (three) times daily as needed. For anxiety    . buPROPion (WELLBUTRIN XL) 300 MG 24 hr tablet Take 300 mg by mouth daily.    . Calcium Carbonate-Vitamin D 600-400 MG-UNIT per tablet Take 1 tablet by mouth daily.      [name] dicyclomine (BENTYL) 10 MG capsule Take 1 capsule (10 mg total) by mouth 3 (three) times daily as needed for spasms. 90 capsule 11  . fluconazole (DIFLUCAN) 200 MG tablet Take 200 mg by mouth 2 (two) times a week.    . fluticasone (FLONASE) 50 MCG/ACT  nasal spray PLACE 2 SPRAYS INTO BOTH NOSTRILS DAILY (Patient taking differently: Place 2 sprays into both nostrils at bedtime. ) 16 g 11  . mirabegron ER (MYRBETRIQ) 25 MG TB24 tablet Take 1 tablet by mouth daily.    . Multiple Vitamin (MULTIVITAMIN WITH MINERALS) TABS tablet Take 1 tablet by mouth daily.    . naproxen (NAPROSYN) 500 MG tablet Take 500 mg by mouth daily as needed.    . pantoprazole (PROTONIX) 40 MG tablet TAKE 1 TABLET TWICE DAILY. (30 MINUTES BEFORE 1ST AND LAST MEAL OF THE DAY) 60 tablet 4  . tolterodine (DETROL LA) 4 MG 24 hr capsule Take 4 mg by mouth at bedtime.    [name] VIIBRYD 40 MG TABS Take 40 mg by mouth daily.  3  . famotidine (PEPCID) 20 MG tablet Take 20 mg by mouth at bedtime  as needed. For allergies    . triamterene-hydrochlorothiazide (MAXZIDE-25) 37.5-25 MG tablet 1 daily as needed for swelling (Patient not taking: Reported on 09/06/2018) 30 tablet 11   No facility-administered medications prior to visit.     Review of Systems  Review of Systems  Constitutional: Positive for appetite change and fever.  HENT: Negative.   Respiratory: Negative for cough, shortness of breath and wheezing.   Cardiovascular: Negative.  Negative for chest pain.  Gastrointestinal: Positive for abdominal pain. Negative for blood in stool, constipation, diarrhea, nausea and vomiting.    Physical Exam  BP (!) 154/74   Pulse (!) 58   Temp 98.2 F (36.8 C)   Ht 5' 2"  (1.575 m)   Wt 185 lb 6.4 oz (84.1 kg)   SpO2 96%   BMI 33.91 kg/m  Physical Exam Constitutional:      General: She is not in acute distress.    Appearance: Normal appearance. She is obese. She is not ill-appearing.  HENT:     Head: Normocephalic and atraumatic.     Right Ear: Tympanic membrane normal.     Left Ear: Tympanic membrane normal.     Nose: Nose normal.     Mouth/Throat:     Mouth: Mucous membranes are moist.     Pharynx: Oropharynx is clear.  Cardiovascular:     Rate and Rhythm: Normal rate and regular rhythm.  Pulmonary:     Effort: Pulmonary effort is normal. No respiratory distress.     Breath sounds: Normal breath sounds. No wheezing, rhonchi or rales.  Abdominal:     General: There is no distension.     Palpations: Abdomen is soft. There is no mass.     Tenderness: There is abdominal tenderness. There is no rebound.     Hernia: No hernia is present.     Comments: Tender to epigastric region  Musculoskeletal: Normal range of motion.  Skin:    General: Skin is warm and dry.  Neurological:     Mental Status: She is alert.  Psychiatric:        Mood and Affect: Mood normal.        Behavior: Behavior normal.        Thought Content: Thought content normal.        Judgment: Judgment  normal.      Lab Results:  CBC    Component Value Date/Time   WBC 8.1 09/06/2018 1429   RBC 4.39 09/06/2018 1429   HGB 12.8 09/06/2018 1429   HCT 39.0 09/06/2018 1429   PLT 363.0 09/06/2018 1429   MCV 88.8 09/06/2018 1429   MCH 29.8  10/28/2011 1335   MCHC 32.9 09/06/2018 1429   RDW 14.3 09/06/2018 1429   LYMPHSABS 2.6 09/06/2018 1429   MONOABS 0.9 09/06/2018 1429   EOSABS 0.1 09/06/2018 1429   BASOSABS 0.1 09/06/2018 1429    BMET    Component Value Date/Time   NA 144 09/06/2018 1429   K 3.4 (L) 09/06/2018 1429   CL 104 09/06/2018 1429   CO2 33 (H) 09/06/2018 1429   GLUCOSE 76 09/06/2018 1429   BUN 11 09/06/2018 1429   CREATININE 0.77 09/06/2018 1429   CALCIUM 9.4 09/06/2018 1429   GFRNONAA 93.07 06/03/2010 0906   GFRAA  11/03/2008 2125    >60        The eGFR has been calculated using the MDRD equation. This calculation has not been validated in all clinical situations. eGFR's persistently <60 mL/min signify possible Chronic Kidney Disease.    BNP No results found for: BNP  ProBNP    Component Value Date/Time   PROBNP 17.0 01/11/2012 1606    Imaging: No results found.   Assessment & Plan:   Epigastric pain - Epigastric pain after eating x 2 weeks - Symptoms most consistent with stomach ulcer versus gastritis  - CBC and LFTs normal. Amylase and Lipase not elevated.  - If elevated will order abdominal ultrasound  - Continue PPI twice daily. Instructed to resume Pepcid QHS and take as needed Maalox - Has GI apt on 1/28   Essential hypertension - BP 154/74 today, not currently on medical regimen - Instructed patient to Monitor BP, if remains elevated >140/90 please call office or return    Martyn Ehrich, NP 09/06/2018

## 2018-09-06 NOTE — Assessment & Plan Note (Addendum)
-   Epigastric pain after eating x 2 weeks - Symptoms most consistent with stomach ulcer versus gastritis  - CBC and LFTs normal. Amylase and Lipase not elevated.  - If elevated will order abdominal ultrasound  - Continue PPI twice daily. Instructed to resume Pepcid QHS and take as needed Maalox - Has GI apt on 1/28

## 2018-09-06 NOTE — Progress Notes (Signed)
Chart and office note reviewed in detail  > agree with a/p as outlined    

## 2018-09-06 NOTE — Patient Instructions (Signed)
Monitor BP, if remains elevated >140/90 please call office or return  - You can get a manual blood pressure cuff or check at [facility]   Resume pepcid at bedtime for the next 2-3 weeks   Take as needed maalox   Try BRAT diet (bannana, rice, applesauce and toast). Stay well hydrated, goal eight 8oz glasses of fluid day   Avoid heavy cream, milk and dairy products for the time being   Labs today  Follow up with GI on 1/28

## 2018-09-06 NOTE — Assessment & Plan Note (Signed)
-   BP 154/74 today, not currently on medical regimen - Instructed patient to Monitor BP, if remains elevated >140/90 please call office or return

## 2018-09-16 ENCOUNTER — Telehealth: Payer: Self-pay

## 2018-09-16 NOTE — Telephone Encounter (Signed)
Pantoprazole 40 mg twice daily was approved with [facility] until 09/15/19.

## 2018-09-20 ENCOUNTER — Encounter: Payer: Self-pay | Admitting: Gastroenterology

## 2018-09-20 ENCOUNTER — Ambulatory Visit (INDEPENDENT_AMBULATORY_CARE_PROVIDER_SITE_OTHER): Payer: Commercial Managed Care - PPO | Admitting: Gastroenterology

## 2018-09-20 VITALS — BP 176/74 | HR 76 | Ht 61.0 in | Wt 185.8 lb

## 2018-09-20 DIAGNOSIS — R1033 Periumbilical pain: Secondary | ICD-10-CM | POA: Diagnosis not present

## 2018-09-20 DIAGNOSIS — R197 Diarrhea, unspecified: Secondary | ICD-10-CM

## 2018-09-20 DIAGNOSIS — K580 Irritable bowel syndrome with diarrhea: Secondary | ICD-10-CM | POA: Diagnosis not present

## 2018-09-20 DIAGNOSIS — R634 Abnormal weight loss: Secondary | ICD-10-CM

## 2018-09-20 NOTE — Progress Notes (Signed)
    History of Present Illness: This is a 73 year old female who relates increasingly frequent episodes of periumbilical abdominal pain associated with urgent diarrhea.  Her symptoms generally occur following meals.  She has had an intentional 25 pound weight loss over the past several months.  Dicyclomine has substantially improved her symptoms although it led to mild constipation.  Last colonoscopy was performed in November 2015 showing diverticulosis.  Blood work from January 14 reviewed K=3.4, CO2=33 and remainder of CMP normal.  CBC normal.  Amylase and lipase were both below the lower limit of normal at 15 and < 3 respectively  Current Medications, Allergies, Past Medical History, Past Surgical History, Family History and Social History were reviewed in [employer] record.  Physical Exam: General: Well developed, well nourished, no acute distress Head: Normocephalic and atraumatic Eyes:  sclerae anicteric, EOMI Ears: Normal auditory acuity Mouth: No deformity or lesions Lungs: Clear throughout to auscultation Heart: Regular rate and rhythm; no murmurs, rubs or bruits Abdomen: Soft, non tender and non distended. No masses, hepatosplenomegaly noted. Lower well healed midline incision, ventral hernia left of midline Normal Bowel sounds Rectal:  Not done Musculoskeletal: Symmetrical with no gross deformities  Pulses:  Normal pulses noted Extremities: No clubbing, cyanosis, edema or deformities noted Neurological: Alert oriented x 4, grossly nonfocal Psychological:  Alert and cooperative. Normal mood and affect   Assessment and Recommendations:  1. Suspected IBS-D.  IBgard 1-2 po tid ac. DC dicyclomine.  If symptoms are not resolved, or at least substantially improved, over the next few weeks proceed with further evaluation to include GI pathogen panel, fecal elastase, tTG, IgA and  consider imaging and colonoscopy. REV in 1 month.   2. GERD.  Continue pantoprazole  40 mg twice daily and famotidine 20 mg at bedtime.  3. Intentional weight loss.  4. Ventral hernia.

## 2018-09-20 NOTE — Patient Instructions (Signed)
Start the samples of IBgard 1-2 capsules by mouth three times a day before meals. This medication is over the counter.   Thank you for choosing me and [facility].  Pricilla Riffle. Dagoberto Ligas., MD., Mar[facility] Regal

## 2018-10-20 ENCOUNTER — Ambulatory Visit: Payer: Self-pay | Admitting: Gastroenterology

## 2018-10-31 ENCOUNTER — Other Ambulatory Visit: Payer: Self-pay | Admitting: Gastroenterology

## 2018-11-01 ENCOUNTER — Encounter (HOSPITAL_COMMUNITY): Payer: Self-pay

## 2018-11-01 ENCOUNTER — Encounter: Payer: Self-pay | Admitting: Vascular Surgery

## 2018-11-10 IMAGING — DX DG CHEST 2V
2 series · 2 of 2 positions shown · non-contrast
Comparison: Prior radiograph from 12/24/2015

CLINICAL DATA: Initial evaluation for intermittent posterior right
lower chest pain for 3 weeks. History of asthma.

EXAM:
CHEST  2 VIEW

[chest pa]
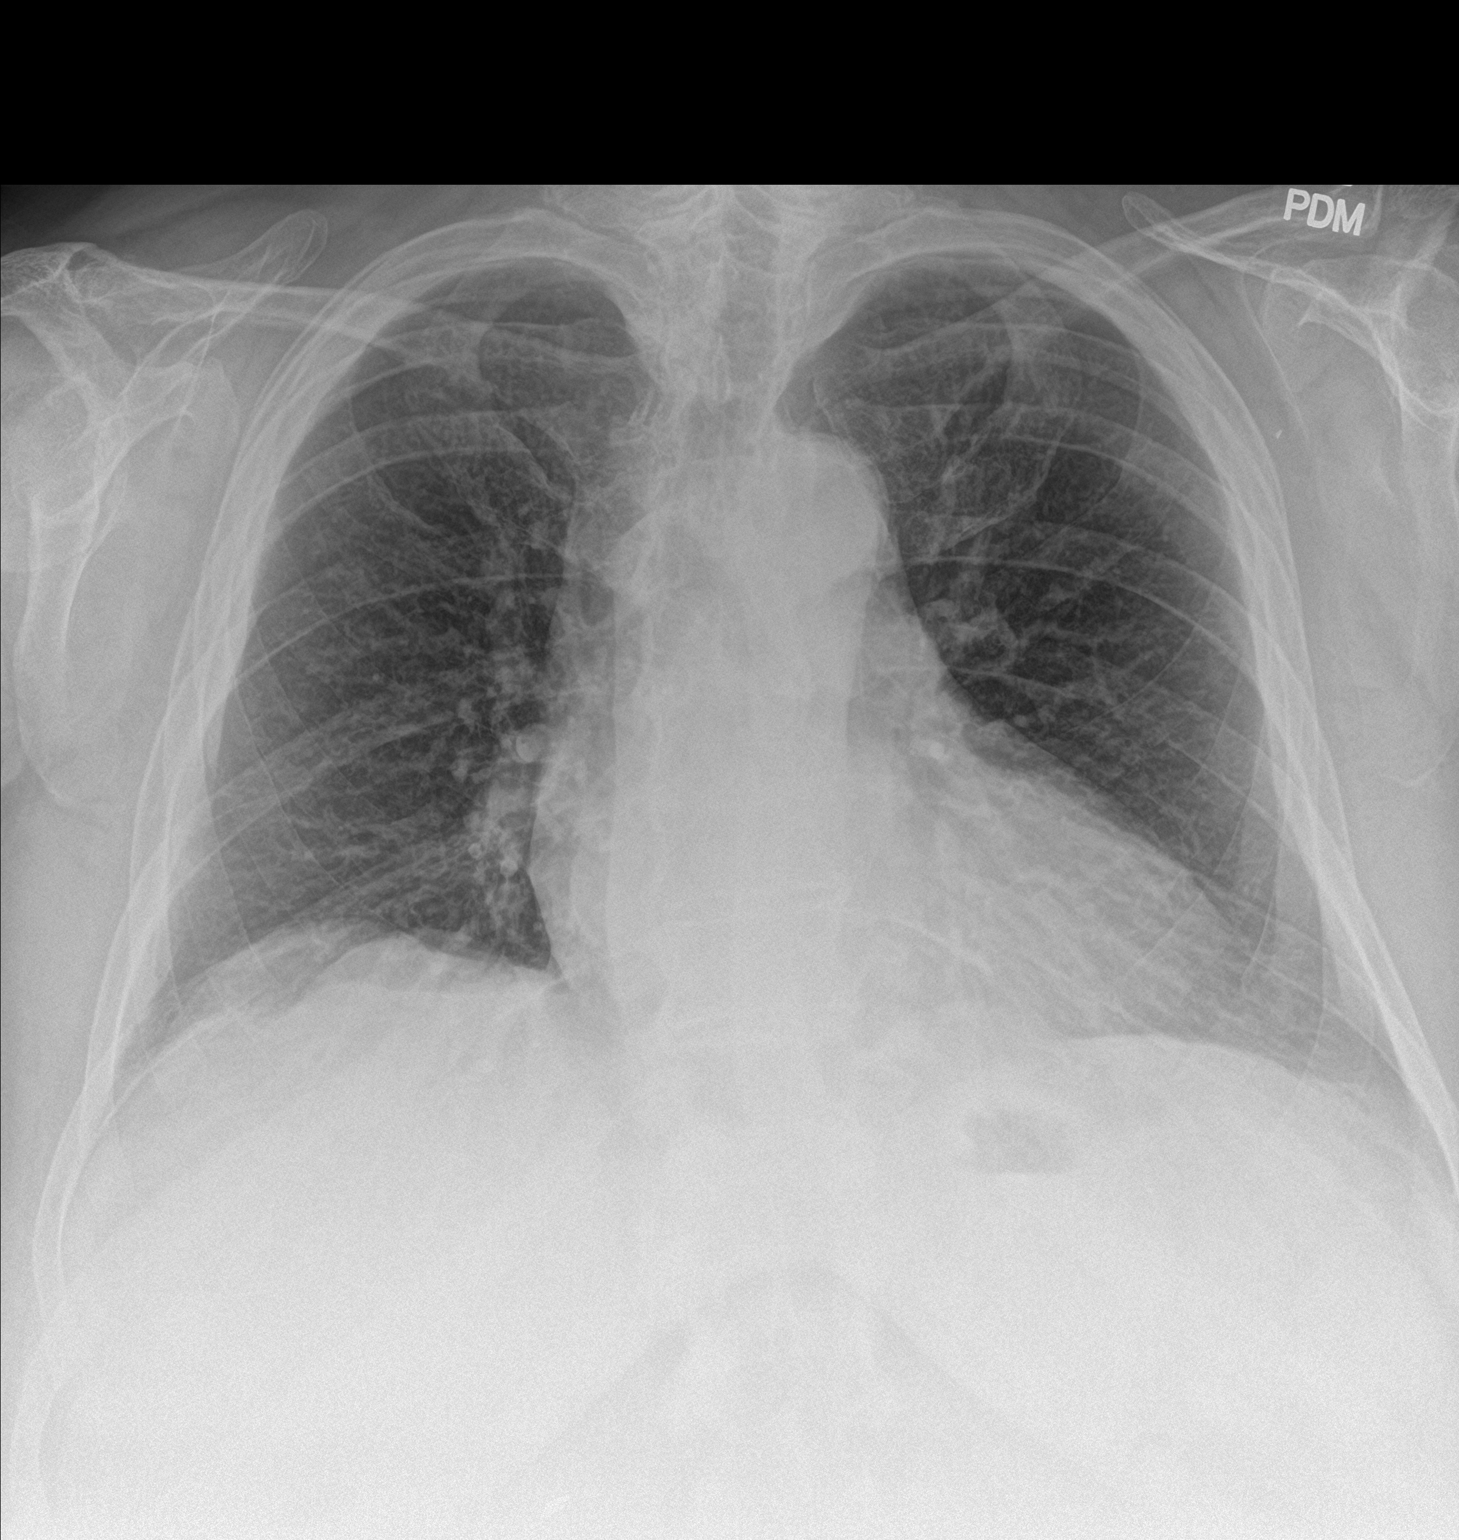

[chest lat]
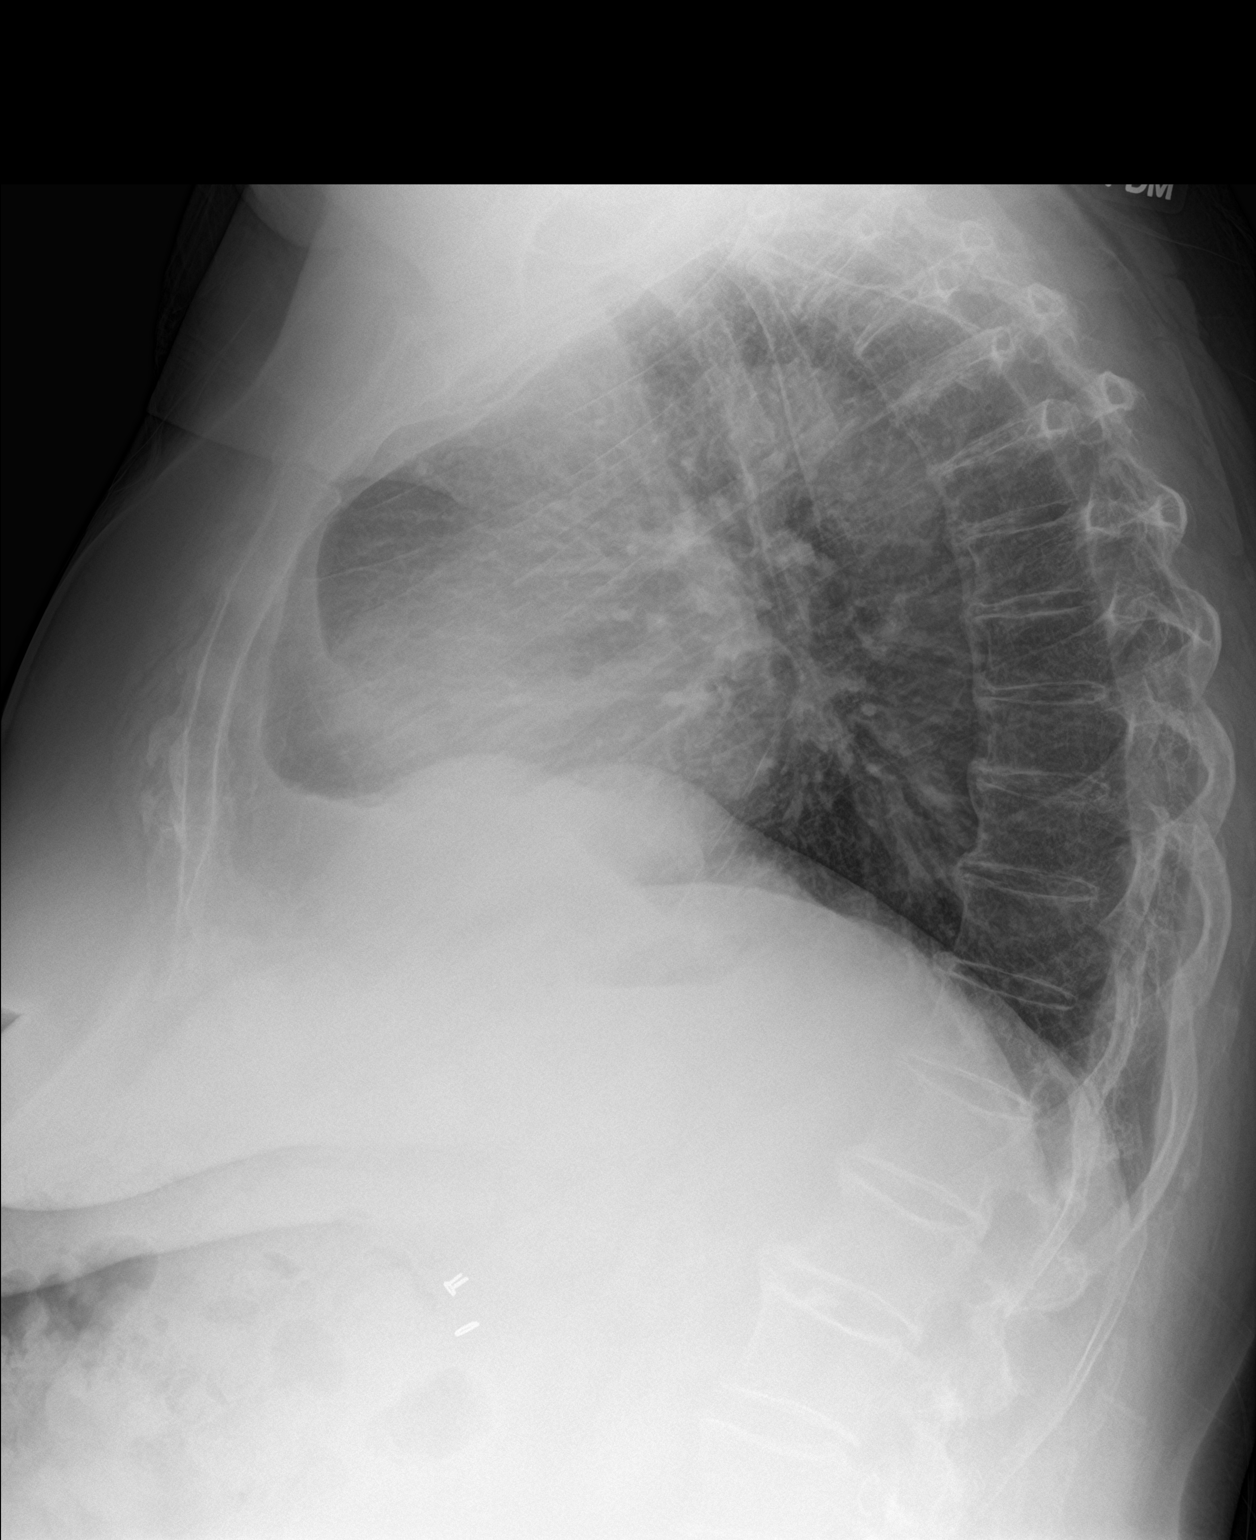

[2 of 2 positions shown; findings below may reference images not displayed]

FINDINGS: Mild cardiomegaly, stable. Mediastinal silhouette within normal
limits.

Lungs are normally inflated. Mild diffuse peribronchial thickening,
stable from previous. No consolidative airspace disease. No
pulmonary edema or pleural effusion. No pneumothorax.

No acute osseous abnormality. Mild multilevel degenerative endplate
spurring present within the visualized spine.
IMPRESSION: 1. Mild diffuse peribronchial thickening, similar to previous, and
felt to most likely be related to history of asthma. Superimposed
acute bronchiolitis could be considered in the correct clinical
setting.
2. No other active cardiopulmonary disease.
3. Mild cardiomegaly, stable from prior.  No pulmonary edema.

## 2018-11-11 ENCOUNTER — Ambulatory Visit (HOSPITAL_COMMUNITY): Payer: Commercial Managed Care - PPO

## 2018-11-11 ENCOUNTER — Encounter: Payer: Self-pay | Admitting: Vascular Surgery

## 2018-12-15 ENCOUNTER — Telehealth: Payer: Self-pay | Admitting: Internal Medicine

## 2018-12-15 DIAGNOSIS — M26629 Arthralgia of temporomandibular joint, unspecified side: Secondary | ICD-10-CM

## 2018-12-15 NOTE — Telephone Encounter (Signed)
Oral surgery  Dr [facility]

## 2018-12-15 NOTE — Telephone Encounter (Signed)
Call made to patient, made aware of MW recommendations. Voiced understanding. Made aware due to corona new consults may be delayed. Voiced understanding. Referral order placed. Nothing further is needed at this time.

## 2018-12-15 NOTE — Telephone Encounter (Signed)
Call returned to patient, she is wanting a referral for Temporomandibular Joint Pain. I made patient aware I would touch bases with Dr. Melvyn Novas. She spoke with her ENT office and they told her they do not treat patients for TMJ.   MW please advise what type of referral is needed for this.

## 2019-01-03 ENCOUNTER — Ambulatory Visit: Payer: Commercial Managed Care - PPO | Admitting: Internal Medicine

## 2019-01-03 DIAGNOSIS — H9202 Otalgia, left ear: Secondary | ICD-10-CM | POA: Insufficient documentation

## 2019-01-19 ENCOUNTER — Telehealth (HOSPITAL_COMMUNITY): Payer: Self-pay

## 2019-01-19 ENCOUNTER — Other Ambulatory Visit: Payer: Self-pay

## 2019-01-19 DIAGNOSIS — I872 Venous insufficiency (chronic) (peripheral): Secondary | ICD-10-CM

## 2019-01-19 DIAGNOSIS — I83893 Varicose veins of bilateral lower extremities with other complications: Secondary | ICD-10-CM

## 2019-01-19 NOTE — Telephone Encounter (Signed)
The above patient or their representative was contacted and gave the following answers to these questions:         Do you have any of the following symptoms?  NO  Fever                    Cough                   Shortness of breath  Do  you have any of the following other symptoms?    muscle pain         vomiting,        diarrhea        rash         weakness        red eye        abdominal pain         bruising          bruising or bleeding              joint pain           severe headache    Have you been in contact with someone who was or has been sick in the past 2 weeks?  NO  Yes                 Unsure                         Unable to assess   Does the person that you were in contact with have any of the following symptoms?   Cough         shortness of breath           muscle pain         vomiting,            diarrhea            rash            weakness           fever            red eye           abdominal pain           bruising  or  bleeding                joint pain                severe headache               Have you  or someone you have been in contact with traveled internationally in th last month?  NO       If yes, which countries?   Have you  or someone you have been in contact with traveled outside [place] in th last month?   NO      If yes, which state and city?   COMMENTS OR ACTION PLAN FOR THIS PATIENT:         

## 2019-01-20 ENCOUNTER — Other Ambulatory Visit: Payer: Self-pay

## 2019-01-20 ENCOUNTER — Ambulatory Visit (INDEPENDENT_AMBULATORY_CARE_PROVIDER_SITE_OTHER): Payer: Commercial Managed Care - PPO | Admitting: Vascular Surgery

## 2019-01-20 ENCOUNTER — Encounter: Payer: Self-pay | Admitting: Vascular Surgery

## 2019-01-20 ENCOUNTER — Ambulatory Visit (HOSPITAL_COMMUNITY)
Admission: RE | Admit: 2019-01-20 | Discharge: 2019-01-20 | Disposition: A | Discharge status: Home / Self Care | Payer: Commercial Managed Care - PPO | Source: Ambulatory Visit | Attending: Vascular Surgery | Admitting: Vascular Surgery

## 2019-01-20 VITALS — BP 156/72 | HR 60 | Temp 97.5°F | Resp 18 | Ht 61.0 in | Wt 178.3 lb

## 2019-01-20 DIAGNOSIS — I781 Nevus, non-neoplastic: Secondary | ICD-10-CM | POA: Diagnosis not present

## 2019-01-20 DIAGNOSIS — I83893 Varicose veins of bilateral lower extremities with other complications: Secondary | ICD-10-CM

## 2019-01-20 NOTE — Progress Notes (Signed)
Patient ID: [name], female   DOB: April 06, 1946, 73 y.o.   MRN: [id number]  Reason for Consult: New Patient (Initial Visit)   Referred by Tanda Rockers, MD  Subjective:     HPI:  [name] is a 73 y.o. female history of bilateral lower extremity varicose veins spider veins and minimal swelling.  She has never had blood clots.  Family does not have history of spider veins or varicosities.  She is never had any bleeding or complications from these.  They cause no pain.  She is not concerned about the cosmesis.  Past Medical History:  Diagnosis Date  . Allergic rhinitis   . Allergy   . Anxiety   . Anxiety and depression   . Arthritis   . Asthma    years ago-1993-94  . Barrett's esophagus 05/2005  . Blood transfusion without reported diagnosis    as baby  . Cancer ([place])    Basal cell face  . Cataract    bilateral removal  . Depression   . Diverticulosis   . Esophageal stricture   . Gastric polyps   . GERD (gastroesophageal reflux disease)   . Hiatal hernia   . Hypertension    on meds  . Morbid obesity ([place])   . OSA (obstructive sleep apnea)   . Osteoporosis   . Pneumonia    many years ago  . Positive PPD   . Sleep apnea    bi pap   Family History  Problem Relation Age of Onset  . Coronary artery disease Father   . Heart failure Father   . Allergies Mother   . Alzheimer's disease Mother   . Heart failure Mother   . Hypertension Brother        x 3  . Sleep apnea Brother   . Sleep apnea Son   . Colon cancer Maternal Uncle   . Lung cancer Paternal Grandfather   . Diabetes Son   . Heart attack Paternal Grandmother   . Rectal cancer Neg Hx   . Stomach cancer Neg Hx   . Esophageal cancer Neg Hx   . Pancreatic cancer Neg Hx    Past Surgical History:  Procedure Laterality Date  . CESAREAN SECTION     x 2  . CHOLECYSTECTOMY    . COLONOSCOPY    . ENDOSCOPIC RETROGRADE CHOLANGIOPANCREATOGRAPHY (ERCP) WITH PROPOFOL N/A 09/17/2017   Procedure:  ENDOSCOPIC RETROGRADE CHOLANGIOPANCREATOGRAPHY (ERCP) WITH PROPOFOL;  Surgeon: Ladene Artist, MD;  Location: [facility] ENDOSCOPY;  Service: Endoscopy;  Laterality: N/A;  . FINGER SURGERY     cyst removel from right index finger  . FOOT FRACTURE SURGERY     right   . FOOT NEUROMA SURGERY     left   . TONSILLECTOMY    . TUBAL LIGATION  1981  . UPPER GASTROINTESTINAL ENDOSCOPY    . WRIST FRACTURE SURGERY     right     Short Social History:  Social History   Tobacco Use  . Smoking status: Never Smoker  . Smokeless tobacco: Never Used  . Tobacco comment: pt has never smoked but husband smoked  Substance Use Topics  . Alcohol use: No    Allergies  Allergen Reactions  . Epinephrine     REACTION: tachycardia    Current Outpatient Medications  Medication Sig Dispense Refill  . acetaminophen (TYLENOL) 325 MG tablet Take 650 mg by mouth every 6 (six) hours as needed. For pain    . ALPRAZolam (  XANAX) 0.25 MG tablet Take 0.125 mg by mouth 3 (three) times daily as needed. For anxiety    . buPROPion (WELLBUTRIN XL) 300 MG 24 hr tablet Take 300 mg by mouth daily.    . Calcium Carbonate-Vitamin D 600-400 MG-UNIT per tablet Take 1 tablet by mouth daily.      [name] dicyclomine (BENTYL) 10 MG capsule Take 1 capsule (10 mg total) by mouth 3 (three) times daily as needed for spasms. 90 capsule 11  . famotidine (PEPCID) 20 MG tablet Take 20 mg by mouth at bedtime as needed. For allergies    . fluconazole (DIFLUCAN) 200 MG tablet Take 200 mg by mouth 2 (two) times a week.    . fluticasone (FLONASE) 50 MCG/ACT nasal spray PLACE 2 SPRAYS INTO BOTH NOSTRILS DAILY (Patient taking differently: Place 2 sprays into both nostrils at bedtime. ) 16 g 11  . mirabegron ER (MYRBETRIQ) 25 MG TB24 tablet Take 1 tablet by mouth daily.    . Multiple Vitamin (MULTIVITAMIN WITH MINERALS) TABS tablet Take 1 tablet by mouth daily.    . naproxen (NAPROSYN) 500 MG tablet Take 500 mg by mouth daily as needed.    . pantoprazole  (PROTONIX) 40 MG tablet TAKE 1 TABLET TWICE DAILY. (30 MINUTES BEFORE 1ST AND LAST MEAL OF THE DAY) 60 tablet 11  . tolterodine (DETROL LA) 4 MG 24 hr capsule Take 4 mg by mouth at bedtime.    . triamterene-hydrochlorothiazide (MAXZIDE-25) 37.5-25 MG tablet 1 daily as needed for swelling 30 tablet 11  . VIIBRYD 40 MG TABS Take 20 mg by mouth daily.   3   No current facility-administered medications for this visit.     Review of Systems  Constitutional:  Constitutional negative. HENT: HENT negative.  Eyes: Eyes negative.  Respiratory: Respiratory negative.  Cardiovascular: Positive for leg swelling.  GI: Gastrointestinal negative.  Musculoskeletal: Musculoskeletal negative.  Skin: Skin negative.  Neurological: Neurological negative. Hematologic: Hematologic/lymphatic negative.  Psychiatric: Psychiatric negative.        Objective:  Objective   Vitals:   01/20/19 1002  BP: (!) 156/72  Pulse: 60  Resp: 18  Temp: (!) 97.5 F (36.4 C)  SpO2: 95%  Weight: 178 lb 4.8 oz (80.9 kg)  Height: 5\' 1"  (1.549 m)   Body mass index is 33.69 kg/m.  Physical Exam Constitutional:      Appearance: Normal appearance.  HENT:     Mouth/Throat:     Mouth: Mucous membranes are moist.  Eyes:     Pupils: Pupils are equal, round, and reactive to light.  Cardiovascular:     Rate and Rhythm: Normal rate.     Pulses:          Radial pulses are 2+ on the right side and 2+ on the left side.       Dorsalis pedis pulses are 2+ on the right side and 2+ on the left side.       Posterior tibial pulses are 2+ on the right side and 2+ on the left side.  Pulmonary:     Effort: Pulmonary effort is normal.  Abdominal:     General: Abdomen is flat.     Palpations: Abdomen is soft.  Musculoskeletal:     Comments: Trace right foot edema  Skin:    Capillary Refill: Capillary refill takes less than 2 seconds.     Comments: Multiple areas of large telangiectasias right thigh and leg as well as lateral  left leg  Neurological:  General: No focal deficit present.     Mental Status: She is alert and oriented to person, place, and time.  Psychiatric:        Mood and Affect: Mood normal.        Behavior: Behavior normal.     Data: I independently interpreted her lower extremity reflux study.  She has deep venous reflux bilateral common femoral veins.  On the left side she has superficial reflux at the junction in the proximal calf and on the right side in the mid calf greater saphenous vein and small saphenous vein proximally.  Vein measures up to 0.97 cm on the right 0.86 cm on the left.  Distally at the mid thigh 0.42 cm on the right 0.34 cm on the left.     Assessment/Plan:     73 year old female with C1 venous disease.  I reviewed her reflux study with her today there is really no opportunity for intervention.  She can wear gentle compression as needed.  I have offered her sclerotherapy but she does not want to consider at this time.  She can call back in the future and could be evaluated for sclerotherapy.     Waynetta Sandy MD [facility]

## 2019-01-24 ENCOUNTER — Ambulatory Visit (INDEPENDENT_AMBULATORY_CARE_PROVIDER_SITE_OTHER): Payer: Commercial Managed Care - PPO

## 2019-01-24 ENCOUNTER — Other Ambulatory Visit: Payer: Self-pay

## 2019-01-24 ENCOUNTER — Telehealth: Payer: Self-pay

## 2019-01-24 ENCOUNTER — Encounter: Payer: Self-pay | Admitting: Internal Medicine

## 2019-01-24 ENCOUNTER — Ambulatory Visit (INDEPENDENT_AMBULATORY_CARE_PROVIDER_SITE_OTHER): Payer: Commercial Managed Care - PPO | Admitting: Internal Medicine

## 2019-01-24 VITALS — BP 120/72 | HR 72 | Temp 97.8°F | Ht 62.0 in | Wt 174.0 lb

## 2019-01-24 DIAGNOSIS — Z23 Encounter for immunization: Secondary | ICD-10-CM

## 2019-01-24 DIAGNOSIS — R109 Unspecified abdominal pain: Secondary | ICD-10-CM

## 2019-01-24 DIAGNOSIS — E7800 Pure hypercholesterolemia, unspecified: Secondary | ICD-10-CM

## 2019-01-24 DIAGNOSIS — I10 Essential (primary) hypertension: Secondary | ICD-10-CM

## 2019-01-24 DIAGNOSIS — G4733 Obstructive sleep apnea (adult) (pediatric): Secondary | ICD-10-CM

## 2019-01-24 LAB — HEPATIC FUNCTION PANEL
ALT: 20 U/L (ref 0–35)
AST: 16 U/L (ref 0–37)
Albumin: 4.3 g/dL (ref 3.5–5.2)
Alkaline Phosphatase: 63 U/L (ref 39–117)
Bilirubin, Direct: 0.1 mg/dL (ref 0.0–0.3)
Total Bilirubin: 0.4 mg/dL (ref 0.2–1.2)
Total Protein: 7.3 g/dL (ref 6.0–8.3)

## 2019-01-24 LAB — CBC WITH DIFFERENTIAL/PLATELET
Basophils Absolute: 0.1 10*3/uL (ref 0.0–0.1)
Basophils Relative: 1 % (ref 0.0–3.0)
Eosinophils Absolute: 0.1 10*3/uL (ref 0.0–0.7)
Eosinophils Relative: 0.7 % (ref 0.0–5.0)
HCT: 41.9 % (ref 36.0–46.0)
Hemoglobin: 14.2 g/dL (ref 12.0–15.0)
Lymphocytes Relative: 27.8 % (ref 12.0–46.0)
Lymphs Abs: 2.7 10*3/uL (ref 0.7–4.0)
MCHC: 33.8 g/dL (ref 30.0–36.0)
MCV: 89.5 fl (ref 78.0–100.0)
Monocytes Absolute: 0.6 10*3/uL (ref 0.1–1.0)
Monocytes Relative: 6.8 % (ref 3.0–12.0)
Neutro Abs: 6.1 10*3/uL (ref 1.4–7.7)
Neutrophils Relative %: 63.7 % (ref 43.0–77.0)
Platelets: 391 10*3/uL (ref 150.0–400.0)
RBC: 4.69 Mil/uL (ref 3.87–5.11)
RDW: 16 % — ABNORMAL HIGH (ref 11.5–15.5)
WBC: 9.6 10*3/uL (ref 4.0–10.5)

## 2019-01-24 LAB — LIPID PANEL
Cholesterol: 211 mg/dL — ABNORMAL HIGH (ref 0–200)
HDL: 64.5 mg/dL (ref >39.00)
LDL Cholesterol: 130 mg/dL — ABNORMAL HIGH (ref 0–99)
NonHDL: 146.41
Total CHOL/HDL Ratio: 3
Triglycerides: 81 mg/dL (ref 0.0–149.0)
VLDL: 16.2 mg/dL (ref 0.0–40.0)

## 2019-01-24 LAB — BASIC METABOLIC PANEL
BUN: 13 mg/dL (ref 6–23)
CO2: 34 mEq/L — ABNORMAL HIGH (ref 19–32)
Calcium: 9.6 mg/dL (ref 8.4–10.5)
Chloride: 100 mEq/L (ref 96–112)
Creatinine, Ser: 0.62 mg/dL (ref 0.40–1.20)
GFR: 94.88 mL/min (ref >60.00)
Glucose, Bld: 87 mg/dL (ref 70–99)
Potassium: 3.8 mEq/L (ref 3.5–5.1)
Sodium: 140 mEq/L (ref 135–145)

## 2019-01-24 LAB — TSH: TSH: 0.91 u[IU]/mL (ref 0.35–4.50)

## 2019-01-24 NOTE — Progress Notes (Signed)
Subjective:   Patient ID: [name], female    DOB: September 16, 1945     MRN: [id number]   Brief patient profile:  67 yowf never smoker with morbid obesity complicated by gastroesophageal reflux disease, and also a history of obstructive sleep apnea and chronic rhinitis/ hbp.   History of Present Illness  04/27/2014 f/u ov/[name] re: obesity/ ohs/hbp/djd  Chief Complaint  Patient presents with  . Annual Exam    Pt fasting. Overall doing well and denies any co's today.  no reg exercise, very tired in pms  > working out issues of sleep with [name] but he has not reviewed download yet rec Please remember to go to the lab and x-ray department downstairs for your tests - we will call you with the results when they are available. Prevnar today  Dr [name] will be in touch re your bipap settings    05/20/2018  f/u ov/[name] re:  [name] using ppi bid ac but not pepcid / new hoarseness x 72m/ plus hbp/ MO Chief Complaint  Patient presents with  . Acute Visit    Hoarseness off and on for the past 4 months.   Dyspnea:  Not limited by breathing from desired activities  / no aerobic at all now  Cough:clearing throat more s pattern but  not noct/ notes more at hosp with voice use  Sleeping: cpap flat one pillow rec Add pepcid 20 mg at bedtime GERD diet  Please see patient coordinator before you leave today  to schedule ENT eval for hoarseness>  Saw [name] 06/07/18  dx chronic nasopharyngitis and dysphonia> improved with abx/ ns irrigation   08/02/2018  f/u ov/[name] re: hbp /gerd/ ha generalized  In pm's better p tylenol  Chief Complaint  Patient presents with  . Follow-up    Doing well overall. She has HA on days that it rains.   Dyspnea:  Not limited by breathing from desired activities  / busy helping take care of son with disabilities with new tension pattern HA  Cough: none now  Sleeping: bipap/ flat on one pillow  rec No change in medications - remember elastic hose    01/24/2019  f/u ov/[name]  re:  Cpx:  Hbp, edema, MO  Chief Complaint  Patient presents with  . Anxiety  Dyspnea:  Yardwork/no aerobics Cough: none  Sleeping: cpap per sood  SABA use: none 02: none  Viibryd per [employer] helping anxiety   No obvious day to day or daytime variability or assoc excess/ purulent sputum or mucus plugs or hemoptysis or cp or chest tightness, subjective wheeze or overt sinus or hb symptoms.   Sleeping as above without nocturnal  or early am exacerbation  of respiratory  c/o's or need for noct saba. Also denies any obvious fluctuation of symptoms with weather or environmental changes or other aggravating or alleviating factors except as outlined above   No unusual exposure hx or h/o childhood pna/ asthma or knowledge of premature birth.  Current Allergies, Complete Past Medical History, Past Surgical History, Family History, and Social History were reviewed in [employer] record.  ROS  The following are not active complaints unless bolded Hoarseness, sore throat, dysphagia, dental problems, itching, sneezing,  nasal congestion or discharge of excess mucus or purulent secretions, ear ache,   fever, chills, sweats, unintended wt loss or wt gain, classically pleuritic or exertional cp,  orthopnea pnd or arm/hand swelling  or leg swelling, presyncope, palpitations, abdominal pain, anorexia, nausea, vomiting, diarrhea  or  change in bowel habits or change in bladder habits, change in stools or change in urine, dysuria, hematuria,  rash, arthralgias, visual complaints, headache, numbness, weakness or ataxia or problems with walking or coordination,  change in mood or  memory.        Current Meds  Medication Sig  . acetaminophen (TYLENOL) 325 MG tablet Take 650 mg by mouth every 6 (six) hours as needed. For pain  . ALPRAZolam (XANAX) 0.25 MG tablet Take 0.125 mg by mouth 3 (three) times daily as needed. For anxiety  . buPROPion (WELLBUTRIN XL) 300 MG 24 hr tablet Take 300 mg by  mouth daily.  . Calcium Carbonate-Vitamin D 600-400 MG-UNIT per tablet Take 1 tablet by mouth daily.    [name] dicyclomine (BENTYL) 10 MG capsule Take 1 capsule (10 mg total) by mouth 3 (three) times daily as needed for spasms.  . famotidine (PEPCID) 20 MG tablet Take 20 mg by mouth at bedtime as needed. For allergies  . fluticasone (FLONASE) 50 MCG/ACT nasal spray PLACE 2 SPRAYS INTO BOTH NOSTRILS DAILY (Patient taking differently: Place 2 sprays into both nostrils at bedtime. )  . mirabegron ER (MYRBETRIQ) 25 MG TB24 tablet Take 1 tablet by mouth daily.  . Multiple Vitamin (MULTIVITAMIN WITH MINERALS) TABS tablet Take 1 tablet by mouth daily.  . naproxen (NAPROSYN) 500 MG tablet Take 500 mg by mouth daily as needed.  . pantoprazole (PROTONIX) 40 MG tablet TAKE 1 TABLET TWICE DAILY. ([place] LAST MEAL OF THE DAY)  . tolterodine (DETROL LA) 4 MG 24 hr capsule Take 4 mg by mouth at bedtime.  [name] VIIBRYD 40 MG TABS Take 20 mg by mouth daily.                          Past Medical History:  HIATAL HERNIA (ICD-553.3) with GERD  - H/o Barretts esophagus  - EGD pos HH/ stricture 04/18/10 , neg barrett's  RHINITIS, CHRONIC (ICD-472.0) .................................................................... [name]  - Remote allergy testing = molds/dust only  - Sinus CT 09/06/08 mild thickening only  OBSTRUCTIVE SLEEP APNEA (ICD-327.23)..................................................[name]  --PSG 03/31/02 AHI 64  --Nocturnal BIPAP > titration done 05/16/2015  MORBID OBESITY (ICD-278.01)  - Target wt = 158 for BMI < 30  POSITIVE PPD (ICD-795.5)  DIVERTICULOSIS  Shingles R T5/6 03/2015  Depression / Anxiety ..............................................................[name] Karr Stasis dermatitis ..................................................................... [employer]........................................................Marland KitchenWert  - DT May 23, 2009,    01/24/2019  - Pneumovax 04/2013, Prevnar 04/27/2014, Pneumovax 01/24/2019  - CPX 01/24/2019  - GYN Levoix        Family History:  Father - CAD, ? 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  Never smoker, but Husband smoked / passed 2355 Illicit Drug Use - no  Patient does not get regular exercise.  No etoh          Objective:   Physical Exam  wt 224 May 23, 2009 > 226 September 10, 2009 > 229 November 05, 2009 > 232 June 03, 2010 > 224 05/25/2011 >230 01/11/12 > 03/04/2012  225 > 05/17/2012  219 >  217 07/29/2012 > 219 09/06/2012 >223 03/16/2013 > 04/11/2013  225 > 05/22/2013  225 > 225 06/23/2013 >  02/27/2014 215 > 04/27/2014  215 >  04/24/2015  222>  12/24/2015   214 >  03/02/2016  214 > 07/20/2016  220 > 08/20/2016  219 >  12/24/2016   211 > 02/12/2017  210 > 06/28/2017   207  > 01/14/2018  187  > 05/20/2018  194  > 08/02/2018  196  >  01/24/2019   174  amb obese wf nad     Vital signs reviewed - Note on arrival 02 sats  98% on RA and bp 120/72    HEENT: nl dentition, turbinates bilaterally, and oropharynx. Nl external ear canals without cough reflex   NECK :  without JVD/Nodes/TM/ nl carotid upstrokes bilaterally   LUNGS: no acc muscle use,  Nl contour chest which is clear to A and P bilaterally without cough on insp or exp maneuvers   CV:  RRR  no s3 or murmur or increase in P2, and no edema   ABD:  soft and nontender with nl inspiratory excursion in the supine position. No bruits or organomegaly appreciated, bowel sounds nl  MS:  Nl gait/ ext warm without deformities, calf tenderness, cyanosis or clubbing No obvious joint restrictions   SKIN: warm and dry without lesions    NEURO:   alert, approp, nl sensorium with  no motor or cerebellar deficits apparent.         CXR PA and Lateral:   01/24/2019 :    I personally reviewed images and agree with radiology impression as follows:    No active cardiopulmonary disease.   Labs ordered/ reviewed:      Chemistry      Component Value Date/Time   NA 140 01/24/2019 0953   K 3.8 01/24/2019 0953   CL 100 01/24/2019 0953   CO2 34 (H) 01/24/2019 0953   BUN 13 01/24/2019 0953   CREATININE 0.62 01/24/2019 0953      Component Value Date/Time   CALCIUM 9.6 01/24/2019 0953   ALKPHOS 63 01/24/2019 0953   AST 16 01/24/2019 0953   ALT 20 01/24/2019 0953   BILITOT 0.4 01/24/2019 0953        Lab Results  Component Value Date   WBC 9.6 01/24/2019   HGB 14.2 01/24/2019   HCT 41.9 01/24/2019   MCV 89.5 01/24/2019   PLT 391.0 01/24/2019       EOS                                                               0.1                                    01/24/2019      Lab Results  Component Value Date   TSH 0.91 01/24/2019        ekg  01/24/2019  Nsr/ wnl               Assessment & Plan:

## 2019-01-24 NOTE — Telephone Encounter (Signed)
Called and spoke with Patient.  Patient stated she was calling back to let us know she received message with CXR results and recommendations. Nothing further at this time.

## 2019-01-24 NOTE — Patient Instructions (Signed)
Pneumovax and Tdap today  Please remember to go to the lab and x-ray department   for your tests - we will call you with the results when they are available.     Please schedule a follow up visit in 6 months but call sooner if needed

## 2019-01-30 ENCOUNTER — Encounter: Payer: Self-pay | Admitting: Internal Medicine

## 2019-01-30 NOTE — Assessment & Plan Note (Signed)
-   Ideal LDL < 130 with hbp and ? Pos fm hx   Lab Results  Component Value Date   CHOL 211 (H) 01/24/2019   HDL 64.50 01/24/2019   LDLCALC 130 (H) 01/24/2019   LDLDIRECT 140.4 05/22/2013   TRIG 81.0 01/24/2019   CHOLHDL 3 01/24/2019    High hdl is good sign, no change in rx needed

## 2019-01-30 NOTE — Assessment & Plan Note (Addendum)
-   Target wt 158 for bmi < 30    Body mass index is 31.83 kg/m.  -  trending down  Lab Results  Component Value Date   TSH 0.91 01/24/2019     Contributing to gerd risk/ doe/reviewed the need to maintain neg calorie balance

## 2019-01-30 NOTE — Assessment & Plan Note (Signed)
ekg ok   Lab Results  Component Value Date   CREATININE 0.62 01/24/2019   CREATININE 0.77 09/06/2018   CREATININE 0.72 01/14/2018     No changes needed

## 2019-01-30 NOTE — Assessment & Plan Note (Signed)
Referred to GI and seen 03/23/16 dx ibs/gerd - ERCP  09/16/17 c/w stone/ stent placed> referred to [facility] 09/27/17 extracted > pain resolved   No recurrence to date

## 2019-01-30 NOTE — Assessment & Plan Note (Signed)
-   on Bipap chronically> retitrated 05/16/2015  - HC03 up to 33 on bmet 12/20/13 > referred back to Dr [name] - HC03  12/24/2016  = 34   - HC03 01/14/2018 = 31  - HC03 01/24/2019   = 34   Adequate control on present rx, reviewed in detail with pt > no change in rx needed

## 2019-02-02 ENCOUNTER — Ambulatory Visit: Payer: Self-pay | Admitting: Internal Medicine

## 2019-02-14 ENCOUNTER — Telehealth: Payer: Self-pay | Admitting: Gastroenterology

## 2019-02-14 ENCOUNTER — Other Ambulatory Visit: Payer: Self-pay | Admitting: Gastroenterology

## 2019-02-14 MED ORDER — DICYCLOMINE HCL 10 MG PO CAPS: 10.0000 mg | ORAL_CAPSULE | Freq: Three times a day (TID) | ORAL | 11 refills | Status: DC | PRN

## 2019-02-14 NOTE — Telephone Encounter (Signed)
Pt reported that IBgard does not work for her and requested a refill on dicyclomine

## 2019-02-14 NOTE — Telephone Encounter (Signed)
Prescription for dicyclomine sent to patient's pharmacy.

## 2019-03-10 ENCOUNTER — Ambulatory Visit: Payer: Self-pay | Admitting: Internal Medicine

## 2019-03-20 ENCOUNTER — Telehealth: Payer: Self-pay | Admitting: Pulmonary Disease

## 2019-03-20 NOTE — Telephone Encounter (Signed)
I have received 2 requests from [facility]. They are asking for original sleep study and a signed order from Dr. [name]. We only have Cpap and Bipap titration studies in her chart. I have requested her paper chart from the warehouse to see if we have a copy of original sleep study. I will give the form to Dr. [name] to sign when he returns to the office

## 2019-03-23 ENCOUNTER — Telehealth: Payer: Self-pay | Admitting: Pulmonary Disease

## 2019-03-23 NOTE — Telephone Encounter (Addendum)
Spoke with rep at [facility]  She states she received a signed order from Dr [name] yesterday for an oral appliance  However, they need the narrative line to state that pt could not tolerate CPAP Will await fax  Fax received, updated and faxed back

## 2019-03-27 ENCOUNTER — Ambulatory Visit: Payer: Commercial Managed Care - PPO | Admitting: Gastroenterology

## 2019-04-03 ENCOUNTER — Telehealth: Payer: Self-pay | Admitting: Pulmonary Disease

## 2019-04-03 NOTE — Telephone Encounter (Signed)
Called [facility] and spoke with Amy. Asked Amy if they were needing just one OV that was prior to the sleep study or if they were needing more than one OV. Per Amy, they are just needing the OV which was right before the sleep study. I have faxed the OV notes to the provided fax. Nothing further needed.

## 2019-04-04 ENCOUNTER — Ambulatory Visit: Payer: Commercial Managed Care - PPO | Admitting: Physician Assistant

## 2019-04-21 ENCOUNTER — Ambulatory Visit: Payer: Commercial Managed Care - PPO | Admitting: Gastroenterology

## 2019-06-05 ENCOUNTER — Telehealth (HOSPITAL_COMMUNITY): Payer: Self-pay | Admitting: *Deleted

## 2019-06-05 ENCOUNTER — Ambulatory Visit: Payer: Commercial Managed Care - PPO | Admitting: Adult Health

## 2019-06-05 ENCOUNTER — Other Ambulatory Visit: Payer: Self-pay

## 2019-06-05 ENCOUNTER — Encounter: Payer: Self-pay | Admitting: Adult Health

## 2019-06-05 ENCOUNTER — Other Ambulatory Visit: Payer: Self-pay | Admitting: Adult Health

## 2019-06-05 VITALS — BP 130/72 | HR 76 | Temp 97.0°F | Ht 62.0 in | Wt 183.6 lb

## 2019-06-05 DIAGNOSIS — G4733 Obstructive sleep apnea (adult) (pediatric): Secondary | ICD-10-CM | POA: Diagnosis not present

## 2019-06-05 DIAGNOSIS — L03115 Cellulitis of right lower limb: Secondary | ICD-10-CM | POA: Diagnosis not present

## 2019-06-05 DIAGNOSIS — L0390 Cellulitis, unspecified: Secondary | ICD-10-CM | POA: Insufficient documentation

## 2019-06-05 DIAGNOSIS — R0602 Shortness of breath: Secondary | ICD-10-CM

## 2019-06-05 DIAGNOSIS — M7989 Other specified soft tissue disorders: Secondary | ICD-10-CM

## 2019-06-05 DIAGNOSIS — R7989 Other specified abnormal findings of blood chemistry: Secondary | ICD-10-CM

## 2019-06-05 LAB — BASIC METABOLIC PANEL
BUN: 13 mg/dL (ref 6–23)
CO2: 29 mEq/L (ref 19–32)
Calcium: 9.5 mg/dL (ref 8.4–10.5)
Chloride: 105 mEq/L (ref 96–112)
Creatinine, Ser: 0.8 mg/dL (ref 0.40–1.20)
GFR: 70.63 mL/min (ref >60.00)
Glucose, Bld: 102 mg/dL — ABNORMAL HIGH (ref 70–99)
Potassium: 3.9 mEq/L (ref 3.5–5.1)
Sodium: 142 mEq/L (ref 135–145)

## 2019-06-05 LAB — CBC WITH DIFFERENTIAL/PLATELET
Basophils Absolute: 0.1 10*3/uL (ref 0.0–0.1)
Basophils Relative: 1 % (ref 0.0–3.0)
Eosinophils Absolute: 0.2 10*3/uL (ref 0.0–0.7)
Eosinophils Relative: 2.8 % (ref 0.0–5.0)
HCT: 38.5 % (ref 36.0–46.0)
Hemoglobin: 12.7 g/dL (ref 12.0–15.0)
Lymphocytes Relative: 32.5 % (ref 12.0–46.0)
Lymphs Abs: 2.6 10*3/uL (ref 0.7–4.0)
MCHC: 32.9 g/dL (ref 30.0–36.0)
MCV: 93.8 fl (ref 78.0–100.0)
Monocytes Absolute: 0.6 10*3/uL (ref 0.1–1.0)
Monocytes Relative: 7.5 % (ref 3.0–12.0)
Neutro Abs: 4.5 10*3/uL (ref 1.4–7.7)
Neutrophils Relative %: 56.2 % (ref 43.0–77.0)
Platelets: 372 10*3/uL (ref 150.0–400.0)
RBC: 4.11 Mil/uL (ref 3.87–5.11)
RDW: 13.2 % (ref 11.5–15.5)
WBC: 8 10*3/uL (ref 4.0–10.5)

## 2019-06-05 LAB — BRAIN NATRIURETIC PEPTIDE: Pro B Natriuretic peptide (BNP): 23 pg/mL (ref 0.0–100.0)

## 2019-06-05 LAB — D-DIMER, QUANTITATIVE: D-Dimer, Quant: 0.91 mcg/mL FEU — ABNORMAL HIGH (ref <0.50)

## 2019-06-05 MED ORDER — DOXYCYCLINE HYCLATE 100 MG PO TABS: 100.0000 mg | ORAL_TABLET | Freq: Two times a day (BID) | ORAL | 0 refills | Status: DC

## 2019-06-05 NOTE — Progress Notes (Signed)
@Patient  ID: [name], female    DOB: 10/30/1945, 73 y.o.   MRN: [id number]  Chief Complaint  Patient presents with  . Acute Visit    leg swelling     Referring provider: Tanda Rockers, MD  HPI: 73 year old female never smoker followed for morbid obesity, gastroesophageal reflux disorder, obstructive sleep apnea and chronic rhinitis, hypertension.   TEST/EVENTS :   06/05/2019 Follow up : GERD, OSA , CR , HTN  Patient returns for an acute office visit.  Patient complains over the last 2 weeks that she has had increased lower extremity swelling.  Has noticed some redness along the right lower leg.  She has no known injury.  She denies any fever or skin drainage or lesions.  Says she has had some leg swelling in the past but usually is very temporary.  She has tried to keep her legs elevated.  Try to eat a low-salt diet.  She does have Maxide but says that she does not take it because she has urinary urgency and this makes it worse.  She is also noticed along her forearms that she bruises very easily.  She denies any aspirin or nonsteroidal use. She denies any hemoptysis, chest pain, orthopnea, dyspnea or cough.  Says she feels well other than this.  Says she did have a fall September 1 with a left humerus fracture.  She denies any known leg injury.  Retiring in Dec (worked for [employer] >40 yr)   She does have sleep apnea is on nocturnal BiPAP.  Says she is doing well with no issues.  Wears it every night.    Allergies  Allergen Reactions  . Epinephrine     REACTION: tachycardia    Immunization History  Administered Date(s) Administered  . Influenza Split 05/24/2013, 05/10/2015, 05/23/2016  . Influenza Whole 06/24/2009, 05/24/2010, 05/25/2011, 05/24/2012, 05/17/2017, 04/24/2018  . Influenza, High Dose Seasonal PF 06/05/2019  . Influenza,inj,Quad PF,6+ Mos 06/07/2014  . Influenza-Unspecified 06/09/2017  . Pneumococcal Conjugate-13 04/27/2014  . Pneumococcal  Polysaccharide-23 05/24/2008, 05/17/2012, 01/24/2019  . Td 05/23/2009  . Tdap 01/24/2019    Past Medical History:  Diagnosis Date  . Allergic rhinitis   . Allergy   . Anxiety   . Anxiety and depression   . Arthritis   . Asthma    years ago-1993-94  . Barrett's esophagus 05/2005  . Blood transfusion without reported diagnosis    as baby  . Cancer ([place])    Basal cell face  . Cataract    bilateral removal  . Depression   . Diverticulosis   . Esophageal stricture   . Gastric polyps   . GERD (gastroesophageal reflux disease)   . Hiatal hernia   . Hypertension    on meds  . Morbid obesity ([place])   . OSA (obstructive sleep apnea)   . Osteoporosis   . Pneumonia    many years ago  . Positive PPD   . Sleep apnea    bi pap    Tobacco History: Social History   Tobacco Use  Smoking Status Never Smoker  Smokeless Tobacco Never Used  Tobacco Comment   pt has never smoked but husband smoked   Counseling given: Not Answered Comment: pt has never smoked but husband smoked   Outpatient Medications Prior to Visit  Medication Sig Dispense Refill  . acetaminophen (TYLENOL) 325 MG tablet Take 650 mg by mouth every 6 (six) hours as needed. For pain    . ALPRAZolam (XANAX) 0.25 MG  tablet Take 0.125 mg by mouth 3 (three) times daily as needed. For anxiety    . buPROPion (WELLBUTRIN XL) 300 MG 24 hr tablet Take 300 mg by mouth daily.    . Calcium Carbonate-Vitamin D 600-400 MG-UNIT per tablet Take 1 tablet by mouth daily.      [name] dicyclomine (BENTYL) 10 MG capsule Take 1 capsule (10 mg total) by mouth 3 (three) times daily as needed for spasms. 90 capsule 11  . DULoxetine (CYMBALTA) 60 MG capsule Take 60 mg by mouth daily.    . famotidine (PEPCID) 20 MG tablet Take 20 mg by mouth at bedtime as needed. For allergies    . fluticasone (FLONASE) 50 MCG/ACT nasal spray PLACE 2 SPRAYS INTO BOTH NOSTRILS DAILY (Patient taking differently: Place 2 sprays into both nostrils at bedtime. ) 16 g  11  . mirabegron ER (MYRBETRIQ) 25 MG TB24 tablet Take 1 tablet by mouth daily.    . Multiple Vitamin (MULTIVITAMIN WITH MINERALS) TABS tablet Take 1 tablet by mouth daily.    . pantoprazole (PROTONIX) 40 MG tablet TAKE 1 TABLET TWICE DAILY. (30 MINUTES BEFORE 1ST AND LAST MEAL OF THE DAY) 60 tablet 11  . tolterodine (DETROL LA) 4 MG 24 hr capsule Take 4 mg by mouth at bedtime.    . triamterene-hydrochlorothiazide (MAXZIDE-25) 37.5-25 MG tablet 1 daily as needed for swelling 30 tablet 11  . fluconazole (DIFLUCAN) 200 MG tablet Take 200 mg by mouth 2 (two) times a week.    . naproxen (NAPROSYN) 500 MG tablet Take 500 mg by mouth daily as needed.    [name] VIIBRYD 40 MG TABS Take 20 mg by mouth daily.   3   No facility-administered medications prior to visit.      Review of Systems:   Constitutional:   No  weight loss, night sweats,  Fevers, chills, fatigue, or  lassitude.  HEENT:   No headaches,  Difficulty swallowing,  Tooth/dental problems, or  Sore throat,                No sneezing, itching, ear ache, nasal congestion, post nasal drip,   CV:  No chest pain,  Orthopnea, PND, +swelling in lower extremities,  No anasarca, dizziness, palpitations, syncope.   GI  No heartburn, indigestion, abdominal pain, nausea, vomiting, diarrhea, change in bowel habits, loss of appetite, bloody stools.   Resp: No shortness of breath with exertion or at rest.  No excess mucus, no productive cough,  No non-productive cough,  No coughing up of blood.  No change in color of mucus.  No wheezing.  No chest wall deformity  Skin: no rash or lesions.  GU: no dysuria, change in color of urine, no urgency or frequency.  No flank pain, no hematuria   MS:  No joint pain or swelling.  No decreased range of motion.  No back pain.    Physical Exam  BP 130/72 (BP Location: Right Arm, Cuff Size: Normal)   Pulse 76   Temp (!) 97 F (36.1 C) (Temporal)   Ht 5' 2"  (1.575 m)   Wt 183 lb 9.6 oz (83.3 kg)   SpO2 96%    BMI 33.58 kg/m   GEN: A/Ox3; pleasant , NAD, obese per BMI   HEENT:  [place]/AT,   NOSE-clear, THROAT-clear, no lesions, no postnasal drip or exudate noted.   NECK:  Supple w/ fair ROM; no JVD; normal carotid impulses w/o bruits; no thyromegaly or nodules palpated; no lymphadenopathy.  RESP  Clear  P & A; w/o, wheezes/ rales/ or rhonchi. no accessory muscle use, no dullness to percussion  CARD:  RRR, no m/r/g, 1-2 + peripheral edema, pulses intact, no cyanosis or clubbing. Neg calf pain , neg homans sign   GI:   Soft & nt; nml bowel sounds; no organomegaly or masses detected.   Musco: Warm bil, no deformities or joint swelling noted.   Neuro: alert, no focal deficits noted.    Skin: Warm, no lesions or rashes, few scattered ecchymotic areas along forearms.  No petechiae or purpura noted Along right lower extremity with mild erythema, warm to touch no drainage or lesions noted   Lab Results:  CBC    Component Value Date/Time   WBC 9.6 01/24/2019 0953   RBC 4.69 01/24/2019 0953   HGB 14.2 01/24/2019 0953   HCT 41.9 01/24/2019 0953   PLT 391.0 01/24/2019 0953   MCV 89.5 01/24/2019 0953   MCH 29.8 10/28/2011 1335   MCHC 33.8 01/24/2019 0953   RDW 16.0 (H) 01/24/2019 0953   LYMPHSABS 2.7 01/24/2019 0953   MONOABS 0.6 01/24/2019 0953   EOSABS 0.1 01/24/2019 0953   BASOSABS 0.1 01/24/2019 0953    BMET    Component Value Date/Time   NA 140 01/24/2019 0953   K 3.8 01/24/2019 0953   CL 100 01/24/2019 0953   CO2 34 (H) 01/24/2019 0953   GLUCOSE 87 01/24/2019 0953   BUN 13 01/24/2019 0953   CREATININE 0.62 01/24/2019 0953   CALCIUM 9.6 01/24/2019 0953   GFRNONAA 93.07 06/03/2010 0906   GFRAA  11/03/2008 2125    >60        The eGFR has been calculated using the MDRD equation. This calculation has not been validated in all clinical situations. eGFR's persistently <60 mL/min signify possible Chronic Kidney Disease.    BNP No results found for: BNP  ProBNP     Component Value Date/Time   PROBNP 17.0 01/11/2012 1606    Imaging: No results found.    No flowsheet data found.  No results found for: NITRICOXIDE      Assessment & Plan:   Leg swelling Lower extremity swelling.  Patient has had this in the past with previous negative venous Dopplers in 2017. Suspect is secondary to venous insufficiency.  Will check labs with a BNP be met and a D-dimer.  If D-dimer is positive.  Will check venous Dopplers. May have a mild early cellulitis in the right lower extremity.  With redness noted. We will treat with a 1 week course of antibiotics.  Plan  Patient Instructions  Labs today .  Legs elevated, low salt diet .  Please take Maxzide daily for 3 days , then As needed  Leg swelling  Doxycycline 116m Twice daily  For 1 week.  Warm compresses to lower right leg daily .  Keep legs clean and dry .  Follow up in 1 week with Dr. WMelvyn Novas And As needed   Please contact office for sooner follow up if symptoms do not improve or worsen or seek emergency care       Cellulitis Right lower extremity leg redness and swelling may be developing an early cellulitis.  Keep leg elevated warm compresses.  Use doxycycline x1 week  Plan  Patient Instructions  Labs today .  Legs elevated, low salt diet .  Please take Maxzide daily for 3 days , then As needed  Leg swelling  Doxycycline 1029mTwice daily  For  1 week.  Warm compresses to lower right leg daily .  Keep legs clean and dry .  Follow up in 1 week with Dr. Melvyn Novas  And As needed   Please contact office for sooner follow up if symptoms do not improve or worsen or seek emergency care       Obstructive sleep apnea Continue on nocturnal BiPAP     Rexene Edison, NP 06/05/2019

## 2019-06-05 NOTE — Progress Notes (Signed)
Chart and office note reviewed in detail  > agree with a/p as outlined    

## 2019-06-05 NOTE — Assessment & Plan Note (Signed)
Right lower extremity leg redness and swelling may be developing an early cellulitis.  Keep leg elevated warm compresses.  Use doxycycline x1 week  Plan  Patient Instructions  Labs today .  Legs elevated, low salt diet .  Please take Maxzide daily for 3 days , then As needed  Leg swelling  Doxycycline 100mg  Twice daily  For 1 week.  Warm compresses to lower right leg daily .  Keep legs clean and dry .  Follow up in 1 week with Dr. Melvyn Novas  And As needed   Please contact office for sooner follow up if symptoms do not improve or worsen or seek emergency care

## 2019-06-05 NOTE — Telephone Encounter (Signed)
The above patient or their representative was contacted and gave the following answers to these questions:         Do you have any of the following symptoms?    NO  Fever                    Cough                   Shortness of breath  Do  you have any of the following other symptoms?    muscle pain         vomiting,        diarrhea        rash         weakness        red eye        abdominal pain         bruising          bruising or bleeding              joint pain           severe headache    Have you been in contact with someone who was or has been sick in the past 2 weeks?  NO  Yes                 Unsure                         Unable to assess   Does the person that you were in contact with have any of the following symptoms?   Cough         shortness of breath           muscle pain         vomiting,            diarrhea            rash            weakness           fever            red eye           abdominal pain           bruising  or  bleeding                joint pain                severe headache                 COMMENTS OR ACTION PLAN FOR THIS PATIENT:  no

## 2019-06-05 NOTE — Assessment & Plan Note (Signed)
Lower extremity swelling.  Patient has had this in the past with previous negative venous Dopplers in 2017. Suspect is secondary to venous insufficiency.  Will check labs with a BNP be met and a D-dimer.  If D-dimer is positive.  Will check venous Dopplers. May have a mild early cellulitis in the right lower extremity.  With redness noted. We will treat with a 1 week course of antibiotics.  Plan  Patient Instructions  Labs today .  Legs elevated, low salt diet .  Please take Maxzide daily for 3 days , then As needed  Leg swelling  Doxycycline 166m Twice daily  For 1 week.  Warm compresses to lower right leg daily .  Keep legs clean and dry .  Follow up in 1 week with Dr. WMelvyn Novas And As needed   Please contact office for sooner follow up if symptoms do not improve or worsen or seek emergency care

## 2019-06-05 NOTE — Assessment & Plan Note (Signed)
Continue on nocturnal BiPAP 

## 2019-06-05 NOTE — Patient Instructions (Addendum)
Labs today .  Legs elevated, low salt diet .  Please take Maxzide daily for 3 days , then As needed  Leg swelling  Doxycycline 100mg  Twice daily  For 1 week.  Warm compresses to lower right leg daily .  Keep legs clean and dry .  Follow up in 1 week with Dr. Melvyn Novas  And As needed   Please contact office for sooner follow up if symptoms do not improve or worsen or seek emergency care

## 2019-06-06 ENCOUNTER — Ambulatory Visit (HOSPITAL_COMMUNITY)
Admission: RE | Admit: 2019-06-06 | Discharge: 2019-06-06 | Disposition: A | Discharge status: Home / Self Care | Payer: Commercial Managed Care - PPO | Source: Ambulatory Visit | Attending: Vascular Surgery | Admitting: Vascular Surgery

## 2019-06-06 ENCOUNTER — Other Ambulatory Visit: Payer: Self-pay

## 2019-06-06 DIAGNOSIS — M7989 Other specified soft tissue disorders: Secondary | ICD-10-CM | POA: Diagnosis present

## 2019-06-06 DIAGNOSIS — R7989 Other specified abnormal findings of blood chemistry: Secondary | ICD-10-CM | POA: Diagnosis present

## 2019-06-07 NOTE — Progress Notes (Signed)
Pt aware:  Call report venous dopplers are neg .  Rest of labs are ok  Cont w/ ov recs  Please contact office for sooner follow up if symptoms do not improve or worsen or seek emergency care    Nothing further needed.

## 2019-06-07 NOTE — Progress Notes (Signed)
Pt aware neg for DVT Nothing further needed.

## 2019-06-09 ENCOUNTER — Ambulatory Visit: Payer: Commercial Managed Care - PPO | Admitting: Internal Medicine

## 2019-06-12 ENCOUNTER — Ambulatory Visit (INDEPENDENT_AMBULATORY_CARE_PROVIDER_SITE_OTHER): Payer: Commercial Managed Care - PPO | Admitting: Internal Medicine

## 2019-06-12 ENCOUNTER — Encounter: Payer: Self-pay | Admitting: Internal Medicine

## 2019-06-12 ENCOUNTER — Other Ambulatory Visit: Payer: Self-pay

## 2019-06-12 DIAGNOSIS — L0390 Cellulitis, unspecified: Secondary | ICD-10-CM | POA: Diagnosis not present

## 2019-06-12 NOTE — Progress Notes (Signed)
Subjective:   Patient ID: [name], female    DOB: 1945/12/05     MRN: [id number]   Brief patient profile:  24 yowf never smoker with morbid obesity complicated by gastroesophageal reflux disease, and also a history of obstructive sleep apnea and chronic rhinitis/ hbp.   History of Present Illness  04/27/2014 f/u ov/[name] re: obesity/ ohs/hbp/djd  Chief Complaint  Patient presents with  . Annual Exam    Pt fasting. Overall doing well and denies any co's today.  no reg exercise, very tired in pms  > working out issues of sleep with [name] but he has not reviewed download yet rec Please remember to go to the lab and x-ray department downstairs for your tests - we will call you with the results when they are available. Prevnar today  Dr [name] will be in touch re your bipap settings    05/20/2018  f/u ov/[name] re:  [name] using ppi bid ac but not pepcid / new hoarseness x 97m/ plus hbp/ MO Chief Complaint  Patient presents with  . Acute Visit    Hoarseness off and on for the past 4 months.   Dyspnea:  Not limited by breathing from desired activities  / no aerobic at all now  Cough:clearing throat more s pattern but  not noct/ notes more at hosp with voice use  Sleeping: cpap flat one pillow rec Add pepcid 20 mg at bedtime GERD diet  Please see patient coordinator before you leave today  to schedule ENT eval for hoarseness>  Saw [name] 06/07/18  dx chronic nasopharyngitis and dysphonia> improved with abx/ ns irrigation   08/02/2018  f/u ov/[name] re: hbp /gerd/ ha generalized  In pm's better p tylenol  Chief Complaint  Patient presents with  . Follow-up    Doing well overall. She has HA on days that it rains.   Dyspnea:  Not limited by breathing from desired activities  / busy helping take [name] of son with disabilities with new tension pattern HA  Cough: none now  Sleeping: bipap/ flat on one pillow  rec No change in medications - remember elastic hose    01/24/2019  f/u ov/[name]  re:  Cpx:  Hbp, edema, MO  Chief Complaint  Patient presents with  . Anxiety  Dyspnea:  Yardwork/no aerobics Cough: none  Sleeping: cpap per [name]  SABA use: none 02: none  Viibryd per [name] helping anxiety rec No change rx  06/05/19 NP eval for leg swelling ? Cellulitis  rec Venous dopplers > neg venous dopplers bilaterally 06/06/19  Legs elevated, low salt diet .  Please take Maxzide daily for 3 days , then As needed  Leg swelling  Doxycycline 100mg  Twice daily  For 1 week.  Warm compresses to lower right leg daily .  Keep legs clean and dry .    06/12/2019  f/u ov/[name] re: obesity/ f/u cellulitis  Chief Complaint  Patient presents with  . Follow-up    Swelling has gone down in her right leg but still having some redness.   Dyspnea:  Not limited by breathing from desired activities   Cough: none  Sleeping: cpap per [name] SABA use: none 02: none     No obvious day to day or daytime variability or assoc excess/ purulent sputum or mucus plugs or hemoptysis or cp or chest tightness, subjective wheeze or overt sinus or hb symptoms.   Sleeping as above  without nocturnal  or early am exacerbation  of respiratory  c/o's or need for noct saba. Also denies any obvious fluctuation of symptoms with weather or environmental changes or other aggravating or alleviating factors except as outlined above   No unusual exposure hx or h/o childhood pna/ asthma or knowledge of premature birth.  Current Allergies, Complete Past Medical History, Past Surgical History, Family History, and Social History were reviewed in [employer] record.  ROS  The following are not active complaints unless bolded Hoarseness, sore throat, dysphagia, dental problems, itching, sneezing,  nasal congestion or discharge of excess mucus or purulent secretions, ear ache,   fever, chills, sweats, unintended wt loss or wt gain, classically pleuritic or exertional cp,  orthopnea pnd or arm/hand  swelling  or leg swelling improved, presyncope, palpitations, abdominal pain, anorexia, nausea, vomiting, diarrhea  or change in bowel habits or change in bladder habits, change in stools or change in urine, dysuria, hematuria,  Rash almost gone, arthralgias, visual complaints, headache, numbness, weakness or ataxia or problems with walking or coordination,  change in mood or  memory.        Current Meds  Medication Sig  . acetaminophen (TYLENOL) 325 MG tablet Take 650 mg by mouth every 6 (six) hours as needed. For pain  . ALPRAZolam (XANAX) 0.25 MG tablet Take 0.125 mg by mouth 3 (three) times daily as needed. For anxiety  . buPROPion (WELLBUTRIN XL) 300 MG 24 hr tablet Take 300 mg by mouth daily.  . Calcium Carbonate-Vitamin D 600-400 MG-UNIT per tablet Take 1 tablet by mouth daily.    [name] dicyclomine (BENTYL) 10 MG capsule Take 1 capsule (10 mg total) by mouth 3 (three) times daily as needed for spasms.  [name] doxycycline (VIBRA-TABS) 100 MG tablet Take 1 tablet (100 mg total) by mouth 2 (two) times daily.  . DULoxetine (CYMBALTA) 60 MG capsule Take 60 mg by mouth daily.  . famotidine (PEPCID) 20 MG tablet Take 20 mg by mouth at bedtime as needed. For allergies  . fluticasone (FLONASE) 50 MCG/ACT nasal spray PLACE 2 SPRAYS INTO BOTH NOSTRILS DAILY (Patient taking differently: Place 2 sprays into both nostrils at bedtime. )  . mirabegron ER (MYRBETRIQ) 25 MG TB24 tablet Take 1 tablet by mouth daily.  . Multiple Vitamin (MULTIVITAMIN WITH MINERALS) TABS tablet Take 1 tablet by mouth daily.  . pantoprazole (PROTONIX) 40 MG tablet TAKE 1 TABLET TWICE DAILY. ([place] LAST MEAL OF THE DAY)  . tolterodine (DETROL LA) 4 MG 24 hr capsule Take 4 mg by mouth at bedtime.  . triamterene-hydrochlorothiazide (MAXZIDE-25) 37.5-25 MG tablet 1 daily as needed for swelling                      Past Medical History:  HIATAL HERNIA (ICD-553.3) with GERD  - H/o Barretts esophagus  - EGD pos  HH/ stricture 04/18/10 , neg barrett's  RHINITIS, CHRONIC (ICD-472.0) .................................................................... [name]  - Remote allergy testing = molds/dust only  - Sinus CT 09/06/08 mild thickening only  OBSTRUCTIVE SLEEP APNEA (ICD-327.23)..................................................[name]  --PSG 03/31/02 AHI 64  --Nocturnal BIPAP > titration done 05/16/2015  MORBID OBESITY (ICD-278.01)  - Target wt = 158 for BMI < 30  POSITIVE PPD (ICD-795.5)  DIVERTICULOSIS  Shingles R T5/6 03/2015  Depression / Anxiety ..............................................................[name] Karr Stasis dermatitis ..................................................................... 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  Never smoker, but Husband smoked / passed 0000000 Illicit Drug Use - no  Patient does not get regular exercise.  No etoh          Objective:   Physical Exam  wt 224 May 23, 2009 > 226 September 10, 2009 > 229 November 05, 2009 > 232 June 03, 2010 > 224 05/25/2011 >230 01/11/12 > 03/04/2012  225 > 05/17/2012  219 >217 07/29/2012 > 219 09/06/2012 >223 03/16/2013 > 04/11/2013  225 > 05/22/2013  225 > 225 06/23/2013 >  02/27/2014 215 > 04/27/2014  215 >  04/24/2015  222>  12/24/2015   214 >  03/02/2016  214 > 07/20/2016  220 > 08/20/2016  219 >  12/24/2016   211 > 02/12/2017  210 > 06/28/2017   207  > 01/14/2018   187  > 05/20/2018  194  > 08/02/2018  196  >  01/24/2019   174  > 06/12/2019     amb obese wf nad with L shoulder sling   Vital signs reviewed - Note on arrival 02 sats  98% on RA    HEENT : pt wearing mask not removed for exam due to covid -19 concerns.    NECK :  without JVD/Nodes/TM/ nl carotid upstrokes bilaterally   LUNGS: no acc muscle use,  Nl contour chest which is clear to A and P bilaterally without cough on insp or exp maneuvers   CV:  RRR  no s3 or murmur or increase in P2, and no edema   ABD:  Obese soft and nontender with nl inspiratory excursion in the supine position. No bruits or organomegaly appreciated, bowel sounds nl  MS:  Nl gait/ ext warm without deformities, calf tenderness, cyanosis or clubbing    SKIN: warm and dry with miminal er  NEURO:  alert, approp, nl sensorium with  no motor or cerebellar deficits apparent.                             Assessment & Plan:

## 2019-06-12 NOTE — Patient Instructions (Addendum)
No change in medications    Please schedule a follow up visit in 6  months but call sooner if needed  

## 2019-06-13 ENCOUNTER — Encounter: Payer: Self-pay | Admitting: Internal Medicine

## 2019-06-13 NOTE — Assessment & Plan Note (Signed)
Venous dopplers > neg venous dopplers bilaterally 06/06/19    Resolved / need to work on wt loss / use of diuretics reviewed

## 2019-06-14 ENCOUNTER — Telehealth: Payer: Self-pay | Admitting: Internal Medicine

## 2019-06-14 NOTE — Telephone Encounter (Signed)
Rec'd from [facility] forwarded 3 pages to Dr. Christinia Gully

## 2019-07-17 ENCOUNTER — Encounter: Payer: Self-pay | Admitting: Internal Medicine

## 2019-07-17 ENCOUNTER — Other Ambulatory Visit: Payer: Self-pay

## 2019-07-17 ENCOUNTER — Ambulatory Visit (INDEPENDENT_AMBULATORY_CARE_PROVIDER_SITE_OTHER): Payer: Commercial Managed Care - PPO | Admitting: Internal Medicine

## 2019-07-17 DIAGNOSIS — B349 Viral infection, unspecified: Secondary | ICD-10-CM | POA: Insufficient documentation

## 2019-07-17 MED ORDER — HYDROCODONE-ACETAMINOPHEN 5-325 MG PO TABS: 1.0000 | ORAL_TABLET | ORAL | 0 refills | Status: DC | PRN

## 2019-07-17 NOTE — Progress Notes (Signed)
Subjective:   Patient ID: [name], female    DOB: [birth year]     MRN: [id number]   Brief patient profile:  38 yowf never smoker with morbid obesity complicated by gastroesophageal reflux disease, and also a history of obstructive sleep apnea and chronic rhinitis/ hbp.   History of Present Illness  04/27/2014 f/u ov/[name] re: obesity/ ohs/hbp/djd  Chief Complaint  Patient presents with  . Annual Exam    Pt fasting. Overall doing well and denies any co's today.  no reg exercise, very tired in pms  > working out issues of sleep with [name] but he has not reviewed download yet rec Please remember to go to the lab and x-ray department downstairs for your tests - we will call you with the results when they are available. Prevnar today  Dr [name] will be in touch re your bipap settings    05/20/2018  f/u ov/[name] re:  [name] using ppi bid ac but not pepcid / new hoarseness x 21m/ plus hbp/ MO Chief Complaint  Patient presents with  . Acute Visit    Hoarseness off and on for the past 4 months.   Dyspnea:  Not limited by breathing from desired activities  / no aerobic at all now  Cough:clearing throat more s pattern but  not noct/ notes more at hosp with voice use  Sleeping: cpap flat one pillow rec Add pepcid 20 mg at bedtime GERD diet  Please see patient coordinator before you leave today  to schedule ENT eval for hoarseness>  Saw [name] 06/07/18  dx chronic nasopharyngitis and dysphonia> improved with abx/ ns irrigation   08/02/2018  f/u ov/[name] re: hbp /gerd/ ha generalized  In pm's better p tylenol  Chief Complaint  Patient presents with  . Follow-up    Doing well overall. She has HA on days that it rains.   Dyspnea:  Not limited by breathing from desired activities  / busy helping take [name] of son with disabilities with new tension pattern HA  Cough: none now  Sleeping: bipap/ flat on one pillow  rec No change in medications - remember elastic hose    01/24/2019  f/u ov/[name]  re:  Cpx:  Hbp, edema, MO  Chief Complaint  Patient presents with  . Anxiety  Dyspnea:  Yardwork/no aerobics Cough: none  Sleeping: cpap per sood  SABA use: none 02: none  Viibryd per [name] helping anxiety rec No change rx  06/05/19 NP eval for leg swelling ? Cellulitis  rec Venous dopplers > neg venous dopplers bilaterally 06/06/19  Legs elevated, low salt diet .  Please take Maxzide daily for 3 days , then As needed  Leg swelling  Doxycycline 100mg  Twice daily  For 1 week.  Warm compresses to lower right leg daily .  Keep legs clean and dry .    06/12/2019  f/u ov/[name] re: obesity/ f/u cellulitis  Chief Complaint  Patient presents with  . Follow-up    Swelling has gone down in her right leg but still having some redness.   Dyspnea:  Not limited by breathing from desired activities   Cough: none  Sleeping: cpap per [name] rec No change rx     Virtual Visit via Telephone Note 07/17/2019   I connected with [name] on 07/17/19 at  4: 50  PM EST by telephone and verified that I am speaking with the correct person using two identifiers.   I discussed the limitations, risks, security and privacy concerns  of performing an evaluation and management service by telephone and the availability of in person appointments. I also discussed with the patient that there may be a patient responsible charge related to this service. The patient expressed understanding and agreed to proceed.   History of Present Illness: acute fever "like nothing I've had before"  / tested neg covid 07/17/2019 rapid test Last shopped 07/15/2019 [employer] and woke up am 07/17/2019 with ha, back pain, abd pain, fever 100.2, no sore throat, loss of smell, n v or d.   No cough or sob. No rigors or light headedness  [name] is generalized not like a sinus problem/ ? Mild photobia. Appetite not good, energy bad Dyspnea:  None, just tired  Cough: no Sleeping: ok SABA use: non 02: non   No obvious day to day  or daytime variability or assoc excess/ purulent sputum or mucus plugs or hemoptysis or cp or chest tightness, subjective wheeze or overt sinus or hb symptoms.    Also denies any obvious fluctuation of symptoms with weather or environmental changes or other aggravating or alleviating factors except as outlined above.   Meds reviewed/ med reconciliation completed       Observations/Objective: Sounds fine, no cough, talking in full sentences    Assessment and Plan: See problem list for active a/p's   Follow Up Instructions: See avs for instructions unique to this ov which includes revised/ updated med list     I discussed the assessment and treatment plan with the patient. The patient was provided an opportunity to ask questions and all were answered. The patient agreed with the plan and demonstrated an understanding of the instructions.   The patient was advised to call back or seek an in-person evaluation if the symptoms worsen or if the condition fails to improve as anticipated.  I provided 25 minutes of non-face-to-face time during this encounter.   [name] Gully, MD                       Past Medical History:  HIATAL HERNIA (ICD-553.3) with GERD  - H/o Barretts esophagus  - EGD pos HH/ stricture 04/18/10 , neg barrett's  RHINITIS, CHRONIC (ICD-472.0) .................................................................... [name]  - Remote allergy testing = molds/dust only  - Sinus CT 09/06/08 mild thickening only  OBSTRUCTIVE SLEEP APNEA (ICD-327.23)..................................................[name]  --PSG 03/31/02 AHI 64  --Nocturnal BIPAP > titration done 05/16/2015  MORBID OBESITY (ICD-278.01)  - Target wt = 158 for BMI < 30  POSITIVE PPD (ICD-795.5)  DIVERTICULOSIS  Shingles R T5/6 03/2015  Depression / Anxiety ..............................................................[name] Karr Stasis dermatitis  ..................................................................... [employer] MAINTENANCE........................................................Marland KitchenWert  - DT May 23, 2009,    01/24/2019  - Pneumovax 04/2013, Prevnar 04/27/2014, Pneumovax 01/24/2019  - CPX 01/24/2019  - GYN Levoix        Family History:  Father - CAD, ? 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  Never smoker, but Husband smoked / passed 0000000 Illicit Drug Use - no  Patient does not get regular exercise.  No etoh

## 2019-07-17 NOTE — Patient Instructions (Addendum)
Stay hydrated and monitor saturations and go to er if worsening   Consider testing again in 48 hours to confirm as likely the rapid test was a false negative  For pain that doesn't respond to advil try vicodin 5 mg every 4 hours (also works for cough)

## 2019-07-17 NOTE — Assessment & Plan Note (Addendum)
Onset of symptoms am  07/17/2019 with covid rapid test neg pm 07/17/2019   Advised re natural hx of covid 19 and rx for now is treat the symptoms with vicodin for cough or ha fluids. Call if worse sob as may qualify for outpt trial of dexamathasone, but monitor sats and to ER if deteriorating regardless of symptoms.    Each maintenance medication was reviewed in detail including most importantly the difference between maintenance and as needed and under what circumstances the prns are to be used.  Please see AVS for specific  Instructions which are unique to this visit and I personally typed out  which were reviewed in detail over the phone with the patient and a copy provided via Mychart

## 2019-07-18 ENCOUNTER — Telehealth: Payer: Self-pay | Admitting: Internal Medicine

## 2019-07-18 NOTE — Telephone Encounter (Signed)
Attempted to call pt but unable to reach. Left message for pt to return call. 

## 2019-07-18 NOTE — Telephone Encounter (Signed)
Just do the u/a not the cuture unless she has a h/o freq uti's which I don't recall to be the case and drink plenty of fluids in meantime

## 2019-07-18 NOTE — Telephone Encounter (Signed)
Message routed to Dr. Melvyn Novas as the patient had televisit with him 07/18/19.  Dr. Melvyn Novas, I called the patient she stated that yesterday was her first day back to work. Had a rapid covid test yesterday. But noticed over the weekend her urine was cloudy in appearance.   Patient denied fever and stated that she drinks mainly tea and does not drink that much water (maybe 8 oz a day). Patient denied burning with urination, no pelvic pain.  Based on the information from the patient and her televisit yesterday, can she come by to leave a urine sample to be cultured for possible UTI?

## 2019-07-19 NOTE — Telephone Encounter (Signed)
LMTCB

## 2019-07-24 ENCOUNTER — Telehealth: Payer: Self-pay | Admitting: Internal Medicine

## 2019-07-24 NOTE — Telephone Encounter (Signed)
Call returned to patient, made aware of MW recommendations. She request that we extend her notice through Friday. Tele-visit appt made for Thursday to f/u with patient regarding whether or not she can return to work. She would like the letter faxed to the info below:  Fax [phone] Atten:Jeff Onalee Hua   MW please advise if we can extend the notice through Friday. Thanks.

## 2019-07-24 NOTE — Telephone Encounter (Signed)
Should not return to work while symptomatic - still could have had a false positive otc immodium / kaopectate as strong as I would use for now as it has to run its course  No dairy products, fresh veggies or salads for now, just broth based soups with noodles or crackers  Ok to excuse from work for 3 more days if needed (the clock starts after the symptoms are gone)

## 2019-07-24 NOTE — Telephone Encounter (Signed)
Call made to patient, made aware letter has been faxed.   Voiced understanding. Nothing further needed at this time.

## 2019-07-24 NOTE — Telephone Encounter (Signed)
Fine with me

## 2019-07-24 NOTE — Telephone Encounter (Signed)
Letter made. Will print and have MW sign.

## 2019-07-24 NOTE — Telephone Encounter (Signed)
Call made to patient, confirmed DOB, she states the cloudiness has cleared up since she spoke with [place]. She states she will keep an eye on it for now. Voiced understanding.   Nothing further needed at this time.

## 2019-07-24 NOTE — Telephone Encounter (Signed)
Call made to patient, confirmed DOB, she states she  recently was tested for covid. Her results were negative. She has not been running a fever in 4 days but she is still having a lot of diarrhea. She tried the OTC immodium that has minimal results. She wants to go back to work and is requesting recommendations regarding the diarrhea and whether or not she needs to be tested again.   MW please advise. Thanks.

## 2019-07-26 ENCOUNTER — Telehealth: Payer: Self-pay | Admitting: Internal Medicine

## 2019-07-26 NOTE — Telephone Encounter (Signed)
No need for u/a just call in cipro 250 one bid x 3 days and then if fails to clear or recurs we can have her come in for eval

## 2019-07-26 NOTE — Telephone Encounter (Signed)
LMTCB

## 2019-07-26 NOTE — Telephone Encounter (Signed)
Spoke with pt  She is scheduled for televisit tomorrow to f/u from acute televisit on 07/17/19  She is asking if she can come in for labs today to check for UTI- having some cloudy urine and burning when she urinates for the past few days  She states she has not had any fever in the past 6 days and no longer coughing or having any problems breathing  Please advise thanks

## 2019-07-26 NOTE — Telephone Encounter (Signed)
LMTCB   Please let pt know one of our nurses is working remotely so please answer when I call.

## 2019-07-26 NOTE — Telephone Encounter (Signed)
Pt returning phone call ° °

## 2019-07-27 ENCOUNTER — Ambulatory Visit (INDEPENDENT_AMBULATORY_CARE_PROVIDER_SITE_OTHER): Payer: Commercial Managed Care - PPO | Admitting: Internal Medicine

## 2019-07-27 ENCOUNTER — Encounter: Payer: Self-pay | Admitting: Internal Medicine

## 2019-07-27 ENCOUNTER — Other Ambulatory Visit: Payer: Self-pay

## 2019-07-27 DIAGNOSIS — B349 Viral infection, unspecified: Secondary | ICD-10-CM | POA: Insufficient documentation

## 2019-07-27 NOTE — Progress Notes (Signed)
Subjective:   Patient ID: [name], female    DOB: 1945/10/01     MRN: [id number]   Brief patient profile:  89 yowf never smoker with morbid obesity complicated by gastroesophageal reflux disease, and also a history of obstructive sleep apnea and chronic rhinitis/ hbp.   History of Present Illness  04/27/2014 f/u ov/Kaiyla Stahly re: obesity/ ohs/hbp/djd  Chief Complaint  Patient presents with  . Annual Exam    Pt fasting. Overall doing well and denies any co's today.  no reg exercise, very tired in pms  > working out issues of sleep with [name] but he has not reviewed download yet rec Please remember to go to the lab and x-ray department downstairs for your tests - we will call you with the results when they are available. Prevnar today  Dr [name] will be in touch re your bipap settings    05/20/2018  f/u ov/Blakley Michna re:  [name] using ppi bid ac but not pepcid / new hoarseness x 42m/ plus hbp/ MO Chief Complaint  Patient presents with  . Acute Visit    Hoarseness off and on for the past 4 months.   Dyspnea:  Not limited by breathing from desired activities  / no aerobic at all now  Cough:clearing throat more s pattern but  not noct/ notes more at hosp with voice use  Sleeping: cpap flat one pillow rec Add pepcid 20 mg at bedtime GERD diet  Please see patient coordinator before you leave today  to schedule ENT eval for hoarseness>  Saw [name] 06/07/18  dx chronic nasopharyngitis and dysphonia> improved with abx/ ns irrigation   08/02/2018  f/u ov/Destanie Tibbetts re: hbp /gerd/ ha generalized  In pm's better p tylenol  Chief Complaint  Patient presents with  . Follow-up    Doing well overall. She has HA on days that it rains.   Dyspnea:  Not limited by breathing from desired activities  / busy helping take care of son with disabilities with new tension pattern HA  Cough: none now  Sleeping: bipap/ flat on one pillow  rec No change in medications - remember elastic hose    01/24/2019  f/u ov/Lennie Vasco  re:  Cpx:  Hbp, edema, MO  Chief Complaint  Patient presents with  . Anxiety  Dyspnea:  Yardwork/no aerobics Cough: none  Sleeping: cpap per sood  SABA use: none 02: none  Viibryd per [name] helping anxiety rec No change rx  06/05/19 NP eval for leg swelling ? Cellulitis  rec Venous dopplers > neg venous dopplers bilaterally 06/06/19  Legs elevated, low salt diet .  Please take Maxzide daily for 3 days , then As needed  Leg swelling  Doxycycline 100mg  Twice daily  For 1 week.  Warm compresses to lower right leg daily .  Keep legs clean and dry .    06/12/2019  f/u ov/Adra Shepler re: obesity/ f/u cellulitis  Chief Complaint  Patient presents with  . Follow-up    Swelling has gone down in her right leg but still having some redness.   Dyspnea:  Not limited by breathing from desired activities   Cough: none  Sleeping: cpap per [name] rec No change rx     Virtual Visit via Telephone Note 07/17/2019   I connected with [name] on 07/17/19 at  4: 50  PM EST by telephone and verified that I am speaking with the correct person using two identifiers.   I discussed the limitations, risks, security and privacy concerns  of performing an evaluation and management service by telephone and the availability of in person appointments. I also discussed with the patient that there may be a patient responsible charge related to this service. The patient expressed understanding and agreed to proceed.   History of Present Illness: acute fever "like nothing I've had before"  / tested neg covid 07/17/2019 rapid test Last shopped 07/15/2019 [employer] and woke up am 07/17/2019 with ha, back pain, abd pain, fever 100.2, no sore throat, loss of smell, n v or d.   No cough or sob. No rigors or light headedness  Lamonte Sakai is generalized not like a sinus problem/ ? Mild photobia. Appetite not good, energy bad Dyspnea:  None, just tired  rec Stay hydrated and monitor saturations and go to er if worsening   Consider testing again in 48 hours to confirm as likely the rapid test was a false negative For pain that doesn't respond to advil try vicodin 5 mg every 4 hours (also works for cough)   Virtual Visit via Telephone Note 07/27/2019   I connected with [name] on 07/27/19 at  9:30 AM EST by telephone and verified that I am speaking with the correct person using two identifiers.   I discussed the limitations, risks, security and privacy concerns of performing an evaluation and management service by telephone and the availability of in person appointments. I also discussed with the patient that there may be a patient responsible charge related to this service. The patient expressed understanding and agreed to proceed.   History of Present Illness: Dyspnea:  Not limited by breathing from desired activities / some fatigue  Cough: no Sleeping: fine on cpap SABA use: none  02: none    No obvious day to day or daytime variability or assoc excess/ purulent sputum or mucus plugs or hemoptysis or cp or chest tightness, subjective wheeze or overt sinus or hb symptoms.    Also denies any obvious fluctuation of symptoms with weather or environmental changes or other aggravating or alleviating factors except as outlined above.   Meds reviewed/ med reconciliation completed     Observations/Objective: Sounds good on phone   Assessment and Plan: See problem list for active a/p's   Follow Up Instructions: See avs for instructions unique to this ov which includes revised/ updated med list     I discussed the assessment and treatment plan with the patient. The patient was provided an opportunity to ask questions and all were answered. The patient agreed with the plan and demonstrated an understanding of the instructions.   The patient was advised to call back or seek an in-person evaluation if the symptoms worsen or if the condition fails to improve as anticipated.  I provided 15  minutes of non-face-to-face time during this encounter.   Christinia Gully, MD                             Past Medical History:  HIATAL HERNIA (ICD-553.3) with GERD  - H/o Barretts esophagus  - EGD pos HH/ stricture 04/18/10 , neg barrett's  RHINITIS, CHRONIC (ICD-472.0) .................................................................... [name]  - Remote allergy testing = molds/dust only  - Sinus CT 09/06/08 mild thickening only  OBSTRUCTIVE SLEEP APNEA (ICD-327.23)..................................................[name]  --PSG 03/31/02 AHI 64  --Nocturnal BIPAP > titration done 05/16/2015  MORBID OBESITY (ICD-278.01)  - Target wt = 158 for BMI < 30  POSITIVE PPD (ICD-795.5)  DIVERTICULOSIS  Shingles R T5/6 03/2015  Depression / Anxiety ..............................................................[name] Karr Stasis dermatitis ..................................................................... [facility] [employer] MA[employer]........................................................Marland KitchenWert  - DT May 23, 2009,    01/24/2019  - Pneumovax 04/2013, Prevnar 04/27/2014, Pneumovax 01/24/2019  - CPX 01/24/2019  - GYN Levoix        Family History:  Father - CAD, ? 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  Never smoker, but Husband smoked / passed 0000000 Illicit Drug Use - no  Patient does not get regular exercise.  No etoh

## 2019-07-27 NOTE — Patient Instructions (Addendum)
No change in medications  Please schedule a follow up visit in 6 months but call sooner if needed  - CPX on return

## 2019-07-27 NOTE — Telephone Encounter (Signed)
Pt has televisit with MW today and this can be discussed then so will close encounter

## 2019-07-27 NOTE — Assessment & Plan Note (Signed)
Onset 07/17/2019 with neg rapid covid test 07/17/2019 rx as false negative s repeat testing  Back to baseline now in every regard x for mild fatigue  and advised since we don't know this was covid to continue isolation guidelines and would plan to take vaccine when offered but not "in the front of the line" as strongly suspect she had covid19   Each maintenance medication was reviewed in detail including most importantly the difference between maintenance and as needed and under what circumstances the prns are to be used.  Please see AVS for specific  Instructions which are unique to this visit and I personally typed out  which were reviewed in detail over the phone with the patient and a copy provided via e chart.

## 2019-09-25 DIAGNOSIS — M25511 Pain in right shoulder: Secondary | ICD-10-CM | POA: Diagnosis not present

## 2019-10-09 DIAGNOSIS — M25511 Pain in right shoulder: Secondary | ICD-10-CM | POA: Diagnosis not present

## 2019-10-31 ENCOUNTER — Telehealth: Payer: Self-pay | Admitting: Internal Medicine

## 2019-10-31 DIAGNOSIS — S42201D Unspecified fracture of upper end of right humerus, subsequent encounter for fracture with routine healing: Secondary | ICD-10-CM | POA: Diagnosis not present

## 2019-10-31 DIAGNOSIS — M25511 Pain in right shoulder: Secondary | ICD-10-CM | POA: Diagnosis not present

## 2019-10-31 DIAGNOSIS — S42202D Unspecified fracture of upper end of left humerus, subsequent encounter for fracture with routine healing: Secondary | ICD-10-CM | POA: Diagnosis not present

## 2019-10-31 DIAGNOSIS — Z0000 Encounter for general adult medical examination without abnormal findings: Secondary | ICD-10-CM

## 2019-10-31 NOTE — Telephone Encounter (Signed)
LMOM TCB x1 to inform patient orders have been placed

## 2019-10-31 NOTE — Telephone Encounter (Signed)
Last did bone density 2017 so go ahead and repeat and ok to order mammograms as well

## 2019-10-31 NOTE — Telephone Encounter (Signed)
Called and spoke to pt. Pt is requesting a mammogram and a bone density test performed at [facility]. Pt states she fractured her humerus in Sept 2020 and her orthopedic provider suggested a bone density.   Dr. Melvyn Novas please advise if you are ok ordering these. Thanks.

## 2019-11-01 NOTE — Telephone Encounter (Signed)
Pt has been scheduled for mammogram and bone density.  Appt info given to pt and orders faxed to [facility].  Nothing further needed.

## 2019-11-01 NOTE — Telephone Encounter (Signed)
I have called [facility] to schedule Mammogram & Bone Density.  They are asking for ICD-10 dx codes.  Dx listed on orders is health care maintenance (Z00.00).  Scheduler told me this will not work for insurance.  Mammogram needs to have a screening mammogram code and she gave me a few codes that can be used for Bone Density - M85.89, M81.0, M81.8 or N95.9.  Please let me know which dx codes to give scheduling and I will call back to get scheduled.  I was also asked if Mammogram is to be 2D or 3D and I told her order states Digital Screening Bilateral so she said they will do 2D.

## 2019-11-01 NOTE — Telephone Encounter (Signed)
These are usually ordered by GYN so whatever codes they typically use are fine with me, just pick one

## 2019-11-01 NOTE — Telephone Encounter (Signed)
Called and spoke to pt. Informed her of the recs per MW. Pt verbalized understanding and denied any further questions or concerns at this time.    PCC's pt would like to have both her mammogram and bone density at [facility] if possible. Thanks

## 2019-11-01 NOTE — Telephone Encounter (Signed)
Dr. Melvyn Novas, Please advise on which diagnosis codes you would prefer to use for the mammogram and bone density that are listed.

## 2019-11-01 NOTE — Telephone Encounter (Signed)
Going to go over this with [facility].

## 2019-11-21 DIAGNOSIS — R233 Spontaneous ecchymoses: Secondary | ICD-10-CM | POA: Diagnosis not present

## 2019-11-21 DIAGNOSIS — L570 Actinic keratosis: Secondary | ICD-10-CM | POA: Diagnosis not present

## 2019-11-28 ENCOUNTER — Encounter (HOSPITAL_COMMUNITY): Payer: Medicare Other

## 2019-12-06 ENCOUNTER — Encounter: Payer: Self-pay | Admitting: Internal Medicine

## 2019-12-06 DIAGNOSIS — M8589 Other specified disorders of bone density and structure, multiple sites: Secondary | ICD-10-CM | POA: Diagnosis not present

## 2019-12-06 DIAGNOSIS — Z1231 Encounter for screening mammogram for malignant neoplasm of breast: Secondary | ICD-10-CM | POA: Diagnosis not present

## 2019-12-06 DIAGNOSIS — M810 Age-related osteoporosis without current pathological fracture: Secondary | ICD-10-CM | POA: Diagnosis not present

## 2019-12-11 ENCOUNTER — Ambulatory Visit: Payer: Commercial Managed Care - PPO | Admitting: Internal Medicine

## 2019-12-13 ENCOUNTER — Encounter: Payer: Self-pay | Admitting: Adult Health

## 2019-12-13 ENCOUNTER — Other Ambulatory Visit: Payer: Self-pay

## 2019-12-13 ENCOUNTER — Ambulatory Visit: Payer: PPO | Admitting: Adult Health

## 2019-12-13 DIAGNOSIS — I10 Essential (primary) hypertension: Secondary | ICD-10-CM | POA: Diagnosis not present

## 2019-12-13 DIAGNOSIS — R233 Spontaneous ecchymoses: Secondary | ICD-10-CM

## 2019-12-13 DIAGNOSIS — R238 Other skin changes: Secondary | ICD-10-CM | POA: Diagnosis not present

## 2019-12-13 DIAGNOSIS — M7989 Other specified soft tissue disorders: Secondary | ICD-10-CM

## 2019-12-13 LAB — COMPREHENSIVE METABOLIC PANEL
ALT: 12 U/L (ref 0–35)
AST: 15 U/L (ref 0–37)
Albumin: 4.1 g/dL (ref 3.5–5.2)
Alkaline Phosphatase: 61 U/L (ref 39–117)
BUN: 10 mg/dL (ref 6–23)
CO2: 32 mEq/L (ref 19–32)
Calcium: 9.7 mg/dL (ref 8.4–10.5)
Chloride: 103 mEq/L (ref 96–112)
Creatinine, Ser: 0.73 mg/dL (ref 0.40–1.20)
GFR: 78.39 mL/min (ref >60.00)
Glucose, Bld: 99 mg/dL (ref 70–99)
Potassium: 3.7 mEq/L (ref 3.5–5.1)
Sodium: 142 mEq/L (ref 135–145)
Total Bilirubin: 0.4 mg/dL (ref 0.2–1.2)
Total Protein: 7 g/dL (ref 6.0–8.3)

## 2019-12-13 MED ORDER — TRIAMTERENE-HCTZ 37.5-25 MG PO TABS: ORAL_TABLET | ORAL | 5 refills | Status: DC

## 2019-12-13 NOTE — Assessment & Plan Note (Signed)
Scattered ecchymosis along the arms and legs suspect this is just to thin her skin.  She is not on any blood thinners aspirin Plavix or chronic steroids. We will check CBC and LFTs Continue to monitor

## 2019-12-13 NOTE — Progress Notes (Signed)
 @Patient ID: [name], female    DOB: 06/19/1946, 74 y.o.   MRN: [id number]  Chief Complaint  Patient presents with  . Follow-up    Leg swelling    Referring provider: [name], Michael B, MD  HPI: 74-year-old female never smoker followed for primary care by Dr. [name].  Medical history significant for morbid obesity, GERD, OSA, chronic rhinitis and hypertension  TEST/EVENTS :  Venous Dopplers October 2020 - for DVT bilaterally Chest x-ray June 2020 is clear  12/13/2019 Acute OV : Leg swelling  Patient returns today for ongoing lower extremity swelling.  Patient was seen last few visits for increased leg swelling.  She was treated in October 2020 for possible mild cellulitis.  Venous Dopplers were negative for DVTs bilaterally.   Patient says she continues to have ongoing leg swelling both lower extremities.  She is also noticed over the last several weeks that she has had bruising along her arms and legs.  She denies any calf pain, fever, increased redness.  Previous lab work showed a normal BNP. She has not used her Maxide.  She says she does have overactive bladder and tries not to use it if she does not have to. Patient has retired and is loving retirement.  Patient has underlying sleep apnea is on on nocturnal BiPAP.    Allergies  Allergen Reactions  . Epinephrine     REACTION: tachycardia    Immunization History  Administered Date(s) Administered  . Influenza Split 05/24/2013, 05/10/2015, 05/23/2016  . Influenza Whole 06/24/2009, 05/24/2010, 05/25/2011, 05/24/2012, 05/17/2017, 04/24/2018  . Influenza, High Dose Seasonal PF 06/05/2019  . Influenza,inj,Quad PF,6+ Mos 06/07/2014  . Influenza-Unspecified 06/09/2017, 06/05/2019  . Pneumococcal Conjugate-13 04/27/2014  . Pneumococcal Polysaccharide-23 05/24/2008, 05/17/2012, 01/24/2019  . Td 05/23/2009  . Tdap 01/24/2019    Past Medical History:  Diagnosis Date  . Allergic rhinitis   . Allergy   . Anxiety   .  Anxiety and depression   . Arthritis   . Asthma    years ago-1993-94  . Barrett's esophagus 05/2005  . Blood transfusion without reported diagnosis    as baby  . Cancer (HCC)    Basal cell face  . Cataract    bilateral removal  . Depression   . Diverticulosis   . Esophageal stricture   . Gastric polyps   . GERD (gastroesophageal reflux disease)   . Hiatal hernia   . Hypertension    on meds  . Morbid obesity (HCC)   . OSA (obstructive sleep apnea)   . Osteoporosis   . Pneumonia    many years ago  . Positive PPD   . Sleep apnea    bi pap    Tobacco History: Social History   Tobacco Use  Smoking Status Never Smoker  Smokeless Tobacco Never Used  Tobacco Comment   pt has never smoked but husband smoked   Counseling given: Not Answered Comment: pt has never smoked but husband smoked   Outpatient Medications Prior to Visit  Medication Sig Dispense Refill  . acetaminophen (TYLENOL) 325 MG tablet Take 650 mg by mouth every 6 (six) hours as needed. For pain    . ALPRAZolam (XANAX) 0.25 MG tablet Take 0.125 mg by mouth 3 (three) times daily as needed. For anxiety    . buPROPion (WELLBUTRIN XL) 300 MG 24 hr tablet Take 300 mg by mouth daily.    . Calcium Carbonate-Vitamin D 600-400 MG-UNIT per tablet Take 1 tablet by mouth daily.      .   dicyclomine (BENTYL) 10 MG capsule Take 1 capsule (10 mg total) by mouth 3 (three) times daily as needed for spasms. 90 capsule 11  . DULoxetine (CYMBALTA) 60 MG capsule Take 60 mg by mouth daily.    . famotidine (PEPCID) 20 MG tablet Take 20 mg by mouth at bedtime as needed. For allergies    . fluticasone (FLONASE) 50 MCG/ACT nasal spray PLACE 2 SPRAYS INTO BOTH NOSTRILS DAILY (Patient taking differently: Place 2 sprays into both nostrils at bedtime. ) 16 g 11  . HYDROcodone-acetaminophen (NORCO/VICODIN) 5-325 MG tablet Take 1 tablet by mouth every 4 (four) hours as needed for moderate pain. 30 tablet 0  . mirabegron ER (MYRBETRIQ) 25 MG  TB24 tablet Take 1 tablet by mouth daily.    . Multiple Vitamin (MULTIVITAMIN WITH MINERALS) TABS tablet Take 1 tablet by mouth daily.    . pantoprazole (PROTONIX) 40 MG tablet TAKE 1 TABLET TWICE DAILY. (30 MINUTES BEFORE 1ST AND LAST MEAL OF THE DAY) 60 tablet 11  . tolterodine (DETROL LA) 4 MG 24 hr capsule Take 4 mg by mouth at bedtime.    . triamterene-hydrochlorothiazide (MAXZIDE-25) 37.5-25 MG tablet 1 daily as needed for swelling 30 tablet 11   No facility-administered medications prior to visit.     Review of Systems:   Constitutional:   No  weight loss, night sweats,  Fevers, chills, + fatigue, or  lassitude.  HEENT:   No headaches,  Difficulty swallowing,  Tooth/dental problems, or  Sore throat,                No sneezing, itching, ear ache, nasal congestion, post nasal drip,   CV:  No chest pain,  Orthopnea, PND,  anasarca, dizziness, palpitations, syncope.   GI  No heartburn, indigestion, abdominal pain, nausea, vomiting, diarrhea, change in bowel habits, loss of appetite, bloody stools.   Resp: No shortness of breath with exertion or at rest.  No excess mucus, no productive cough,  No non-productive cough,  No coughing up of blood.  No change in color of mucus.  No wheezing.  No chest wall deformity  Skin: no rash or lesions.  GU: no dysuria, change in color of urine, no urgency or frequency.  No flank pain, no hematuria   MS:  No joint pain or swelling.  No decreased range of motion.  No back pain.    Physical Exam  BP (!) 152/80 (BP Location: Left Arm, Cuff Size: Normal)   Pulse 69   Ht 5' 2" (1.575 m)   Wt 185 lb (83.9 kg)   SpO2 98%   BMI 33.84 kg/m   GEN: A/Ox3; pleasant , NAD, well nourished    HEENT:  [place]/AT,  EACs-clear, TMs-wnl, NOSE-clear, THROAT-clear, no lesions, no postnasal drip or exudate noted.   NECK:  Supple w/ fair ROM; no JVD; normal carotid impulses w/o bruits; no thyromegaly or nodules palpated; no lymphadenopathy.    RESP  Clear  P &  A; w/o, wheezes/ rales/ or rhonchi. no accessory muscle use, no dullness to percussion  CARD:  RRR, no m/r/g, 1+ peripheral edema, pulses intact, no cyanosis or clubbing.  GI:   Soft & nt; nml bowel sounds; no organomegaly or masses detected.   Musco: Warm bil, no deformities or joint swelling noted.   Neuro: alert, no focal deficits noted.    Skin: Warm, scattered ecchymosis along arms and legs.  No petechiae or purpura     Lab Results:  CBC  Component Value Date/Time   WBC 8.0 06/05/2019 1148   RBC 4.11 06/05/2019 1148   HGB 12.7 06/05/2019 1148   HCT 38.5 06/05/2019 1148   PLT 372.0 06/05/2019 1148   MCV 93.8 06/05/2019 1148   MCH 29.8 10/28/2011 1335   MCHC 32.9 06/05/2019 1148   RDW 13.2 06/05/2019 1148   LYMPHSABS 2.6 06/05/2019 1148   MONOABS 0.6 06/05/2019 1148   EOSABS 0.2 06/05/2019 1148   BASOSABS 0.1 06/05/2019 1148    BMET    Component Value Date/Time   NA 142 06/05/2019 1148   K 3.9 06/05/2019 1148   CL 105 06/05/2019 1148   CO2 29 06/05/2019 1148   GLUCOSE 102 (H) 06/05/2019 1148   BUN 13 06/05/2019 1148   CREATININE 0.80 06/05/2019 1148   CALCIUM 9.5 06/05/2019 1148   GFRNONAA 93.07 06/03/2010 0906   GFRAA  11/03/2008 2125    >60        The eGFR has been calculated using the MDRD equation. This calculation has not been validated in all clinical situations. eGFR's persistently <60 mL/min signify possible Chronic Kidney Disease.    BNP No results found for: BNP  ProBNP    Component Value Date/Time   PROBNP 23.0 06/05/2019 1148    Imaging: No results found.    No flowsheet data found.  No results found for: NITRICOXIDE      Assessment & Plan:   Leg swelling Bilateral lower extremity swelling.  Normal BNP.  Venous Dopplers were negative.  Suspect she has underlying venous insufficiency and possible diastolic dysfunction. For now will use Maxide cautiously. Advised on support stockings and low-salt diet.  Reevaluate  next month.  If continues may need to consider 2D echo  Abnormal bruising Scattered ecchymosis along the arms and legs suspect this is just to thin her skin.  She is not on any blood thinners aspirin Plavix or chronic steroids. We will check CBC and LFTs Continue to monitor     Tammy Parrett, NP 12/13/2019  

## 2019-12-13 NOTE — Patient Instructions (Signed)
Take Maxzide daily for 3 days then As needed  Daily for swelling . Labs today  Low salt diet  Legs elevated.  Support stocking daily and remove At bedtime   Follow up next month with Dr. Melvyn Novas  And As needed   Please contact office for sooner follow up if symptoms do not improve or worsen or seek emergency care

## 2019-12-13 NOTE — Assessment & Plan Note (Signed)
Bilateral lower extremity swelling.  Normal BNP.  Venous Dopplers were negative.  Suspect she has underlying venous insufficiency and possible diastolic dysfunction. For now will use Maxide cautiously. Advised on support stockings and low-salt diet.  Reevaluate next month.  If continues may need to consider 2D echo

## 2019-12-14 LAB — CBC WITH DIFFERENTIAL/PLATELET
Basophils Absolute: 0.1 10*3/uL (ref 0.0–0.1)
Basophils Relative: 0.8 % (ref 0.0–3.0)
Eosinophils Absolute: 0.1 10*3/uL (ref 0.0–0.7)
Eosinophils Relative: 1.4 % (ref 0.0–5.0)
HCT: 40.3 % (ref 36.0–46.0)
Hemoglobin: 13.1 g/dL (ref 12.0–15.0)
Lymphocytes Relative: 28 % (ref 12.0–46.0)
Lymphs Abs: 2.4 10*3/uL (ref 0.7–4.0)
MCHC: 32.6 g/dL (ref 30.0–36.0)
MCV: 91.2 fl (ref 78.0–100.0)
Monocytes Absolute: 0.3 10*3/uL (ref 0.1–1.0)
Monocytes Relative: 3.9 % (ref 3.0–12.0)
Neutro Abs: 5.6 10*3/uL (ref 1.4–7.7)
Neutrophils Relative %: 65.9 % (ref 43.0–77.0)
Platelets: 354 10*3/uL (ref 150.0–400.0)
RBC: 4.41 Mil/uL (ref 3.87–5.11)
RDW: 13.6 % (ref 11.5–15.5)
WBC: 8.4 10*3/uL (ref 4.0–10.5)

## 2019-12-19 ENCOUNTER — Telehealth: Payer: Self-pay | Admitting: Adult Health

## 2019-12-19 DIAGNOSIS — R0602 Shortness of breath: Secondary | ICD-10-CM

## 2019-12-19 DIAGNOSIS — M7989 Other specified soft tissue disorders: Secondary | ICD-10-CM

## 2019-12-19 NOTE — Telephone Encounter (Signed)
Spoke with the pt The swelling is not changing since the last visit 12/13/19 She states that she is not able to keep her feet elevated b/c she is too busy to stay home all day  She states she has only taken her maxzide x 12 since the last visit bc she can barely make it to the bathroom bc she  goes so much She is asking what is causing the swelling and how she can manage this and be able to leave her house Please advise thanks

## 2019-12-19 NOTE — Telephone Encounter (Signed)
Will definitely need a full w/u    Start with echo bilateral venous doplers and ov same day and do u/a  bnp , tsh and    Set up televist next week with results but if making lots of urine as she describes the fluid would have to be going down unless drinking the same amt to replace it and fill the legs back up  - be very careful with any salt intake and use sugarless candy to help quench thirst in meantime   Try elastic hose if not already on them   Dx doe/ leg swelling

## 2019-12-19 NOTE — Telephone Encounter (Signed)
Will send to Dr. Melvyn Novas  To see if he has anything to add ?

## 2019-12-19 NOTE — Telephone Encounter (Signed)
Left message for patient to call back  

## 2019-12-20 ENCOUNTER — Telehealth: Payer: Self-pay | Admitting: Internal Medicine

## 2019-12-20 NOTE — Telephone Encounter (Signed)
LMTCB x2 for pt 

## 2019-12-20 NOTE — Telephone Encounter (Signed)
Per MW- Dexa done on 12/06/19 pos for osteoporosis- set up appt with TP to discuss options   Pt aware and appt scheduled

## 2019-12-22 NOTE — Telephone Encounter (Signed)
Spoke with pt. She is aware of Dr. Gustavus Bryant recommendations. Pt already has an appointment with Tammy on 12/25/2019. Order for venous doppler has been placed. Nothing further was needed.

## 2019-12-25 ENCOUNTER — Ambulatory Visit: Payer: PPO | Admitting: Adult Health

## 2019-12-26 ENCOUNTER — Ambulatory Visit (HOSPITAL_COMMUNITY)
Admission: RE | Admit: 2019-12-26 | Discharge: 2019-12-26 | Disposition: A | Discharge status: Home / Self Care | Payer: PPO | Source: Ambulatory Visit | Attending: Cardiovascular Disease | Admitting: Cardiovascular Disease

## 2019-12-26 ENCOUNTER — Other Ambulatory Visit: Payer: Self-pay

## 2019-12-26 DIAGNOSIS — M7989 Other specified soft tissue disorders: Secondary | ICD-10-CM | POA: Diagnosis not present

## 2019-12-26 DIAGNOSIS — R0602 Shortness of breath: Secondary | ICD-10-CM | POA: Diagnosis not present

## 2019-12-26 NOTE — Progress Notes (Signed)
Spoke with pt and notified of results per Dr. [name]. Pt verbalized understanding and denied any questions. 

## 2019-12-29 ENCOUNTER — Other Ambulatory Visit: Payer: Self-pay

## 2019-12-29 ENCOUNTER — Encounter: Payer: Self-pay | Admitting: Adult Health

## 2019-12-29 ENCOUNTER — Ambulatory Visit: Payer: PPO | Admitting: Adult Health

## 2019-12-29 ENCOUNTER — Encounter (HOSPITAL_COMMUNITY): Payer: PPO

## 2019-12-29 DIAGNOSIS — I872 Venous insufficiency (chronic) (peripheral): Secondary | ICD-10-CM

## 2019-12-29 DIAGNOSIS — G4733 Obstructive sleep apnea (adult) (pediatric): Secondary | ICD-10-CM

## 2019-12-29 DIAGNOSIS — M810 Age-related osteoporosis without current pathological fracture: Secondary | ICD-10-CM | POA: Diagnosis not present

## 2019-12-29 DIAGNOSIS — M7989 Other specified soft tissue disorders: Secondary | ICD-10-CM | POA: Diagnosis not present

## 2019-12-29 MED ORDER — ALENDRONATE SODIUM 70 MG PO TABS: 70.0000 mg | ORAL_TABLET | ORAL | 5 refills | Status: DC

## 2019-12-29 NOTE — Progress Notes (Signed)
@Patient  ID: [name], female    DOB: Oct 27, 1945, 74 y.o.   MRN: [id number]    Referring provider: Tanda Rockers, MD  HPI: 74 year old female never smoker followed for primary care by Dr. Melvyn Novas.  Medical history significant for morbid obesity, GERD, OSA, chronic rhinitis and hypertension  TEST/EVENTS :  Venous Dopplers October 2020 - for DVT bilaterally Chest x-ray June 2020 is clear  12/29/19 Follow up : Venous Insufficiency w/ edema , Rash , BMD results  Patient returns for a 1 month follow-up.  Patient is been having difficulty with lower extremity swelling over the last 6 to 8 months.  She was treated initially for mild cellulitis in October 2020.  Venous Dopplers were negative for PE DVTs.  She was felt to have some venous insufficiency with edema and recommend to use Maxide.  Lab work showed a normal BNP.  Patient says since last visit her leg swelling has improved she is taking her Maxide intermittently.  And it seems to be helping.  She is also keeping her legs elevated and sticking to a low-salt diet.  She has noticed that she has some intermittent redness and has had a rash over the last week.  She denies any fever, drainage, calf pain, known injury.  Patient recently had her bone density exam.  This was done at outlying facility.  Results were called to patient that she has osteoporosis per Dr. Melvyn Novas.  We went over osteoporosis.  Patient education on exercise and weightbearing exercises.  We discussed treatment options including bisphosphonate.  She is in agreement to begin Fosamax.  Patient education on Fosamax and importance of taking medication correctly.  Allergies  Allergen Reactions  . Epinephrine     REACTION: tachycardia    Immunization History  Administered Date(s) Administered  . Influenza Split 05/24/2013, 05/10/2015, 05/23/2016  . Influenza Whole 06/24/2009, 05/24/2010, 05/25/2011, 05/24/2012, 05/17/2017, 04/24/2018  . Influenza, High Dose Seasonal PF  06/05/2019  . Influenza,inj,Quad PF,6+ Mos 06/07/2014  . Influenza-Unspecified 06/09/2017, 06/05/2019  . Pneumococcal Conjugate-13 04/27/2014  . Pneumococcal Polysaccharide-23 05/24/2008, 05/17/2012, 01/24/2019  . Td 05/23/2009  . Tdap 01/24/2019    Past Medical History:  Diagnosis Date  . Allergic rhinitis   . Allergy   . Anxiety   . Anxiety and depression   . Arthritis   . Asthma    years ago-1993-94  . Barrett's esophagus 05/2005  . Blood transfusion without reported diagnosis    as baby  . Cancer ([place])    Basal cell face  . Cataract    bilateral removal  . Depression   . Diverticulosis   . Esophageal stricture   . Gastric polyps   . GERD (gastroesophageal reflux disease)   . Hiatal hernia   . Hypertension    on meds  . Morbid obesity ([place])   . OSA (obstructive sleep apnea)   . Osteoporosis   . Pneumonia    many years ago  . Positive PPD   . Sleep apnea    bi pap    Tobacco History: Social History   Tobacco Use  Smoking Status Never Smoker  Smokeless Tobacco Never Used  Tobacco Comment   pt has never smoked but husband smoked   Counseling given: Not Answered Comment: pt has never smoked but husband smoked   Outpatient Medications Prior to Visit  Medication Sig Dispense Refill  . acetaminophen (TYLENOL) 325 MG tablet Take 650 mg by mouth every 6 (six) hours as needed. For pain    .  ALPRAZolam (XANAX) 0.25 MG tablet Take 0.125 mg by mouth 3 (three) times daily as needed. For anxiety    . buPROPion (WELLBUTRIN XL) 300 MG 24 hr tablet Take 300 mg by mouth daily.    . Calcium Carbonate-Vitamin D 600-400 MG-UNIT per tablet Take 1 tablet by mouth daily.      [name] dicyclomine (BENTYL) 10 MG capsule Take 1 capsule (10 mg total) by mouth 3 (three) times daily as needed for spasms. 90 capsule 11  . DULoxetine (CYMBALTA) 60 MG capsule Take 60 mg by mouth daily.    . famotidine (PEPCID) 20 MG tablet Take 20 mg by mouth at bedtime as needed. For allergies    .  fluticasone (FLONASE) 50 MCG/ACT nasal spray PLACE 2 SPRAYS INTO BOTH NOSTRILS DAILY (Patient taking differently: Place 2 sprays into both nostrils at bedtime. ) 16 g 11  . HYDROcodone-acetaminophen (NORCO/VICODIN) 5-325 MG tablet Take 1 tablet by mouth every 4 (four) hours as needed for moderate pain. 30 tablet 0  . mirabegron ER (MYRBETRIQ) 25 MG TB24 tablet Take 1 tablet by mouth daily.    . Multiple Vitamin (MULTIVITAMIN WITH MINERALS) TABS tablet Take 1 tablet by mouth daily.    . pantoprazole (PROTONIX) 40 MG tablet TAKE 1 TABLET TWICE DAILY. (30 MINUTES BEFORE 1ST AND LAST MEAL OF THE DAY) 60 tablet 11  . tolterodine (DETROL LA) 4 MG 24 hr capsule Take 4 mg by mouth at bedtime.    . triamterene-hydrochlorothiazide (MAXZIDE-25) 37.5-25 MG tablet 1 daily as needed for swelling 30 tablet 5   No facility-administered medications prior to visit.     Review of Systems:   Constitutional:   No  weight loss, night sweats,  Fevers, chills, fatigue, or  lassitude.  HEENT:   No headaches,  Difficulty swallowing,  Tooth/dental problems, or  Sore throat,                No sneezing, itching, ear ache, nasal congestion, post nasal drip,   CV:  No chest pain,  Orthopnea, PND, +swelling in lower extremities, NO anasarca, dizziness, palpitations, syncope.   GI  No heartburn, indigestion, abdominal pain, nausea, vomiting, diarrhea, change in bowel habits, loss of appetite, bloody stools.   Resp: No shortness of breath with exertion or at rest.  No excess mucus, no productive cough,  No non-productive cough,  No coughing up of blood.  No change in color of mucus.  No wheezing.  No chest wall deformity  Skin: + LE rash   GU: no dysuria, change in color of urine, no urgency or frequency.  No flank pain, no hematuria   MS:  No joint pain or swelling.  No decreased range of motion.  No back pain.    Physical Exam  BP 130/72 (BP Location: Left Arm, Cuff Size: Normal)   Pulse 73   Wt 186 lb (84.4  kg)   SpO2 97%   BMI 34.02 kg/m   GEN: A/Ox3; pleasant , NAD, well nourished    HEENT:  [place]/AT,   NOSE-clear, THROAT-clear, no lesions, no postnasal drip or exudate noted.   NECK:  Supple w/ fair ROM; no JVD; normal carotid impulses w/o bruits; no thyromegaly or nodules palpated; no lymphadenopathy.    RESP  Clear  P & A; w/o, wheezes/ rales/ or rhonchi. no accessory muscle use, no dullness to percussion  CARD:  RRR, no m/r/g, no peripheral edema, pulses intact, no cyanosis or clubbing.  GI:   Soft & nt; nml  bowel sounds; no organomegaly or masses detected.   Musco: Warm bil, no deformities or joint swelling noted.   Neuro: alert, no focal deficits noted.    Skin: Warm, mild stasis dermatitis of the lower extremities.  No red streaking.  Negative for calf pain.  Decreased swelling with residual trace to 1+ edema bilaterally.  Improved from last visit.    Lab Results:  CBC    Component Value Date/Time   WBC 8.4 12/13/2019 1005   RBC 4.41 12/13/2019 1005   HGB 13.1 12/13/2019 1005   HCT 40.3 12/13/2019 1005   PLT 354.0 12/13/2019 1005   MCV 91.2 12/13/2019 1005   MCH 29.8 10/28/2011 1335   MCHC 32.6 12/13/2019 1005   RDW 13.6 12/13/2019 1005   LYMPHSABS 2.4 12/13/2019 1005   MONOABS 0.3 12/13/2019 1005   EOSABS 0.1 12/13/2019 1005   BASOSABS 0.1 12/13/2019 1005    BMET    Component Value Date/Time   NA 142 12/13/2019 1005   K 3.7 12/13/2019 1005   CL 103 12/13/2019 1005   CO2 32 12/13/2019 1005   GLUCOSE 99 12/13/2019 1005   BUN 10 12/13/2019 1005   CREATININE 0.73 12/13/2019 1005   CALCIUM 9.7 12/13/2019 1005   GFRNONAA 93.07 06/03/2010 0906   GFRAA  11/03/2008 2125    >60        The eGFR has been calculated using the MDRD equation. This calculation has not been validated in all clinical situations. eGFR's persistently <60 mL/min signify possible Chronic Kidney Disease.    BNP No results found for: BNP  ProBNP    Component Value Date/Time    PROBNP 23.0 06/05/2019 1148    Imaging: VAS [place] LOWER EXTREMITY VENOUS (DVT)  Result Date: 12/27/2019  Lower Venous DVTStudy Other Indications: Patient complains of bilateral calf swelling for several                    weeks. She denies any shortness of breath. Risk Factors: None identified. Performing Technologist: Wilkie Aye RVT  Examination Guidelines: A complete evaluation includes B-mode imaging, spectral Doppler, color Doppler, and power Doppler as needed of all accessible portions of each vessel. Bilateral testing is considered an integral part of a complete examination. Limited examinations for reoccurring indications may be performed as noted. The reflux portion of the exam is performed with the patient in reverse Trendelenburg.  +---------+---------------+---------+-----------+----------+--------------+ RIGHT    CompressibilityPhasicitySpontaneityPropertiesThrombus Aging +---------+---------------+---------+-----------+----------+--------------+ CFV      Full           Yes      Yes                                 +---------+---------------+---------+-----------+----------+--------------+ SFJ      Full           Yes      Yes                                 +---------+---------------+---------+-----------+----------+--------------+ FV Prox  Full           Yes      Yes                                 +---------+---------------+---------+-----------+----------+--------------+ FV Mid   Full  Yes      Yes                                 +---------+---------------+---------+-----------+----------+--------------+ FV DistalFull           Yes      Yes                                 +---------+---------------+---------+-----------+----------+--------------+ PFV      Full                                                        +---------+---------------+---------+-----------+----------+--------------+ POP      Full           Yes      Yes                                  +---------+---------------+---------+-----------+----------+--------------+ PTV      Full           Yes      Yes                                 +---------+---------------+---------+-----------+----------+--------------+ PERO     Full           Yes      Yes                                 +---------+---------------+---------+-----------+----------+--------------+ Gastroc  Full                                                        +---------+---------------+---------+-----------+----------+--------------+ GSV      Full           Yes      Yes                                 +---------+---------------+---------+-----------+----------+--------------+   +---------+---------------+---------+-----------+----------+--------------+ LEFT     CompressibilityPhasicitySpontaneityPropertiesThrombus Aging +---------+---------------+---------+-----------+----------+--------------+ CFV      Full           Yes      Yes                                 +---------+---------------+---------+-----------+----------+--------------+ SFJ      Full           Yes      Yes                                 +---------+---------------+---------+-----------+----------+--------------+ FV Prox  Full           Yes      Yes                                 +---------+---------------+---------+-----------+----------+--------------+  FV Mid   Full           Yes      Yes                                 +---------+---------------+---------+-----------+----------+--------------+ FV DistalFull           Yes      Yes                                 +---------+---------------+---------+-----------+----------+--------------+ PFV      Full                                                        +---------+---------------+---------+-----------+----------+--------------+ POP      Full           Yes      Yes                                  +---------+---------------+---------+-----------+----------+--------------+ PTV      Full           Yes      Yes                                 +---------+---------------+---------+-----------+----------+--------------+ PERO     Full           Yes      Yes                                 +---------+---------------+---------+-----------+----------+--------------+ Gastroc  Full                                                        +---------+---------------+---------+-----------+----------+--------------+ GSV      Full           Yes      Yes                                 +---------+---------------+---------+-----------+----------+--------------+     Summary: RIGHT: - No evidence of deep vein thrombosis in the lower extremity. No indirect evidence of obstruction proximal to the inguinal ligament. - No cystic structure found in the popliteal fossa.  LEFT: - No evidence of deep vein thrombosis in the lower extremity. No indirect evidence of obstruction proximal to the inguinal ligament. - No cystic structure found in the popliteal fossa.  *See table(s) above for measurements and observations. Electronically signed by Larae Grooms MD on 12/27/2019 at 11:51:57 AM.    Final       No flowsheet data found.  No results found for: NITRICOXIDE      Assessment & Plan:   No problem-specific Assessment & Plan notes found for this encounter.     Rexene Edison, NP 12/29/2019

## 2019-12-29 NOTE — Patient Instructions (Addendum)
Begin Fosamax 70mg  weekly. Take with plenty of fluids , stay up for >2hr after taking .  Take Maxzide daily for swelling . Low salt diet  Legs elevated.  Support stocking daily and remove At bedtime   Hydrocortisone to rash Twice daily  For 1 week,,if not resolving will refer to dermatology .  Follow up with Dr. Melvyn Novas  In 2 months and As needed   Please contact office for sooner follow up if symptoms do not improve or worsen or seek emergency care

## 2020-01-01 DIAGNOSIS — M810 Age-related osteoporosis without current pathological fracture: Secondary | ICD-10-CM | POA: Insufficient documentation

## 2020-01-01 NOTE — Assessment & Plan Note (Signed)
Recent bone mineral density report per Dr. Melvyn Novas was positive for osteoporosis.  Will obtain report and scanned into epic. Discussed osteoporosis diagnosis and treatment options including bisphosphonates.  Patient education was given on Fosamax.  Patient is in agreement to begin.  Plan  Patient Instructions  Begin Fosamax 70mg  weekly. Take with plenty of fluids , stay up for >2hr after taking .  Take Maxzide daily for swelling . Low salt diet  Legs elevated.  Support stocking daily and remove At bedtime   Hydrocortisone to rash Twice daily  For 1 week,,if not resolving will refer to dermatology .  Follow up with Dr. Melvyn Novas  In 2 months and As needed   Please contact office for sooner follow up if symptoms do not improve or worsen or seek emergency care    n

## 2020-01-01 NOTE — Assessment & Plan Note (Signed)
Lower extremity leg swelling most likely venous insufficiency.  Patient is to continue low-salt diet Maxide as needed.  Leg elevation and support stockings.  Previous evaluation showed negative venous Dopplers.  BNP was normal.

## 2020-01-01 NOTE — Assessment & Plan Note (Signed)
Continue on nocturnal BiPAP 

## 2020-01-01 NOTE — Assessment & Plan Note (Signed)
Venous insufficiency with mild stasis dermatitis.  Advised to keep legs elevated support stockings and a low-salt diet.  May try hydrocortisone cream twice daily to lower extremities for 1 week.  If rash does not resolve will need referral to dermatology.  No signs of cellulitis currently.  Plan  Patient Instructions  Begin Fosamax 70mg  weekly. Take with plenty of fluids , stay up for >2hr after taking .  Take Maxzide daily for swelling . Low salt diet  Legs elevated.  Support stocking daily and remove At bedtime   Hydrocortisone to rash Twice daily  For 1 week,,if not resolving will refer to dermatology .  Follow up with Dr. Melvyn Novas  In 2 months and As needed   Please contact office for sooner follow up if symptoms do not improve or worsen or seek emergency care

## 2020-01-03 ENCOUNTER — Ambulatory Visit: Payer: PPO | Admitting: Internal Medicine

## 2020-01-08 IMAGING — RF DG ERCP WO/W SPHINCTEROTOMY
1 series · 14 of 14 positions shown · non-contrast
Comparison: Ultrasound 09/16/2017

CLINICAL DATA: Common bile duct calculi.

EXAM:
ERCP
TECHNIQUE: Multiple spot images obtained with the fluoroscopic device and
submitted for interpretation post-procedure.
FLUOROSCOPY TIME:  Fluoroscopy Time:  4 minutes and 29 seconds
Number of Acquired Spot Images: 14

[Series 1: run · 14 of 14 slices shown]
[im 1/14]
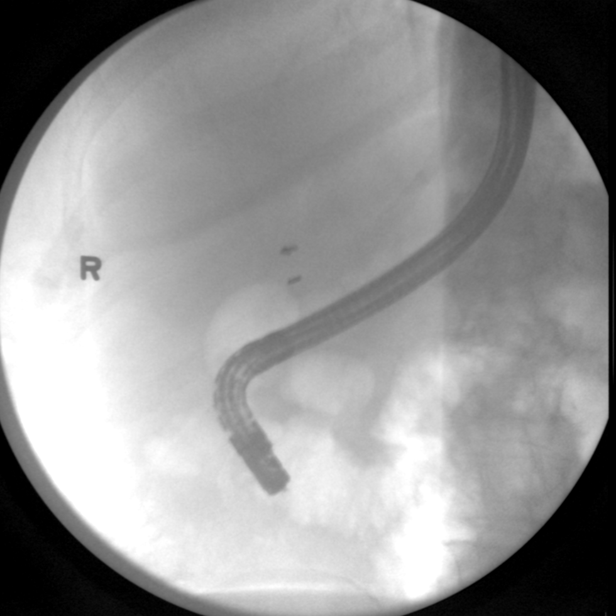
[im 2/14]
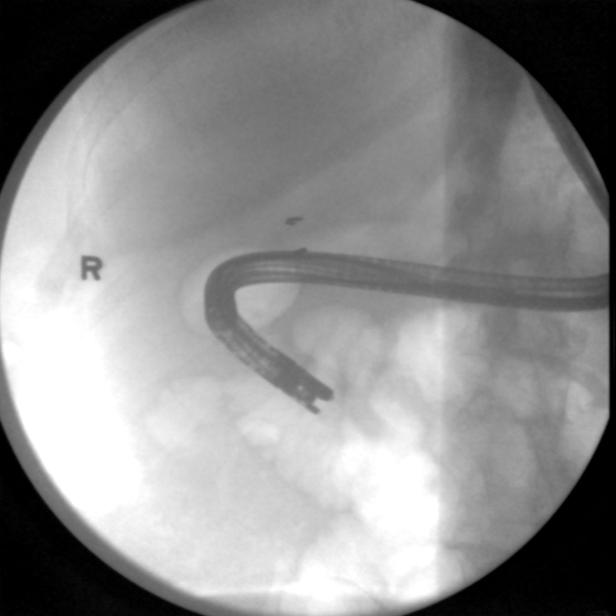
[im 3/14]
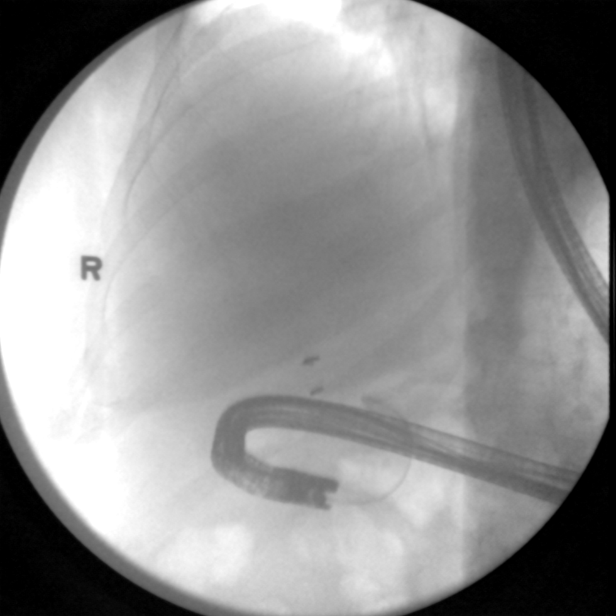
[im 4/14]
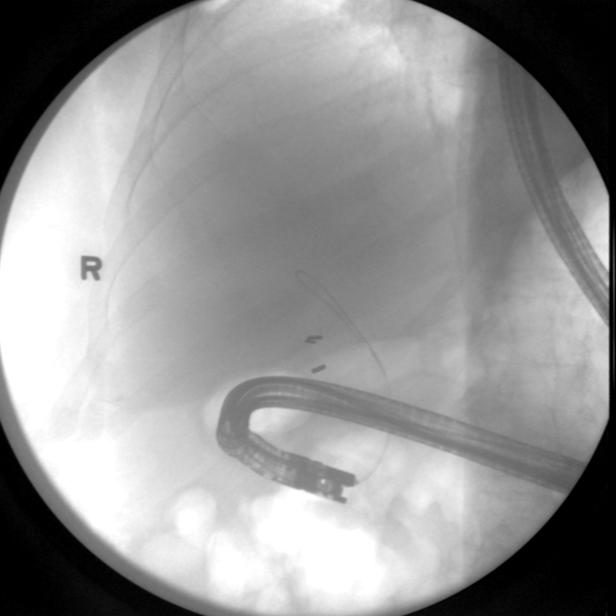
[im 5/14]
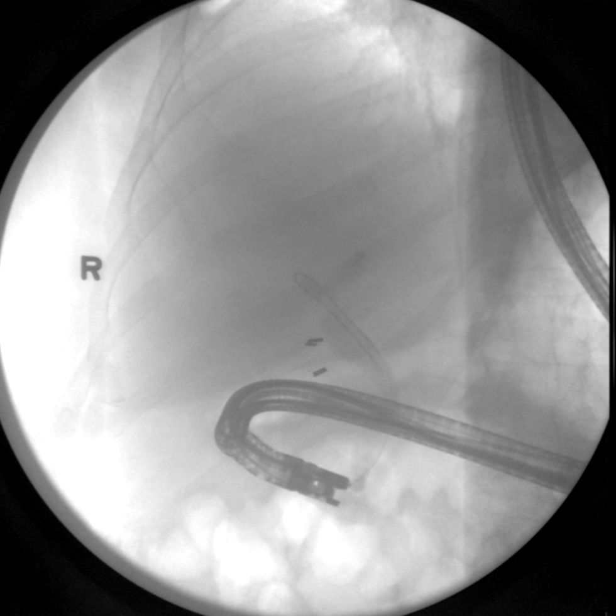
[im 6/14]
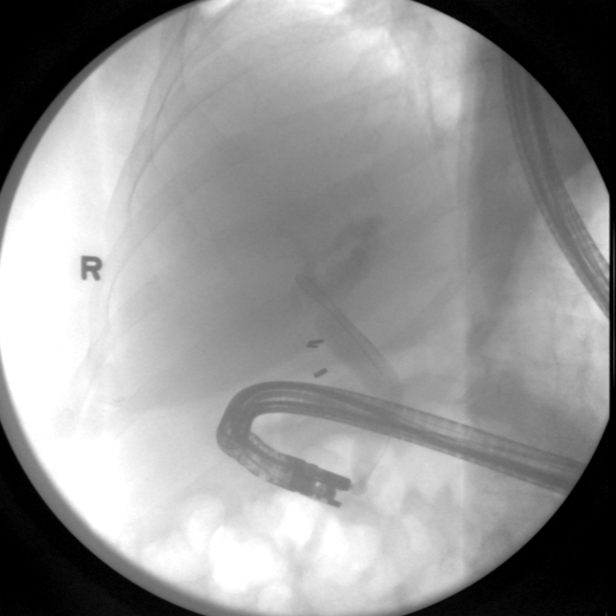
[im 7/14]
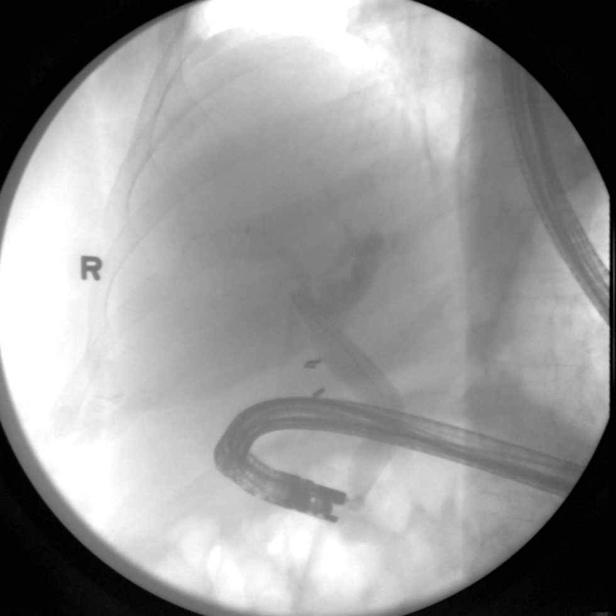
[im 8/14]
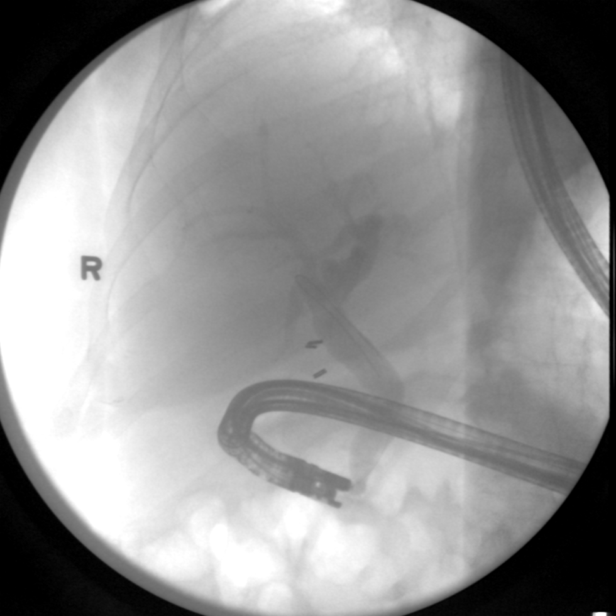
[im 9/14]
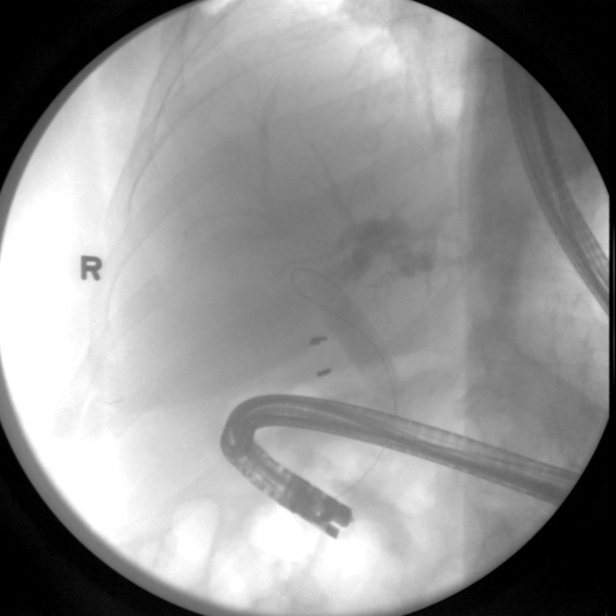
[im 10/14]
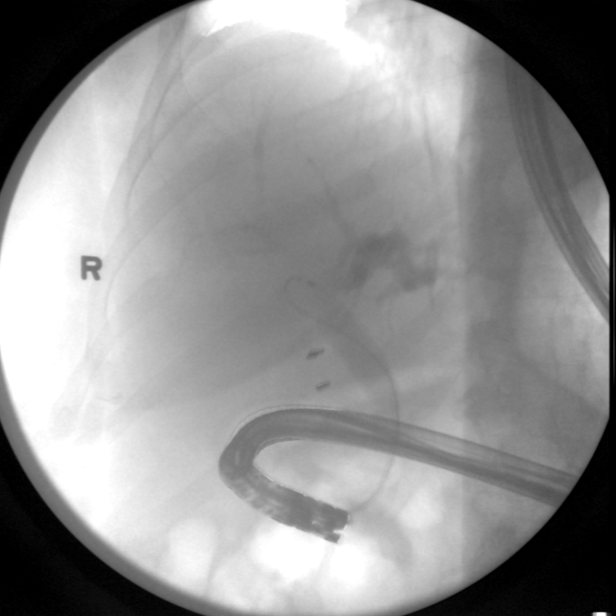
[im 11/14]
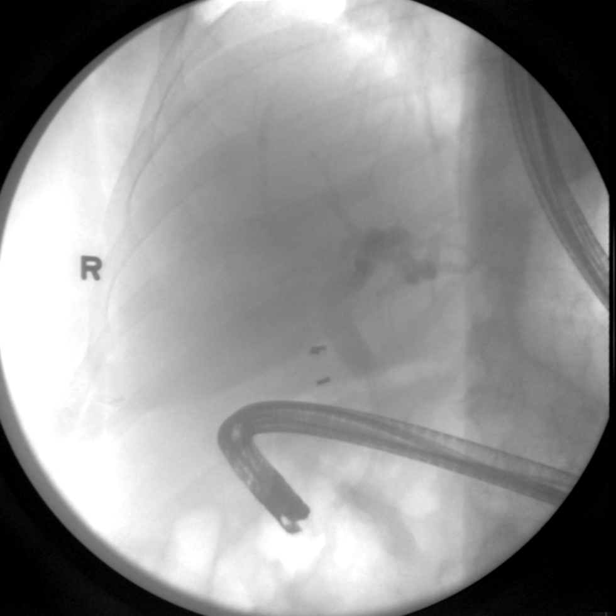
[im 12/14]
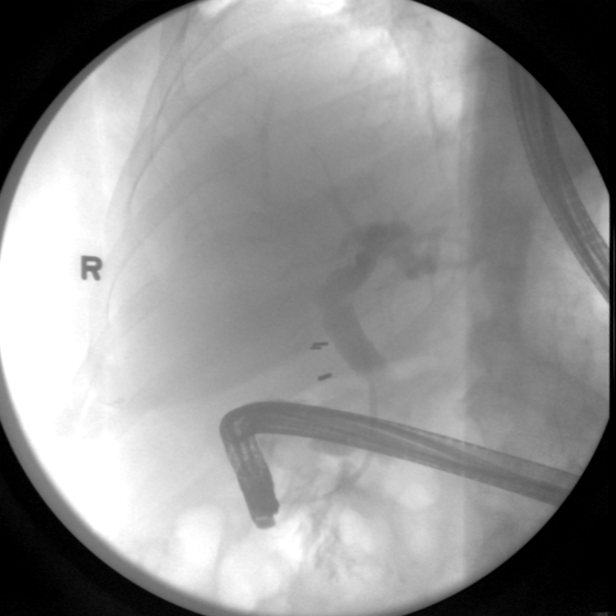
[im 13/14]
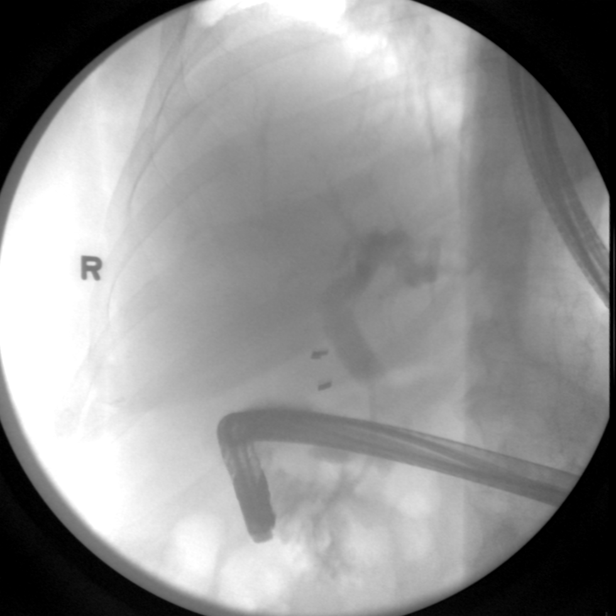
[im 14/14]
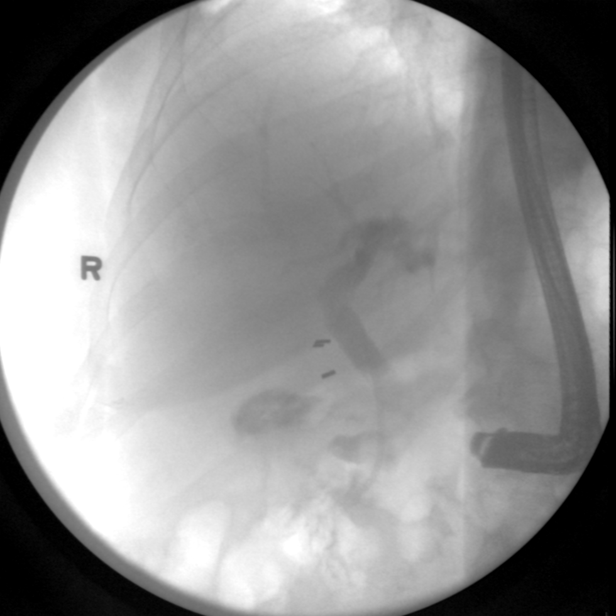

[14 of 14 positions shown; findings below may reference images not displayed]

FINDINGS: Common bile duct was successfully cannulated and opacified with
contrast. The extrahepatic biliary system was dilated. Possible
filling defect in the distal common bile duct was compatible with a
stone. Nonmetallic biliary stent was placed. Evidence for a
periampullary diverticulum.
IMPRESSION: Dilated common bile duct.

Placement of biliary stent.

These images were submitted for radiologic interpretation only.
Please see the procedural report for the amount of contrast and the
fluoroscopy time utilized.

## 2020-01-25 ENCOUNTER — Other Ambulatory Visit: Payer: Self-pay | Admitting: Internal Medicine

## 2020-01-30 DIAGNOSIS — M7541 Impingement syndrome of right shoulder: Secondary | ICD-10-CM | POA: Diagnosis not present

## 2020-01-30 DIAGNOSIS — M7542 Impingement syndrome of left shoulder: Secondary | ICD-10-CM | POA: Insufficient documentation

## 2020-01-30 DIAGNOSIS — M25511 Pain in right shoulder: Secondary | ICD-10-CM | POA: Diagnosis not present

## 2020-01-30 DIAGNOSIS — M25512 Pain in left shoulder: Secondary | ICD-10-CM | POA: Diagnosis not present

## 2020-02-12 ENCOUNTER — Ambulatory Visit: Payer: PPO | Admitting: Gastroenterology

## 2020-02-29 DIAGNOSIS — R351 Nocturia: Secondary | ICD-10-CM | POA: Diagnosis not present

## 2020-03-07 ENCOUNTER — Other Ambulatory Visit: Payer: Self-pay

## 2020-03-07 ENCOUNTER — Encounter: Payer: Self-pay | Admitting: Internal Medicine

## 2020-03-07 ENCOUNTER — Ambulatory Visit: Payer: PPO | Admitting: Internal Medicine

## 2020-03-07 DIAGNOSIS — G4733 Obstructive sleep apnea (adult) (pediatric): Secondary | ICD-10-CM | POA: Diagnosis not present

## 2020-03-07 DIAGNOSIS — M810 Age-related osteoporosis without current pathological fracture: Secondary | ICD-10-CM

## 2020-03-07 DIAGNOSIS — M8580 Other specified disorders of bone density and structure, unspecified site: Secondary | ICD-10-CM | POA: Diagnosis not present

## 2020-03-07 NOTE — Progress Notes (Signed)
Subjective:   Patient ID: [name], female    DOB: 05/06/1946     MRN: [id number]   Brief patient profile:  72 yowf never smoker with morbid obesity complicated by gastroesophageal reflux disease, and also a history of obstructive sleep apnea and chronic rhinitis/ hbp.   History of Present Illness  04/27/2014 f/u ov/Spenser Cong re: obesity/ ohs/hbp/djd  Chief Complaint  Patient presents with  . Annual Exam    Pt fasting. Overall doing well and denies any co's today.  no reg exercise, very tired in pms  > working out issues of sleep with [name] but he has not reviewed download yet rec Please remember to go to the lab and x-ray department downstairs for your tests - we will call you with the results when they are available. Prevnar today  Dr [name] will be in touch re your bipap settings    05/20/2018  f/u ov/Uziah Sorter re:  [name] using ppi bid ac but not pepcid / new hoarseness x 26m/ plus hbp/ MO Chief Complaint  Patient presents with  . Acute Visit    Hoarseness off and on for the past 4 months.   Dyspnea:  Not limited by breathing from desired activities  / no aerobic at all now  Cough:clearing throat more s pattern but  not noct/ notes more at hosp with voice use  Sleeping: cpap flat one pillow rec Add pepcid 20 mg at bedtime GERD diet  Please see patient coordinator before you leave today  to schedule ENT eval for hoarseness>  Saw [name] 06/07/18  dx chronic nasopharyngitis and dysphonia> improved with abx/ ns irrigation   08/02/2018  f/u ov/Krishay Faro re: hbp /gerd/ ha generalized  In pm's better p tylenol  Chief Complaint  Patient presents with  . Follow-up    Doing well overall. She has HA on days that it rains.   Dyspnea:  Not limited by breathing from desired activities  / busy helping take care of son with disabilities with new tension pattern HA  Cough: none now  Sleeping: bipap/ flat on one pillow  rec No change in medications - remember elastic hose    01/24/2019  f/u ov/Avril Busser  re:  Cpx:  Hbp, edema, MO  Chief Complaint  Patient presents with  . Anxiety  Dyspnea:  Yardwork/no aerobics Cough: none  Sleeping: cpap per sood  SABA use: none 02: none  Viibryd per [name] helping anxiety rec No change rx  06/05/19 NP eval for leg swelling ? Cellulitis  rec Venous dopplers > neg venous dopplers bilaterally 06/06/19  Legs elevated, low salt diet .  Please take Maxzide daily for 3 days , then As needed  Leg swelling  Doxycycline 100mg  Twice daily  For 1 week.  Warm compresses to lower right leg daily .  Keep legs clean and dry .        03/07/2020  f/u ov/Isaiha Asare re:  Obesity/ s/p covid 19 vaccination  Chief Complaint  Patient presents with  . Follow-up    No complaints  Dyspnea:  Not limited by breathing from desired activities  / doing yardwork Cough: none Sleeping: fine on cpap/bed is flat/ several pillows SABA use: none 02: none    No obvious day to day or daytime variability or assoc excess/ purulent sputum or mucus plugs or hemoptysis or cp or chest tightness, subjective wheeze or overt sinus or hb symptoms.   Sleeping  without nocturnal  or early am exacerbation  of respiratory  c/o's or need  for noct saba. Also denies any obvious fluctuation of symptoms with weather or environmental changes or other aggravating or alleviating factors except as outlined above   No unusual exposure hx or h/o childhood pna/ asthma or knowledge of premature birth.  Current Allergies, Complete Past Medical History, Past Surgical History, Family History, and Social History were reviewed in [employer] record.  ROS  The following are not active complaints unless bolded Hoarseness, sore throat, dysphagia, dental problems, itching, sneezing,  nasal congestion or discharge of excess mucus or purulent secretions, ear ache,   fever, chills, sweats, unintended wt loss or wt gain, classically pleuritic or exertional cp,  orthopnea pnd or arm/hand swelling  or  leg swelling, presyncope, palpitations, abdominal pain, anorexia, nausea, vomiting, diarrhea  or change in bowel habits or change in bladder habits, change in stools or change in urine, dysuria, hematuria,  rash, arthralgias, visual complaints, headache, numbness, weakness or ataxia or problems with walking or coordination,  change in mood or  memory.        Current Meds  Medication Sig  . acetaminophen (TYLENOL) 325 MG tablet Take 650 mg by mouth every 6 (six) hours as needed. For pain  . alendronate (FOSAMAX) 70 MG tablet Take 1 tablet (70 mg total) by mouth once a week. Take with a full glass of water on an empty stomach.  . ALPRAZolam (XANAX) 0.25 MG tablet Take 0.125 mg by mouth 3 (three) times daily as needed. For anxiety  . buPROPion (WELLBUTRIN XL) 300 MG 24 hr tablet Take 300 mg by mouth daily.  . Calcium Carbonate-Vitamin D 600-400 MG-UNIT per tablet Take 1 tablet by mouth daily.    [name] dicyclomine (BENTYL) 10 MG capsule Take 1 capsule (10 mg total) by mouth 3 (three) times daily as needed for spasms.  . famotidine (PEPCID) 20 MG tablet Take 20 mg by mouth at bedtime as needed. For allergies  . fluticasone (FLONASE) 50 MCG/ACT nasal spray 2 SPRAYS IN EACH NOSTRIL ONCE A DAY.  . mirabegron ER (MYRBETRIQ) 25 MG TB24 tablet Take 1 tablet by mouth daily.  . Multiple Vitamin (MULTIVITAMIN WITH MINERALS) TABS tablet Take 1 tablet by mouth daily.  . pantoprazole (PROTONIX) 40 MG tablet TAKE 1 TABLET TWICE DAILY. ([place] LAST MEAL OF THE DAY)  . tolterodine (DETROL LA) 4 MG 24 hr capsule Take 4 mg by mouth at bedtime.  . triamterene-hydrochlorothiazide (MAXZIDE-25) 37.5-25 MG tablet 1 daily as needed for swelling                   Past Medical History:  HIATAL HERNIA (ICD-553.3) with GERD  - H/o Barretts esophagus  - EGD pos HH/ stricture 04/18/10 , neg barrett's  RHINITIS, CHRONIC (ICD-472.0) .................................................................... [name]   - Remote allergy testing = molds/dust only  - Sinus CT 09/06/08 mild thickening only  OBSTRUCTIVE SLEEP APNEA (ICD-327.23)..................................................[name]  --PSG 03/31/02 AHI 64  --Nocturnal BIPAP > titration done 05/16/2015  MORBID OBESITY (ICD-278.01)  - Target wt = 158 for BMI < 30  POSITIVE PPD (ICD-795.5)  DIVERTICULOSIS  Shingles R T5/6 03/2015  Depression / Anxiety ..............................................................[name] Karr Stasis dermatitis ..................................................................... [facility] [employer] MA[employer]........................................................Marland KitchenWert  - DT May 23, 2009,    01/24/2019  - Pneumovax 04/2013, Prevnar 04/27/2014, Pneumovax 01/24/2019  - CPX 01/24/2019  - GYN Levoix        Family History:  Father - CAD, ? 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  Never smoker, but Husband smoked / passed [id number] Illicit Drug Use - no  Patient does not get regular exercise.  No etoh          Objective:   Physical Exam   03/07/2020  185   01/24/2019   174     wt 224 May 23, 2009 > 226 September 10, 2009 > 229 November 05, 2009 > 232 June 03, 2010 > 224 05/25/2011 >230 01/11/12 > 03/04/2012  225 > 05/17/2012  219 >217 07/29/2012 > 219 09/06/2012 >223 03/16/2013 > 04/11/2013  225 > 05/22/2013  225 > 225 06/23/2013 >  02/27/2014 215 > 04/27/2014  215 >  04/24/2015  222>  12/24/2015   214 >  03/02/2016  214 > 07/20/2016  220 > 08/20/2016  219 >  12/24/2016   211 > 02/12/2017  210 > 06/28/2017   207  > 01/14/2018  187  > 05/20/2018  194  > 08/02/2018  196  >  01/24/2019   174        amb pleasant obese  wf nad       Vital signs reviewed  03/07/2020  - Note at rest 02 sats  97% on RA   HEENT : pt wearing mask not removed for exam due to covid -19 concerns.    NECK :  without JVD/Nodes/TM/ nl carotid upstrokes bilaterally   LUNGS: no acc muscle use,  Nl contour chest which is clear to A and P bilaterally without cough on insp or exp maneuvers   CV:  RRR  no s3 or murmur or increase in P2, and no edema   ABD: obese  soft and nontender with nl inspiratory excursion in the supine position. No bruits or organomegaly appreciated, bowel sounds nl  MS:  Nl gait/ ext warm without deformities, calf tenderness, cyanosis or clubbing No obvious joint restrictions   SKIN: warm and dry with mild venous stasis changes both legs but nothing cellulitic now   NEURO:  alert, approp, nl sensorium with  no motor or cerebellar deficits apparent.                       Assessment & Plan:

## 2020-03-07 NOTE — Patient Instructions (Addendum)
To get the most out of exercise, you need to be continuously aware that you are short of breath, but never out of breath, for 30 minutes daily. As you improve, it will actually be easier for you to do the same amount of exercise  in  30 minutes so always push to the level where you are short of breath.     Please schedule a follow up visit in 3 months but call sooner if needed for CPX on return

## 2020-03-09 ENCOUNTER — Encounter: Payer: Self-pay | Admitting: Internal Medicine

## 2020-03-09 NOTE — Assessment & Plan Note (Addendum)
on Bipap chronically> retitrated 05/16/2015  - HC03 up to 33 on bmet 12/20/13 > referred back to Dr [name] - HC03  12/24/2016  = 34   - HC03 01/14/2018 = 31  - HC03 01/24/2019   = 34  - HC03 12/13/19     =32   hc03 slt improved, no change rx needed > f/u Dr [name] as planned  Etiology and pathophysiology of osa including relationship to obesity reviewed in detail

## 2020-03-09 NOTE — Assessment & Plan Note (Addendum)
dx 12/06/19   - Dexa 06/15/11  Spine  0.2  L Fem Neck -1.1,   R Fem Neck -1.8 -  07/05/14  R Fem neck =  -1.5     -   01/06/16   R Fem neck = - 1.5  -   12/06/19   R Fem          = 1.8 and R forearm - 3.4 >   -    01/01/20 rx fosfamax  Advised re how to take fosfamax correctly,  Beware of GERD risk and low threshold to change to reclast or prolia  >>> f/u 3 months cpx          Each maintenance medication was reviewed in detail including emphasizing most importantly the difference between maintenance and prns and under what circumstances the prns are to be triggered using an action plan format where appropriate.  Total time for H and P, chart review, counseling,  and generating customized AVS unique to this office visit / charting = 20 min

## 2020-03-09 NOTE — Assessment & Plan Note (Signed)
Target wt 158 for bmi < 30   Body mass index is 33.98 kg/m.  -  Trending up again  Lab Results  Component Value Date   TSH 0.91 01/24/2019     Contributing to gerd risk/ doe/reviewed the need and the process to achieve and maintain neg calorie balance    >>> f/u cpx due

## 2020-03-11 DIAGNOSIS — M25561 Pain in right knee: Secondary | ICD-10-CM | POA: Diagnosis not present

## 2020-03-11 DIAGNOSIS — M25562 Pain in left knee: Secondary | ICD-10-CM | POA: Diagnosis not present

## 2020-04-15 DIAGNOSIS — I83893 Varicose veins of bilateral lower extremities with other complications: Secondary | ICD-10-CM | POA: Diagnosis not present

## 2020-04-15 DIAGNOSIS — I8311 Varicose veins of right lower extremity with inflammation: Secondary | ICD-10-CM | POA: Diagnosis not present

## 2020-04-15 DIAGNOSIS — I8312 Varicose veins of left lower extremity with inflammation: Secondary | ICD-10-CM | POA: Diagnosis not present

## 2020-04-20 ENCOUNTER — Other Ambulatory Visit: Payer: Self-pay | Admitting: Gastroenterology

## 2020-04-22 ENCOUNTER — Telehealth: Payer: Self-pay | Admitting: Gastroenterology

## 2020-04-22 NOTE — Telephone Encounter (Signed)
Pt is scheduled for 10/13 Dr Fuller Plan, but needs refill on her Protonix.

## 2020-04-23 MED ORDER — PANTOPRAZOLE SODIUM 40 MG PO TBEC: DELAYED_RELEASE_TABLET | ORAL | 1 refills | Status: DC

## 2020-04-23 NOTE — Telephone Encounter (Signed)
Prescription sent to patient's pharmacy and patient informed on prescription to keep appt for further refills.

## 2020-04-30 DIAGNOSIS — Z01419 Encounter for gynecological examination (general) (routine) without abnormal findings: Secondary | ICD-10-CM | POA: Diagnosis not present

## 2020-05-13 ENCOUNTER — Ambulatory Visit: Payer: PPO | Admitting: Adult Health

## 2020-05-13 ENCOUNTER — Encounter: Payer: Self-pay | Admitting: Adult Health

## 2020-05-13 ENCOUNTER — Other Ambulatory Visit: Payer: Self-pay

## 2020-05-13 VITALS — BP 148/58 | HR 65 | Temp 97.8°F | Ht 62.0 in | Wt 189.0 lb

## 2020-05-13 DIAGNOSIS — Z23 Encounter for immunization: Secondary | ICD-10-CM

## 2020-05-13 DIAGNOSIS — R109 Unspecified abdominal pain: Secondary | ICD-10-CM

## 2020-05-13 NOTE — Patient Instructions (Addendum)
Gas X and/or Bentyl As needed  Abdominal cramps  Continue on Protonix and Pepcid  GERD diet  Call if happens again with abdominal pains.  Follow up with GI next month as planned.  Flu shot today  Follow up with Dr. Melvyn Novas  As planned and As needed   Please contact office for sooner follow up if symptoms do not improve or worsen or seek emergency care

## 2020-05-13 NOTE — Assessment & Plan Note (Signed)
Abdominal pain - ? Etiology may have been a mild gastritis/ diverculosis flare - symptoms totally resolved without treatment . Exam is unrevealing today . She has returned to normal diet  Advised to use gas x or bentyl As needed   If sx return contact our office   Plan  Patient Instructions  Gas X and/or Bentyl As needed  Abdominal cramps  Continue on Protonix and Pepcid  GERD diet  Call if happens again with abdominal pains.  Follow up with GI next month as planned.  Flu shot today  Follow up with Dr. Melvyn Novas  As planned and As needed   Please contact office for sooner follow up if symptoms do not improve or worsen or seek emergency care

## 2020-05-13 NOTE — Progress Notes (Signed)
@Patient  ID: [name], female    DOB: 1946/04/20, 74 y.o.   MRN: [id number]  Chief Complaint  Patient presents with  . Follow-up    Upset stomach     Referring provider: Tanda Rockers, MD  HPI: 74 yo female never smoker followed for Primary Care by Dr. Melvyn Novas  .  Medical history significant for morbid obesity , GERD , OSA , chronic rhinitis , and HTN   TEST/EVENTS :  Venous Dopplers October 2020 - for DVT bilaterally Chest x-ray June 2020 is clear  05/13/2020 Follow up : Stomach pain Patient presents for a work in visit . Complains of 1 week ago , developed lower abdominal pain across lower abdomen. Last for 4 days. No associated n/v/d., no constipation, no urinary symptoms, no back pain or bloody stool or urine. Pain has resolved and she is back to regular diet. She has appointment with GI for colonoscopy next month .  Appetite is good, no GERD . She is on Protonix and Pepcid .  Did recently start on Fosamax but says that pain did not start on day she took Fosamax.  Has physical and labs planned for next month as well.  Did not take any meds for pain.   Allergies  Allergen Reactions  . Epinephrine     REACTION: tachycardia    Immunization History  Administered Date(s) Administered  . Fluad Quad(high Dose 65+) 05/13/2020  . Influenza Split 05/24/2013, 05/10/2015, 05/23/2016  . Influenza Whole 06/24/2009, 05/24/2010, 05/25/2011, 05/24/2012, 05/17/2017, 04/24/2018  . Influenza, High Dose Seasonal PF 06/05/2019  . Influenza,inj,Quad PF,6+ Mos 06/07/2014  . Influenza-Unspecified 06/09/2017, 06/05/2019  . PFIZER SARS-COV-2 Vaccination 01/23/2020, 02/14/2020  . Pneumococcal Conjugate-13 04/27/2014  . Pneumococcal Polysaccharide-23 05/24/2008, 05/17/2012, 01/24/2019  . Td 05/23/2009  . Tdap 01/24/2019    Past Medical History:  Diagnosis Date  . Allergic rhinitis   . Allergy   . Anxiety   . Anxiety and depression   . Arthritis   . Asthma    years ago-1993-94  .  Barrett's esophagus 05/2005  . Blood transfusion without reported diagnosis    as baby  . Cancer ([place])    Basal cell face  . Cataract    bilateral removal  . Depression   . Diverticulosis   . Esophageal stricture   . Gastric polyps   . GERD (gastroesophageal reflux disease)   . Hiatal hernia   . Hypertension    on meds  . Morbid obesity ([place])   . OSA (obstructive sleep apnea)   . Osteoporosis   . Pneumonia    many years ago  . Positive PPD   . Sleep apnea    bi pap    Tobacco History: Social History   Tobacco Use  Smoking Status Never Smoker  Smokeless Tobacco Never Used  Tobacco Comment   pt has never smoked but husband smoked   Counseling given: Not Answered Comment: pt has never smoked but husband smoked   Outpatient Medications Prior to Visit  Medication Sig Dispense Refill  . acetaminophen (TYLENOL) 325 MG tablet Take 650 mg by mouth every 6 (six) hours as needed. For pain    . alendronate (FOSAMAX) 70 MG tablet Take 1 tablet (70 mg total) by mouth once a week. Take with a full glass of water on an empty stomach. 4 tablet 5  . ALPRAZolam (XANAX) 0.25 MG tablet Take 0.125 mg by mouth 3 (three) times daily as needed. For anxiety    . buPROPion (  WELLBUTRIN XL) 300 MG 24 hr tablet Take 300 mg by mouth daily.    . Calcium Carbonate-Vitamin D 600-400 MG-UNIT per tablet Take 1 tablet by mouth daily.      [name] dicyclomine (BENTYL) 10 MG capsule Take 1 capsule (10 mg total) by mouth 3 (three) times daily as needed for spasms. 90 capsule 11  . famotidine (PEPCID) 20 MG tablet Take 20 mg by mouth at bedtime as needed. For allergies    . fluticasone (FLONASE) 50 MCG/ACT nasal spray 2 SPRAYS IN EACH NOSTRIL ONCE A DAY. 16 g 8  . mirabegron ER (MYRBETRIQ) 25 MG TB24 tablet Take 1 tablet by mouth daily.    . Multiple Vitamin (MULTIVITAMIN WITH MINERALS) TABS tablet Take 1 tablet by mouth daily.    . pantoprazole (PROTONIX) 40 MG tablet KEEP APPT FOR FURTHER REFILLS 60 tablet 1    . tolterodine (DETROL LA) 4 MG 24 hr capsule Take 4 mg by mouth at bedtime.    . triamterene-hydrochlorothiazide (MAXZIDE-25) 37.5-25 MG tablet 1 daily as needed for swelling 30 tablet 5   No facility-administered medications prior to visit.     Review of Systems:   Constitutional:   No  weight loss, night sweats,  Fevers, chills, fatigue, or  lassitude.  HEENT:   No headaches,  Difficulty swallowing,  Tooth/dental problems, or  Sore throat,                No sneezing, itching, ear ache, nasal congestion, post nasal drip,   CV:  No chest pain,  Orthopnea, PND, swelling in lower extremities, anasarca, dizziness, palpitations, syncope.   GI  No  , bloody stools.   Resp: No shortness of breath with exertion or at rest.  No excess mucus, no productive cough,  No non-productive cough,  No coughing up of blood.  No change in color of mucus.  No wheezing.  No chest wall deformity  Skin: no rash or lesions.  GU: no dysuria, change in color of urine, no urgency or frequency.  No flank pain, no hematuria   MS:  No joint pain or swelling.  No decreased range of motion.  No back pain.    Physical Exam  BP (!) 148/58 (BP Location: Left Arm, Cuff Size: Normal)   Pulse 65   Temp 97.8 F (36.6 C) (Temporal)   Ht 5' 2"  (1.575 m)   Wt 189 lb (85.7 kg)   SpO2 98% Comment: RA  BMI 34.57 kg/m   GEN: A/Ox3; pleasant , NAD, well nourished    HEENT:  [place]/AT,   NOSE-clear, THROAT-clear, no lesions, no postnasal drip or exudate noted.   NECK:  Supple w/ fair ROM; no JVD; normal carotid impulses w/o bruits; no thyromegaly or nodules palpated; no lymphadenopathy.    RESP  Clear  P & A; w/o, wheezes/ rales/ or rhonchi. no accessory muscle use, no dullness to percussion  CARD:  RRR, no m/r/g, no peripheral edema, pulses intact, no cyanosis or clubbing.  GI:   Soft & nt; nml bowel sounds; no organomegaly or masses detected.  No guarding or rebound .   Musco: Warm bil, no deformities or joint  swelling noted.   Neuro: alert, no focal deficits noted.    Skin: Warm, no lesions or rashes    Lab Results:  CBC    Component Value Date/Time   WBC 8.4 12/13/2019 1005   RBC 4.41 12/13/2019 1005   HGB 13.1 12/13/2019 1005   HCT 40.3 12/13/2019 1005  PLT 354.0 12/13/2019 1005   MCV 91.2 12/13/2019 1005   MCH 29.8 10/28/2011 1335   MCHC 32.6 12/13/2019 1005   RDW 13.6 12/13/2019 1005   LYMPHSABS 2.4 12/13/2019 1005   MONOABS 0.3 12/13/2019 1005   EOSABS 0.1 12/13/2019 1005   BASOSABS 0.1 12/13/2019 1005    BMET    Component Value Date/Time   NA 142 12/13/2019 1005   K 3.7 12/13/2019 1005   CL 103 12/13/2019 1005   CO2 32 12/13/2019 1005   GLUCOSE 99 12/13/2019 1005   BUN 10 12/13/2019 1005   CREATININE 0.73 12/13/2019 1005   CALCIUM 9.7 12/13/2019 1005   GFRNONAA 93.07 06/03/2010 0906   GFRAA  11/03/2008 2125    >60        The eGFR has been calculated using the MDRD equation. This calculation has not been validated in all clinical situations. eGFR's persistently <60 mL/min signify possible Chronic Kidney Disease.    BNP No results found for: BNP  ProBNP    Component Value Date/Time   PROBNP 23.0 06/05/2019 1148    Imaging: No results found.    No flowsheet data found.  No results found for: NITRICOXIDE      Assessment & Plan:   Abdominal pain Abdominal pain - ? Etiology may have been a mild gastritis/ diverculosis flare - symptoms totally resolved without treatment . Exam is unrevealing today . She has returned to normal diet  Advised to use gas x or bentyl As needed   If sx return contact our office   Plan  Patient Instructions  Gas X and/or Bentyl As needed  Abdominal cramps  Continue on Protonix and Pepcid  GERD diet  Call if happens again with abdominal pains.  Follow up with GI next month as planned.  Flu shot today  Follow up with Dr. Melvyn Novas  As planned and As needed   Please contact office for sooner follow up if symptoms  do not improve or worsen or seek emergency care          Rexene Edison, NP 05/13/2020

## 2020-05-21 DIAGNOSIS — D485 Neoplasm of uncertain behavior of skin: Secondary | ICD-10-CM | POA: Diagnosis not present

## 2020-05-21 DIAGNOSIS — L570 Actinic keratosis: Secondary | ICD-10-CM | POA: Diagnosis not present

## 2020-05-29 ENCOUNTER — Other Ambulatory Visit: Payer: Self-pay | Admitting: Gastroenterology

## 2020-06-05 ENCOUNTER — Ambulatory Visit: Payer: PPO | Admitting: Gastroenterology

## 2020-06-07 ENCOUNTER — Encounter: Payer: PPO | Admitting: Internal Medicine

## 2020-06-14 ENCOUNTER — Ambulatory Visit: Payer: PPO | Admitting: Gastroenterology

## 2020-06-19 ENCOUNTER — Encounter: Payer: PPO | Admitting: Internal Medicine

## 2020-06-19 DIAGNOSIS — I8312 Varicose veins of left lower extremity with inflammation: Secondary | ICD-10-CM | POA: Diagnosis not present

## 2020-06-19 DIAGNOSIS — I83893 Varicose veins of bilateral lower extremities with other complications: Secondary | ICD-10-CM | POA: Diagnosis not present

## 2020-06-19 DIAGNOSIS — I8311 Varicose veins of right lower extremity with inflammation: Secondary | ICD-10-CM | POA: Diagnosis not present

## 2020-06-20 DIAGNOSIS — I8311 Varicose veins of right lower extremity with inflammation: Secondary | ICD-10-CM | POA: Diagnosis not present

## 2020-06-20 DIAGNOSIS — I8312 Varicose veins of left lower extremity with inflammation: Secondary | ICD-10-CM | POA: Diagnosis not present

## 2020-06-20 DIAGNOSIS — I83893 Varicose veins of bilateral lower extremities with other complications: Secondary | ICD-10-CM | POA: Diagnosis not present

## 2020-06-20 DIAGNOSIS — I83813 Varicose veins of bilateral lower extremities with pain: Secondary | ICD-10-CM | POA: Diagnosis not present

## 2020-07-17 ENCOUNTER — Encounter: Payer: PPO | Admitting: Internal Medicine

## 2020-07-31 DIAGNOSIS — I83811 Varicose veins of right lower extremities with pain: Secondary | ICD-10-CM | POA: Diagnosis not present

## 2020-07-31 DIAGNOSIS — I8311 Varicose veins of right lower extremity with inflammation: Secondary | ICD-10-CM | POA: Diagnosis not present

## 2020-07-31 DIAGNOSIS — I83891 Varicose veins of right lower extremities with other complications: Secondary | ICD-10-CM | POA: Diagnosis not present

## 2020-08-06 ENCOUNTER — Ambulatory Visit: Payer: PPO | Admitting: Gastroenterology

## 2020-08-07 DIAGNOSIS — I8312 Varicose veins of left lower extremity with inflammation: Secondary | ICD-10-CM | POA: Diagnosis not present

## 2020-08-14 DIAGNOSIS — M7981 Nontraumatic hematoma of soft tissue: Secondary | ICD-10-CM | POA: Diagnosis not present

## 2020-08-14 DIAGNOSIS — I83811 Varicose veins of right lower extremities with pain: Secondary | ICD-10-CM | POA: Diagnosis not present

## 2020-08-28 ENCOUNTER — Encounter: Payer: PPO | Admitting: Internal Medicine

## 2020-08-28 NOTE — Progress Notes (Deleted)
Subjective:   Patient ID: [name], female    DOB: [birth year]     MRN: [id number]   Brief patient profile:  11 yowf never smoker with morbid obesity complicated by gastroesophageal reflux disease, and also a history of obstructive sleep apnea and chronic rhinitis/ hbp.   History of Present Illness  04/27/2014 f/u ov/[name] re: obesity/ ohs/hbp/djd  Chief Complaint  Patient presents with  . Annual Exam    Pt fasting. Overall doing well and denies any co's today.  no reg exercise, very tired in pms  > working out issues of sleep with [name] but he has not reviewed download yet rec Please remember to go to the lab and x-ray department downstairs for your tests - we will call you with the results when they are available. Prevnar today  Dr [name] will be in touch re your bipap settings    05/20/2018  f/u ov/[name] re:  [name] using ppi bid ac but not pepcid / new hoarseness x 63m/ plus hbp/ MO Chief Complaint  Patient presents with  . Acute Visit    Hoarseness off and on for the past 4 months.   Dyspnea:  Not limited by breathing from desired activities  / no aerobic at all now  Cough:clearing throat more s pattern but  not noct/ notes more at hosp with voice use  Sleeping: cpap flat one pillow rec Add pepcid 20 mg at bedtime GERD diet  Please see patient coordinator before you leave today  to schedule ENT eval for hoarseness>  Saw [name] 06/07/18  dx chronic nasopharyngitis and dysphonia> improved with abx/ ns irrigation   08/02/2018  f/u ov/[name] re: hbp /gerd/ ha generalized  In pm's better p tylenol  Chief Complaint  Patient presents with  . Follow-up    Doing well overall. She has HA on days that it rains.   Dyspnea:  Not limited by breathing from desired activities  / busy helping take care of son with disabilities with new tension pattern HA  Cough: none now  Sleeping: bipap/ flat on one pillow  rec No change in medications - remember elastic hose    01/24/2019  f/u ov/[name]  re:  Cpx:  Hbp, edema, MO  Chief Complaint  Patient presents with  . Anxiety  Dyspnea:  Yardwork/no aerobics Cough: none  Sleeping: cpap per [name]  SABA use: none 02: none  Viibryd per [name] helping anxiety rec No change rx  06/05/19 NP eval for leg swelling ? Cellulitis  rec Venous dopplers > neg venous dopplers bilaterally 06/06/19  Legs elevated, low salt diet .  Please take Maxzide daily for 3 days , then As needed  Leg swelling  Doxycycline 100mg  Twice daily  For 1 week.  Warm compresses to lower right leg daily .  Keep legs clean and dry .        03/07/2020  f/u ov/[name] re:  Obesity/ s/p covid 19 vaccination  Chief Complaint  Patient presents with  . Follow-up    No complaints  Dyspnea:  Not limited by breathing from desired activities  / doing yardwork Cough: none Sleeping: fine on cpap/bed is flat/ several pillows SABA use: none 02: none  rec  To get the most out of exercise, you need to be continuously aware that you are short of breath, but never out of breath, for 30 minutes daily. As you improve, it will actually be easier for you to do the same amount of exercise  in  30  minutes so always push to the level where you are short of breath.         08/28/2020  CPX/[name] re: obesity / hbp/edema  No chief complaint on file.    Dyspnea:  *** Cough: *** Sleeping: *** SABA use: *** 02: ***   No obvious day to day or daytime variability or assoc excess/ purulent sputum or mucus plugs or hemoptysis or cp or chest tightness, subjective wheeze or overt sinus or hb symptoms.   *** without nocturnal  or early am exacerbation  of respiratory  c/o's or need for noct saba. Also denies any obvious fluctuation of symptoms with weather or environmental changes or other aggravating or alleviating factors except as outlined above   No unusual exposure hx or h/o childhood pna/ asthma or knowledge of premature birth.  Current Allergies, Complete Past Medical History, Past  Surgical History, Family History, and Social History were reviewed in [employer] record.  ROS  The following are not active complaints unless bolded Hoarseness, sore throat, dysphagia, dental problems, itching, sneezing,  nasal congestion or discharge of excess mucus or purulent secretions, ear ache,   fever, chills, sweats, unintended wt loss or wt gain, classically pleuritic or exertional cp,  orthopnea pnd or arm/hand swelling  or leg swelling, presyncope, palpitations, abdominal pain, anorexia, nausea, vomiting, diarrhea  or change in bowel habits or change in bladder habits, change in stools or change in urine, dysuria, hematuria,  rash, arthralgias, visual complaints, headache, numbness, weakness or ataxia or problems with walking or coordination,  change in mood or  memory.        No outpatient medications have been marked as taking for the 08/28/20 encounter (Appointment) with [name].                     Past Medical History:  HIATAL HERNIA (ICD-553.3) with GERD  - H/o Barretts esophagus  - EGD pos HH/ stricture 04/18/10 , neg barrett's  RHINITIS, CHRONIC (ICD-472.0) .................................................................... [name]  - Remote allergy testing = molds/dust only  - Sinus CT 09/06/08 mild thickening only  OBSTRUCTIVE SLEEP APNEA (ICD-327.23)..................................................[name]  --PSG 03/31/02 AHI 64  --Nocturnal BIPAP > titration done 05/16/2015  MORBID OBESITY (ICD-278.01)  - Target wt = 158 for BMI < 30  POSITIVE PPD (ICD-795.5)  DIVERTICULOSIS  Shingles R T5/6 03/2015  Depression / Anxiety ..............................................................[name] ..................................................................... [name]......................................................... [name]  - DT May 23, 2009,    01/24/2019  - Pneumovax  04/2013, Prevnar 04/27/2014, Pneumovax 01/24/2019  - CPX 08/28/2020  - GYN [name]        Family History:  Father - CAD, ? 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  Never smoker, but Husband smoked / passed 0000000 Illicit Drug Use - no  Patient does not get regular exercise.  No etoh          Objective:   Physical Exam  08/28/2020     *** 03/07/2020  185   01/24/2019   174     wt 224 May 23, 2009 > 226 September 10, 2009 >  229 November 05, 2009 > 232 June 03, 2010 > 224 05/25/2011 >230 01/11/12 > 03/04/2012  225 > 05/17/2012  219 >217 07/29/2012 > 219 09/06/2012 >223 03/16/2013 > 04/11/2013  225 > 05/22/2013  225 > 225 06/23/2013 >  02/27/2014 215 > 04/27/2014  215 >  04/24/2015  222>  12/24/2015   214 >  03/02/2016  214 > 07/20/2016  220 > 08/20/2016  219 >  12/24/2016   211 > 02/12/2017  210 > 06/28/2017   207  > 01/14/2018  187  > 05/20/2018  194  > 08/02/2018  196  >  01/24/2019   174       Vital signs reviewed  08/28/2020  - Note at rest 02 sats  ***% on ***   General appearance:    ***            mild venous stasis changes***                   Assessment & Plan:

## 2020-09-02 DIAGNOSIS — I8312 Varicose veins of left lower extremity with inflammation: Secondary | ICD-10-CM | POA: Diagnosis not present

## 2020-09-03 DIAGNOSIS — I8311 Varicose veins of right lower extremity with inflammation: Secondary | ICD-10-CM | POA: Diagnosis not present

## 2020-09-03 DIAGNOSIS — H9201 Otalgia, right ear: Secondary | ICD-10-CM | POA: Diagnosis not present

## 2020-09-17 ENCOUNTER — Other Ambulatory Visit: Payer: Self-pay

## 2020-09-17 ENCOUNTER — Encounter: Payer: Self-pay | Admitting: Gastroenterology

## 2020-09-17 ENCOUNTER — Ambulatory Visit: Payer: PPO | Admitting: Gastroenterology

## 2020-09-17 VITALS — BP 112/74 | HR 72 | Ht 62.0 in | Wt 190.0 lb

## 2020-09-17 DIAGNOSIS — K580 Irritable bowel syndrome with diarrhea: Secondary | ICD-10-CM

## 2020-09-17 DIAGNOSIS — K219 Gastro-esophageal reflux disease without esophagitis: Secondary | ICD-10-CM | POA: Diagnosis not present

## 2020-09-17 DIAGNOSIS — I8312 Varicose veins of left lower extremity with inflammation: Secondary | ICD-10-CM | POA: Diagnosis not present

## 2020-09-17 MED ORDER — PANTOPRAZOLE SODIUM 40 MG PO TBEC: DELAYED_RELEASE_TABLET | ORAL | 11 refills | Status: DC

## 2020-09-17 MED ORDER — DICYCLOMINE HCL 10 MG PO CAPS: ORAL_CAPSULE | ORAL | 11 refills | Status: DC

## 2020-09-17 NOTE — Patient Instructions (Signed)
We have sent the following medications to your pharmacy for you to pick up at your convenience: dicyclomine and pantoprazole.   Thank you for choosing me and [facility].  Malcolm T. Stark, Jr., MD., FACG   

## 2020-09-17 NOTE — Progress Notes (Signed)
    History of Present Illness: This is a 75 year old female returning for follow-up of IBS-D and GERD.  She notes multiple foods that lead to postprandial abdominal cramping and urgency.  Certain foods clearly cause symptoms such as high fat foods and she is able to tolerate multiple other foods without symptoms.  She has been using dicyclomine somewhat sparingly.  Her reflux symptoms are well controlled on pantoprazole she generally takes it once daily but when symptoms are active she goes back to twice daily.  She notes a persistent upper abdominal bulge at the midline aspect of her cholecystectomy incision.  Denies weight loss,constipation, change in stool caliber, melena, hematochezia, nausea, vomiting, dysphagia, chest pain.   Current Medications, Allergies, Past Medical History, Past Surgical History, Family History and Social History were reviewed in [employer] record.   Physical Exam: General: Well developed, well nourished, no acute distress Head: Normocephalic and atraumatic Eyes:  sclerae anicteric, EOMI Ears: Normal auditory acuity Mouth: Not examined, mask on during Covid-19 pandemic Lungs: Clear throughout to auscultation Heart: Regular rate and rhythm; no murmurs, rubs or bruits Abdomen: Soft, non tender and non distended. No masses, hepatosplenomegaly.  Well-healed right upper quadrant incision consistent with open cholecystectomy.  Ventral hernia vs diastasis recti. Normal Bowel sounds Rectal: Not done Musculoskeletal: Symmetrical with no gross deformities  Pulses:  Normal pulses noted Extremities: No clubbing, cyanosis, edema or deformities noted Neurological: Alert oriented x 4, grossly nonfocal Psychological:  Alert and cooperative. Normal mood and affect   Assessment and Recommendations:  1.  IBS-D.  Continue to avoid foods that trigger symptoms.  Encouraged her to take dicyclomine 10 mg p.o. 3 times daily +/- Imodium 1-2 p.o. 3 times daily  before any meal when she has concerns about developing post prandial symptoms. REV in 1 year.   2. GERD.  Follow antireflux measures.  Continue pantoprazole 40 mg twice daily and reduce to once daily when symptoms are under good control. EGD in 2011 showed a nonobstructing EGJ stricture, gastric fundic polyps, duodenitis and a small hiatal hernia.  REV in 1 year.   3.  Ventral hernia vs diastases recti. Offered surgical referral for further evaluation however she declines at this time.  4. CRC screening, average risk.  A 10-year interval screening colonoscopy is recommended in November 2025.

## 2020-10-08 ENCOUNTER — Ambulatory Visit: Payer: PPO | Admitting: Internal Medicine

## 2020-10-08 ENCOUNTER — Encounter: Payer: Self-pay | Admitting: Internal Medicine

## 2020-10-08 ENCOUNTER — Other Ambulatory Visit: Payer: Self-pay

## 2020-10-08 ENCOUNTER — Ambulatory Visit (INDEPENDENT_AMBULATORY_CARE_PROVIDER_SITE_OTHER): Payer: PPO

## 2020-10-08 ENCOUNTER — Encounter: Payer: Self-pay | Admitting: *Deleted

## 2020-10-08 VITALS — BP 126/72 | HR 66 | Temp 97.2°F | Ht 60.5 in | Wt 187.0 lb

## 2020-10-08 DIAGNOSIS — J310 Chronic rhinitis: Secondary | ICD-10-CM

## 2020-10-08 DIAGNOSIS — I10 Essential (primary) hypertension: Secondary | ICD-10-CM | POA: Diagnosis not present

## 2020-10-08 DIAGNOSIS — Z0000 Encounter for general adult medical examination without abnormal findings: Secondary | ICD-10-CM | POA: Diagnosis not present

## 2020-10-08 DIAGNOSIS — M810 Age-related osteoporosis without current pathological fracture: Secondary | ICD-10-CM | POA: Diagnosis not present

## 2020-10-08 DIAGNOSIS — E7800 Pure hypercholesterolemia, unspecified: Secondary | ICD-10-CM | POA: Diagnosis not present

## 2020-10-08 LAB — BASIC METABOLIC PANEL
BUN: 12 mg/dL (ref 6–23)
CO2: 32 mEq/L (ref 19–32)
Calcium: 9.6 mg/dL (ref 8.4–10.5)
Chloride: 102 mEq/L (ref 96–112)
Creatinine, Ser: 0.75 mg/dL (ref 0.40–1.20)
GFR: 79.36 mL/min (ref >60.00)
Glucose, Bld: 84 mg/dL (ref 70–99)
Potassium: 3.9 mEq/L (ref 3.5–5.1)
Sodium: 140 mEq/L (ref 135–145)

## 2020-10-08 LAB — CBC WITH DIFFERENTIAL/PLATELET
Basophils Absolute: 0.1 10*3/uL (ref 0.0–0.1)
Basophils Relative: 0.7 % (ref 0.0–3.0)
Eosinophils Absolute: 0.1 10*3/uL (ref 0.0–0.7)
Eosinophils Relative: 1.5 % (ref 0.0–5.0)
HCT: 39.9 % (ref 36.0–46.0)
Hemoglobin: 13.3 g/dL (ref 12.0–15.0)
Lymphocytes Relative: 34.4 % (ref 12.0–46.0)
Lymphs Abs: 2.9 10*3/uL (ref 0.7–4.0)
MCHC: 33.3 g/dL (ref 30.0–36.0)
MCV: 88.4 fl (ref 78.0–100.0)
Monocytes Absolute: 0.6 10*3/uL (ref 0.1–1.0)
Monocytes Relative: 7.6 % (ref 3.0–12.0)
Neutro Abs: 4.7 10*3/uL (ref 1.4–7.7)
Neutrophils Relative %: 55.8 % (ref 43.0–77.0)
Platelets: 349 10*3/uL (ref 150.0–400.0)
RBC: 4.51 Mil/uL (ref 3.87–5.11)
RDW: 14 % (ref 11.5–15.5)
WBC: 8.4 10*3/uL (ref 4.0–10.5)

## 2020-10-08 LAB — LIPID PANEL
Cholesterol: 193 mg/dL (ref 0–200)
HDL: 50.6 mg/dL (ref >39.00)
LDL Cholesterol: 113 mg/dL — ABNORMAL HIGH (ref 0–99)
NonHDL: 142.13
Total CHOL/HDL Ratio: 4
Triglycerides: 146 mg/dL (ref 0.0–149.0)
VLDL: 29.2 mg/dL (ref 0.0–40.0)

## 2020-10-08 LAB — HEPATIC FUNCTION PANEL
ALT: 18 U/L (ref 0–35)
AST: 18 U/L (ref 0–37)
Albumin: 4.3 g/dL (ref 3.5–5.2)
Alkaline Phosphatase: 51 U/L (ref 39–117)
Bilirubin, Direct: 0.1 mg/dL (ref 0.0–0.3)
Total Bilirubin: 0.4 mg/dL (ref 0.2–1.2)
Total Protein: 7.5 g/dL (ref 6.0–8.3)

## 2020-10-08 LAB — URINALYSIS
Bilirubin Urine: NEGATIVE
Hgb urine dipstick: NEGATIVE
Ketones, ur: NEGATIVE
Leukocytes,Ua: NEGATIVE
Nitrite: NEGATIVE
Specific Gravity, Urine: 1.015 (ref 1.000–1.030)
Total Protein, Urine: NEGATIVE
Urine Glucose: NEGATIVE
Urobilinogen, UA: 0.2 (ref 0.0–1.0)
pH: 7.5 (ref 5.0–8.0)

## 2020-10-08 LAB — TSH: TSH: 0.97 u[IU]/mL (ref 0.35–4.50)

## 2020-10-08 NOTE — Assessment & Plan Note (Signed)
dx 12/06/19   - Dexa 06/15/11  Spine  0.2  L Fem Neck -1.1,   R Fem Neck -1.8 -  07/05/14  R Fem neck =  -1.5     -   01/06/16   R Fem neck = - 1.5  -   12/06/19   R Fem          = 1.8 and R forearm - 3.4 >   -    01/01/20 rx fosfamax   Tolerating rx fine so far, cautioned to be on lookout for worse GERD symptoms

## 2020-10-08 NOTE — Assessment & Plan Note (Signed)
-   Remote allergy testing = molds/dust only  - Sinus CT 09/06/08 mild thickening only    No eos on peripheral smear, continue flonase/ H1 prn    I had an extended discussion with the patient and reviewed all relevant studies so total time was  >30 minutes with moderate level of MDM with multiple chronic problems addressed   Each maintenance medication was reviewed in detail including most importantly the difference between maintenance and prns and under what circumstances the prns are to be triggered using an action plan format that is not reflected in the computer generated alphabetically organized AVS.     Please see AVS for specific instructions unique to this visit that I personally wrote and verbalized to the the pt in detail and then reviewed with pt  by my nurse highlighting any  changes in therapy recommended at today's visit to their plan of care.

## 2020-10-08 NOTE — Assessment & Plan Note (Addendum)
Lab Results  Component Value Date   CREATININE 0.75 10/08/2020   CREATININE 0.73 12/13/2019   CREATININE 0.80 06/05/2019     Adequate control on present rx, reviewed in detail with pt > no change in rx needed  / needs to remember maxzide to keep swelling in legs and bp down as meets criteria for lvh on ekg today

## 2020-10-08 NOTE — Patient Instructions (Addendum)
I very strongly recommend you get the  pfizer vaccine booster as soon as possible based on your risk of dying from the virus  and the proven safety and benefit of these vaccines against even the delta and omicron variants.     Please remember to go to the lab and x-ray department   for your tests - we will call you with the results when they are available.  Please schedule a follow up visit in 12  months but call sooner if needed

## 2020-10-08 NOTE — Progress Notes (Signed)
Subjective:   Patient ID: [name], female    DOB: 12/11/1945     MRN: [id number]   Brief patient profile:  12 yowf never smoker with morbid obesity complicated by gastroesophageal reflux disease, and also a history of obstructive sleep apnea and chronic rhinitis/ hbp.   History of Present Illness  04/27/2014 f/u ov/Lauriann Milillo re: obesity/ ohs/hbp/djd  Chief Complaint  Patient presents with  . Annual Exam    Pt fasting. Overall doing well and denies any co's today.  no reg exercise, very tired in pms  > working out issues of sleep with [name] but he has not reviewed download yet rec Please remember to go to the lab and x-ray department downstairs for your tests - we will call you with the results when they are available. Prevnar today  Dr [name] will be in touch re your bipap settings    05/20/2018  f/u ov/Tekla Malachowski re:  [name] using ppi bid ac but not pepcid / new hoarseness x 21m/ plus hbp/ MO Chief Complaint  Patient presents with  . Acute Visit    Hoarseness off and on for the past 4 months.   Dyspnea:  Not limited by breathing from desired activities  / no aerobic at all now  Cough:clearing throat more s pattern but  not noct/ notes more at hosp with voice use  Sleeping: cpap flat one pillow rec Add pepcid 20 mg at bedtime GERD diet  Please see patient coordinator before you leave today  to schedule ENT eval for hoarseness>  Saw [name] 06/07/18  dx chronic nasopharyngitis and dysphonia> improved with abx/ ns irrigation   08/02/2018  f/u ov/Ifeoluwa Bartz re: hbp /gerd/ ha generalized  In pm's better p tylenol  Chief Complaint  Patient presents with  . Follow-up    Doing well overall. She has HA on days that it rains.   Dyspnea:  Not limited by breathing from desired activities  / busy helping take care of son with disabilities with new tension pattern HA  Cough: none now  Sleeping: bipap/ flat on one pillow  rec No change in medications - remember elastic hose    01/24/2019  f/u ov/Lounell Schumacher  re:  Cpx:  Hbp, edema, MO  Chief Complaint  Patient presents with  . Anxiety  Dyspnea:  Yardwork/no aerobics Cough: none  Sleeping: cpap per sood  SABA use: none 02: none  Viibryd per [name] helping anxiety rec No change rx  06/05/19 NP eval for leg swelling ? Cellulitis  rec Venous dopplers > neg venous dopplers bilaterally 06/06/19  Legs elevated, low salt diet .  Please take Maxzide daily for 3 days , then As needed  Leg swelling  Doxycycline 100mg  Twice daily  For 1 week.  Warm compresses to lower right leg daily .  Keep legs clean and dry .        03/07/2020  f/u ov/Jaycub Noorani re:  Obesity/ s/p covid 19 vaccination  Chief Complaint  Patient presents with  . Follow-up    No complaints  Dyspnea:  Not limited by breathing from desired activities  / doing yardwork Cough: none Sleeping: fine on cpap/bed is flat/ several pillows SABA use: none 02: none  rec Neg cal bal   10/08/2020  f/u ov/Reyanne Hussar re: obesity  / hbp / leg swelling  Chief Complaint  Patient presents with  . Annual Exam    Pt is fasting. Doing well today and no new co's.   Dyspnea:  No regular walking/ aerobics/  Steps one - two floors ok  Cough: none Sleeping: on bipap/ bed is flat,one pillow  SABA use: none 02: none Covid status:   Vaccinated x 2, last 01/2020 not boosted    No obvious day to day or daytime variability or assoc excess/ purulent sputum or mucus plugs or hemoptysis or cp or chest tightness, subjective wheeze or overt sinus or hb symptoms.   Sleeping  without nocturnal  or early am exacerbation  of respiratory  c/o's or need for noct saba. Also denies any obvious fluctuation of symptoms with weather or environmental changes or other aggravating or alleviating factors except as outlined above   No unusual exposure hx or h/o childhood pna/ asthma or knowledge of premature birth.  Current Allergies, Complete Past Medical History, Past Surgical History, Family History, and Social History were  reviewed in [employer] record.  ROS  The following are not active complaints unless bolded Hoarseness, sore throat, dysphagia, dental problems, itching, sneezing,  nasal congestion or discharge of excess mucus or purulent secretions, ear ache,   fever, chills, sweats, unintended wt loss or wt gain, classically pleuritic or exertional cp,  orthopnea pnd or arm/hand swelling  or leg swelling, presyncope, palpitations, abdominal pain, anorexia, nausea, vomiting, diarrhea  or change in bowel habits or change in bladder habits, change in stools or change in urine, dysuria, hematuria,  rash, arthralgias, visual complaints, headache, numbness, weakness or ataxia or problems with walking or coordination,  change in mood or  memory.        Current Meds  Medication Sig  . acetaminophen (TYLENOL) 325 MG tablet Take 650 mg by mouth every 6 (six) hours as needed. For pain  . alendronate (FOSAMAX) 70 MG tablet Take 1 tablet (70 mg total) by mouth once a week. Take with a full glass of water on an empty stomach.  . ALPRAZolam (XANAX) 0.25 MG tablet Take 0.125 mg by mouth 3 (three) times daily as needed. For anxiety  . buPROPion (WELLBUTRIN XL) 300 MG 24 hr tablet Take 300 mg by mouth daily.  . Calcium Carbonate-Vitamin D 600-400 MG-UNIT per tablet Take 1 tablet by mouth daily.  [name] dicyclomine (BENTYL) 10 MG capsule TAKE 1 CAPSULE BY MOUTH 3 TIMES DAILY AS NEEDED FOR SPASMS.  . famotidine (PEPCID) 20 MG tablet Take 20 mg by mouth at bedtime as needed. For allergies  . fluticasone (FLONASE) 50 MCG/ACT nasal spray 2 SPRAYS IN EACH NOSTRIL ONCE A DAY.  . mirabegron ER (MYRBETRIQ) 25 MG TB24 tablet Take 1 tablet by mouth daily.  . Multiple Vitamin (MULTIVITAMIN WITH MINERALS) TABS tablet Take 1 tablet by mouth daily.  . pantoprazole (PROTONIX) 40 MG tablet One tablet by mouth   daily before  dinner  . tolterodine (DETROL LA) 4 MG 24 hr capsule Take 4 mg by mouth at bedtime.  .  triamterene-hydrochlorothiazide (MAXZIDE-25) 37.5-25 MG tablet 1 daily as needed for swelling                       Past Medical History:  HIATAL HERNIA (ICD-553.3) with GERD  - H/o Barretts esophagus  - EGD pos HH/ stricture 04/18/10 , neg barrett's  RHINITIS, CHRONIC (ICD-472.0) .................................................................... [name]  - Remote allergy testing = molds/dust only  - Sinus CT 09/06/08 mild thickening only  OBSTRUCTIVE SLEEP APNEA (ICD-327.23)..................................................[name]  --PSG 03/31/02 AHI 64  --Nocturnal BIPAP > titration done 05/16/2015  MORBID OBESITY (ICD-278.01)  - Target wt = 158 for BMI <  [place] PPD (EXB-284.5)  DIVERTICULOSIS  Shingles R T5/6 03/2015  Depression / Anxiety ..............................................................[name] Karr Stasis dermatitis ..................................................................... [facility] [employer]........................................................Marland KitchenWert  - DT May 23, 2009,    01/24/2019  - Pneumovax 04/2013, Prevnar 04/27/2014, Pneumovax 01/24/2019  - 10/08/2020   - GYN  Laurin Coder NP        Family History:  Father - CAD, ? 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  Never smoker, but Husband smoked / passed 1324 Illicit Drug Use - no  Patient does not get regular exercise.  No etoh          Objective:   Physical Exam   10/08/2020    187 03/07/2020    185   01/24/2019     174     08/02/2018  196  9 /30 /2010  224   Vital signs reviewed  10/08/2020  - Note at rest 02 sats  99% on RA   General appearance:  Mod obese  pleasant wf nad   HEENT : pt wearing mask not removed for exam due to covid -19 concerns.    NECK :  without JVD/Nodes/TM/ nl carotid upstrokes bilaterally   LUNGS: no acc muscle use, Mild/mod kyphotic contour chest which is clear to A and P bilaterally without cough on insp or exp maneuvers   CV:  RRR  no s3 or murmur or increase in P2, and 1+ pitting both LE's  ABD:  soft and nontender with nl inspiratory excursion in the supine position. No bruits or organomegaly appreciated, bowel sounds nl  MS:  Nl gait/ ext warm without deformities, calf tenderness, cyanosis or clubbing No obvious joint restrictions   SKIN: warm and dry with mild venous stasis changes both LEs   NEURO:  alert, approp, nl sensorium wi        CXR PA and Lateral:   10/08/2020 :    I personally reviewed images and  impression as follows:   Mod kyphosis, mild cm s chf        Labs ordered/ reviewed:      Chemistry      Component Value Date/Time   NA 140 10/08/2020 0923   K 3.9 10/08/2020 0923   CL 102 10/08/2020 0923   CO2 32 10/08/2020 0923   BUN 12 10/08/2020 0923   CREATININE 0.75 10/08/2020 0923      Component Value Date/Time   CALCIUM 9.6 10/08/2020 0923   ALKPHOS 51 10/08/2020 0923   AST 18 10/08/2020 0923   ALT 18 10/08/2020 0923   BILITOT 0.4 10/08/2020 0923        Lab Results  Component Value Date   WBC 8.4 10/08/2020   HGB 13.3 10/08/2020   HCT 39.9 10/08/2020   MCV 88.4 10/08/2020   PLT 349.0 10/08/2020        EOS  0.1                                    10/08/2020       Lab Results  Component Value Date   TSH 0.97 10/08/2020      ekg 10/08/2020   NSR/  LVH criteria for avl  - no ischemic changes       U/a 10/08/2020   Neg             Assessment & Plan:

## 2020-10-08 NOTE — Assessment & Plan Note (Addendum)
-   Ideal LDL < 130 with hbp and ? Pos fm hx  LVH on ekg but good ex tol s cp or sob > no change rx  Lab Results  Component Value Date   CHOL 193 10/08/2020   HDL 50.60 10/08/2020   LDLCALC 113 (H) 10/08/2020   LDLDIRECT 140.4 05/22/2013   TRIG 146.0 10/08/2020   CHOLHDL 4 10/08/2020     Adequate control on present rx, reviewed in detail with pt > no change in rx needed  = diet/ ex

## 2020-10-08 NOTE — Assessment & Plan Note (Signed)
-   GI screening [name] - [place] NP  - Mammos neg 10/05/13  And 09/02/17 and 09/30/18 and 12/06/19   She is vaccinate but not boosted but due to age and obesity rec 3rd vaccine asap.

## 2020-10-09 ENCOUNTER — Encounter: Payer: Self-pay | Admitting: *Deleted

## 2020-11-19 ENCOUNTER — Telehealth: Payer: Self-pay | Admitting: Internal Medicine

## 2020-11-19 NOTE — Telephone Encounter (Signed)
Called and spoke with pt to get more info on what she was talking about with "brain nutrients" that she has seen advertised.  Pt stated that she thought it was called Jethro Bolus but was unsure of the spelling. Pt said that this has been shown to be recommended for elderly to help with their memory as they get older and she wants to know if MW would be okay with her trying this.  Dr. Melvyn Novas, please advise.

## 2020-11-19 NOTE — Telephone Encounter (Signed)
As long as not prescription drug fine with me.  If she's concerned about memory though she should be seeing neuro and following their recs:  Ok to refer if not seeing one yet

## 2020-11-19 NOTE — Telephone Encounter (Signed)
Called and spoke with patient. She verbalized understanding of MW's recs. I offered to place a referral to neurology for memory but she stated that she wanted to take some more to do some research on the medication. She will call us back if she decides to go with the neurology referral.   Nothing further needed at time of call.

## 2020-12-05 ENCOUNTER — Telehealth: Payer: Self-pay | Admitting: Internal Medicine

## 2020-12-05 NOTE — Telephone Encounter (Signed)
Called and spoke with pt who states she has been having pain at her coccyx. States that she broke her coccyx years ago when she used to ride horses and at that time she did go to see an orthopedist at [facility].  Pt said that she has had pain now for at least 2 months and it is gradually just getting worse. Asked pt if she tried to call [facility] due to her having the pain and she said that she did but they told her that they were not currently seeing any pts that were having pain at the coccyx.  Pt is wanting recommendations to help with this. Dr. Melvyn Novas is pt's PCP but due to him being out of the office today, sending to provider of the day. Tammy, please advise.

## 2020-12-05 NOTE — Telephone Encounter (Signed)
I called and spoke with patient regarding TP recs. She has been taking Tylenol for years for this issue and takes Advil sparingly as she cannot take IBU. She has not been using heat and will try this. Patient has been scheduled with Dr. Melvyn Novas on 01/03/2021. Patient verbalized understanding and told to call with any other questions/concerns. Nothing further needed.

## 2020-12-05 NOTE — Telephone Encounter (Signed)
Started here that she is having pain for the last 2 months.  Has she tried Tylenol, heat or Advil?  Can set up with the next available appointment with an app or Dr. Melvyn Novas for evaluation  Please contact office for sooner follow up if symptoms do not improve or worsen or seek emergency care

## 2020-12-19 DIAGNOSIS — Z20828 Contact with and (suspected) exposure to other viral communicable diseases: Secondary | ICD-10-CM | POA: Diagnosis not present

## 2020-12-19 DIAGNOSIS — R0981 Nasal congestion: Secondary | ICD-10-CM | POA: Diagnosis not present

## 2020-12-19 DIAGNOSIS — R509 Fever, unspecified: Secondary | ICD-10-CM | POA: Diagnosis not present

## 2020-12-19 DIAGNOSIS — R519 Headache, unspecified: Secondary | ICD-10-CM | POA: Diagnosis not present

## 2020-12-28 DIAGNOSIS — R051 Acute cough: Secondary | ICD-10-CM | POA: Diagnosis not present

## 2020-12-28 DIAGNOSIS — U071 COVID-19: Secondary | ICD-10-CM | POA: Diagnosis not present

## 2020-12-28 DIAGNOSIS — J0100 Acute maxillary sinusitis, unspecified: Secondary | ICD-10-CM | POA: Diagnosis not present

## 2021-01-01 ENCOUNTER — Ambulatory Visit: Payer: PPO | Admitting: Internal Medicine

## 2021-01-09 DIAGNOSIS — I8311 Varicose veins of right lower extremity with inflammation: Secondary | ICD-10-CM | POA: Diagnosis not present

## 2021-01-09 DIAGNOSIS — D2122 Benign neoplasm of connective and other soft tissue of left lower limb, including hip: Secondary | ICD-10-CM | POA: Diagnosis not present

## 2021-01-09 DIAGNOSIS — I8312 Varicose veins of left lower extremity with inflammation: Secondary | ICD-10-CM | POA: Diagnosis not present

## 2021-01-24 ENCOUNTER — Other Ambulatory Visit: Payer: Self-pay | Admitting: Internal Medicine

## 2021-01-24 ENCOUNTER — Other Ambulatory Visit: Payer: Self-pay | Admitting: Adult Health

## 2021-01-24 DIAGNOSIS — Z1231 Encounter for screening mammogram for malignant neoplasm of breast: Secondary | ICD-10-CM | POA: Diagnosis not present

## 2021-01-31 ENCOUNTER — Ambulatory Visit: Payer: PPO | Admitting: Physician Assistant

## 2021-02-11 DIAGNOSIS — H26491 Other secondary cataract, right eye: Secondary | ICD-10-CM | POA: Diagnosis not present

## 2021-02-12 DIAGNOSIS — I8312 Varicose veins of left lower extremity with inflammation: Secondary | ICD-10-CM | POA: Diagnosis not present

## 2021-02-18 DIAGNOSIS — M1612 Unilateral primary osteoarthritis, left hip: Secondary | ICD-10-CM | POA: Diagnosis not present

## 2021-02-18 DIAGNOSIS — H26 Other cataract: Secondary | ICD-10-CM | POA: Diagnosis not present

## 2021-02-26 DIAGNOSIS — N3944 Nocturnal enuresis: Secondary | ICD-10-CM | POA: Diagnosis not present

## 2021-02-26 DIAGNOSIS — R8271 Bacteriuria: Secondary | ICD-10-CM | POA: Diagnosis not present

## 2021-02-26 DIAGNOSIS — R350 Frequency of micturition: Secondary | ICD-10-CM | POA: Diagnosis not present

## 2021-03-10 ENCOUNTER — Ambulatory Visit: Payer: PPO | Admitting: Pulmonary Disease

## 2021-03-13 ENCOUNTER — Ambulatory Visit: Payer: PPO | Admitting: Physician Assistant

## 2021-03-18 ENCOUNTER — Telehealth: Payer: Self-pay | Admitting: Internal Medicine

## 2021-03-18 NOTE — Telephone Encounter (Signed)
Called and spoke with pt and she stated that she was seen at [facility] for treatment of her varicose veins.  She stated that they did an [place] and this showed something on her femur- up around the head of her femur--they are not sure what is going on with this but they stated that she may need to have MRI of her femur.   Dr. Eliot Ford is going to send over the information to MW to make him aware of what is going on.  Pt stated that she wanted to wait to schedule the appt with MW until he gets this information.

## 2021-03-18 NOTE — Telephone Encounter (Signed)
Pt is returning phone call in regards to MRI. Pls regard; [phone]

## 2021-03-18 NOTE — Telephone Encounter (Signed)
I have called and LM on VM for pt to call us back.  

## 2021-03-19 NOTE — Telephone Encounter (Signed)
Spoke with pt and reviewed Dr. Gustavus Bryant recommendations. Pt stated she was already established with ortho and would follow up with them. Nothing further needed at this time.

## 2021-03-19 NOTE — Telephone Encounter (Signed)
I am happy to look at report but if they are saying she needs MRI of femur that would have to be eval by ortho first - send to [facility] if she doesn't have one

## 2021-03-26 DIAGNOSIS — M1612 Unilateral primary osteoarthritis, left hip: Secondary | ICD-10-CM | POA: Diagnosis not present

## 2021-03-26 DIAGNOSIS — M25552 Pain in left hip: Secondary | ICD-10-CM | POA: Diagnosis not present

## 2021-03-31 DIAGNOSIS — D0439 Carcinoma in situ of skin of other parts of face: Secondary | ICD-10-CM | POA: Diagnosis not present

## 2021-03-31 DIAGNOSIS — L570 Actinic keratosis: Secondary | ICD-10-CM | POA: Diagnosis not present

## 2021-04-10 DIAGNOSIS — M6208 Separation of muscle (nontraumatic), other site: Secondary | ICD-10-CM | POA: Diagnosis not present

## 2021-04-21 ENCOUNTER — Encounter: Payer: Self-pay | Admitting: Physician Assistant

## 2021-04-21 ENCOUNTER — Ambulatory Visit: Payer: PPO | Admitting: Physician Assistant

## 2021-04-21 ENCOUNTER — Other Ambulatory Visit (INDEPENDENT_AMBULATORY_CARE_PROVIDER_SITE_OTHER): Payer: PPO

## 2021-04-21 VITALS — BP 170/70 | HR 89 | Ht 62.0 in | Wt 187.0 lb

## 2021-04-21 DIAGNOSIS — R110 Nausea: Secondary | ICD-10-CM

## 2021-04-21 DIAGNOSIS — R1013 Epigastric pain: Secondary | ICD-10-CM

## 2021-04-21 DIAGNOSIS — R1084 Generalized abdominal pain: Secondary | ICD-10-CM

## 2021-04-21 LAB — CBC WITH DIFFERENTIAL/PLATELET
Basophils Absolute: 0.1 10*3/uL (ref 0.0–0.1)
Basophils Relative: 1 % (ref 0.0–3.0)
Eosinophils Absolute: 0.1 10*3/uL (ref 0.0–0.7)
Eosinophils Relative: 1.6 % (ref 0.0–5.0)
HCT: 38.5 % (ref 36.0–46.0)
Hemoglobin: 12.9 g/dL (ref 12.0–15.0)
Lymphocytes Relative: 29.1 % (ref 12.0–46.0)
Lymphs Abs: 2.4 10*3/uL (ref 0.7–4.0)
MCHC: 33.6 g/dL (ref 30.0–36.0)
MCV: 90.9 fl (ref 78.0–100.0)
Monocytes Absolute: 0.7 10*3/uL (ref 0.1–1.0)
Monocytes Relative: 8.6 % (ref 3.0–12.0)
Neutro Abs: 5 10*3/uL (ref 1.4–7.7)
Neutrophils Relative %: 59.7 % (ref 43.0–77.0)
Platelets: 355 10*3/uL (ref 150.0–400.0)
RBC: 4.24 Mil/uL (ref 3.87–5.11)
RDW: 14.5 % (ref 11.5–15.5)
WBC: 8.4 10*3/uL (ref 4.0–10.5)

## 2021-04-21 LAB — COMPREHENSIVE METABOLIC PANEL
ALT: 12 U/L (ref 0–35)
AST: 13 U/L (ref 0–37)
Albumin: 4.1 g/dL (ref 3.5–5.2)
Alkaline Phosphatase: 60 U/L (ref 39–117)
BUN: 13 mg/dL (ref 6–23)
CO2: 29 mEq/L (ref 19–32)
Calcium: 9.5 mg/dL (ref 8.4–10.5)
Chloride: 105 mEq/L (ref 96–112)
Creatinine, Ser: 0.73 mg/dL (ref 0.40–1.20)
GFR: 81.67 mL/min (ref >60.00)
Glucose, Bld: 76 mg/dL (ref 70–99)
Potassium: 3.9 mEq/L (ref 3.5–5.1)
Sodium: 143 mEq/L (ref 135–145)
Total Bilirubin: 0.3 mg/dL (ref 0.2–1.2)
Total Protein: 7 g/dL (ref 6.0–8.3)

## 2021-04-21 NOTE — Patient Instructions (Addendum)
If you are age 73 or older, your body mass index should be between 23-30. Your Body mass index is 34.2 kg/m. If this is out of the aforementioned range listed, please consider follow up with your Primary Care Provider. __________________________________________________________  The [facility] providers would like to encourage you to use [facility] to communicate with providers for non-urgent requests or questions.  Due to long hold times on the telephone, sending your provider a message by [facility] may be a faster and more efficient way to get a response.  Please allow 48 business hours for a response.  Please remember that this is for non-urgent requests.   Your provider has requested that you go to the basement level for lab work before leaving today. Press "B" on the elevator. The lab is located at the first door on the left as you exit the elevator.  You have been scheduled for a CT scan of the abdomen and pelvis at [facility] ([address]---this is in the same building as [employer]).   You are scheduled on 04/23/2021 at 11:30 am. You should arrive 15 minutes prior to your appointment time for registration. Please follow the written instructions below on the day of your exam:  WARNING: IF YOU ARE ALLERGIC TO IODINE/X-RAY DYE, PLEASE NOTIFY RADIOLOGY IMMEDIATELY AT [phone]! YOU WILL BE GIVEN A 13 HOUR PREMEDICATION PREP.  1) Do not eat anything after 7:30 am (4 hours prior to your test). Please drink plenty of water. 2) You have been given 2 bottles of oral contrast to drink. The solution may taste better if refrigerated, but do NOT add ice or any other liquid to this solution. Shake well before drinking.    Drink 1 bottle of contrast @ 9:30 am (2 hours prior to your exam)  Drink 1 bottle of contrast @ 10:30 am (1 hour prior to your exam)  You may take any medications as prescribed with a small amount of water, if necessary. If you take any of the following  medications: METFORMIN, GLUCOPHAGE, GLUCOVANCE, AVANDAMET, RIOMET, FORTAMET, [place] MET, JANUMET, GLUMETZA or METAGLIP, you MAY be asked to HOLD this medication 48 hours AFTER the exam.  The purpose of you drinking the oral contrast is to aid in the visualization of your intestinal tract. The contrast solution may cause some diarrhea. Depending on your individual set of symptoms, you may also receive an intravenous injection of x-ray contrast/dye. Plan on being at [facility] for 30 minutes or longer, depending on the type of exam you are having performed.  This test typically takes 30-45 minutes to complete.  If you have any questions regarding your exam or if you need to reschedule, you may call the CT department at [phone] between the hours of 8:00 am and 5:00 pm, Monday-Friday.  ____________________________________________________________  Continue Dicyclomine 10 mg three times daily before meals and Pantoprazole 40 mg 1 tablet twice daily.  Follow up pending the results of your CT.  Thank you for entrusting me with your care and choosing [facility].  Amy Esterwood, PA-C

## 2021-04-21 NOTE — Progress Notes (Addendum)
Subjective:    Patient ID: [name], female    DOB: 01/12/1946, 75 y.o.   MRN: [id number]  HPI [name] is a pleasant 75 year old white female, established with Dr. Fuller Plan.  She comes in today with complaints of epigastric pain which has been present over the past month or so. She was last seen in the office in January 2022, she has been followed for IBS-D, GERD and is status post cholecystectomy as well as ERCP with removal of common bile duct stone in 2019 Also has history of diverticular disease , IBS,and last underwent colonoscopy in 2015 with finding of mild left-sided diverticulosis, no polyps and is indicated for 10-year interval follow-up.  Patient says that she has continued on Protonix 40 mg twice daily with good control of her reflux symptoms.  4 to 5 weeks ago she had onset of what she describes as a dull fairly constant pain in the upper abdomen which seems to be present no matter what she eats.  She says she has started eating very bland but symptoms have persisted.  She has not started any new medications, no aspirin or NSAIDs.  Pain is generally aggravated postprandially.  She denies any radiation of pain into her back or chest.  She has had some mild nausea, no vomiting.  Appetite has been okay weight overall stable.  She has been using dicyclomine more regularly which is somewhat helpful but the pain is persisting.  She has not had any similar symptoms in the past.  Review of Systems Pertinent positive and negative review of systems were noted in the above HPI section.  All other review of systems was otherwise negative.   Outpatient Encounter Medications as of 04/21/2021  Medication Sig   acetaminophen (TYLENOL) 325 MG tablet Take 650 mg by mouth every 6 (six) hours as needed. For pain   alendronate (FOSAMAX) 70 MG tablet TAKE 1 TABLET BY MOUTH ONCE A WEEK. TAKE WITH A FULL GLASS OF WATER ON AN EMPTY STOMACH.   ALPRAZolam (XANAX) 0.25 MG tablet Take 0.125 mg by mouth 3 (three)  times daily as needed. For anxiety   buPROPion (WELLBUTRIN XL) 300 MG 24 hr tablet Take 300 mg by mouth daily.   Calcium Carbonate-Vitamin D 600-400 MG-UNIT per tablet Take 1 tablet by mouth daily.   dicyclomine (BENTYL) 10 MG capsule TAKE 1 CAPSULE BY MOUTH 3 TIMES DAILY AS NEEDED FOR SPASMS.   famotidine (PEPCID) 20 MG tablet Take 20 mg by mouth at bedtime as needed. For allergies   fluticasone (FLONASE) 50 MCG/ACT nasal spray 2 SPRAYS IN EACH NOSTRIL ONCE A DAY.   mirabegron ER (MYRBETRIQ) 25 MG TB24 tablet Take 1 tablet by mouth daily.   Multiple Vitamin (MULTIVITAMIN WITH MINERALS) TABS tablet Take 1 tablet by mouth daily.   pantoprazole (PROTONIX) 40 MG tablet One tablet by mouth twice daily before breakfast and dinner   tolterodine (DETROL LA) 4 MG 24 hr capsule Take 4 mg by mouth at bedtime.   triamterene-hydrochlorothiazide (MAXZIDE-25) 37.5-25 MG tablet 1 daily as needed for swelling   No facility-administered encounter medications on file as of 04/21/2021.   Allergies  Allergen Reactions   Epinephrine     REACTION: tachycardia   Patient Active Problem List   Diagnosis Date Noted   Osteoporosis 01/01/2020   Abnormal bruising 12/13/2019   Viral illness 07/27/2019   Nonspecific syndrome suggestive of viral illness 07/17/2019   Cellulitis 06/05/2019   Chronic tension headache 08/03/2018   Chronic hoarseness 05/20/2018  Biliary pain    Common bile duct stone    Elevated LFTs 09/14/2017   Epigastric pain 09/13/2017   Memory changes 03/25/2017   Venous stasis dermatitis of both lower extremities 08/20/2016   Chest pain 07/20/2016   Leg swelling 03/29/2016   Abdominal pain 03/02/2016   Shingles 04/24/2015   Special screening for malignant neoplasms, colon 12/20/2013   Health care maintenance 05/22/2013   Hypokalemia 04/12/2013   Depression 04/12/2013   Other malaise and fatigue 03/16/2013   Hip pain 07/29/2012   Neck pain 05/17/2012   Essential hypertension 01/15/2012    Osteopenia 05/25/2011   Tick bite of neck 04/07/2011   Vitamin D deficiency 06/03/2010   HYPERCHOLESTEROLEMIA 08/03/2008   DIVERTICULOSIS-COLON 02/01/2008   RHINITIS, CHRONIC 01/31/2008   Morbid obesity due to excess calories ([place]) complicated by hbp/ djd  11/03/2007   Obstructive sleep apnea 11/03/2007   GERD 11/03/2007   POSITIVE PPD 11/03/2007   Social History   Socioeconomic History   Marital status: Widowed    Spouse name: Not on file   Number of children: 2   Years of education: Not on file   Highest education level: Not on file  Occupational History   Occupation: Writer: OTHER    Comment: [employer] in [place] Use   Smoking status: Never   Smokeless tobacco: Never   Tobacco comments:    pt has never smoked but husband smoked  Vaping Use   Vaping Use: Never used  Substance and Sexual Activity   Alcohol use: No   Drug use: No   Sexual activity: Not Currently  Other Topics Concern   Not on file  Social History Narrative   She lives alone (widowed).   She works as Facilities manager.   Highest level of education:  Some college   Social Determinants of Radio broadcast assistant Strain: Not on file  Food Insecurity: Not on file  Transportation Needs: Not on file  Physical Activity: Not on file  Stress: Not on file  Social Connections: Not on file  Intimate Partner Violence: Not on file    [name]'s family history includes Allergies in her mother; Alzheimer's disease in her mother; Colon cancer in her maternal uncle; Coronary artery disease in her father; Diabetes in her son; Heart attack in her paternal grandmother; Heart failure in her father and mother; Hypertension in her brother; Lung cancer in her paternal grandfather; Sleep apnea in her brother and son.      Objective:    Vitals:   04/21/21 1003  BP: (!) 170/70  Pulse: 89    Physical Exam Well-developed well-nourished older white female in no acute  distress.  Height, Weight, 187 BMI 34.2  HEENT; nontraumatic normocephalic, EOMI, PE R LA, sclera anicteric. Oropharynx; not examined today Neck; supple, no JVD Cardiovascular; regular rate and rhythm with S1-S2, no murmur rub or gallop Pulmonary; Clear bilaterally Abdomen; soft, she is tender across the epigastrium, no guarding or rebound ,nondistended, no palpable mass or hepatosplenomegaly, bowel sounds are active, cholecystectomy scar Rectal; not done today Skin; benign exam, no jaundice rash or appreciable lesions Extremities; no clubbing cyanosis or edema skin warm and dry Neuro/Psych; alert and oriented x4, grossly nonfocal mood and affect appropriate        Assessment & Plan:   #83 75 year old white female with 4 to 5-week history of dull fairly constant upper abdominal/epigastric pain worse postprandially.  This is in the setting of chronic  twice daily PPI therapy for chronic GERD. No aspirin or NSAID use, no new meds. Patient is status postcholecystectomy and has history of choledocholithiasis. Etiology of symptoms is not entirely clear, could be low risk for peptic ulcer disease or gastropathy.  Rule out recurrent choledocholithiasis, rule out other intra-abdominal inflammatory process, neoplasm.  #2 colon cancer screening-up-to-date with negative colonoscopy 2015 with the exception of mild diverticulosis indicated for 10-year interval follow-up #3 status post cholecystectomy, remotely- ERCP and CBD stone extraction 2019 #4 sleep apnea 5.  Chronic GERD-stable on twice daily PPI 6.  Hypertension 7.  Obesity #8 IBS  Plan; CBC with differential, c-Met, Patient will be scheduled for CT scan of the abdomen and pelvis with contrast. Continue Protonix 40 mg p.o. twice daily AC Okay to continue dicyclomine 10 mg 3 times daily as needed If CT is unrevealing, will need EGD scheduled with Dr. Fuller Plan.  [name] Genia Harold PA-C 04/21/2021   Cc: Tanda Rockers, MD

## 2021-04-22 ENCOUNTER — Ambulatory Visit: Payer: PPO | Admitting: Pulmonary Disease

## 2021-04-22 NOTE — Progress Notes (Signed)
Reviewed and agree with management plan.  Angele Wiemann T. Joan Avetisyan, MD FACG 

## 2021-04-23 ENCOUNTER — Inpatient Hospital Stay: Admission: RE | Admit: 2021-04-23 | Payer: PPO | Source: Ambulatory Visit

## 2021-04-29 ENCOUNTER — Inpatient Hospital Stay: Admission: RE | Admit: 2021-04-29 | Payer: PPO | Source: Ambulatory Visit

## 2021-05-02 ENCOUNTER — Inpatient Hospital Stay: Admission: RE | Admit: 2021-05-02 | Payer: PPO | Source: Ambulatory Visit

## 2021-05-09 ENCOUNTER — Other Ambulatory Visit: Payer: Self-pay

## 2021-05-09 ENCOUNTER — Ambulatory Visit (INDEPENDENT_AMBULATORY_CARE_PROVIDER_SITE_OTHER)
Admission: RE | Admit: 2021-05-09 | Discharge: 2021-05-09 | Disposition: A | Discharge status: Home / Self Care | Payer: PPO | Source: Ambulatory Visit | Attending: Physician Assistant | Admitting: Physician Assistant

## 2021-05-09 DIAGNOSIS — K429 Umbilical hernia without obstruction or gangrene: Secondary | ICD-10-CM | POA: Diagnosis not present

## 2021-05-09 DIAGNOSIS — R1084 Generalized abdominal pain: Secondary | ICD-10-CM

## 2021-05-09 DIAGNOSIS — R1013 Epigastric pain: Secondary | ICD-10-CM

## 2021-05-09 DIAGNOSIS — K432 Incisional hernia without obstruction or gangrene: Secondary | ICD-10-CM | POA: Diagnosis not present

## 2021-05-09 DIAGNOSIS — R110 Nausea: Secondary | ICD-10-CM | POA: Diagnosis not present

## 2021-05-09 DIAGNOSIS — K5730 Diverticulosis of large intestine without perforation or abscess without bleeding: Secondary | ICD-10-CM | POA: Diagnosis not present

## 2021-05-09 MED ORDER — IOHEXOL 300 MG/ML  SOLN
100.0000 mL | Freq: Once | INTRAMUSCULAR | Status: AC | PRN
  Administered 2021-05-09: 100 mL via INTRAVENOUS

## 2021-05-13 ENCOUNTER — Telehealth: Payer: Self-pay

## 2021-05-13 ENCOUNTER — Other Ambulatory Visit: Payer: Self-pay

## 2021-05-13 ENCOUNTER — Telehealth: Payer: Self-pay | Admitting: Physician Assistant

## 2021-05-13 DIAGNOSIS — K8050 Calculus of bile duct without cholangitis or cholecystitis without obstruction: Secondary | ICD-10-CM

## 2021-05-13 DIAGNOSIS — R109 Unspecified abdominal pain: Secondary | ICD-10-CM

## 2021-05-13 NOTE — Telephone Encounter (Signed)
CTAP 05/09/2021 results Mild biliary dilatation status post cholecystectomy, within physiologic limits. The patient's LFTs were not elevated on 04/21/2021. 6 mm right hepatic lobe lesion  Distal colonic diverticulosis with mild wall thickening, but no surrounding inflammation to suggest active diverticulitis. No evidence of bowel obstruction or perforation. Stable small residual/recurrent ventral abdominal wall hernias containing only fat. Aortic Atherosclerosis   The small hepatic lesion is highly likely to be benign however lesions this small are hard to further characterize on CT. Proceed with an abd MRI/MRCP to further evaluate the hepatic lesion and to evaluate for CBD stones

## 2021-05-13 NOTE — Telephone Encounter (Signed)
Dr Fuller Plan Patient has seen the results of her CT through My Chart. 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 She does not like that it has phrases that use "likely" "probably cysts" because she is "afraid of the possibilities." Patient was seen by Nicoletta Ba, PA-C on 04/21/21 for epigastric pain. She is on chronic BID PPI therapy. History of choledocholithiasis, ERCP in 2019 and Amy wanted to be sure that was not happening again.  Thank you for your help.

## 2021-05-13 NOTE — Telephone Encounter (Signed)
Spoke with the patient. She is very appreciative of the information. Agrees with this plan of care. Order placed for the imaging and radiology schedulers notified.

## 2021-05-13 NOTE — Telephone Encounter (Signed)
Pt called inquiring about imaging results. Pls call her.

## 2021-05-20 ENCOUNTER — Ambulatory Visit: Payer: PPO | Admitting: Pulmonary Disease

## 2021-05-20 ENCOUNTER — Encounter: Payer: Self-pay | Admitting: Pulmonary Disease

## 2021-05-20 ENCOUNTER — Other Ambulatory Visit: Payer: Self-pay

## 2021-05-20 VITALS — BP 130/78 | HR 65 | Temp 98.0°F | Ht 62.0 in | Wt 188.2 lb

## 2021-05-20 DIAGNOSIS — G4733 Obstructive sleep apnea (adult) (pediatric): Secondary | ICD-10-CM

## 2021-05-20 DIAGNOSIS — Z23 Encounter for immunization: Secondary | ICD-10-CM

## 2021-05-20 DIAGNOSIS — R634 Abnormal weight loss: Secondary | ICD-10-CM | POA: Diagnosis not present

## 2021-05-20 NOTE — Progress Notes (Signed)
[facility]  Chief Complaint  Patient presents with   Follow-up    Does not use CPAP due to how old it is and cannot afford a new one.     Past Surgical History:  She  has a past surgical history that includes Cesarean section; Cholecystectomy; Foot fracture surgery; Foot neuroma surgery; Tubal ligation (1981); Wrist fracture surgery; Finger surgery; Tonsillectomy; Upper gastrointestinal endoscopy; Colonoscopy; and Endoscopic retrograde cholangiopancreatography (ercp) with propofol (N/A, 09/17/2017).  Past Medical History:  Allergies, Anxiety, Depression, GERD, Barrett's Esophagus, Hiatal Hernia, HTN, Osteoporosis, PPD positive  Constitutional:  BP 130/78 (BP Location: Left Arm, Patient Position: Sitting)   Pulse 65   Temp 98 F (36.7 C) (Oral)   Ht 5\' 2"  (1.575 m)   Wt 188 lb 3.2 oz (85.4 kg)   SpO2 96% Comment: on RA  BMI 34.42 kg/m   Brief Summary:  [name] is a 75 y.o. female with obstructive sleep apnea.      Subjective:   I last saw her in 2017.  Since then she has lost about 55 lbs.  She retired and then started paying more attention to her diet and avoiding snacks.   She hasn't been able to use her Bipap for few months.  She ran out of supplies.  She feels her sleep has been okay without Bipap.  Physical Exam:   Appearance - well kempt   ENMT - no sinus tenderness, no oral exudate, no LAN, Mallampati 3 airway, no stridor  Respiratory - equal breath sounds bilaterally, no wheezing or rales  CV - s1s2 regular rate and rhythm, no murmurs  Ext - no clubbing, no edema  Skin - no rashes  Psych - normal mood and affect   Sleep Tests:  PSG 03/31/02 >> AHI 64 BiPAP titration 12/01/07 >> BiPAP 14/10 cm H2O >> centrals with higher pressures Pressure change 06/12/15 >> BiPAP 12/8 cm H2O ONO with BiPAP 07/07/15 >> test time 7hrs 57 min.  Basal SpO2 91.8%, low SpO2 85%.  Spent 7.1 min with SpO2 < 88%.    Social  History:  She  reports that she has never smoked. She has never used smokeless tobacco. She reports that she does not drink alcohol and does not use drugs.  Family History:  Her family history includes Allergies in her mother; Alzheimer's disease in her mother; Colon cancer in her maternal uncle; Coronary artery disease in her father; Diabetes in her son; Heart attack in her paternal grandmother; Heart failure in her father and mother; Hypertension in her brother; Lung cancer in her paternal grandfather; Sleep apnea in her brother and son.    Discussion:  She has history of sleep apnea and was maintained on Bipap therapy.  Over the past few years she has lost a significant amount of weight.  Her sleep has improved with weight loss.  She might be at a point where she could either transition off therapy completely, or transition from Bipap to CPAP which would also be a more affordable option.  Assessment/Plan:   Obstructive sleep apnea. - she has been on Bipap 12/8 cm H2O - she was using [employer] for her DME - will arrange for home sleep study to assess current status of sleep apnea and then determine if she needs to get new machine and new supplies  Time Spent Involved in Patient Care on Day of Examination:  23 minutes  Follow up:   Patient Instructions  High dose flu  shot today  Will arrange for home sleep study  Will call to arrange for follow up after sleep study reviewed  Medication List:   Allergies as of 05/20/2021       Reactions   Epinephrine    REACTION: tachycardia        Medication List        Accurate as of May 20, 2021 10:06 AM. If you have any questions, ask your nurse or doctor.          STOP taking these medications    triamterene-hydrochlorothiazide 37.5-25 MG tablet Commonly known as: Maxzide-25 Stopped by: Chesley Mires, MD       TAKE these medications    acetaminophen 325 MG tablet Commonly known as: TYLENOL Take 650 mg by mouth every  6 (six) hours as needed. For pain   alendronate 70 MG tablet Commonly known as: FOSAMAX TAKE 1 TABLET BY MOUTH ONCE A WEEK. TAKE WITH A FULL GLASS OF WATER ON AN EMPTY STOMACH.   ALPRAZolam 0.25 MG tablet Commonly known as: XANAX Take 0.125 mg by mouth 3 (three) times daily as needed. For anxiety   buPROPion 300 MG 24 hr tablet Commonly known as: WELLBUTRIN XL Take 300 mg by mouth daily.   Calcium Carbonate-Vitamin D 600-400 MG-UNIT tablet Take 1 tablet by mouth daily.   dicyclomine 10 MG capsule Commonly known as: BENTYL TAKE 1 CAPSULE BY MOUTH 3 TIMES DAILY AS NEEDED FOR SPASMS.   famotidine 20 MG tablet Commonly known as: PEPCID Take 20 mg by mouth at bedtime as needed. For allergies   fluticasone 50 MCG/ACT nasal spray Commonly known as: FLONASE 2 SPRAYS IN EACH NOSTRIL ONCE A DAY.   mirabegron ER 25 MG Tb24 tablet Commonly known as: MYRBETRIQ Take 1 tablet by mouth daily.   multivitamin with minerals Tabs tablet Take 1 tablet by mouth daily.   pantoprazole 40 MG tablet Commonly known as: PROTONIX One tablet by mouth twice daily before breakfast and dinner   tolterodine 4 MG 24 hr capsule Commonly known as: DETROL LA Take 4 mg by mouth at bedtime.        Signature:  Chesley Mires, MD [place] Pager - [phone] 05/20/2021, 10:06 AM

## 2021-05-20 NOTE — Patient Instructions (Signed)
High dose flu shot today  Will arrange for home sleep study  Will call to arrange for follow up after sleep study reviewed

## 2021-05-26 ENCOUNTER — Other Ambulatory Visit: Payer: Self-pay

## 2021-05-26 ENCOUNTER — Ambulatory Visit (HOSPITAL_COMMUNITY)
Admission: RE | Admit: 2021-05-26 | Discharge: 2021-05-26 | Disposition: A | Discharge status: Home / Self Care | Payer: PPO | Source: Ambulatory Visit | Attending: Physician Assistant | Admitting: Physician Assistant

## 2021-05-26 ENCOUNTER — Other Ambulatory Visit: Payer: Self-pay | Admitting: Physician Assistant

## 2021-05-26 DIAGNOSIS — Z9049 Acquired absence of other specified parts of digestive tract: Secondary | ICD-10-CM | POA: Diagnosis not present

## 2021-05-26 DIAGNOSIS — N281 Cyst of kidney, acquired: Secondary | ICD-10-CM | POA: Diagnosis not present

## 2021-05-26 DIAGNOSIS — R109 Unspecified abdominal pain: Secondary | ICD-10-CM

## 2021-05-26 DIAGNOSIS — K8050 Calculus of bile duct without cholangitis or cholecystitis without obstruction: Secondary | ICD-10-CM

## 2021-05-26 DIAGNOSIS — K760 Fatty (change of) liver, not elsewhere classified: Secondary | ICD-10-CM | POA: Diagnosis not present

## 2021-05-26 DIAGNOSIS — R932 Abnormal findings on diagnostic imaging of liver and biliary tract: Secondary | ICD-10-CM | POA: Diagnosis not present

## 2021-05-26 MED ORDER — GADOBUTROL 1 MMOL/ML IV SOLN
7.5000 mL | Freq: Once | INTRAVENOUS | Status: AC | PRN
  Administered 2021-05-26: 7.5 mL via INTRAVENOUS

## 2021-05-28 ENCOUNTER — Telehealth: Payer: Self-pay | Admitting: Physician Assistant

## 2021-05-28 DIAGNOSIS — J209 Acute bronchitis, unspecified: Secondary | ICD-10-CM | POA: Diagnosis not present

## 2021-05-28 DIAGNOSIS — R509 Fever, unspecified: Secondary | ICD-10-CM | POA: Diagnosis not present

## 2021-05-28 DIAGNOSIS — J0100 Acute maxillary sinusitis, unspecified: Secondary | ICD-10-CM | POA: Diagnosis not present

## 2021-05-28 DIAGNOSIS — Z20828 Contact with and (suspected) exposure to other viral communicable diseases: Secondary | ICD-10-CM | POA: Diagnosis not present

## 2021-05-28 NOTE — Telephone Encounter (Signed)
Patient called and wanted to know if the results from the MRI she had done on Monday had been reviewed.  Please call.

## 2021-05-28 NOTE — Telephone Encounter (Signed)
Patient is advised of her normal MRI. She reports no change in her symptoms. She continues to have days of nausea and abdominal pain. She has not been able to determine any food triggers.

## 2021-05-29 NOTE — Telephone Encounter (Signed)
Patient wants to think about it. She reports she does have some good days. Asks me to call her back 06/02/21.

## 2021-05-30 ENCOUNTER — Other Ambulatory Visit: Payer: Self-pay

## 2021-05-30 NOTE — Telephone Encounter (Signed)
Spoke with patient, advised that EGD would show more than imaging and Dr. Fuller Plan could take biopsies if necessary. Pt wishes to proceed with EGD. Pt has been scheduled for a pre-visit with the nurse on Tuesday, 06/24/21 at 2:30 pm. Pt os aware that she will need to check in on the 2nd floor. Pt is scheduled for EGD in the [facility] with Dr. Fuller Plan on Monday, 07/07/21 at 10 am. Pt is aware that she will need to arrive at 9 am with a care partner. Patient verbalized understanding and had no concerns at the end of the call.

## 2021-05-30 NOTE — Telephone Encounter (Signed)
Pt called this morning and wanted more info on CT & MRI you discussed.  Questioned if endoscopy would show things the CT or MRI wouldn't.

## 2021-06-23 ENCOUNTER — Telehealth: Payer: Self-pay | Admitting: Physician Assistant

## 2021-06-23 NOTE — Telephone Encounter (Signed)
Inbound call from pt requesting a call back stating that she is having upper GI pain. Please advise. Thank you.

## 2021-06-24 ENCOUNTER — Other Ambulatory Visit: Payer: Self-pay

## 2021-06-24 ENCOUNTER — Ambulatory Visit (AMBULATORY_SURGERY_CENTER): Payer: Self-pay

## 2021-06-24 VITALS — Ht 62.0 in | Wt 189.0 lb

## 2021-06-24 DIAGNOSIS — R1013 Epigastric pain: Secondary | ICD-10-CM

## 2021-06-24 DIAGNOSIS — R110 Nausea: Secondary | ICD-10-CM

## 2021-06-24 NOTE — Progress Notes (Signed)
No egg or soy allergy known to patient  No issues known to pt with past sedation with any surgeries or procedures Patient denies ever being told they had issues or difficulty with intubation  No FH of Malignant Hyperthermia Pt is not on diet pills Pt is not on  home 02  Pt is not on blood thinners  Pt denies issues with constipation  No A fib or A flutter  Pt is fully vaccinated  for Covid   EGD  Due to the COVID-19 pandemic we are asking patients to follow certain guidelines in [place] and the [place]   Pt aware of COVID protocols and [facility] guidelines

## 2021-06-24 NOTE — Telephone Encounter (Signed)
Spoke with the patient. She is asking if her recent imaging shows any musculoskeletal findings in the sacral area.  She will contact her orthopedist to have him review and determine this. She is having pain in that area and wants to see if the cause can be determined.

## 2021-06-25 ENCOUNTER — Encounter: Payer: Self-pay | Admitting: Gastroenterology

## 2021-07-06 ENCOUNTER — Encounter: Payer: Self-pay | Admitting: Certified Registered Nurse Anesthetist

## 2021-07-07 ENCOUNTER — Ambulatory Visit (AMBULATORY_SURGERY_CENTER): Payer: PPO | Admitting: Gastroenterology

## 2021-07-07 ENCOUNTER — Encounter: Payer: Self-pay | Admitting: Gastroenterology

## 2021-07-07 ENCOUNTER — Other Ambulatory Visit: Payer: Self-pay

## 2021-07-07 VITALS — BP 151/69 | HR 67 | Temp 98.2°F | Resp 14 | Ht 62.0 in | Wt 189.0 lb

## 2021-07-07 DIAGNOSIS — K317 Polyp of stomach and duodenum: Secondary | ICD-10-CM | POA: Diagnosis not present

## 2021-07-07 DIAGNOSIS — R1013 Epigastric pain: Secondary | ICD-10-CM

## 2021-07-07 DIAGNOSIS — R110 Nausea: Secondary | ICD-10-CM | POA: Diagnosis not present

## 2021-07-07 DIAGNOSIS — K5710 Diverticulosis of small intestine without perforation or abscess without bleeding: Secondary | ICD-10-CM

## 2021-07-07 DIAGNOSIS — G4733 Obstructive sleep apnea (adult) (pediatric): Secondary | ICD-10-CM | POA: Diagnosis not present

## 2021-07-07 MED ORDER — SODIUM CHLORIDE 0.9 % IV SOLN: 500.0000 mL | Freq: Once | INTRAVENOUS | Status: DC

## 2021-07-07 NOTE — Progress Notes (Signed)
Report given to PACU, vss 

## 2021-07-07 NOTE — Progress Notes (Signed)
Called to room to assist during endoscopic procedure.  Patient ID and intended procedure confirmed with present staff. Received instructions for my participation in the procedure from the performing physician.  

## 2021-07-07 NOTE — Patient Instructions (Signed)
Information on hiatal hernia given to you today.  Await pathology results from the biopsies taken today.  Resume previous diet and medications.   YOU HAD AN ENDOSCOPIC PROCEDURE TODAY AT [facility]:   Refer to the procedure report that was given to you for any specific questions about what was found during the examination.  If the procedure report does not answer your questions, please call your gastroenterologist to clarify.  If you requested that your care partner not be given the details of your procedure findings, then the procedure report has been included in a sealed envelope for you to review at your convenience later.  YOU SHOULD EXPECT: Some feelings of bloating in the abdomen. Passage of more gas than usual.  Walking can help get rid of the air that was put into your GI tract during the procedure and reduce the bloating. If you had a lower endoscopy (such as a colonoscopy or flexible sigmoidoscopy) you may notice spotting of blood in your stool or on the toilet paper. If you underwent a bowel prep for your procedure, you may not have a normal bowel movement for a few days.  Please Note:  You might notice some irritation and congestion in your nose or some drainage.  This is from the oxygen used during your procedure.  There is no need for concern and it should clear up in a day or so.  SYMPTOMS TO REPORT IMMEDIATELY:   Following upper endoscopy (EGD)  Vomiting of blood or coffee ground material  New chest pain or pain under the shoulder blades  Painful or persistently difficult swallowing  New shortness of breath  Fever of 100F or higher  Black, tarry-looking stools  For urgent or emergent issues, a gastroenterologist can be reached at any hour by calling [phone]. Do not use MyChart messaging for urgent concerns.    DIET:  We do recommend a small meal at first, but then you may proceed to your regular diet.  Drink plenty of fluids but you should avoid  alcoholic beverages for 24 hours.  ACTIVITY:  You should plan to take it easy for the rest of today and you should NOT DRIVE or use heavy machinery until tomorrow (because of the sedation medicines used during the test).    FOLLOW UP: Our staff will call the number listed on your records 48-72 hours following your procedure to check on you and address any questions or concerns that you may have regarding the information given to you following your procedure. If we do not reach you, we will leave a message.  We will attempt to reach you two times.  During this call, we will ask if you have developed any symptoms of COVID 19. If you develop any symptoms (ie: fever, flu-like symptoms, shortness of breath, cough etc.) before then, please call [phone].  If you test positive for Covid 19 in the 2 weeks post procedure, please call and report this information to [place].    If any biopsies were taken you will be contacted by phone or by letter within the next 1-3 weeks.  Please call us at [phone] if you have not heard about the biopsies in 3 weeks.    SIGNATURES/CONFIDENTIALITY: You and/or your care partner have signed paperwork which will be entered into your electronic medical record.  These signatures attest to the fact that that the information above on your After Visit Summary has been reviewed and is understood.  Full responsibility of the  confidentiality of this discharge information lies with you and/or your care-partner.  

## 2021-07-07 NOTE — Op Note (Signed)
[facility] Patient Name: [name] Procedure Date: 07/07/2021 10:01 AM MRN: [id number] Endoscopist: Ladene Artist , MD Age: 75 Referring MD:  Date of Birth: 1945/10/07 Gender: Female Account #: [id number] Procedure:                Upper GI endoscopy Indications:              Epigastric abdominal pain, Nausea Medicines:                Monitored Anesthesia Care Procedure:                Pre-Anesthesia Assessment:                           - Prior to the procedure, a History and Physical                            was performed, and patient medications and                            allergies were reviewed. The patient's tolerance of                            previous anesthesia was also reviewed. The risks                            and benefits of the procedure and the sedation                            options and risks were discussed with the patient.                            All questions were answered, and informed consent                            was obtained. Prior Anticoagulants: The patient has                            taken no previous anticoagulant or antiplatelet                            agents. ASA Grade Assessment: II - A patient with                            mild systemic disease. After reviewing the risks                            and benefits, the patient was deemed in                            satisfactory condition to undergo the procedure.                           After obtaining informed consent, the endoscope was  passed under direct vision. Throughout the                            procedure, the patient's blood pressure, pulse, and                            oxygen saturations were monitored continuously. The                            GIF [id number] was introduced through the                            mouth, and advanced to the second part of duodenum.                            The upper GI  endoscopy was accomplished without                            difficulty. The patient tolerated the procedure                            well. Scope In: Scope Out: Findings:                 The examined esophagus was normal.                           Multiple 4 to 8 mm sessile polyps with no bleeding                            and no stigmata of recent bleeding were found in                            the gastric fundus and in the gastric body.                            Biopsies were taken with a cold forceps for                            histology.                           Patchy mildly erythematous mucosa without bleeding                            was found in the gastric body and in the gastric                            antrum. Sampling biopsies of several larger polyps                            were taken with a cold forceps for histology.                           A  small hiatal hernia was present.                           The exam of the stomach was otherwise normal.                           A 10 mm non-bleeding diverticulum was found in the                            area of the papilla.                           The exam of the duodenum was otherwise normal. Complications:            No immediate complications. Estimated Blood Loss:     Estimated blood loss was minimal. Impression:               - Normal esophagus.                           - Multiple gastric polyps. Biopsied.                           - Erythematous mucosa in the gastric body and                            antrum. Biopsied.                           - Small hiatal hernia.                           - Non-bleeding duodenal diverticulum. Recommendation:           - Patient has a contact number available for                            emergencies. The signs and symptoms of potential                            delayed complications were discussed with the                            patient. Return to normal  activities tomorrow.                            Written discharge instructions were provided to the                            patient.                           - Resume previous diet.                           - Continue present medications.                           -  Await pathology results. Ladene Artist, MD 07/07/2021 10:18:00 AM This report has been signed electronically.

## 2021-07-07 NOTE — Progress Notes (Signed)
Pt's states no medical or surgical changes since previsit or office visit. 

## 2021-07-07 NOTE — Progress Notes (Signed)
History & Physical  Primary Care Physician:  Tanda Rockers, MD Primary Gastroenterologist: Lucio Edward, MD  CHIEF COMPLAINT: Epigastric pain  HPI: [name] is a 75 y.o. female with epigastric pain and nausea presenting for EGD.   Past Medical History:  Diagnosis Date   Allergic rhinitis    Allergy    Anxiety    Anxiety and depression    Arthritis    Asthma    years ago-1993-94   Barrett's esophagus 05/2005   Blood transfusion without reported diagnosis    as baby   Cancer ([place])    Basal cell face   Cataract    bilateral removal   Depression    Diverticulosis    Esophageal stricture    Gastric polyps    GERD (gastroesophageal reflux disease)    Hiatal hernia    Hypertension    on meds   Morbid obesity (HCC)    OSA (obstructive sleep apnea)    Osteoporosis    Pneumonia    many years ago   Positive PPD    Sleep apnea    bi pap    Past Surgical History:  Procedure Laterality Date   CESAREAN SECTION     x 2   CHOLECYSTECTOMY     COLONOSCOPY     ENDOSCOPIC RETROGRADE CHOLANGIOPANCREATOGRAPHY (ERCP) WITH PROPOFOL N/A 09/17/2017   Procedure: ENDOSCOPIC RETROGRADE CHOLANGIOPANCREATOGRAPHY (ERCP) WITH PROPOFOL;  Surgeon: Ladene Artist, MD;  Location: [facility] END[facility]COPY;  Service: Endoscopy;  Laterality: N/A;   FINGER SURGERY     cyst removel from right index finger   FOOT FRACTURE SURGERY     right    FOOT NEUROMA SURGERY     left    TONSILLECTOMY     TUBAL LIGATION  1981   UPPER GASTROINTESTINAL ENDOSCOPY     WRIST FRACTURE SURGERY     right     Prior to Admission medications   Medication Sig Start Date End Date Taking? Authorizing Provider  buPROPion (WELLBUTRIN XL) 300 MG 24 hr tablet Take 300 mg by mouth daily.   Yes [provider]  Calcium Carbonate-Vitamin D 600-400 MG-UNIT per tablet Take 1 tablet by mouth daily.   Yes [provider]  dicyclomine (BENTYL) 10 MG capsule TAKE 1 CAPSULE BY MOUTH 3 TIMES DAILY AS NEEDED FOR  SPASMS. 09/17/20  Yes Ladene Artist, MD  fluticasone [facility]) 50 MCG/ACT nasal spray 2 SPRAYS IN EACH NOSTRIL ONCE A DAY. 01/24/21  Yes Tanda Rockers, MD  mirabegron ER (MYRBETRIQ) 25 MG TB24 tablet Take 1 tablet by mouth daily.   Yes [provider]  Multiple Vitamin (MULTIVITAMIN WITH MINERALS) TABS tablet Take 1 tablet by mouth daily.   Yes [provider]  pantoprazole (PROTONIX) 40 MG tablet One tablet by mouth twice daily before breakfast and dinner 09/17/20  Yes Ladene Artist, MD  tolterodine (DETROL LA) 4 MG 24 hr capsule Take 4 mg by mouth at bedtime.   Yes [provider]  acetaminophen (TYLENOL) 325 MG tablet Take 650 mg by mouth every 6 (six) hours as needed. For pain    [provider]  alendronate (FOSAMAX) 70 MG tablet TAKE 1 TABLET BY MOUTH ONCE A WEEK. TAKE WITH A FULL GLASS OF WATER ON AN EMPTY STOMACH. Patient not taking: Reported on 07/07/2021 01/24/21   Tanda Rockers, MD  ALPRAZolam Duanne Moron) 0.25 MG tablet Take 0.125 mg by mouth 3 (three) times daily as needed. For anxiety  [provider]  famotidine (PEPCID) 20 MG tablet Take 20 mg by mouth at bedtime as needed. For allergies    [provider]    Current Outpatient Medications  Medication Sig Dispense Refill   buPROPion (WELLBUTRIN XL) 300 MG 24 hr tablet Take 300 mg by mouth daily.     Calcium Carbonate-Vitamin D 600-400 MG-UNIT per tablet Take 1 tablet by mouth daily.     dicyclomine (BENTYL) 10 MG capsule TAKE 1 CAPSULE BY MOUTH 3 TIMES DAILY AS NEEDED FOR SPASMS. 90 capsule 11   fluticasone (FLONASE) 50 MCG/ACT nasal spray 2 SPRAYS IN EACH NOSTRIL ONCE A DAY. 16 g 11   mirabegron ER (MYRBETRIQ) 25 MG TB24 tablet Take 1 tablet by mouth daily.     Multiple Vitamin (MULTIVITAMIN WITH MINERALS) TABS tablet Take 1 tablet by mouth daily.     pantoprazole (PROTONIX) 40 MG tablet One tablet by mouth twice daily before breakfast and dinner 60 tablet 11   tolterodine  (DETROL LA) 4 MG 24 hr capsule Take 4 mg by mouth at bedtime.     acetaminophen (TYLENOL) 325 MG tablet Take 650 mg by mouth every 6 (six) hours as needed. For pain     alendronate (FOSAMAX) 70 MG tablet TAKE 1 TABLET BY MOUTH ONCE A WEEK. TAKE WITH A FULL GLASS OF WATER ON AN EMPTY STOMACH. (Patient not taking: Reported on 07/07/2021) 4 tablet 4   ALPRAZolam (XANAX) 0.25 MG tablet Take 0.125 mg by mouth 3 (three) times daily as needed. For anxiety     famotidine (PEPCID) 20 MG tablet Take 20 mg by mouth at bedtime as needed. For allergies     Current Facility-Administered Medications  Medication Dose Route Frequency Provider Last Rate Last Admin   0.9 %  sodium chloride infusion  500 mL Intravenous Once Ladene Artist, MD        Allergies as of 07/07/2021 - Review Complete 07/07/2021  Allergen Reaction Noted   Epinephrine      Family History  Problem Relation Age of Onset   Allergies Mother    Alzheimer's disease Mother    Heart failure Mother    Coronary artery disease Father    Heart failure Father    Hypertension Brother        x 3   Sleep apnea Brother    Colon cancer Maternal Uncle    Heart attack Paternal Grandmother    Lung cancer Paternal Grandfather    Sleep apnea Son    Diabetes Son    Rectal cancer Neg Hx    Stomach cancer Neg Hx    Esophageal cancer Neg Hx    Pancreatic cancer Neg Hx    Colon polyps Neg Hx     Social History   Socioeconomic History   Marital status: Widowed    Spouse name: Not on file   Number of children: 2   Years of education: Not on file   Highest education level: Not on file  Occupational History   Occupation: Writer: OTHER    Comment: [employer] in [place] Use   Smoking status: Never   Smokeless tobacco: Never   Tobacco comments:    pt has never smoked but husband smoked  Vaping Use   Vaping Use: Never used  Substance and Sexual Activity   Alcohol use: Not Currently   Drug use: Never    Sexual activity: Not Currently  Other Topics Concern   Not  on file  Social History Narrative   She lives alone (widowed).   She works as Facilities manager.   Highest level of education:  Some college   Social Determinants of Radio broadcast assistant Strain: Not on file  Food Insecurity: Not on file  Transportation Needs: Not on file  Physical Activity: Not on file  Stress: Not on file  Social Connections: Not on file  Intimate Partner Violence: Not on file    Review of Systems:  All systems reviewed an negative except where noted in HPI.  Gen: Denies any fever, chills, sweats, anorexia, fatigue, weakness, malaise, weight loss, and sleep disorder CV: Denies chest pain, angina, palpitations, syncope, orthopnea, PND, peripheral edema, and claudication. Resp: Denies dyspnea at rest, dyspnea with exercise, cough, sputum, wheezing, coughing up blood, and pleurisy. GI: Denies vomiting blood, jaundice, and fecal incontinence.   Denies dysphagia or odynophagia. GU : Denies urinary burning, blood in urine, urinary frequency, urinary hesitancy, nocturnal urination, and urinary incontinence. MS: Denies joint pain, limitation of movement, and swelling, stiffness, low back pain, extremity pain. Denies muscle weakness, cramps, atrophy.  Derm: Denies rash, itching, dry skin, hives, moles, warts, or unhealing ulcers.  Psych: Denies depression, anxiety, memory loss, suicidal ideation, hallucinations, paranoia, and confusion. Heme: Denies bruising, bleeding, and enlarged lymph nodes. Neuro:  Denies any headaches, dizziness, paresthesias. Endo:  Denies any problems with DM, thyroid, adrenal function.   Physical Exam: General:  Alert, well-developed, in NAD Head:  Normocephalic and atraumatic. Eyes:  Sclera clear, no icterus.   Conjunctiva pink. Ears:  Normal auditory acuity. Mouth:  No deformity or lesions.  Neck:  Supple; no masses . Lungs:  Clear throughout to auscultation.   No  wheezes, crackles, or rhonchi. No acute distress. Heart:  Regular rate and rhythm; no murmurs. Abdomen:  Soft, nondistended, nontender. No masses, hepatomegaly. No obvious masses.  Normal bowel .    Rectal:  Deferred   Msk:  Symmetrical without gross deformities.. Pulses:  Normal pulses noted. Extremities:  Without edema. Neurologic:  Alert and  oriented x4;  grossly normal neurologically. Skin:  Intact without significant lesions or rashes. Cervical Nodes:  No significant cervical adenopathy. Psych:  Alert and cooperative. Normal mood and affect.   Impression / Plan:   Epigastric pain and nausea for EGD.    Pricilla Riffle. Fuller Plan  07/07/2021, 10:03 AM See Shea Evans, [facility], to contact our on call provider

## 2021-07-07 NOTE — Progress Notes (Signed)
1000 Robinul 0.1 mg IV given due large amount of secretions upon assessment.  MD made aware, vss 

## 2021-07-09 ENCOUNTER — Telehealth: Payer: Self-pay

## 2021-07-09 NOTE — Telephone Encounter (Signed)
  Follow up Call-  Call back number 07/07/2021  Post procedure Call Back phone  # [phone]  Permission to leave phone message Yes  Some recent data might be hidden     Patient questions:  Do you have a fever, pain , or abdominal swelling? No. Pain Score  0 *  Have you tolerated food without any problems? Yes.    Have you been able to return to your normal activities? Yes.    Do you have any questions about your discharge instructions: Diet   No. Medications  No. Follow up visit  No.  Do you have questions or concerns about your Care? Yes.    Actions: * If pain score is 4 or above: No action needed, pain <4.   Have you developed a fever since your procedure? no  2.   Have you had an respiratory symptoms (SOB or cough) since your procedure? no  3.   Have you tested positive for COVID 19 since your procedure no  4.   Have you had any family members/close contacts diagnosed with the COVID 19 since your procedure?  no   If yes to any of these questions please route to Joylene John, RN and Joella Prince, RN

## 2021-07-22 ENCOUNTER — Encounter: Payer: Self-pay | Admitting: Gastroenterology

## 2021-08-13 DIAGNOSIS — L209 Atopic dermatitis, unspecified: Secondary | ICD-10-CM | POA: Diagnosis not present

## 2021-08-21 ENCOUNTER — Ambulatory Visit: Payer: PPO

## 2021-08-21 ENCOUNTER — Other Ambulatory Visit: Payer: Self-pay

## 2021-08-21 DIAGNOSIS — G4733 Obstructive sleep apnea (adult) (pediatric): Secondary | ICD-10-CM

## 2021-08-21 DIAGNOSIS — R634 Abnormal weight loss: Secondary | ICD-10-CM

## 2021-08-26 ENCOUNTER — Telehealth: Payer: Self-pay | Admitting: Pulmonary Disease

## 2021-08-26 DIAGNOSIS — G4733 Obstructive sleep apnea (adult) (pediatric): Secondary | ICD-10-CM | POA: Diagnosis not present

## 2021-08-26 NOTE — Telephone Encounter (Signed)
HST 08/21/21 >> AHI 52.7, SpO2 low 79%.  Spent 113.8 min with SpO2 < 89%.   Please let her know that her sleep study shows she still has severe sleep apnea.  Options are to continue with Bipap therapy or arrange for an in lab titration study to see if she can transition to CPAP.  Please let me know which options she selects.

## 2021-08-28 NOTE — Telephone Encounter (Signed)
ATC patient. LMCTB with RDS office number.

## 2021-08-29 ENCOUNTER — Telehealth: Payer: Self-pay | Admitting: Pulmonary Disease

## 2021-08-29 NOTE — Telephone Encounter (Signed)
ATC patient regarding HST results. No Answer. LMTCB

## 2021-08-29 NOTE — Telephone Encounter (Signed)
Addressed in prev. Encounter.

## 2021-08-29 NOTE — Telephone Encounter (Signed)
Called and went over HST results with patient. She would like to continue bipap therapy. She states she does not want to use a cpap machine and would prefer not to do the transition from bipap to cpap therapy.  Will route to Dr. [name] as an Juluis Rainier.

## 2021-08-29 NOTE — Telephone Encounter (Signed)
Noted  

## 2021-09-09 ENCOUNTER — Encounter: Payer: Self-pay | Admitting: Internal Medicine

## 2021-09-09 ENCOUNTER — Other Ambulatory Visit: Payer: Self-pay

## 2021-09-09 ENCOUNTER — Ambulatory Visit: Payer: PPO | Admitting: Internal Medicine

## 2021-09-09 DIAGNOSIS — E7800 Pure hypercholesterolemia, unspecified: Secondary | ICD-10-CM

## 2021-09-09 DIAGNOSIS — G4733 Obstructive sleep apnea (adult) (pediatric): Secondary | ICD-10-CM

## 2021-09-09 NOTE — Assessment & Plan Note (Signed)
Followed in Pulmonary clinic/ [facility]/ [name] - on Bipap chronically> retitrated 05/16/2015  - HC03 up to 33 on bmet 12/20/13 > referred back to Dr [name] - HC03  12/24/2016  = 34   - HC03 01/14/2018 = 31  - HC03 01/24/2019   = 34  - HCO3 04/21/21    = 29   Hypercarbic component improved, f/u Dr [name] as planned, no change rx in meantime.         Each maintenance medication was reviewed in detail including emphasizing most importantly the difference between maintenance and prns and under what circumstances the prns are to be triggered using an action plan format where appropriate.  Total time for H and P, chart review, counseling,  and generating customized AVS unique to this office visit / same day charting = 25 min

## 2021-09-09 NOTE — Progress Notes (Signed)
Subjective:   Patient ID: [name], female    DOB: 06/26/1946     MRN: [id number]   Brief patient profile:  70 yowf never smoker with morbid obesity complicated by gastroesophageal reflux disease, and also a history of obstructive sleep apnea and chronic rhinitis/ hbp.   History of Present Illness  04/27/2014 f/u ov/Caylei Sperry re: obesity/ ohs/hbp/djd  Chief Complaint  Patient presents with   Annual Exam    Pt fasting. Overall doing well and denies any co's today.  no reg exercise, very tired in pms  > working out issues of sleep with [name] but he has not reviewed download yet rec Please remember to go to the lab and x-ray department downstairs for your tests - we will call you with the results when they are available. Prevnar today  Dr [name] will be in touch re your bipap settings    05/20/2018  f/u ov/Belen Pesch re:  [name] using ppi bid ac but not pepcid / new hoarseness x 76m/ plus hbp/ MO Chief Complaint  Patient presents with   Acute Visit    Hoarseness off and on for the past 4 months.   Dyspnea:  Not limited by breathing from desired activities  / no aerobic at all now  Cough:clearing throat more s pattern but  not noct/ notes more at hosp with voice use  Sleeping: cpap flat one pillow rec Add pepcid 20 mg at bedtime GERD diet  Please see patient coordinator before you leave today  to schedule ENT eval for hoarseness>  Saw [name] 06/07/18  dx chronic nasopharyngitis and dysphonia> improved with abx/ ns irrigation   06/05/19 NP eval for leg swelling ? Cellulitis  rec Venous dopplers > neg venous dopplers bilaterally 06/06/19  Legs elevated, low salt diet .  Please take Maxzide daily for 3 days , then As needed  Leg swelling  Doxycycline 100mg  Twice daily  For 1 week.  Warm compresses to lower right leg daily .  Keep legs clean and dry .   10/08/2020  f/u ov/Gauge Winski re: obesity  / hbp / leg swelling  Chief Complaint  Patient presents with   Annual Exam    Pt is fasting. Doing  well today and no new co's.   Dyspnea:  No regular walking/ aerobics/  Steps one - two floors ok  Cough: none Sleeping: on bipap/ bed is flat,one pillow  SABA use: none 02: none Covid status:   Vaccinated x 2, last 01/2020 not boosted  Rec  I very strongly recommend you get the  pfizer vaccine booster    09/09/2021  f/u ov/[place] office/Jaydrien Wassenaar re: hbp  maint on no rx   Chief Complaint  Patient presents with   Follow-up    Wants to discuss update in family hx- 1 brother just had MI and another brother had stroke recently. She is overall feeling well and no co's today.   Dyspnea:  walks neighborhood x 10 min with hills  Cough: none  Sleeping: bipap per [name]  SABA use: none  02: none  Covid status: vax x 2 - has had omicron 4-6 m prior rx paxlovid Lung cancer screening: never smoker    No obvious day to day or daytime variability or assoc excess/ purulent sputum or mucus plugs or hemoptysis or cp or chest tightness, subjective wheeze or overt sinus or hb symptoms.   Sleeping  without nocturnal  or early am exacerbation  of respiratory  c/o's or need for noct saba. Also denies  any obvious fluctuation of symptoms with weather or environmental changes or other aggravating or alleviating factors except as outlined above   No unusual exposure hx or h/o childhood pna/ asthma or knowledge of premature birth.  Current Allergies, Complete Past Medical History, Past Surgical History, Family History, and Social History were reviewed in [employer] record.  ROS  The following are not active complaints unless bolded Hoarseness, sore throat, dysphagia, dental problems, itching, sneezing,  nasal congestion or discharge of excess mucus or purulent secretions, ear ache,   fever, chills, sweats, unintended wt loss or wt gain, classically pleuritic or exertional cp,  orthopnea pnd or arm/hand swelling  or leg swelling, presyncope, palpitations, abdominal pain, anorexia, nausea,  vomiting, diarrhea  or change in bowel habits or change in bladder habits, change in stools or change in urine, dysuria, hematuria,  rash, arthralgias, visual complaints, headache, numbness, weakness or ataxia or problems with walking or coordination,  change in mood or  memory.        Current Meds  Medication Sig   acetaminophen (TYLENOL) 325 MG tablet Take 650 mg by mouth every 6 (six) hours as needed. For pain   alendronate (FOSAMAX) 70 MG tablet TAKE 1 TABLET BY MOUTH ONCE A WEEK. TAKE WITH A FULL GLASS OF WATER ON AN EMPTY STOMACH.   ALPRAZolam (XANAX) 0.25 MG tablet Take 0.125 mg by mouth 3 (three) times daily as needed. For anxiety   buPROPion (WELLBUTRIN XL) 300 MG 24 hr tablet Take 300 mg by mouth daily.   Calcium Carbonate-Vitamin D 600-400 MG-UNIT per tablet Take 1 tablet by mouth daily.   dicyclomine (BENTYL) 10 MG capsule TAKE 1 CAPSULE BY MOUTH 3 TIMES DAILY AS NEEDED FOR SPASMS.   famotidine (PEPCID) 20 MG tablet Take 20 mg by mouth at bedtime as needed. For allergies   fluticasone (FLONASE) 50 MCG/ACT nasal spray 2 SPRAYS IN EACH NOSTRIL ONCE A DAY.   mirabegron ER (MYRBETRIQ) 25 MG TB24 tablet Take 1 tablet by mouth daily.   Multiple Vitamin (MULTIVITAMIN WITH MINERALS) TABS tablet Take 1 tablet by mouth daily.   pantoprazole (PROTONIX) 40 MG tablet One tablet by mouth twice daily before breakfast and dinner   tolterodine (DETROL LA) 4 MG 24 hr capsule Take 4 mg by mouth at bedtime.   Current Facility-Administered Medications for the 09/09/21 encounter (Office Visit) with Tanda Rockers, MD  Medication   0.9 %  sodium chloride infusion                           Past Medical History:  HIATAL HERNIA (ICD-553.3) with GERD  - H/o Barretts esophagus  - EGD pos HH/ stricture 04/18/10 , neg barrett's  RHINITIS, CHRONIC (ICD-472.0) .................................................................... [name]  - Remote allergy testing = molds/dust only  - Sinus CT 09/06/08  mild thickening only  OBSTRUCTIVE SLEEP APNEA (ICD-327.23)..................................................[name]  --PSG 03/31/02 AHI 64  --Nocturnal BIPAP > titration done 05/16/2015  MORBID OBESITY (ICD-278.01)  - Target wt = 158 for BMI < 30  POSITIVE PPD (ICD-795.5)  DIVERTICULOSIS  Shingles R T5/6 03/2015  Depression / Anxiety ..............................................................[name] Karr Stasis dermatitis ..................................................................... [facility] [employer] MAIN[employer]CE........................................................[name] [place] - DT May 23, 2009,    01/24/2019  - Pneumovax 04/2013, Prevnar 04/27/2014, Pneumovax 01/24/2019  - 10/08/2020   - GYN  Laurin Coder NP        Family History:  Father - CAD, ? 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  Never smoker, but Husband smoked / passed 3299 Illicit Drug Use - no  Patient does not get regular exercise.  No etoh          Objective:   Physical Exam  09/09/2021     186 10/08/2020    187 03/07/2020    185   01/24/2019     174     08/02/2018  196  9 /30 /2010  224   Vital signs reviewed  09/09/2021  - Note at rest 02 sats  100% on RA    General appearance:    obese pleasant amb wf nad        HEENT : pt wearing mask not removed for exam due to covid -19 concerns.    NECK :  without JVD/Nodes/TM/ nl carotid upstrokes bilaterally   LUNGS: no acc muscle use, Mild/ mod kyhotic chest wall  which is clear to A and P bilaterally without cough on insp or exp maneuvers   CV:  RRR  no s3 or murmur or increase in P2, and trace pitting L ?> R ankle   ABD:  soft and  nontender with nl inspiratory excursion in the supine position. No bruits or organomegaly appreciated, bowel sounds nl  MS:  Nl gait/ ext warm without deformities, calf tenderness, cyanosis or clubbing No obvious joint restrictions   SKIN: warm and dry without lesions    NEURO:  alert, approp, nl sensorium with  no motor or cerebellar deficits apparent.                      Assessment & Plan:

## 2021-09-09 NOTE — Patient Instructions (Addendum)
I strongly recommend you reconsider the covalent covid vaccine  To get the most out of exercise, you need to be continuously aware that you are short of breath, but never out of breath, for at least 30 minutes daily. As you improve, it will actually be easier for you to do the same amount of exercise  in  30 minutes so always push to the level where you are short of breath.     I will be referring you to [facility] Internal medicine next available.  Pulmonary follow up is as needed

## 2021-09-09 NOTE — Assessment & Plan Note (Signed)
Followed as [place]     - Ideal LDL < 130 with hbp and  Pos fm hx  HDL's are relatively high and LDL's < 130 but fm hx strongly pos for ASCVD in two younger brothers  rec More regular aerobic ex Return for CPX per [facility]/ referred.

## 2021-09-30 ENCOUNTER — Ambulatory Visit: Payer: PPO | Admitting: Physician Assistant

## 2021-10-08 ENCOUNTER — Ambulatory Visit: Payer: PPO | Admitting: Internal Medicine

## 2021-10-13 ENCOUNTER — Telehealth: Payer: Self-pay | Admitting: Internal Medicine
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
## 2021-10-13 NOTE — Telephone Encounter (Signed)
If her breast is tender to touch she should see her GYN  Any other type of discomfort will need eval either by [place] or UC cause can't be eval over the phone

## 2021-10-13 NOTE — Telephone Encounter (Signed)
Called and spoke with patient. She verbalized understanding and stated that she will call her OB/GYN.   Nothing further needed at time of call.

## 2021-10-15 ENCOUNTER — Telehealth: Payer: Self-pay | Admitting: Internal Medicine

## 2021-10-15 DIAGNOSIS — G4733 Obstructive sleep apnea (adult) (pediatric): Secondary | ICD-10-CM

## 2021-10-16 ENCOUNTER — Ambulatory Visit: Payer: PPO | Admitting: Gastroenterology

## 2021-10-16 ENCOUNTER — Encounter: Payer: Self-pay | Admitting: Gastroenterology

## 2021-10-16 VITALS — BP 130/72 | HR 68 | Ht 62.0 in | Wt 185.0 lb

## 2021-10-16 DIAGNOSIS — D492 Neoplasm of unspecified behavior of bone, soft tissue, and skin: Secondary | ICD-10-CM | POA: Insufficient documentation

## 2021-10-16 DIAGNOSIS — K580 Irritable bowel syndrome with diarrhea: Secondary | ICD-10-CM

## 2021-10-16 DIAGNOSIS — K219 Gastro-esophageal reflux disease without esophagitis: Secondary | ICD-10-CM

## 2021-10-16 NOTE — Patient Instructions (Signed)
If you are age 74 or older, your body mass index should be between 23-30. Your Body mass index is 33.84 kg/m. If this is out of the aforementioned range listed, please consider follow up with your Primary Care Provider.  If you are age 3 or younger, your body mass index should be between 19-25. Your Body mass index is 33.84 kg/m. If this is out of the aformentioned range listed, please consider follow up with your Primary Care Provider.   ________________________________________________________  The [facility] providers would like to encourage you to use [facility] to communicate with providers for non-urgent requests or questions.  Due to long hold times on the telephone, sending your provider a message by [facility] may be a faster and more efficient way to get a response.  Please allow 48 business hours for a response.  Please remember that this is for non-urgent requests.  _______________________________________________________  Continue current medications as directed.  Follow up in 1 year.  Thank you for choosing me and [facility].  Pricilla Riffle. Dagoberto Ligas., MD., Marval Regal

## 2021-10-16 NOTE — Progress Notes (Signed)
° ° °  History of Present Illness: This is a 76 year old female with worsening epigastric pain and diarrhea.  She has a history of IBS and GERD and relates an increase in her IBS symptoms with postprandial epigastric pain and urgent loose diarrhea.  Taking dicyclomine and Imodium regularly controlled her symptoms.  Symptoms were more active for about 2 weeks and they have now returned to her baseline of intermittent postprandial epigastric pain and diarrhea.  Bentyl and Imodium appear effective in controlling symptoms.  Her reflux symptoms are under very good control.  Current Medications, Allergies, Past Medical History, Past Surgical History, Family History and Social History were reviewed in [employer] record.   Physical Exam: General: Well developed, well nourished, no acute distress Head: Normocephalic and atraumatic Eyes: Sclerae anicteric, EOMI Ears: Normal auditory acuity Mouth: Not examined, mask on during Covid-19 pandemic Lungs: Clear throughout to auscultation Heart: Regular rate and rhythm; no murmurs, rubs or bruits Abdomen: Soft, non tender and non distended. No masses, hepatosplenomegaly or hernias noted. Normal Bowel sounds Rectal: Not done Musculoskeletal: Symmetrical with no gross deformities  Pulses:  Normal pulses noted Extremities: No clubbing, cyanosis, edema or deformities noted Neurological: Alert oriented x 4, grossly nonfocal Psychological:  Alert and cooperative. Normal mood and affect   Assessment and Recommendations:  IBS-D.  Avoid foods and beverages that exacerbate symptoms.  Review low FODMAP diet for dietary stressors.  Continue dicyclomine 10 mg 4 times daily taken before meals and at bedtime as needed.  Imodium 1 p.o. 3 times daily as needed. REV in 1 year. GERD.  Follow antireflux measures.  Continue pantoprazole 40 mg p.o. twice daily continue famotidine 20 mg at bedtime as needed. REV in 1 year.

## 2021-10-16 NOTE — Telephone Encounter (Signed)
Order for New Bipap supplies placed. Nothing further needed. Called LVM for patient to be made aware.

## 2021-10-20 ENCOUNTER — Other Ambulatory Visit: Payer: Self-pay | Admitting: Gastroenterology

## 2021-12-11 ENCOUNTER — Ambulatory Visit (INDEPENDENT_AMBULATORY_CARE_PROVIDER_SITE_OTHER): Payer: PPO | Admitting: Family Medicine

## 2021-12-11 ENCOUNTER — Ambulatory Visit: Payer: PPO | Admitting: Nurse Practitioner

## 2021-12-11 ENCOUNTER — Encounter: Payer: Self-pay | Admitting: Family Medicine

## 2021-12-11 VITALS — BP 140/62 | HR 70 | Temp 98.2°F | Ht 62.0 in | Wt 187.0 lb

## 2021-12-11 DIAGNOSIS — E2839 Other primary ovarian failure: Secondary | ICD-10-CM

## 2021-12-11 DIAGNOSIS — N3281 Overactive bladder: Secondary | ICD-10-CM | POA: Diagnosis not present

## 2021-12-11 DIAGNOSIS — F419 Anxiety disorder, unspecified: Secondary | ICD-10-CM

## 2021-12-11 DIAGNOSIS — F32A Depression, unspecified: Secondary | ICD-10-CM

## 2021-12-11 DIAGNOSIS — Z1231 Encounter for screening mammogram for malignant neoplasm of breast: Secondary | ICD-10-CM

## 2021-12-11 DIAGNOSIS — R21 Rash and other nonspecific skin eruption: Secondary | ICD-10-CM | POA: Diagnosis not present

## 2021-12-11 DIAGNOSIS — M810 Age-related osteoporosis without current pathological fracture: Secondary | ICD-10-CM

## 2021-12-11 NOTE — Patient Instructions (Signed)
You can call to schedule your appointment with the psychiatrist.  A few offices are listed below for you to call.  ?  ? ?  ?[facility] ?[id number] ?[place], [place] [address] ?[phone] ?  ?[facility] ? [address], [place], [place], [place] [address]  ?Phone: [phone] ?  ?[facility] ?[address] ?[address] ?[place], [place] [address]  ?Phone: [phone]  ? ?[place]  ?Ask for a psychiatrist  ?[place]  ?(across from [facility])  ?[phone]  ?

## 2021-12-11 NOTE — Progress Notes (Signed)
? ?  Subjective:  ? ? Patient ID: [name], female    DOB: October 31, 1945, 76 y.o.   MRN: [id number] ? ?HPI ?Chief Complaint  ?Patient presents with  ? Establish Care  ?  Lump on right side of neck, about a week ago it was at its largest. Would also like to discuss rash on both lower legs.  ? ?Psychologist referral for medications and mammogram order.  ? ?She is new to the practice and here to establish care.  ? ?Dr. Melvyn Novas has graciously been managing her primary care while being a specialist.  ? ? ?States she needs to establish with a new psychiatrist since she can no longer afford to see Dr. [name] who she liked very much.  ?Reports history of anxiety and depression-  ?Reports being stable on her medications for the past 2 years including Viibryd, Wellbutrin and Xanax rarely  ? ?Overactive bladder- sees urology and is doing well on tolterodine and mirabegron ER.  ? ?History of osteoporosis and states she often forgets to take her Fosamax.  Overdue for bone density.  She would like to get this and her screening mammogram at [facility].  She has been getting her mammograms there for years. ? ?She also has a rash on her bilateral lower extremities, her feet up to mid shin.  States this happened approximately 3 months ago and it resolved with topical steroids.  Plans to see her dermatologist. ? ?No fever, chills, headache, dizziness, chest pain, palpitations, shortness of breath, abdominal pain, nausea, vomiting or diarrhea. ? ? 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  ? ?Review of Systems ?Pertinent positives and negatives in the history of present illness. ? ?   ?Objective:  ? Physical Exam ?BP 140/62 (BP Location: Left Arm, Patient Position: Sitting, Cuff Size: Large)   Pulse 70   Temp 98.2 ?F (36.8 ?C) (Temporal)   Ht '5\' 2"'$  (1.575 m)   Wt 187 lb (84.8 kg)   SpO2 97%   BMI 34.20 kg/m?  ? ?Alert and oriented and in no acute distress.  Respirations unlabored.  Erythema to  bilateral lower extremities and feet up to mid shin.  No edema, fluctuance or drainage. ? ? ?   ?Assessment & Plan:  ?Anxiety and depression ?-Her Viibryd dose is not in the computer.  She will call back and let us know how much she is taking.  I am happy to refill her anxiety and depression medications until she is able to establish with a new psychiatrist. ? ?Overactive bladder ?-Doing well on Myrbetriq and Detrol LA.  Continue follow-up with urology as needed. ? ?Osteoporosis, unspecified osteoporosis type, unspecified pathological fracture presence ?-DEXA ordered at [facility].  Encouraged her to set an alarm so she can remember to take the alendronate once weekly. ? ?Rash and nonspecific skin eruption ?-She will continue using topical steroids and be aware of any signs of cellulitis.  Plans to see her dermatologist in the next few days. ? ?Screening for breast cancer- ?Mammogram also ordered for [facility] since that is where she has been getting her mammograms the past several years. ? ?

## 2021-12-11 NOTE — Addendum Note (Signed)
Addended by: Rossie Muskrat on: 12/11/2021 05:03 PM ? ? Modules accepted: Orders ? ?

## 2021-12-17 ENCOUNTER — Telehealth: Payer: Self-pay

## 2021-12-17 NOTE — Telephone Encounter (Signed)
Pt calling to follow up with Mack Hook, NP.  ? ?Haven't heard from these below about scheduling appt.  ?Mammogram ?Psychiatrist  ?I sent referral team a message  ? ?Scheduled the DEXA for 5/1 ? ?FU with Dermatology  ?Say Rash was likely coming from feet and legs being swollen but the medication is helping.  ? ?Pt is questioning if she should have xray done due to hx of POS TB. Pt would have this done yearly with a CPE.  Pt also wondering if she should have labs done since its been over a year. ? ?Please advise ?

## 2021-12-17 NOTE — Telephone Encounter (Signed)
Pt is wondering if she should have xray done due to hx of positive TB, pt would have this done yearly with a CPE.  Pt also wondering if she should have labs done since its been over a year. ?

## 2021-12-17 NOTE — Telephone Encounter (Signed)
Pt is calling for an upon on the following referrals: ? ?Mammography and Psychiatrist. ? ?Please advise ?

## 2021-12-22 ENCOUNTER — Ambulatory Visit (INDEPENDENT_AMBULATORY_CARE_PROVIDER_SITE_OTHER)
Admission: RE | Admit: 2021-12-22 | Discharge: 2021-12-22 | Disposition: A | Discharge status: Home / Self Care | Payer: PPO | Source: Ambulatory Visit | Attending: Family Medicine | Admitting: Family Medicine

## 2021-12-22 DIAGNOSIS — E2839 Other primary ovarian failure: Secondary | ICD-10-CM | POA: Diagnosis not present

## 2021-12-22 DIAGNOSIS — M810 Age-related osteoporosis without current pathological fracture: Secondary | ICD-10-CM

## 2021-12-25 ENCOUNTER — Ambulatory Visit: Payer: PPO | Admitting: Family Medicine

## 2022-01-14 ENCOUNTER — Telehealth: Payer: Self-pay

## 2022-01-14 MED ORDER — VILAZODONE HCL 40 MG PO TABS: 40.0000 mg | ORAL_TABLET | Freq: Every day | ORAL | 1 refills | Status: DC

## 2022-01-14 MED ORDER — BUPROPION HCL ER (XL) 300 MG PO TB24: 300.0000 mg | ORAL_TABLET | Freq: Every day | ORAL | 1 refills | Status: DC

## 2022-01-14 NOTE — Telephone Encounter (Signed)
Rx sent 

## 2022-01-14 NOTE — Telephone Encounter (Signed)
Pt is requesting a refill on: buPROPion (WELLBUTRIN XL) 300 MG 24 hr tablet Vilazodone HCl (VIIBRYD) 40 MG TABS  Pharmacy: [facility],  - [place]  LOV: 12/11/21 ROV 01/20/22

## 2022-01-20 ENCOUNTER — Encounter: Payer: PPO | Admitting: Family Medicine

## 2022-01-27 LAB — HM MAMMOGRAPHY

## 2022-02-10 ENCOUNTER — Encounter: Payer: Self-pay | Admitting: Family Medicine

## 2022-02-10 ENCOUNTER — Ambulatory Visit (INDEPENDENT_AMBULATORY_CARE_PROVIDER_SITE_OTHER): Payer: PPO | Admitting: Family Medicine

## 2022-02-10 VITALS — BP 142/74 | HR 60 | Temp 97.8°F | Ht 62.0 in | Wt 183.0 lb

## 2022-02-10 DIAGNOSIS — E669 Obesity, unspecified: Secondary | ICD-10-CM | POA: Diagnosis not present

## 2022-02-10 DIAGNOSIS — M8580 Other specified disorders of bone density and structure, unspecified site: Secondary | ICD-10-CM | POA: Diagnosis not present

## 2022-02-10 DIAGNOSIS — I10 Essential (primary) hypertension: Secondary | ICD-10-CM

## 2022-02-10 DIAGNOSIS — E7800 Pure hypercholesterolemia, unspecified: Secondary | ICD-10-CM | POA: Diagnosis not present

## 2022-02-10 DIAGNOSIS — J310 Chronic rhinitis: Secondary | ICD-10-CM

## 2022-02-10 DIAGNOSIS — E559 Vitamin D deficiency, unspecified: Secondary | ICD-10-CM

## 2022-02-10 DIAGNOSIS — Z0001 Encounter for general adult medical examination with abnormal findings: Secondary | ICD-10-CM

## 2022-02-10 LAB — CBC WITH DIFFERENTIAL/PLATELET
Basophils Absolute: 0.1 10*3/uL (ref 0.0–0.1)
Basophils Relative: 1.1 % (ref 0.0–3.0)
Eosinophils Absolute: 0.2 10*3/uL (ref 0.0–0.7)
Eosinophils Relative: 2.2 % (ref 0.0–5.0)
HCT: 39.6 % (ref 36.0–46.0)
Hemoglobin: 13.1 g/dL (ref 12.0–15.0)
Lymphocytes Relative: 30.5 % (ref 12.0–46.0)
Lymphs Abs: 2.2 10*3/uL (ref 0.7–4.0)
MCHC: 33.1 g/dL (ref 30.0–36.0)
MCV: 90.9 fl (ref 78.0–100.0)
Monocytes Absolute: 0.5 10*3/uL (ref 0.1–1.0)
Monocytes Relative: 6.6 % (ref 3.0–12.0)
Neutro Abs: 4.2 10*3/uL (ref 1.4–7.7)
Neutrophils Relative %: 59.6 % (ref 43.0–77.0)
Platelets: 317 10*3/uL (ref 150.0–400.0)
RBC: 4.36 Mil/uL (ref 3.87–5.11)
RDW: 14.3 % (ref 11.5–15.5)
WBC: 7.1 10*3/uL (ref 4.0–10.5)

## 2022-02-10 LAB — LIPID PANEL
Cholesterol: 183 mg/dL (ref 0–200)
HDL: 47.3 mg/dL (ref >39.00)
LDL Cholesterol: 103 mg/dL — ABNORMAL HIGH (ref 0–99)
NonHDL: 135.98
Total CHOL/HDL Ratio: 4
Triglycerides: 167 mg/dL — ABNORMAL HIGH (ref 0.0–149.0)
VLDL: 33.4 mg/dL (ref 0.0–40.0)

## 2022-02-10 LAB — COMPREHENSIVE METABOLIC PANEL
ALT: 15 U/L (ref 0–35)
AST: 18 U/L (ref 0–37)
Albumin: 4.1 g/dL (ref 3.5–5.2)
Alkaline Phosphatase: 52 U/L (ref 39–117)
BUN: 10 mg/dL (ref 6–23)
CO2: 32 mEq/L (ref 19–32)
Calcium: 9.6 mg/dL (ref 8.4–10.5)
Chloride: 104 mEq/L (ref 96–112)
Creatinine, Ser: 0.75 mg/dL (ref 0.40–1.20)
GFR: 78.62 mL/min (ref >60.00)
Glucose, Bld: 90 mg/dL (ref 70–99)
Potassium: 4.4 mEq/L (ref 3.5–5.1)
Sodium: 141 mEq/L (ref 135–145)
Total Bilirubin: 0.4 mg/dL (ref 0.2–1.2)
Total Protein: 7 g/dL (ref 6.0–8.3)

## 2022-02-10 LAB — VITAMIN D 25 HYDROXY (VIT D DEFICIENCY, FRACTURES): VITD: 35.35 ng/mL (ref 30.00–100.00)

## 2022-02-10 LAB — TSH: TSH: 0.81 u[IU]/mL (ref 0.35–5.50)

## 2022-02-10 MED ORDER — FLUTICASONE PROPIONATE 50 MCG/ACT NA SUSP: NASAL | 11 refills | Status: DC

## 2022-02-10 NOTE — Progress Notes (Unsigned)
Subjective:    Patient ID: [name], female    DOB: 30-Jan-1946, 76 y.o.   MRN: [id number]  HPI Chief Complaint  Patient presents with   Annual Exam   She is fairly new to the practice and here for a complete physical exam.   Other providers: Dr. Melvyn Novas- pulmonologist  Dr. Fuller Plan- GI  Dr. Renaldo Reel- [facility]in [facility]  Dr. Ronita Hipps and Laurin Coder, NP- gynecologist  [facility] Dr. Jimmye Norman Dermatologist [place]  Psychiatrist - Velora Heckler   Hx of pos TB and was getting annual XR with Dr. Melvyn Novas   She has been taking Fosamax for several years, more than 5 years per patient.  Has been getting headaches with this medication and stopped it 3 weeks ago.  Recent DEXA shows osteopenia    Social history: Lives alone, widowed, retired. Pets are on medication  Denies smoking, drinking alcohol, drug use  Diet: fairly healthy. Does not cook. Peanut butter and jelly sandwiches. Eats with her family somedays, her brother cooks.  Excerise: nothing regular   Immunizations: UTD. Declines Shingrix   Health maintenance:   Mammogram: done in May 2023 and normal per patient. Done at [facility]: 12/2020 Colonoscopy: 2015 and 10 year recall  Last Gynecological Exam: 2021 at gynecologist  Last Dental Exam: regular visits. [facility]  Last Eye Exam: [facility]   Wears seatbelt always, uses sunscreen, smoke detectors in home and functioning, does not text while driving and feels safe in home environment.   Reviewed allergies, medications, past medical, surgical, family, and social history.    Review of Systems Review of Systems Constitutional: -fever, -chills, -sweats, -unexpected weight change,-fatigue ENT: -runny nose, -ear pain, -sore throat Cardiology:  -chest pain, -palpitations, -edema Respiratory: -cough, -shortness of breath, -wheezing Gastroenterology: -abdominal pain, -nausea, -vomiting, -diarrhea, -constipation  Hematology: -bleeding or bruising  problems Musculoskeletal: -arthralgias, -myalgias, -joint swelling, -back pain Ophthalmology: -vision changes Urology: -dysuria, -difficulty urinating, -hematuria, -urinary frequency, -urgency Neurology: -headache, -weakness, -tingling, -numbness       Objective:   Physical Exam BP (!) 142/74 (BP Location: Left Arm, Patient Position: Sitting, Cuff Size: Large)   Pulse 60   Temp 97.8 F (36.6 C) (Temporal)   Ht '5\' 2"'$  (1.575 m)   Wt 183 lb (83 kg)   SpO2 98%   BMI 33.47 kg/m   General Appearance:    Alert, cooperative, no distress, appears stated age  Head:    Normocephalic, without obvious abnormality, atraumatic  Eyes:    PERRL, conjunctiva/corneas clear, EOM's intact  Ears:    Normal TM's and external ear canals  Nose:   Nares normal, mucosa normal, no drainage or sinus   tenderness  Throat:   Lips, mucosa, and tongue normal; teeth and gums normal  Neck:   Supple, no lymphadenopathy;  thyroid:  no   enlargement/tenderness/nodules; no JVD  Back:    Spine nontender, no curvature, ROM normal, no CVA     tenderness  Lungs:     Clear to auscultation bilaterally without wheezes, rales or     ronchi; respirations unlabored  Chest Wall:    No tenderness or deformity   Heart:    Regular rate and rhythm, S1 and S2 normal, no murmur, rub   or gallop  Breast Exam:    gyn  Abdomen:     Soft, non-tender, nondistended, normoactive bowel sounds,    no masses, no hepatosplenomegaly  Genitalia:    gyn     Extremities:   No clubbing,  cyanosis or edema  Pulses:   2+ and symmetric all extremities  Skin:   Skin color, texture, turgor normal, no rashes or lesions  Lymph nodes:   Cervical, supraclavicular, and axillary nodes normal  Neurologic:   CNII-XII intact, normal strength, sensation and gait          Psych:   Normal mood, affect, hygiene and grooming.          Assessment & Plan:  Encounter for general adult medical examination with abnormal findings -Preventive health care reviewed.   Counseling on healthy lifestyle including diet and exercise.  Recommend regular dental and eye exams.  Discussed safety and health promotion.  Immunizations reviewed.  Osteopenia, unspecified location -Apparently she has been on alendronate for more than 5 years.  Recently causing her headaches and she would like to stop the medication which we will do.  Discussed getting 1200 mg of calcium in her diet or taking a low-dose calcium supplement, getting adequate vitamin D and weightbearing exercises.  Vitamin D deficiency - Plan: VITAMIN D 25 Hydroxy (Vit-D Deficiency, Fractures), VITAMIN D 25 Hydroxy (Vit-D Deficiency, Fractures) -Check vitamin D level and adjust supplemental dose as appropriate.  Essential hypertension - Plan: CBC with Differential/Platelet, Comprehensive metabolic panel, Comprehensive metabolic panel, CBC with Differential/Platelet -She denies being on medication for hypertension.  Blood pressures controlled at home.  She will keep an eye on her blood pressure and follow-up if it continues to be elevated.  Pure hypercholesterolemia - Plan: Lipid panel, Lipid panel -Check lipid panel and follow-up.  She is not currently on it at this statin.  RHINITIS, CHRONIC - Plan: fluticasone (FLONASE) 50 MCG/ACT nasal spray  Obesity (BMI 30.0-34.9) - Plan: TSH, Lipid panel, Lipid panel, TSH -Discussed healthy diet and exercise for weight loss.

## 2022-02-10 NOTE — Patient Instructions (Signed)
Please go to the lab on the first floor for your blood work today before you leave.  Stop alendronate and make sure you are getting 1200 mg of calcium in your diet, over-the-counter vitamin D3 800 or 1000 international units daily and plenty of weightbearing exercises such as walking.  Do brain activities as discussed.  We will be in touch with your lab results.   Preventive Care 5 Years and Older, Female Preventive care refers to lifestyle choices and visits with your health care provider that can promote health and wellness. Preventive care visits are also called wellness exams. What can I expect for my preventive care visit? Counseling Your health care provider may ask you questions about your: Medical history, including: Past medical problems. Family medical history. Pregnancy and menstrual history. History of falls. Current health, including: Memory and ability to understand (cognition). Emotional well-being. Home life and relationship well-being. Sexual activity and sexual health. Lifestyle, including: Alcohol, nicotine or tobacco, and drug use. Access to firearms. Diet, exercise, and sleep habits. Work and work Statistician. Sunscreen use. Safety issues such as seatbelt and bike helmet use. Physical exam Your health care provider will check your: Height and weight. These may be used to calculate your BMI (body mass index). BMI is a measurement that tells if you are at a healthy weight. Waist circumference. This measures the distance around your waistline. This measurement also tells if you are at a healthy weight and may help predict your risk of certain diseases, such as type 2 diabetes and high blood pressure. Heart rate and blood pressure. Body temperature. Skin for abnormal spots. What immunizations do I need?  Vaccines are usually given at various ages, according to a schedule. Your health care provider will recommend vaccines for you based on your age, medical  history, and lifestyle or other factors, such as travel or where you work. What tests do I need? Screening Your health care provider may recommend screening tests for certain conditions. This may include: Lipid and cholesterol levels. Hepatitis C test. Hepatitis B test. HIV (human immunodeficiency virus) test. STI (sexually transmitted infection) testing, if you are at risk. Lung cancer screening. Colorectal cancer screening. Diabetes screening. This is done by checking your blood sugar (glucose) after you have not eaten for a while (fasting). Mammogram. Talk with your health care provider about how often you should have regular mammograms. BRCA-related cancer screening. This may be done if you have a family history of breast, ovarian, tubal, or peritoneal cancers. Bone density scan. This is done to screen for osteoporosis. Talk with your health care provider about your test results, treatment options, and if necessary, the need for more tests. Follow these instructions at home: Eating and drinking  Eat a diet that includes fresh fruits and vegetables, whole grains, lean protein, and low-fat dairy products. Limit your intake of foods with high amounts of sugar, saturated fats, and salt. Take vitamin and mineral supplements as recommended by your health care provider. Do not drink alcohol if your health care provider tells you not to drink. If you drink alcohol: Limit how much you have to 0-1 drink a day. Know how much alcohol is in your drink. In the [place] one drink equals one 12 oz bottle of beer (355 mL), one 5 oz glass of wine (148 mL), or one 1 oz glass of hard liquor (44 mL). Lifestyle Brush your teeth every morning and night with fluoride toothpaste. Floss one time each day. Exercise for at least 30 minutes 5  or more days each week. Do not use any products that contain nicotine or tobacco. These products include cigarettes, chewing tobacco, and vaping devices, such as e-cigarettes.  If you need help quitting, ask your health care provider. Do not use drugs. If you are sexually active, practice safe sex. Use a condom or other form of protection in order to prevent STIs. Take aspirin only as told by your health care provider. Make sure that you understand how much to take and what form to take. Work with your health care provider to find out whether it is safe and beneficial for you to take aspirin daily. Ask your health care provider if you need to take a cholesterol-lowering medicine (statin). Find healthy ways to manage stress, such as: Meditation, yoga, or listening to music. Journaling. Talking to a trusted person. Spending time with friends and family. Minimize exposure to UV radiation to reduce your risk of skin cancer. Safety Always wear your seat belt while driving or riding in a vehicle. Do not drive: If you have been drinking alcohol. Do not ride with someone who has been drinking. When you are tired or distracted. While texting. If you have been using any mind-altering substances or drugs. Wear a helmet and other protective equipment during sports activities. If you have firearms in your house, make sure you follow all gun safety procedures. What's next? Visit your health care provider once a year for an annual wellness visit. Ask your health care provider how often you should have your eyes and teeth checked. Stay up to date on all vaccines. This information is not intended to replace advice given to you by your health care provider. Make sure you discuss any questions you have with your health care provider. Document Revised: 02/05/2021 Document Reviewed: 02/05/2021 Elsevier Patient Education  [place].

## 2022-02-11 ENCOUNTER — Encounter: Payer: Self-pay | Admitting: Family Medicine

## 2022-02-11 DIAGNOSIS — Z9189 Other specified personal risk factors, not elsewhere classified: Secondary | ICD-10-CM | POA: Insufficient documentation

## 2022-02-11 HISTORY — DX: Other specified personal risk factors, not elsewhere classified: Z91.89

## 2022-02-12 ENCOUNTER — Other Ambulatory Visit: Payer: Self-pay | Admitting: Family Medicine

## 2022-02-12 DIAGNOSIS — Z9189 Other specified personal risk factors, not elsewhere classified: Secondary | ICD-10-CM

## 2022-02-12 DIAGNOSIS — E7800 Pure hypercholesterolemia, unspecified: Secondary | ICD-10-CM

## 2022-02-12 MED ORDER — ATORVASTATIN CALCIUM 10 MG PO TABS: 10.0000 mg | ORAL_TABLET | Freq: Every day | ORAL | 3 refills | Status: DC

## 2022-02-12 NOTE — Telephone Encounter (Signed)
Called pt and relayed to her medication is at the pharmacy and if she has any side effects to please let us know as we can try to figure out something else for her. Pt verbalized understanding and booked f/u appt for August 3rd.

## 2022-02-12 NOTE — Telephone Encounter (Signed)
Pt viewed lab results and is in agreement with starting on cholesterol medication

## 2022-03-03 ENCOUNTER — Ambulatory Visit (HOSPITAL_COMMUNITY): Payer: PPO | Admitting: Psychiatry

## 2022-03-03 DIAGNOSIS — F325 Major depressive disorder, single episode, in full remission: Secondary | ICD-10-CM

## 2022-03-03 MED ORDER — VILAZODONE HCL 40 MG PO TABS: 40.0000 mg | ORAL_TABLET | Freq: Every day | ORAL | 7 refills | Status: DC

## 2022-03-03 MED ORDER — ALPRAZOLAM 0.25 MG PO TABS: ORAL_TABLET | ORAL | 1 refills | Status: DC

## 2022-03-03 MED ORDER — BUPROPION HCL ER (XL) 300 MG PO TB24
300.0000 mg | ORAL_TABLET | Freq: Every day | ORAL | 7 refills | Status: DC
Start: 2022-03-03 — End: 2022-08-05

## 2022-03-03 NOTE — Progress Notes (Signed)
Psychiatric Initial Adult Assessment   Patient Identification: [name] MRN:  [id number] Date of Evaluation:  03/03/2022 Referral Source: Primary care Chief Complaint: History of depression Visit Diagnosis:   History of Present Illness:  This patient is a 76 year old white window female who is here for follow-up psychiatric care.  She has been under the care of a local psychiatrist, Dr. Marcie Mowers for years.  She is diagnosed with major depression.  She takes Wellbutrin and Viibryd.  Today the patient is very stable.  The patient lives alone.  She been a widow since 2012.  She had 2 sons 1 diabetes complications a number of years ago.  The other son is 35 years of age is married and has 2 children.  The patient is a retired Facilities manager.  The patient denies any depression at this time.  She is sleeping and eating well and has good energy.  She denies problems with thinking or concentrating.  She has a good sense of work.  She is not suicidal now and she has never been suicidal.  The patient enjoys going to the beach.  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  She enjoys being with her family.  The patient's financial status is good.  She had no recent deaths.  Patient drinks no alcohol and uses no drugs.  She has never had psychosis.  5 or 10 years ago she experienced an episode of major depression with associated disturbances in sleep and energy concentration and a sense of worthlessness.  Patient has never had mania.  She denies symptoms of generalized anxiety disorder or panic disorder or obsessive-compulsive disorder.  Patient presently is in no relationships.  Her past psychiatric history is significant for no psychiatric hospitalizations.  She has seen Dr. Cheryln Manly in therapy in the past.  She been on Cymbalta in the past and now takes Xanax 0.25 mg infrequently.  She takes Wellbutrin 300 mg and Viibryd 40 mg. Her medical illnesses include hypercholesterolemia and takes Lipitor and reflux  esophagitis and takes Protonix.  At this time the patient is very stable.  Associated Signs/Symptoms: Depression Symptoms:  fatigue, (Hypo) Manic Symptoms:   Anxiety Symptoms:   Psychotic Symptoms:   PTSD Symptoms: NA  Past Psychiatric History: Past psychiatric medications and therapy  Previous Psychotropic Medications: Yes   Substance Abuse History in the last 12 months:  No.  Consequences of Substance Abuse: NA  Past Medical History:  Past Medical History:  Diagnosis Date   10 year risk of MI or stroke 7.5% or greater 02/11/2022   ASCVD 10 yr risk 22.7%  Recommend starting statin    Allergic rhinitis    Allergy    Anxiety    Anxiety and depression    Arthritis    Asthma    years ago-1993-94   Barrett's esophagus 05/2005   Blood transfusion without reported diagnosis    as baby   Cancer ([place])    Basal cell face   Cataract    bilateral removal   Depression    Diverticulosis    Esophageal stricture    Gastric polyps    GERD (gastroesophageal reflux disease)    Hiatal hernia    Hypertension    on meds   Morbid obesity (HCC)    OSA (obstructive sleep apnea)    Osteoporosis    Pneumonia    many years ago   Positive PPD    Sleep apnea    bi pap    Past Surgical History:  Procedure Laterality Date   CESAREAN SECTION     x 2   CHOLECYSTECTOMY     COLONOSCOPY     ENDOSCOPIC RETROGRADE CHOLANGIOPANCREATOGRAPHY (ERCP) WITH PROPOFOL N/A 09/17/2017   Procedure: ENDOSCOPIC RETROGRADE CHOLANGIOPANCREATOGRAPHY (ERCP) WITH PROPOFOL;  Surgeon: Ladene Artist, MD;  Location: [facility] ENDOSCOPY;  Service: Endoscopy;  Laterality: N/A;   FINGER SURGERY     cyst removel from right index finger   FOOT FRACTURE SURGERY     right    FOOT NEUROMA SURGERY     left    TONSILLECTOMY     TUBAL LIGATION  1981   UPPER GASTROINTESTINAL ENDOSCOPY     WRIST FRACTURE SURGERY     right     Family Psychiatric History:   Family History:  Family History  Problem Relation Age of  Onset   Allergies Mother    Alzheimer's disease Mother    Heart failure Mother    Arthritis Mother    Hearing loss Mother    Coronary artery disease Father    Heart failure Father    Early death Father    Heart disease Father    Hypertension Brother        x 3   Sleep apnea Brother    Heart attack Paternal Grandmother    Heart disease Paternal Grandmother    Lung cancer Paternal Grandfather    Sleep apnea Son    Diabetes Son    Colon cancer Maternal Uncle    Rectal cancer Neg Hx    Stomach cancer Neg Hx    Esophageal cancer Neg Hx    Pancreatic cancer Neg Hx    Colon polyps Neg Hx     Social History:   Social History   Socioeconomic History   Marital status: Widowed    Spouse name: Not on file   Number of children: 2   Years of education: Not on file   Highest education level: Not on file  Occupational History   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   She works as Facilities manager.   Highest level of education:  Some college   Social Determinants of Radio broadcast assistant Strain: Not on file  Food Insecurity: Not on file  Transportation Needs: Not on file  Physical Activity: Not on file  Stress: Not on file  Social Connections: Not on file    Additional Social History:   Allergies:   Allergies  Allergen Reactions   Epinephrine     REACTION: tachycardia   Other     [place] Pollen    Metabolic Disorder Labs: No results found for: "HGBA1C", "MPG" No results found for: "PROLACTIN" Lab Results  Component Value Date    CHOL 183 02/10/2022   TRIG 167.0 (H) 02/10/2022   HDL 47.30 02/10/2022   CHOLHDL 4 02/10/2022   VLDL 33.4 02/10/2022   LDLCALC 103 (H) 02/10/2022   LDLCALC 113 (H) 10/08/2020   Lab Results  Component Value Date  TSH 0.81 02/10/2022    Therapeutic Level Labs: No results found for: "LITHIUM" No results found for: "CBMZ" No results found for: "VALPROATE"  Current Medications: Current Outpatient Medications  Medication Sig Dispense Refill   acetaminophen (TYLENOL) 325 MG tablet Take 650 mg by mouth every 6 (six) hours as needed. For pain     ALPRAZolam (XANAX) 0.25 MG tablet For anxiety 1  q day 15 tablet 1   atorvastatin (LIPITOR) 10 MG tablet Take 1 tablet (10 mg total) by mouth daily. 90 tablet 3   buPROPion (WELLBUTRIN XL) 300 MG 24 hr tablet Take 1 tablet (300 mg total) by mouth daily. 30 tablet 7   Calcium Carbonate-Vitamin D 600-400 MG-UNIT per tablet Take 1 tablet by mouth daily.     dicyclomine (BENTYL) 10 MG capsule TAKE 1 CAPSULE BY MOUTH 3 TIMES DAILY AS NEEDED FOR SPASMS. 90 capsule 10   famotidine (PEPCID) 20 MG tablet Take 20 mg by mouth at bedtime as needed. For allergies     fluticasone (FLONASE) 50 MCG/ACT nasal spray 2 SPRAYS IN EACH NOSTRIL ONCE A DAY. 16 g 11   mirabegron ER (MYRBETRIQ) 25 MG TB24 tablet Take 1 tablet by mouth daily.     Multiple Vitamin (MULTIVITAMIN WITH MINERALS) TABS tablet Take 1 tablet by mouth daily.     pantoprazole (PROTONIX) 40 MG tablet TAKE 1 TABLET BY MOUTH TWICE A DAY BEFORE BREAKFAST AND DINNER 60 tablet 10   tolterodine (DETROL LA) 4 MG 24 hr capsule Take 4 mg by mouth at bedtime.     Vilazodone HCl (VIIBRYD) 40 MG TABS Take 1 tablet (40 mg total) by mouth daily. 30 tablet 7   Current Facility-Administered Medications  Medication Dose Route Frequency Provider Last Rate Last Admin   0.9 %  sodium chloride infusion  500 mL Intravenous Once Ladene Artist, MD        Musculoskeletal: Strength & Muscle Tone: within normal  limits Gait & Station: normal Patient leans: N/A  Psychiatric Specialty Exam: Review of Systems  There were no vitals taken for this visit.There is no height or weight on file to calculate BMI.  General Appearance: Casual  Eye Contact:  Good  Speech:  Clear and Coherent  Volume:  Normal  Mood:  Negative  Affect:  Appropriate  Thought Process:  Goal Directed  Orientation:  Full (Time, Place, and Person)  Thought Content:  Logical  Suicidal Thoughts:  No  Homicidal Thoughts:  No  Memory:  NA  Judgement:  Good  Insight:  Good  Psychomotor Activity:  Normal  Concentration:    Recall:  NA  Fund of Knowledge:Fair  Language: Good  Akathisia:  No  Handed:  Right  AIMS (if indicated):  not done  Assets:  Desire for Improvement  ADL's:  Intact  Cognition: WNL  Sleep:  Good   Screenings: PHQ2-9    [place] Office Visit from 02/10/2022 in [place] at [facility] Visit from 12/11/2021 in [place] at [facility]  PHQ-2 Total Score 0 0       Assessment and Plan:    At this time the patient's diagnosis is major depression in remission.  She will continue taking Wellbutrin 300 mg and Viibryd 40 mg.  She will take Xanax 0.125 on a as needed basis.  She is not in therapy at this time.  She is very stable.  She simply needs follow-up care.  She is functioning extremely well.  She is in good  spirits.  She will return to see me again in 5 months.  Collaboration of Care:   Patient/Guardian was advised Release of Information must be obtained prior to any record release in order to collaborate their care with an outside provider. Patient/Guardian was advised if they have not already done so to contact the registration department to sign all necessary forms in order for [place] to release information regarding their care.   Consent: Patient/Guardian gives verbal consent for treatment and assignment of benefits for services provided during this visit.  Patient/Guardian expressed understanding and agreed to proceed.   Jerral Ralph, MD 7/11/20233:20 PM

## 2022-03-13 ENCOUNTER — Telehealth: Payer: Self-pay | Admitting: Family Medicine

## 2022-03-13 NOTE — Telephone Encounter (Signed)
Pt states seeing GI on 04/13/22 so they can see if it is the medicine that's causing her complications. Pt stated did not take  atorvastatin (LIPITOR) 10 MG tablet,  due to diarrhea  medication caused.

## 2022-03-26 ENCOUNTER — Ambulatory Visit: Payer: PPO | Admitting: Family Medicine

## 2022-03-31 ENCOUNTER — Ambulatory Visit: Payer: PPO | Admitting: Physician Assistant

## 2022-04-02 ENCOUNTER — Ambulatory Visit: Payer: PPO | Admitting: Family Medicine

## 2022-04-06 ENCOUNTER — Ambulatory Visit (HOSPITAL_COMMUNITY)
Admission: EM | Admit: 2022-04-06 | Discharge: 2022-04-06 | Disposition: A | Discharge status: Home / Self Care | Payer: PPO | Attending: Internal Medicine | Admitting: Internal Medicine

## 2022-04-06 ENCOUNTER — Encounter (HOSPITAL_COMMUNITY): Payer: Self-pay

## 2022-04-06 DIAGNOSIS — R109 Unspecified abdominal pain: Secondary | ICD-10-CM | POA: Insufficient documentation

## 2022-04-06 LAB — CBC WITH DIFFERENTIAL/PLATELET
Abs Immature Granulocytes: 0.04 10*3/uL (ref 0.00–0.07)
Basophils Absolute: 0.1 10*3/uL (ref 0.0–0.1)
Basophils Relative: 0 %
Eosinophils Absolute: 0.1 10*3/uL (ref 0.0–0.5)
Eosinophils Relative: 1 %
HCT: 42.2 % (ref 36.0–46.0)
Hemoglobin: 13.8 g/dL (ref 12.0–15.0)
Immature Granulocytes: 0 %
Lymphocytes Relative: 12 %
Lymphs Abs: 1.7 10*3/uL (ref 0.7–4.0)
MCH: 30.1 pg (ref 26.0–34.0)
MCHC: 32.7 g/dL (ref 30.0–36.0)
MCV: 92.1 fL (ref 80.0–100.0)
Monocytes Absolute: 0.9 10*3/uL (ref 0.1–1.0)
Monocytes Relative: 6 %
Neutro Abs: 11.2 10*3/uL — ABNORMAL HIGH (ref 1.7–7.7)
Neutrophils Relative %: 81 %
Platelets: 333 10*3/uL (ref 150–400)
RBC: 4.58 MIL/uL (ref 3.87–5.11)
RDW: 13.9 % (ref 11.5–15.5)
WBC: 13.9 10*3/uL — ABNORMAL HIGH (ref 4.0–10.5)
nRBC: 0 % (ref 0.0–0.2)

## 2022-04-06 LAB — COMPREHENSIVE METABOLIC PANEL
ALT: 18 U/L (ref 0–44)
AST: 20 U/L (ref 15–41)
Albumin: 4 g/dL (ref 3.5–5.0)
Alkaline Phosphatase: 54 U/L (ref 38–126)
Anion gap: 3 — ABNORMAL LOW (ref 5–15)
BUN: 14 mg/dL (ref 8–23)
CO2: 31 mmol/L (ref 22–32)
Calcium: 9.5 mg/dL (ref 8.9–10.3)
Chloride: 107 mmol/L (ref 98–111)
Creatinine, Ser: 1.04 mg/dL — ABNORMAL HIGH (ref 0.44–1.00)
GFR, Estimated: 56 mL/min — ABNORMAL LOW (ref >60)
Glucose, Bld: 119 mg/dL — ABNORMAL HIGH (ref 70–99)
Potassium: 4.4 mmol/L (ref 3.5–5.1)
Sodium: 141 mmol/L (ref 135–145)
Total Bilirubin: 0.3 mg/dL (ref 0.3–1.2)
Total Protein: 6.9 g/dL (ref 6.5–8.1)

## 2022-04-06 MED ORDER — ONDANSETRON 4 MG PO TBDP: 4.0000 mg | ORAL_TABLET | Freq: Three times a day (TID) | ORAL | 0 refills | Status: DC | PRN

## 2022-04-06 NOTE — ED Triage Notes (Signed)
Patient having abdominal pain and emesis onset today around noon. No new foods or medication. No known sick exposure and no one with similar symptoms.  Patient has history of IBS.

## 2022-04-06 NOTE — Discharge Instructions (Signed)
Please take medications as prescribed Increase oral fluid intake We will call you with recommendations if labs are abnormal You could take medications for irritable bowel syndrome as prescribed Return to urgent care if symptoms worsen.

## 2022-04-06 NOTE — ED Provider Notes (Addendum)
[facility]    CSN: [id number] Arrival date & time: 04/06/22  1624      History   Chief Complaint Chief Complaint  Patient presents with   Abdominal Pain   Emesis    HPI [name] is a 76 y.o. female comes to the urgent care with episode of nausea, vomiting and right upper quadrant pain this morning.  Patient has a history of irritable bowel disease controlled with Bentyl.  Patient has been compliant with her medications.  She denies any fever or chills.  No abdominal distention.  No diarrhea.  No sick contacts.  No runny nose, sneezing, sore throat or cough.  No recent travel.  No changes in dietary habits.  HPI  Past Medical History:  Diagnosis Date   10 year risk of MI or stroke 7.5% or greater 02/11/2022   ASCVD 10 yr risk 22.7%  Recommend starting statin    Allergic rhinitis    Allergy    Anxiety    Anxiety and depression    Arthritis    Asthma    years ago-1993-94   Barrett's esophagus 05/2005   Blood transfusion without reported diagnosis    as baby   Cancer ([place])    Basal cell face   Cataract    bilateral removal   Depression    Diverticulosis    Esophageal stricture    Gastric polyps    GERD (gastroesophageal reflux disease)    Hiatal hernia    Hypertension    on meds   Morbid obesity (HCC)    OSA (obstructive sleep apnea)    Osteoporosis    Pneumonia    many years ago   Positive PPD    Sleep apnea    bi pap    Patient Active Problem List   Diagnosis Date Noted   10 year risk of MI or stroke 7.5% or greater 02/11/2022   Obesity (BMI 30.0-34.9) 02/10/2022   Osteoporosis 01/01/2020   Abnormal bruising 12/13/2019   Viral illness 07/27/2019   Nonspecific syndrome suggestive of viral illness 07/17/2019   Cellulitis 06/05/2019   Chronic tension headache 08/03/2018   Chronic hoarseness 05/20/2018   Biliary pain    Common bile duct stone    Elevated LFTs 09/14/2017   Epigastric pain 09/13/2017   Memory changes 03/25/2017    Venous stasis dermatitis of both lower extremities 08/20/2016   Chest pain 07/20/2016   Leg swelling 03/29/2016   Abdominal pain 03/02/2016   Shingles 04/24/2015   Special screening for malignant neoplasms, colon 12/20/2013   Health care maintenance 05/22/2013   Hypokalemia 04/12/2013   Depression 04/12/2013   Other malaise and fatigue 03/16/2013   Hip pain 07/29/2012   Neck pain 05/17/2012   Essential hypertension 01/15/2012   Osteopenia 05/25/2011   Tick bite of neck 04/07/2011   Vitamin D deficiency 06/03/2010   HYPERCHOLESTEROLEMIA 08/03/2008   DIVERTICULOSIS-COLON 02/01/2008   RHINITIS, CHRONIC 01/31/2008   Morbid obesity due to excess calories ([place]) complicated by hbp/ djd  11/03/2007   Obstructive sleep apnea 11/03/2007   GERD 11/03/2007   POSITIVE PPD 11/03/2007    Past Surgical History:  Procedure Laterality Date   CESAREAN SECTION     x 2   CHOLECYSTECTOMY     COLONOSCOPY     ENDOSCOPIC RETROGRADE CHOLANGIOPANCREATOGRAPHY (ERCP) WITH PROPOFOL N/A 09/17/2017   Procedure: ENDOSCOPIC RETROGRADE CHOLANGIOPANCREATOGRAPHY (ERCP) WITH PROPOFOL;  Surgeon: Ladene Artist, MD;  Location: [facility] ENDOSCOPY;  Service: Endoscopy;  Laterality: N/A;  FINGER SURGERY     cyst removel from right index finger   FOOT FRACTURE SURGERY     right    FOOT NEUROMA SURGERY     left    TONSILLECTOMY     TUBAL LIGATION  1981   UPPER GASTROINTESTINAL ENDOSCOPY     WRIST FRACTURE SURGERY     right     OB History   No obstetric history on file.      Home Medications    Prior to Admission medications   Medication Sig Start Date End Date Taking? Authorizing Provider  ALPRAZolam Duanne Moron) 0.25 MG tablet For anxiety 1  q day 03/03/22  Yes Plovsky, Berneta Sages, MD  buPROPion (WELLBUTRIN XL) 300 MG 24 hr tablet Take 1 tablet (300 mg total) by mouth daily. 03/03/22  Yes Plovsky, Berneta Sages, MD  Calcium Carbonate-Vitamin D 600-400 MG-UNIT per tablet Take 1 tablet by mouth daily.   Yes [provider]  dicyclomine (BENTYL) 10 MG capsule TAKE 1 CAPSULE BY MOUTH 3 TIMES DAILY AS NEEDED FOR SPASMS. 10/20/21  Yes Ladene Artist, MD  famotidine (PEPCID) 20 MG tablet Take 20 mg by mouth at bedtime as needed. For allergies   Yes [provider]  fluticasone (FLONASE) 50 MCG/ACT nasal spray 2 SPRAYS IN EACH NOSTRIL ONCE A DAY. 02/10/22  Yes Henson, Vickie L, NP-C  mirabegron ER (MYRBETRIQ) 25 MG TB24 tablet Take 1 tablet by mouth daily.   Yes [provider]  Multiple Vitamin (MULTIVITAMIN WITH MINERALS) TABS tablet Take 1 tablet by mouth daily.   Yes [provider]  ondansetron (ZOFRAN-ODT) 4 MG disintegrating tablet Take 1 tablet (4 mg total) by mouth every 8 (eight) hours as needed for nausea or vomiting. 04/06/22  Yes [place] Searles, Myrene Galas, MD  pantoprazole (PROTONIX) 40 MG tablet TAKE 1 TABLET BY MOUTH TWICE A DAY BEFORE BREAKFAST AND DINNER 10/20/21  Yes Ladene Artist, MD  tolterodine (DETROL LA) 4 MG 24 hr capsule Take 4 mg by mouth at bedtime.   Yes [provider]  Vilazodone HCl (VIIBRYD) 40 MG TABS Take 1 tablet (40 mg total) by mouth daily. 03/03/22  Yes Plovsky, Berneta Sages, MD  acetaminophen (TYLENOL) 325 MG tablet Take 650 mg by mouth every 6 (six) hours as needed. For pain    [provider]    Family History Family History  Problem Relation Age of Onset   Allergies Mother    Alzheimer's disease Mother    Heart failure Mother    Arthritis Mother    Hearing loss Mother    Coronary artery disease Father    Heart failure Father    Early death Father    Heart disease Father    Hypertension Brother        x 3   Sleep apnea Brother    Heart attack Paternal Grandmother    Heart disease Paternal Grandmother    Lung cancer Paternal Grandfather    Sleep apnea Son    Diabetes Son    Colon cancer Maternal Uncle    Rectal cancer Neg Hx    Stomach cancer Neg Hx    Esophageal cancer Neg Hx    Pancreatic cancer Neg Hx    Colon  polyps Neg Hx     Social History Social History   Tobacco Use   Smoking status: Never   Smokeless tobacco: Never   Tobacco comments:    pt has never smoked but husband smoked  Vaping Use   Vaping  Use: Never used  Substance Use Topics   Alcohol use: Not Currently   Drug use: Never     Allergies   Epinephrine and Other   Review of Systems Review of Systems  Constitutional: Negative.   HENT: Negative.    Cardiovascular: Negative.   Gastrointestinal:  Positive for abdominal pain, nausea and vomiting.  Musculoskeletal: Negative.   Skin: Negative.   Neurological: Negative.      Physical Exam Triage Vital Signs ED Triage Vitals  Enc Vitals Group     BP 04/06/22 1636 (!) 169/76     Pulse Rate 04/06/22 1636 73     Resp 04/06/22 1636 16     Temp 04/06/22 1636 98 F (36.7 C)     Temp Source 04/06/22 1636 Oral     SpO2 04/06/22 1636 96 %     Weight 04/06/22 1640 180 lb (81.6 kg)     Height 04/06/22 1640 '5\' 2"'$  (1.575 m)     Head Circumference --      Peak Flow --      Pain Score 04/06/22 1640 7     Pain Loc --      Pain Edu? --      Excl. in [place]? --    No data found.  Updated Vital Signs BP (!) 169/76 (BP Location: Right Arm)   Pulse 73   Temp 98 F (36.7 C) (Oral)   Resp 16   Ht '5\' 2"'$  (1.575 m)   Wt 81.6 kg   SpO2 96%   BMI 32.92 kg/m   Visual Acuity Right Eye Distance:   Left Eye Distance:   Bilateral Distance:    Right Eye Near:   Left Eye Near:    Bilateral Near:     Physical Exam Vitals and nursing note reviewed.  Constitutional:      General: She is not in acute distress.    Appearance: She is not ill-appearing.  HENT:     Head: Normocephalic.  Cardiovascular:     Rate and Rhythm: Normal rate and regular rhythm.  Abdominal:     General: Bowel sounds are increased.     Palpations: Abdomen is soft. There is no hepatomegaly or splenomegaly.     Tenderness: There is no abdominal tenderness. There is no guarding or rebound. Negative signs  include Murphy's sign and McBurney's sign.  Neurological:     Mental Status: She is alert.      UC Treatments / Results  Labs (all labs ordered are listed, but only abnormal results are displayed) Labs Reviewed  COMPREHENSIVE METABOLIC PANEL  CBC WITH DIFFERENTIAL/PLATELET    EKG   Radiology No results found.  Procedures Procedures (including critical care time)  Medications Ordered in UC Medications - No data to display  Initial Impression / Assessment and Plan / UC Course  I have reviewed the triage vital signs and the nursing notes.  Pertinent labs & imaging results that were available during my care of the patient were reviewed by me and considered in my medical decision making (see chart for details).       1.  Abdominal pain with nausea and vomiting: CBC, CMP Zofran as needed for nausea/vomiting Maintain adequate hydration We will call patient with recommendations if labs are abnormal. Abdominal exam was benign. Return precautions given. Final Clinical Impressions(s) / UC Diagnoses   Final diagnoses:  Abdominal pain, unspecified abdominal location     Discharge Instructions      Please take medications as prescribed  Increase oral fluid intake We will call you with recommendations if labs are abnormal You could take medications for irritable bowel syndrome as prescribed Return to urgent care if symptoms worsen.   ED Prescriptions     Medication Sig Dispense Auth. Provider   ondansetron (ZOFRAN-ODT) 4 MG disintegrating tablet Take 1 tablet (4 mg total) by mouth every 8 (eight) hours as needed for nausea or vomiting. 20 tablet Anabia Weatherwax, Myrene Galas, MD      PDMP not reviewed this encounter.   Chase Picket, MD 04/06/22 1751    Chase Picket, MD 04/06/22 [phone]

## 2022-04-07 ENCOUNTER — Telehealth: Payer: Self-pay | Admitting: Family Medicine

## 2022-04-07 NOTE — Telephone Encounter (Signed)
Called pt and relayed results that were written by RN. Pt verbalized understanding.

## 2022-04-07 NOTE — Progress Notes (Signed)
04/13/2022 [name] [id number] 07-21-1946  Referring provider: Girtha Rm, NP-C Primary GI doctor: Dr. Fuller Plan  ASSESSMENT AND PLAN:   Assessment: 76 y.o. female here for assessment of the following: 1. Diverticulosis of colon   2. Irritable bowel syndrome with diarrhea   3. Gastroesophageal reflux disease, unspecified whether esophagitis present   4. History of colonic polyps    Recent episode of AB pain with diarrhea, malaise, nausea and vomiting, has improved Baseline IBS-D 2015 colonoscopy mild left-sided diverticulosis no polyps 10-year follow-up.  Plan: Will check labs for inflammation, infection, if elevated can consider treating for diverticulitis/CT AB and pelvis.  Will do trial of colestipol Due for recall colonoscopy 2025  Orders Placed This Encounter  Procedures   CBC with Differential/Platelet   Comprehensive metabolic panel   Sedimentation rate   High sensitivity CRP    Meds ordered this encounter  Medications   colestipol (COLESTID) 1 g tablet    Sig: Take 1 tablet (1 g total) by mouth 2 (two) times daily.    Dispense:  60 tablet    Refill:  0    History of Present Illness:  76 y.o. female  with a past medical history of hypertension, OSA, osteoporosis, morbid obesity elevated liver function, GERD, diverticulosis, previous ERCP status post cholecystectomy, bile duct stone 2019 and others listed below, returns to clinic today for evaluation of abdominal pain and diarrhea.  10/16/2021 office visit Dr. Fuller Plan for worsening epigastric pain and diarrhea history of IBS and GERD.   Bentyl and Imodium help control symptoms. 05/09/2021 CT abdomen pelvis with contrast mild biliary dilatation status post choley within limits, distal colonic diverticulosis with mild wall thickening but no surrounding inflammation 05/26/2021 MRCP bile ducts are within normal limits postcholecystectomy mild central intrahepatic biliary ductal dilatation with smooth distal  tapering no evidence of later cholelithiasis, mild diffuse hepatic steatosis, 0.6 cm right liver lesion benign favoring hemangioma. 07/07/2021 EGD Dr. Fuller Plan for epigastric pain and nausea normal esophagus, multiple gastric polyps, erythematous mucosa in gastric body, small hiatal hernia, nonbleeding duodenal diverticulum. Pathology showed benign gastric polyps, negative for dysplasia.  Gastritis negative for H. pylori or intestinal metaplasia. 2015 colonoscopy mild left-sided diverticulosis no polyps 10-year follow-up. Due 2025  States every she eats gives her diarrhea, bentyl and imodium helps, will take that if she goes anywhere.  She had lower right Ab pain about a week, went to UC, told dehydrated, had intermittent lower AB pain for 3-4 days.  She had nausea, vomiting x 2, diarrhea 3-4 x a day, having chills no fever, with fatigue.  She did have a leukocytosis. She normally has urgency with diarrhea up to 2 x a day, AB pain better after BM.  Not associated with food.  No hematochezia or melena.   She  reports that she has never smoked. She has never used smokeless tobacco. She reports that she does not currently use alcohol. She reports that she does not use drugs. Her family history includes Allergies in her mother; Alzheimer's disease in her mother; Arthritis in her mother; Colon cancer in her maternal uncle; Coronary artery disease in her father; Diabetes in her son; Early death in her father; Hearing loss in her mother; Heart attack in her paternal grandmother; Heart disease in her father and paternal grandmother; Heart failure in her father and mother; Hypertension in her brother; Lung cancer in her paternal grandfather; Sleep apnea in her brother and son.   Current Medications:     Current  Outpatient Medications (Cardiovascular):    colestipol (COLESTID) 1 g tablet, Take 1 tablet (1 g total) by mouth 2 (two) times daily.   Current Outpatient Medications (Respiratory):     fluticasone (FLONASE) 50 MCG/ACT nasal spray, 2 SPRAYS IN EACH NOSTRIL ONCE A DAY.   Current Outpatient Medications (Analgesics):    acetaminophen (TYLENOL) 325 MG tablet, Take 650 mg by mouth every 6 (six) hours as needed. For pain     Current Outpatient Medications (Other):    ALPRAZolam (XANAX) 0.25 MG tablet, For anxiety 1  q day   buPROPion (WELLBUTRIN XL) 300 MG 24 hr tablet, Take 1 tablet (300 mg total) by mouth daily.   Calcium Carbonate-Vitamin D 600-400 MG-UNIT per tablet, Take 1 tablet by mouth daily.   dicyclomine (BENTYL) 10 MG capsule, TAKE 1 CAPSULE BY MOUTH 3 TIMES DAILY AS NEEDED FOR SPASMS.   famotidine (PEPCID) 20 MG tablet, Take 20 mg by mouth at bedtime as needed. For allergies   Multiple Vitamin (MULTIVITAMIN WITH MINERALS) TABS tablet, Take 1 tablet by mouth daily.   MYRBETRIQ 50 MG TB24 tablet, Take 50 mg by mouth daily.   ondansetron (ZOFRAN-ODT) 4 MG disintegrating tablet, Take 1 tablet (4 mg total) by mouth every 8 (eight) hours as needed for nausea or vomiting.   pantoprazole (PROTONIX) 40 MG tablet, TAKE 1 TABLET BY MOUTH TWICE A DAY BEFORE BREAKFAST AND DINNER   tolterodine (DETROL LA) 4 MG 24 hr capsule, Take 4 mg by mouth at bedtime.   Vilazodone HCl (VIIBRYD) 40 MG TABS, Take 1 tablet (40 mg total) by mouth daily.  Current Facility-Administered Medications (Other):    0.9 %  sodium chloride infusion  Surgical History:  She  has a past surgical history that includes Cesarean section; Cholecystectomy; Foot fracture surgery; Foot neuroma surgery; Tubal ligation (1981); Wrist fracture surgery; Finger surgery; Tonsillectomy; Upper gastrointestinal endoscopy; Colonoscopy; and Endoscopic retrograde cholangiopancreatography (ercp) with propofol (N/A, 09/17/2017).  Current Medications, Allergies, Past Medical History, Past Surgical History, Family History and Social History were reviewed in [employer] record.  Physical Exam: BP 130/60 (BP  Location: Left Arm, Patient Position: Sitting, Cuff Size: Normal)   Pulse 60   Ht '5\' 1"'$  (1.549 m) Comment: height measured without shoes  Wt 182 lb 6 oz (82.7 kg)   BMI 34.46 kg/m  General:   Pleasant, well developed female in no acute distress Heart : Regular rate and rhythm; no murmurs Pulm: Clear anteriorly; no wheezing Abdomen:  Soft, Obese AB, Active bowel sounds. No tenderness . Without guarding and Without rebound, No organomegaly appreciated. Rectal: Not evaluated Extremities:  without  edema. Neurologic:  Alert and  oriented x4;  No focal deficits.  Psych:  Cooperative. Normal mood and affect.   Vladimir Crofts, PA-C 04/13/22

## 2022-04-07 NOTE — Telephone Encounter (Signed)
Patient went to Urgent Care on 04/06/2022.  She would like someone to call her to discuss the lab results that are in her my chart from this visit.

## 2022-04-09 ENCOUNTER — Ambulatory Visit: Payer: PPO | Admitting: Family Medicine

## 2022-04-10 ENCOUNTER — Ambulatory Visit: Payer: PPO | Admitting: Family Medicine

## 2022-04-13 ENCOUNTER — Encounter: Payer: Self-pay | Admitting: Physician Assistant

## 2022-04-13 ENCOUNTER — Other Ambulatory Visit (INDEPENDENT_AMBULATORY_CARE_PROVIDER_SITE_OTHER): Payer: PPO

## 2022-04-13 ENCOUNTER — Telehealth: Payer: Self-pay | Admitting: Physician Assistant

## 2022-04-13 ENCOUNTER — Ambulatory Visit: Payer: PPO | Admitting: Physician Assistant

## 2022-04-13 VITALS — BP 130/60 | HR 60 | Ht 61.0 in | Wt 182.4 lb

## 2022-04-13 DIAGNOSIS — K580 Irritable bowel syndrome with diarrhea: Secondary | ICD-10-CM

## 2022-04-13 DIAGNOSIS — K219 Gastro-esophageal reflux disease without esophagitis: Secondary | ICD-10-CM | POA: Diagnosis not present

## 2022-04-13 DIAGNOSIS — Z86010 Personal history of colonic polyps: Secondary | ICD-10-CM

## 2022-04-13 DIAGNOSIS — K5730 Diverticulosis of large intestine without perforation or abscess without bleeding: Secondary | ICD-10-CM

## 2022-04-13 LAB — CBC WITH DIFFERENTIAL/PLATELET
Basophils Absolute: 0.1 10*3/uL (ref 0.0–0.1)
Basophils Relative: 0.9 % (ref 0.0–3.0)
Eosinophils Absolute: 0.2 10*3/uL (ref 0.0–0.7)
Eosinophils Relative: 2.1 % (ref 0.0–5.0)
HCT: 38.1 % (ref 36.0–46.0)
Hemoglobin: 12.9 g/dL (ref 12.0–15.0)
Lymphocytes Relative: 33.4 % (ref 12.0–46.0)
Lymphs Abs: 2.4 10*3/uL (ref 0.7–4.0)
MCHC: 33.7 g/dL (ref 30.0–36.0)
MCV: 89.8 fl (ref 78.0–100.0)
Monocytes Absolute: 0.6 10*3/uL (ref 0.1–1.0)
Monocytes Relative: 9.1 % (ref 3.0–12.0)
Neutro Abs: 3.9 10*3/uL (ref 1.4–7.7)
Neutrophils Relative %: 54.5 % (ref 43.0–77.0)
Platelets: 317 10*3/uL (ref 150.0–400.0)
RBC: 4.24 Mil/uL (ref 3.87–5.11)
RDW: 14.4 % (ref 11.5–15.5)
WBC: 7.1 10*3/uL (ref 4.0–10.5)

## 2022-04-13 LAB — COMPREHENSIVE METABOLIC PANEL
ALT: 20 U/L (ref 0–35)
AST: 15 U/L (ref 0–37)
Albumin: 4.1 g/dL (ref 3.5–5.2)
Alkaline Phosphatase: 50 U/L (ref 39–117)
BUN: 9 mg/dL (ref 6–23)
CO2: 29 mEq/L (ref 19–32)
Calcium: 9.5 mg/dL (ref 8.4–10.5)
Chloride: 104 mEq/L (ref 96–112)
Creatinine, Ser: 0.75 mg/dL (ref 0.40–1.20)
GFR: 78.53 mL/min (ref >60.00)
Glucose, Bld: 91 mg/dL (ref 70–99)
Potassium: 3.7 mEq/L (ref 3.5–5.1)
Sodium: 143 mEq/L (ref 135–145)
Total Bilirubin: 0.3 mg/dL (ref 0.2–1.2)
Total Protein: 6.7 g/dL (ref 6.0–8.3)

## 2022-04-13 LAB — HIGH SENSITIVITY CRP: CRP, High Sensitivity: 2.22 mg/L (ref 0.000–5.000)

## 2022-04-13 LAB — SEDIMENTATION RATE: Sed Rate: 25 mm/hr (ref 0–30)

## 2022-04-13 MED ORDER — COLESTIPOL HCL 1 G PO TABS: 1.0000 g | ORAL_TABLET | Freq: Two times a day (BID) | ORAL | 0 refills | Status: DC

## 2022-04-13 NOTE — Telephone Encounter (Signed)
Patent called back said she is taking Myrbetriq 50 mg wanted to let [facility] know.

## 2022-04-13 NOTE — Patient Instructions (Addendum)
Your provider has requested that you go to the basement level for lab work before leaving today. Press "B" on the elevator. The lab is located at the first door on the left as you exit the elevator.   Diverticulosis Diverticulosis is a condition that develops when small pouches (diverticula) form in the wall of the large intestine (colon). The colon is where water is absorbed and stool (feces) is formed. The pouches form when the inside layer of the colon pushes through weak spots in the outer layers of the colon. You may have a few pouches or many of them. The pouches usually do not cause problems unless they become inflamed or infected. When this happens, the condition is called diverticulitis- this is left lower quadrant pain, diarrhea, fever, chills, nausea or vomiting.  If this occurs please call the office or go to the hospital. Sometimes these patches without inflammation can also have painless bleeding associated with them, if this happens please call the office or go to the hospital. Preventing constipation and increasing fiber can help reduce diverticula and prevent complications. Even if you feel you have a high-fiber diet, suggest getting on Benefiber or Cirtracel 2 times daily. First do a trial off milk/lactose products if you use them.  Add fiber like benefiber or citracel once a day  Please try low FODMAP diet- see below- start with just one column at a time.   FODMAP stands for fermentable oligo-, di-, mono-saccharides and polyols (1). These are the scientific terms used to classify groups of carbs that are notorious for triggering digestive symptoms like bloating, gas and stomach pain.

## 2022-04-24 ENCOUNTER — Encounter: Payer: Self-pay | Admitting: Family Medicine

## 2022-04-24 ENCOUNTER — Other Ambulatory Visit: Payer: Self-pay

## 2022-05-05 ENCOUNTER — Ambulatory Visit: Payer: PPO

## 2022-05-19 ENCOUNTER — Ambulatory Visit (INDEPENDENT_AMBULATORY_CARE_PROVIDER_SITE_OTHER): Payer: PPO

## 2022-05-19 DIAGNOSIS — Z23 Encounter for immunization: Secondary | ICD-10-CM

## 2022-06-25 ENCOUNTER — Ambulatory Visit (INDEPENDENT_AMBULATORY_CARE_PROVIDER_SITE_OTHER): Payer: PPO

## 2022-06-25 VITALS — Ht 61.0 in

## 2022-06-25 DIAGNOSIS — Z0000 Encounter for general adult medical examination without abnormal findings: Secondary | ICD-10-CM | POA: Diagnosis not present

## 2022-06-25 NOTE — Patient Instructions (Signed)
[name] , Thank you for taking time to come for your Medicare Wellness Visit. I appreciate your ongoing commitment to your health goals. Please review the following plan we discussed and let me know if I can assist you in the future.   These are the goals we discussed:  Goals      My goal for 2024 is to lose some weight.  I would like to ose 20 pounds.        This is a list of the screening recommended for you and due dates:  Health Maintenance  Topic Date Due   COVID-19 Vaccine (3 - Pfizer series) 04/10/2020   Medicare Annual Wellness Visit  06/26/2023   Mammogram  01/28/2024   Colon Cancer Screening  07/09/2024   Pneumonia Vaccine  Completed   Flu Shot  Completed   DEXA scan (bone density measurement)  Completed   Hepatitis C Screening: USPSTF Recommendation to screen - Ages 50-79 yo.  Completed   HPV Vaccine  Aged Out   Tetanus Vaccine  Discontinued   Zoster (Shingles) Vaccine  Discontinued    Advanced directives: Yes; Please bring a copy of your health care power of attorney and living will to the office at your convenience.  Conditions/risks identified: Yes  Next appointment: Follow up in one year for your annual wellness visit.   Preventive Care 75 Years and Older, Female Preventive care refers to lifestyle choices and visits with your health care provider that can promote health and wellness. What does preventive care include? A yearly physical exam. This is also called an annual well check. Dental exams once or twice a year. Routine eye exams. Ask your health care provider how often you should have your eyes checked. Personal lifestyle choices, including: Daily care of your teeth and gums. Regular physical activity. Eating a healthy diet. Avoiding tobacco and drug use. Limiting alcohol use. Practicing safe sex. Taking low-dose aspirin every day. Taking vitamin and mineral supplements as recommended by your health care provider. What happens during an annual  well check? The services and screenings done by your health care provider during your annual well check will depend on your age, overall health, lifestyle risk factors, and family history of disease. Counseling  Your health care provider may ask you questions about your: Alcohol use. Tobacco use. Drug use. Emotional well-being. Home and relationship well-being. Sexual activity. Eating habits. History of falls. Memory and ability to understand (cognition). Work and work Statistician. Reproductive health. Screening  You may have the following tests or measurements: Height, weight, and BMI. Blood pressure. Lipid and cholesterol levels. These may be checked every 5 years, or more frequently if you are over 25 years old. Skin check. Lung cancer screening. You may have this screening every year starting at age 6 if you have a 30-pack-year history of smoking and currently smoke or have quit within the past 15 years. Fecal occult blood test (FOBT) of the stool. You may have this test every year starting at age 66. Flexible sigmoidoscopy or colonoscopy. You may have a sigmoidoscopy every 5 years or a colonoscopy every 10 years starting at age 31. Hepatitis C blood test. Hepatitis B blood test. Sexually transmitted disease (STD) testing. Diabetes screening. This is done by checking your blood sugar (glucose) after you have not eaten for a while (fasting). You may have this done every 1-3 years. Bone density scan. This is done to screen for osteoporosis. You may have this done starting at age 59. Mammogram. This  may be done every 1-2 years. Talk to your health care provider about how often you should have regular mammograms. Talk with your health care provider about your test results, treatment options, and if necessary, the need for more tests. Vaccines  Your health care provider may recommend certain vaccines, such as: Influenza vaccine. This is recommended every year. Tetanus, diphtheria,  and acellular pertussis (Tdap, Td) vaccine. You may need a Td booster every 10 years. Zoster vaccine. You may need this after age 20. Pneumococcal 13-valent conjugate (PCV13) vaccine. One dose is recommended after age 75. Pneumococcal polysaccharide (PPSV23) vaccine. One dose is recommended after age 23. Talk to your health care provider about which screenings and vaccines you need and how often you need them. This information is not intended to replace advice given to you by your health care provider. Make sure you discuss any questions you have with your health care provider. Document Released: 09/06/2015 Document Revised: 04/29/2016 Document Reviewed: 06/11/2015 Elsevier Interactive Patient Education  2017 [place] Prevention in the Home Falls can cause injuries. They can happen to people of all ages. There are many things you can do to make your home safe and to help prevent falls. What can I do on the outside of my home? Regularly fix the edges of walkways and driveways and fix any cracks. Remove anything that might make you trip as you walk through a door, such as a raised step or threshold. Trim any bushes or trees on the path to your home. Use bright outdoor lighting. Clear any walking paths of anything that might make someone trip, such as rocks or tools. Regularly check to see if handrails are loose or broken. Make sure that both sides of any steps have handrails. Any raised decks and porches should have guardrails on the edges. Have any leaves, snow, or ice cleared regularly. Use sand or salt on walking paths during winter. Clean up any spills in your garage right away. This includes oil or grease spills. What can I do in the bathroom? Use night lights. Install grab bars by the toilet and in the tub and shower. Do not use towel bars as grab bars. Use non-skid mats or decals in the tub or shower. If you need to sit down in the shower, use a plastic, non-slip  stool. Keep the floor dry. Clean up any water that spills on the floor as soon as it happens. Remove soap buildup in the tub or shower regularly. Attach bath mats securely with double-sided non-slip rug tape. Do not have throw rugs and other things on the floor that can make you trip. What can I do in the bedroom? Use night lights. Make sure that you have a light by your bed that is easy to reach. Do not use any sheets or blankets that are too big for your bed. They should not hang down onto the floor. Have a firm chair that has side arms. You can use this for support while you get dressed. Do not have throw rugs and other things on the floor that can make you trip. What can I do in the kitchen? Clean up any spills right away. Avoid walking on wet floors. Keep items that you use a lot in easy-to-reach places. If you need to reach something above you, use a strong step stool that has a grab bar. Keep electrical cords out of the way. Do not use floor polish or wax that makes floors slippery. If you must  use wax, use non-skid floor wax. Do not have throw rugs and other things on the floor that can make you trip. What can I do with my stairs? Do not leave any items on the stairs. Make sure that there are handrails on both sides of the stairs and use them. Fix handrails that are broken or loose. Make sure that handrails are as long as the stairways. Check any carpeting to make sure that it is firmly attached to the stairs. Fix any carpet that is loose or worn. Avoid having throw rugs at the top or bottom of the stairs. If you do have throw rugs, attach them to the floor with carpet tape. Make sure that you have a light switch at the top of the stairs and the bottom of the stairs. If you do not have them, ask someone to add them for you. What else can I do to help prevent falls? Wear shoes that: Do not have high heels. Have rubber bottoms. Are comfortable and fit you well. Are closed at the  toe. Do not wear sandals. If you use a stepladder: Make sure that it is fully opened. Do not climb a closed stepladder. Make sure that both sides of the stepladder are locked into place. Ask someone to hold it for you, if possible. Clearly mark and make sure that you can see: Any grab bars or handrails. First and last steps. Where the edge of each step is. Use tools that help you move around (mobility aids) if they are needed. These include: Canes. Walkers. Scooters. Crutches. Turn on the lights when you go into a dark area. Replace any light bulbs as soon as they burn out. Set up your furniture so you have a clear path. Avoid moving your furniture around. If any of your floors are uneven, fix them. If there are any pets around you, be aware of where they are. Review your medicines with your doctor. Some medicines can make you feel dizzy. This can increase your chance of falling. Ask your doctor what other things that you can do to help prevent falls. This information is not intended to replace advice given to you by your health care provider. Make sure you discuss any questions you have with your health care provider. Document Released: 06/06/2009 Document Revised: 01/16/2016 Document Reviewed: 09/14/2014 Elsevier Interactive Patient Education  2017 [employer].

## 2022-06-25 NOTE — Progress Notes (Addendum)
Virtual Visit via Telephone Note  I connected with  [name] on 06/25/22 at  3:45 PM EDT by telephone and verified that I am speaking with the correct person using two identifiers.  Location: Patient: Home Provider: [place] Persons participating in the virtual visit: [place]   I discussed the limitations, risks, security and privacy concerns of performing an evaluation and management service by telephone and the availability of in person appointments. The patient expressed understanding and agreed to proceed.  Interactive audio and video telecommunications were attempted between this nurse and patient, however failed, due to patient having technical difficulties OR patient did not have access to video capability.  We continued and completed visit with audio only.  Some vital signs may be absent or patient reported.   Sheral Flow, LPN  Subjective:   [name] is a 76 y.o. female who presents for an Initial Medicare Annual Wellness Visit.  Review of Systems     Cardiac Risk Factors include: advanced age (>84mn, >>82women);dyslipidemia;family history of premature cardiovascular disease;obesity (BMI >30kg/m2)     Objective:    Today's Vitals   06/25/22 1559  Height: '5\' 1"'$  (1.549 m)  PainSc: 0-No pain   Body mass index is 34.46 kg/m.     06/25/2022    3:51 PM 01/20/2019   10:01 AM 09/12/2017   10:34 PM 05/15/2015    8:54 PM 06/25/2014    3:34 PM  Advanced Directives  Does Patient Have a Medical Advance Directive? Yes Yes No No Yes  Type of AParamed[place]of [place] will   [place] will  Does patient want to make changes to medical advance directive?  No - Patient declined     Copy of [place] Chart? No - copy requested      Would patient like information on creating a medical advance directive?   No - Patient  declined No - patient declined information     Current Medications (verified) Outpatient Encounter Medications as of 06/25/2022  Medication Sig   acetaminophen (TYLENOL) 325 MG tablet Take 650 mg by mouth every 6 (six) hours as needed. For pain   ALPRAZolam (XANAX) 0.25 MG tablet For anxiety 1  q day   buPROPion (WELLBUTRIN XL) 300 MG 24 hr tablet Take 1 tablet (300 mg total) by mouth daily.   Calcium Carbonate-Vitamin D 600-400 MG-UNIT per tablet Take 1 tablet by mouth daily.   colestipol (COLESTID) 1 g tablet Take 1 tablet (1 g total) by mouth 2 (two) times daily.   dicyclomine (BENTYL) 10 MG capsule TAKE 1 CAPSULE BY MOUTH 3 TIMES DAILY AS NEEDED FOR SPASMS.   famotidine (PEPCID) 20 MG tablet Take 20 mg by mouth at bedtime as needed. For allergies   fluticasone (FLONASE) 50 MCG/ACT nasal spray 2 SPRAYS IN EACH NOSTRIL ONCE A DAY.   Multiple Vitamin (MULTIVITAMIN WITH MINERALS) TABS tablet Take 1 tablet by mouth daily.   MYRBETRIQ 50 MG TB24 tablet Take 50 mg by mouth daily.   pantoprazole (PROTONIX) 40 MG tablet TAKE 1 TABLET BY MOUTH TWICE A DAY BEFORE BREAKFAST AND DINNER   tolterodine (DETROL LA) 4 MG 24 hr capsule Take 4 mg by mouth at bedtime.   Vilazodone HCl (VIIBRYD) 40 MG TABS Take 1 tablet (40 mg total) by mouth daily.   Facility-Administered Encounter Medications as of 06/25/2022  Medication   0.9 %  sodium chloride  infusion    Allergies (verified) Molds & smuts, Epinephrine, and Other   History: Past Medical History:  Diagnosis Date   10 year risk of MI or stroke 7.5% or greater 02/11/2022   ASCVD 10 yr risk 22.7%  Recommend starting statin    Allergic rhinitis    Allergy    Anxiety    Anxiety and depression    Arthritis    Asthma    years ago-1993-94   Barrett's esophagus 05/2005   Blood transfusion without reported diagnosis    as baby   Cancer ([place])    Basal cell face   Cataract    bilateral removal   Depression    Diverticulosis    Esophageal stricture     Gastric polyps    GERD (gastroesophageal reflux disease)    Hiatal hernia    Hypertension    on meds   Morbid obesity (HCC)    OSA (obstructive sleep apnea)    Osteoporosis    Pneumonia    many years ago   Positive PPD    Sleep apnea    bi pap   Past Surgical History:  Procedure Laterality Date   CESAREAN SECTION     x 2   CHOLECYSTECTOMY     COLONOSCOPY     ENDOSCOPIC RETROGRADE CHOLANGIOPANCREATOGRAPHY (ERCP) WITH PROPOFOL N/A 09/17/2017   Procedure: ENDOSCOPIC RETROGRADE CHOLANGIOPANCREATOGRAPHY (ERCP) WITH PROPOFOL;  Surgeon: Ladene Artist, MD;  Location[facility];  Service: Endoscopy;  Laterality: N/A;   FINGER SURGERY     cyst removel from right index finger   FOOT FRACTURE SURGERY     right    FOOT NEUROMA SURGERY     left    TONSILLECTOMY     TUBAL LIGATION  1981   UPPER GASTROINTESTINAL ENDOSCOPY     WRIST FRACTURE SURGERY     right    Family History  Problem Relation Age of Onset   Allergies Mother    Alzheimer's disease Mother    Heart failure Mother    Arthritis Mother    Hearing loss Mother    Coronary artery disease Father    Heart failure Father    Early death Father    Heart disease Father    Hypertension Brother        x 3   Sleep apnea Brother    Heart attack Paternal Grandmother    Heart disease Paternal Grandmother    Lung cancer Paternal Grandfather    Sleep apnea Son    Diabetes Son    Colon cancer Maternal Uncle    Rectal cancer Neg Hx    Stomach cancer Neg Hx    Esophageal cancer Neg Hx    Pancreatic cancer Neg Hx    Colon polyps Neg Hx    Social History   Socioeconomic History   Marital status: Widowed    Spouse name: Not on file   Number of children: 2   Years of education: Not on file   Highest education level: Not on file  Occupational History   Occupation: Writer: OTHER    Comment: [employer] in [place] Use   Smoking status: Never   Smokeless tobacco: Never   Tobacco  comments:    pt has never smoked but husband smoked  Vaping Use   Vaping Use: Never used  Substance and Sexual Activity   Alcohol use: Not Currently   Drug use: Never   Sexual activity: Not Currently  Other Topics Concern   Not on file  Social History Narrative   She lives alone (widowed).   She works as Facilities manager.   Highest level of education:  Some college   Social Determinants of Health   Financial Resource Strain: [place]  (06/25/2022)   Overall Financial Resource Strain (CARDIA)    Difficulty of Paying Living Expenses: Not hard at all  Food Insecurity: No Food Insecurity (06/25/2022)   Hunger Vital Sign    Worried About Running Out of Food in the Last Year: Never true    Ran Out of Food in the Last Year: Never true  Transportation Needs: No Transportation Needs (06/25/2022)   PRAPARE - Hydrologist (Medical): No    Lack of Transportation (Non-Medical): No  Physical Activity: Sufficiently Active (06/25/2022)   Exercise Vital Sign    Days of Exercise per Week: 5 days    Minutes of Exercise per Session: 30 min  Stress: No Stress Concern Present (06/25/2022)   [place]    Feeling of Stress : Not at all  Social Connections: Moderately Integrated (06/25/2022)   Social Connection and Isolation Panel [NHANES]    Frequency of Communication with Friends and Family: More than three times a week    Frequency of Social Gatherings with Friends and Family: More than three times a week    Attends Religious Services: More than 4 times per year    Active Member of [employer] or Organizations: Yes    Attends Archivist Meetings: More than 4 times per year    Marital Status: Widowed    Tobacco Counseling Counseling given: Not Answered Tobacco comments: pt has never smoked but husband smoked   Clinical Intake:  Pre-visit preparation completed: Yes  Pain : No/denies  pain Pain Score: 0-No pain     BMI - recorded: 34.48 (04/13/2022 last ov) Nutritional Status: BMI > 30  Obese Nutritional Risks: None Diabetes: No  How often do you need to have someone help you when you read instructions, pamphlets, or other written materials from your doctor or pharmacy?: 1 - Never What is the last grade level you completed in school?: Radiation Therapist ([place])  Diabetic? no  Interpreter Needed?: No  Information entered by :: Lisette Abu, LPN.   Activities of Daily Living    06/25/2022    4:16 PM  In your present state of health, do you have any difficulty performing the following activities:  Hearing? 0  Vision? 0  Difficulty concentrating or making decisions? 0  Walking or climbing stairs? 0  Dressing or bathing? 0  Doing errands, shopping? 0  Preparing Food and eating ? N  Using the Toilet? N  In the past six months, have you accidently leaked urine? N  Do you have problems with loss of bowel control? N  Managing your Medications? N  Managing your Finances? N  Housekeeping or managing your Housekeeping? N    Patient Care Team: Girtha Rm, NP-C as PCP - General (Family Medicine) [place] as Consulting Physician (Optometry)  Indicate any recent Medical Services you may have received from other than [facility] providers in the past year (date may be approximate).     Assessment:   This is a routine wellness examination for [name].  Hearing/Vision screen Hearing Screening - Comments:: Denies hearing difficulties   Vision Screening - Comments:: Wears rx glasses - up to date  with routine eye exams with Dr. Dondra Spry at [place] issues and exercise activities discussed: Current Exercise Habits: Home exercise routine, Type of exercise: walking, Time (Minutes): 30, Frequency (Times/Week): 5, Weekly Exercise (Minutes/Week): 150, Intensity: Moderate, Exercise limited by: Other - see comments   Goals  Addressed             This Visit's Progress    My goal for 2024 is to lose some weight.  I would like to ose 20 pounds.        Depression Screen    06/25/2022    4:09 PM 02/10/2022    9:34 AM 12/11/2021    1:42 PM  PHQ 2/9 Scores  PHQ - 2 Score 0 0 0    Fall Risk    06/25/2022    3:52 PM 02/10/2022    9:34 AM 12/11/2021    1:42 PM 03/24/2017    2:09 PM  [place] in the past year? 0 0 0 No  Number falls in past yr: 0 0 0   Injury with Fall? 0 0 0   Risk for fall due to : No Fall Risks No Fall Risks No Fall Risks   Follow up Falls prevention discussed Falls evaluation completed Falls evaluation completed     [place]:  Any stairs in or around the home? No  If so, are there any without handrails? No  Home free of loose throw rugs in walkways, pet beds, electrical cords, etc? Yes  Adequate lighting in your home to reduce risk of falls? Yes   ASSISTIVE DEVICES UTILIZED TO PREVENT FALLS:  Life alert? No  Use of a cane, walker or w/c? No  Grab bars in the bathroom? No  Shower chair or bench in shower? Yes  Elevated toilet seat or a handicapped toilet? No   TIMED UP AND GO:  Was the test performed? No . Phone Visit   Cognitive Function:      03/24/2017    2:10 PM  Montreal Cognitive Assessment   Visuospatial/ Executive (0/5) 4  Naming (0/3) 3  Attention: Read list of digits (0/2) 2  Attention: Read list of letters (0/1) 1  Attention: Serial 7 subtraction starting at 100 (0/3) 3  Language: Repeat phrase (0/2) 2  Language : Fluency (0/1) 1  Abstraction (0/2) 2  Delayed Recall (0/5) 4  Orientation (0/6) 5  Total 27  Adjusted Score (based on education) 27      06/25/2022    4:17 PM  6CIT Screen  What Year? 0 points  What month? 0 points  What time? 0 points  Count back from 20 0 points  Months in reverse 0 points  Repeat phrase 0 points  Total Score 0 points    Immunizations Immunization History  Administered  Date(s) Administered   Fluad Quad(high Dose 65+) 05/13/2020, 05/20/2021, 05/19/2022   Influenza Split 05/24/2013, 05/10/2015, 05/23/2016   Influenza Whole 06/24/2009, 05/24/2010, 05/25/2011, 05/24/2012, 05/17/2017, 04/24/2018   Influenza, High Dose Seasonal PF 06/05/2019   Influenza,inj,Quad PF,6+ Mos 06/07/2014   Influenza-Unspecified 06/09/2017, 06/05/2019   PFIZER(Purple Top)SARS-COV-2 Vaccination 01/23/2020, 02/14/2020   Pneumococcal Conjugate-13 04/27/2014   Pneumococcal Polysaccharide-23 05/24/2008, 05/17/2012, 01/24/2019   Td 05/23/2009   Tdap 01/24/2019    TDAP status: Up to date  Flu Vaccine status: Up to date  Pneumococcal vaccine status: Up to date  Covid-19 vaccine status: Completed vaccines  Qualifies for Shingles Vaccine? Yes  Zostavax completed No   Shingrix Completed?: No.    Education has been provided regarding the importance of this vaccine. Patient has been advised to call insurance company to determine out of pocket expense if they have not yet received this vaccine. Advised may also receive vaccine at local pharmacy or Health Dept. Verbalized acceptance and understanding.  Screening Tests Health Maintenance  Topic Date Due   COVID-19 Vaccine (3 - Pfizer series) 04/10/2020   Medicare Annual Wellness (AWV)  06/26/2023   MAMMOGRAM  01/28/2024   COLONOSCOPY (Pts 45-70yr Insurance coverage will need to be confirmed)  07/09/2024   Pneumonia Vaccine 74 Years old  Completed   INFLUENZA VACCINE  Completed   DEXA SCAN  Completed   Hepatitis C Screening  Completed   HPV VACCINES  Aged Out   TETANUS/TDAP  Discontinued   Zoster Vaccines- Shingrix  Discontinued    Health Maintenance  Health Maintenance Due  Topic Date Due   COVID-19 Vaccine (3 - Pfizer series) 04/10/2020    Colorectal cancer screening: Type of screening: Colonoscopy. Completed 07/09/2014. Repeat every 10 years  Mammogram status: Completed 01/27/2022. Repeat every year  Bone Density  status: Completed 12/22/2021. Results reflect: Bone density results: OSTEOPENIA. Repeat every 2 years.  Lung Cancer Screening: (Low Dose CT Chest recommended if Age 74-80years, 30 pack-year currently smoking OR have quit w/in 15years.) does not qualify.   Lung Cancer Screening Referral: no  Additional Screening:  Hepatitis C Screening: does qualify; Completed 09/14/2017  Vision Screening: Recommended annual ophthalmology exams for early detection of glaucoma and other disorders of the eye. Is the patient up to date with their annual eye exam?  Yes  Who is the provider or what is the name of the [facility] in which the patient attends annual [facility] exams[facility] not established with a provider, would they like to be referred to a provider to establish care? No .   Dental Screening: Recommended annual dental exams for proper oral hygiene  Community Resource Referral / Chronic Care Management: CRR required this visit?  No   CCM required this visit?  No      Plan:     I have personally reviewed and noted the following in the patient's chart:   Medical and social history Use of alcohol, tobacco or illicit drugs  Current medications and supplements including opioid prescriptions. Patient is not currently taking opioid prescriptions. Functional ability and status Nutritional status Physical activity Advanced directives List of other physicians Hospitalizations, surgeries, and ER visits in previous 12 months Vitals Screenings to include cognitive, depression, and falls Referrals and appointments  In addition, I have reviewed and discussed with patient certain preventive protocols, quality metrics, and best practice recommendations. A written personalized care plan for preventive services as well as general preventive health recommendations were provided to patient.     SSheral Flow LPN   [id number]  Nurse Notes: N/A

## 2022-08-05 ENCOUNTER — Ambulatory Visit (HOSPITAL_BASED_OUTPATIENT_CLINIC_OR_DEPARTMENT_OTHER): Payer: PPO | Admitting: Psychiatry

## 2022-08-05 ENCOUNTER — Encounter (HOSPITAL_COMMUNITY): Payer: Self-pay | Admitting: Psychiatry

## 2022-08-05 VITALS — BP 176/80 | HR 69 | Ht 62.0 in | Wt 176.0 lb

## 2022-08-05 DIAGNOSIS — F325 Major depressive disorder, single episode, in full remission: Secondary | ICD-10-CM

## 2022-08-05 MED ORDER — ALPRAZOLAM 0.25 MG PO TABS
ORAL_TABLET | ORAL | 1 refills | Status: DC
Start: 2022-08-05 — End: 2023-03-17

## 2022-08-05 MED ORDER — BUPROPION HCL ER (XL) 300 MG PO TB24
300.0000 mg | ORAL_TABLET | Freq: Every day | ORAL | 7 refills | Status: DC
Start: 2022-08-05 — End: 2023-05-07

## 2022-08-05 NOTE — Progress Notes (Signed)
Psychiatric Initial Adult Assessment   Patient Identification: [name] MRN:  [id number] Date of Evaluation:  08/05/2022 Referral Source: Primary care Chief Complaint: History of depression Visit Diagnosis:   History of Present Illness:  Today the patient is doing very well.  Her mood is good.  Her brother did die a few months ago but she seems to handle it well.  The patient says her stress level is low.  Not going to work is good for her.  Her biggest stress is financial issues.  Her health is good.  The patient has a very good relationship with her son and daughter-in-law.  The patient takes her teenage grandsons ,ages 39 and 34 to school every day.  The patient owns her own home.  She likes it.  Her mood is good.  She is sleeping and eating well.  She has got good energy.  She ran out of money and actually has not gone back to [place].  She only takes Wellbutrin.  This patient has never been in a psychiatric hospital and never been suicidal.  She is extremely well over the last number of years.  I shared with her that it is okay just to take the Wellbutrin for now.  She also takes a rare dose of Xanax when needed.  Associated Signs/Symptoms: Depression Symptoms:  fatigue, (Hypo) Manic Symptoms:   Anxiety Symptoms:   Psychotic Symptoms:   PTSD Symptoms: NA  Past Psychiatric History: Past psychiatric medications and therapy  Previous Psychotropic Medications: Yes   Substance Abuse History in the last 12 months:  No.  Consequences of Substance Abuse: NA  Past Medical History:  Past Medical History:  Diagnosis Date   10 year risk of MI or stroke 7.5% or greater 02/11/2022   ASCVD 10 yr risk 22.7%  Recommend starting statin    Allergic rhinitis    Allergy    Anxiety    Anxiety and depression    Arthritis    Asthma    years ago-1993-94   Barrett's esophagus 05/2005   Blood transfusion without reported diagnosis    as baby   Cancer ([place])    Basal cell face   Cataract     bilateral removal   Depression    Diverticulosis    Esophageal stricture    Gastric polyps    GERD (gastroesophageal reflux disease)    Hiatal hernia    Hypertension    on meds   Morbid obesity (HCC)    OSA (obstructive sleep apnea)    Osteoporosis    Pneumonia    many years ago   Positive PPD    Sleep apnea    bi pap    Past Surgical History:  Procedure Laterality Date   CESAREAN SECTION     x 2   CHOLECYSTECTOMY     COLONOSCOPY     ENDOSCOPIC RETROGRADE CHOLANGIOPANCREATOGRAPHY (ERCP) WITH PROPOFOL N/A 09/17/2017   Procedure: ENDOSCOPIC RETROGRADE CHOLANGIOPANCREATOGRAPHY (ERCP) WITH PROPOFOL;  Surgeon: Ladene Artist, MD;  Location: [facility];  Service: Endoscopy;  Laterality: N/A;   FINGER SURGERY     cyst removel from right index finger   FOOT FRACTURE SURGERY     right    FOOT NEUROMA SURGERY     left    TONSILLECTOMY     TUBAL LIGATION  1981   UPPER GASTROINTESTINAL ENDOSCOPY     WRIST FRACTURE SURGERY     right     Family Psychiatric History:   Family History:  Family History  Problem Relation Age of Onset   Allergies Mother    Alzheimer's disease Mother    Heart failure Mother    Arthritis Mother    Hearing loss Mother    Coronary artery disease Father    Heart failure Father    Early death Father    Heart disease Father    Hypertension Brother        x 3   Sleep apnea Brother    Heart attack Paternal Grandmother    Heart disease Paternal Grandmother    Lung cancer Paternal Grandfather    Sleep apnea Son    Diabetes Son    Colon cancer Maternal Uncle    Rectal cancer Neg Hx    Stomach cancer Neg Hx    Esophageal cancer Neg Hx    Pancreatic cancer Neg Hx    Colon polyps Neg Hx     Social History:   Social History   Socioeconomic History   Marital status: Widowed    Spouse name: Not on file   Number of children: 2   Years of education: Not on file   Highest education level: Not on file  Occupational History   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   She works as Facilities manager.   Highest level of education:  Some college   Social Determinants of Health   Financial Resource Strain: [place]  (06/25/2022)   Overall Financial Resource Strain (CARDIA)    Difficulty of Paying Living Expenses: Not hard at all  Food Insecurity: No Food Insecurity (06/25/2022)   Hunger Vital Sign    Worried About Running Out of Food in the Last Year: Never true    Ran Out of Food in the Last Year: Never true  Transportation Needs: No Transportation Needs (06/25/2022)   PRAPARE - Hydrologist (Medical): No    Lack of Transportation (Non-Medical): No  Physical Activity: Sufficiently Active (06/25/2022)   Exercise Vital Sign    Days of Exercise per Week: 5 days    Minutes of Exercise per Session: 30 min  Stress: No Stress Concern Present (06/25/2022)   [place]    Feeling of Stress : Not at all  Social Connections: Moderately Integrated (06/25/2022)   Social Connection and Isolation Panel [NHANES]    Frequency of Communication with Friends and Family: More than three times a week    Frequency of Social Gatherings with Friends and Family: More than three times a week    Attends Religious Services: More than 4 times per year    Active Member of [employer] or Organizations: Yes    Attends Archivist Meetings: More than 4 times per year    Marital Status: Widowed    Additional Social History:   Allergies:    Allergies  Allergen Reactions   Molds & Smuts Other (See Comments)   Epinephrine     REACTION: tachycardia  Other     [employer] Pollen    Metabolic Disorder Labs: No results found for: "HGBA1C", "MPG" No results found for: "PROLACTIN" Lab Results  Component Value Date   CHOL 183 02/10/2022   TRIG 167.0 (H) 02/10/2022   HDL 47.30 02/10/2022   CHOLHDL 4 02/10/2022   VLDL 33.4 02/10/2022   LDLCALC 103 (H) 02/10/2022   LDLCALC 113 (H) 10/08/2020   Lab Results  Component Value Date   TSH 0.81 02/10/2022    Therapeutic Level Labs: No results found for: "LITHIUM" No results found for: "CBMZ" No results found for: "VALPROATE"  Current Medications: Current Outpatient Medications  Medication Sig Dispense Refill   acetaminophen (TYLENOL) 325 MG tablet Take 650 mg by mouth every 6 (six) hours as needed. For pain     Calcium Carbonate-Vitamin D 600-400 MG-UNIT per tablet Take 1 tablet by mouth daily.     colestipol (COLESTID) 1 g tablet Take 1 tablet (1 g total) by mouth 2 (two) times daily. 60 tablet 0   dicyclomine (BENTYL) 10 MG capsule TAKE 1 CAPSULE BY MOUTH 3 TIMES DAILY AS NEEDED FOR SPASMS. 90 capsule 10   famotidine (PEPCID) 20 MG tablet Take 20 mg by mouth at bedtime as needed. For allergies     fluticasone (FLONASE) 50 MCG/ACT nasal spray 2 SPRAYS IN EACH NOSTRIL ONCE A DAY. 16 g 11   Multiple Vitamin (MULTIVITAMIN WITH MINERALS) TABS tablet Take 1 tablet by mouth daily.     MYRBETRIQ 50 MG TB24 tablet Take 50 mg by mouth daily.     pantoprazole (PROTONIX) 40 MG tablet TAKE 1 TABLET BY MOUTH TWICE A DAY BEFORE BREAKFAST AND DINNER 60 tablet 10   tolterodine (DETROL LA) 4 MG 24 hr capsule Take 4 mg by mouth at bedtime.     ALPRAZolam (XANAX) 0.25 MG tablet For anxiety 1  q day 15 tablet 1   buPROPion (WELLBUTRIN XL) 300 MG 24 hr tablet Take 1 tablet (300 mg total) by mouth daily. 30 tablet 7   Current Facility-Administered Medications  Medication Dose Route  Frequency Provider Last Rate Last Admin   0.9 %  sodium chloride infusion  500 mL Intravenous Once Ladene Artist, MD        Musculoskeletal: Strength & Muscle Tone: within normal limits Gait & Station: normal Patient leans: N/A  Psychiatric Specialty Exam: Review of Systems  Blood pressure (!) 176/80, pulse 69, height '5\' 2"'$  (1.575 m), weight 176 lb (79.8 kg).Body mass index is 32.19 kg/m.  General Appearance: Casual  Eye Contact:  Good  Speech:  Clear and Coherent  Volume:  Normal  Mood:  Negative  Affect:  Appropriate  Thought Process:  Goal Directed  Orientation:  Full (Time, Place, and Person)  Thought Content:  Logical  Suicidal Thoughts:  No  Homicidal Thoughts:  No  Memory:  NA  Judgement:  Good  Insight:  Good  Psychomotor Activity:  Normal  Concentration:    Recall:  NA  Fund of Knowledge:Fair  Language: Good  Akathisia:  No  Handed:  Right  AIMS (if indicated):  not done  Assets:  Desire for Improvement  ADL's:  Intact  Cognition: WNL  Sleep:  Good   Screenings: PHQ2-9    Flowsheet Row Clinical Support from 06/25/2022 in [place] at [facility] Visit from 02/10/2022 in [place] at [facility] Visit from 12/11/2021 in [place] at [facility] Total Score 0 0 0  [place] from 04/06/2022 in [facility] at [place] No Risk       Assessment and Plan:     This patient's diagnosis is major depression in remission.  She will continue taking 300 mg of Wellbutrin.  She also takes a small dose of Xanax on a as needed basis.  She is doing very well.  She is functioning well.  Her health is good.  This patient she will return to see me again in 7 months.  Collaboration of Care:   Patient/Guardian was advised Release of Information must be obtained prior to any record release in order to collaborate their care with an outside provider. Patient/Guardian was  advised if they have not already done so to contact the registration department to sign all necessary forms in order for [place] to release information regarding their care.   Consent: Patient/Guardian gives verbal consent for treatment and assignment of benefits for services provided during this visit. Patient/Guardian expressed understanding and agreed to proceed.   Jerral Ralph, MD 12/13/20232:27 PM

## 2022-10-31 ENCOUNTER — Other Ambulatory Visit: Payer: Self-pay | Admitting: Gastroenterology

## 2023-02-17 ENCOUNTER — Ambulatory Visit (INDEPENDENT_AMBULATORY_CARE_PROVIDER_SITE_OTHER): Payer: PPO | Admitting: Family Medicine

## 2023-02-17 ENCOUNTER — Encounter: Payer: Self-pay | Admitting: Family Medicine

## 2023-02-17 VITALS — BP 142/70 | HR 56 | Temp 97.6°F | Ht 62.0 in | Wt 184.0 lb

## 2023-02-17 DIAGNOSIS — M8580 Other specified disorders of bone density and structure, unspecified site: Secondary | ICD-10-CM

## 2023-02-17 DIAGNOSIS — Z9189 Other specified personal risk factors, not elsewhere classified: Secondary | ICD-10-CM | POA: Diagnosis not present

## 2023-02-17 DIAGNOSIS — Z0001 Encounter for general adult medical examination with abnormal findings: Secondary | ICD-10-CM | POA: Insufficient documentation

## 2023-02-17 DIAGNOSIS — Z0000 Encounter for general adult medical examination without abnormal findings: Secondary | ICD-10-CM | POA: Diagnosis not present

## 2023-02-17 DIAGNOSIS — E559 Vitamin D deficiency, unspecified: Secondary | ICD-10-CM | POA: Diagnosis not present

## 2023-02-17 DIAGNOSIS — Z13220 Encounter for screening for lipoid disorders: Secondary | ICD-10-CM

## 2023-02-17 DIAGNOSIS — Z8249 Family history of ischemic heart disease and other diseases of the circulatory system: Secondary | ICD-10-CM | POA: Diagnosis not present

## 2023-02-17 DIAGNOSIS — Z789 Other specified health status: Secondary | ICD-10-CM

## 2023-02-17 DIAGNOSIS — I10 Essential (primary) hypertension: Secondary | ICD-10-CM

## 2023-02-17 LAB — CBC WITH DIFFERENTIAL/PLATELET
Basophils Absolute: 0 10*3/uL (ref 0.0–0.1)
Basophils Relative: 0.7 % (ref 0.0–3.0)
Eosinophils Absolute: 0.1 10*3/uL (ref 0.0–0.7)
Eosinophils Relative: 1.8 % (ref 0.0–5.0)
HCT: 38.9 % (ref 36.0–46.0)
Hemoglobin: 12.4 g/dL (ref 12.0–15.0)
Lymphocytes Relative: 31.7 % (ref 12.0–46.0)
Lymphs Abs: 2.3 10*3/uL (ref 0.7–4.0)
MCHC: 32 g/dL (ref 30.0–36.0)
MCV: 92.6 fl (ref 78.0–100.0)
Monocytes Absolute: 0.5 10*3/uL (ref 0.1–1.0)
Monocytes Relative: 7.1 % (ref 3.0–12.0)
Neutro Abs: 4.2 10*3/uL (ref 1.4–7.7)
Neutrophils Relative %: 58.7 % (ref 43.0–77.0)
Platelets: 320 10*3/uL (ref 150.0–400.0)
RBC: 4.2 Mil/uL (ref 3.87–5.11)
RDW: 14.4 % (ref 11.5–15.5)
WBC: 7.1 10*3/uL (ref 4.0–10.5)

## 2023-02-17 LAB — COMPREHENSIVE METABOLIC PANEL
ALT: 12 U/L (ref 0–35)
AST: 15 U/L (ref 0–37)
Albumin: 4.1 g/dL (ref 3.5–5.2)
Alkaline Phosphatase: 45 U/L (ref 39–117)
BUN: 13 mg/dL (ref 6–23)
CO2: 32 mEq/L (ref 19–32)
Calcium: 9.3 mg/dL (ref 8.4–10.5)
Chloride: 102 mEq/L (ref 96–112)
Creatinine, Ser: 0.74 mg/dL (ref 0.40–1.20)
GFR: 79.33 mL/min (ref >60.00)
Glucose, Bld: 88 mg/dL (ref 70–99)
Potassium: 4.1 mEq/L (ref 3.5–5.1)
Sodium: 140 mEq/L (ref 135–145)
Total Bilirubin: 0.5 mg/dL (ref 0.2–1.2)
Total Protein: 6.8 g/dL (ref 6.0–8.3)

## 2023-02-17 LAB — LIPID PANEL
Cholesterol: 198 mg/dL (ref 0–200)
HDL: 48.6 mg/dL (ref >39.00)
LDL Cholesterol: 123 mg/dL — ABNORMAL HIGH (ref 0–99)
NonHDL: 149.04
Total CHOL/HDL Ratio: 4
Triglycerides: 131 mg/dL (ref 0.0–149.0)
VLDL: 26.2 mg/dL (ref 0.0–40.0)

## 2023-02-17 LAB — VITAMIN D 25 HYDROXY (VIT D DEFICIENCY, FRACTURES): VITD: 33.37 ng/mL (ref 30.00–100.00)

## 2023-02-17 NOTE — Assessment & Plan Note (Signed)
Currently asymptomatic. High risk for CV disease. Referral to cardiology.

## 2023-02-17 NOTE — Progress Notes (Signed)
Complete physical exam  Patient: [name]   DOB: 1946-01-04   77 y.o. Female  MRN: [id number]  Subjective:    Chief Complaint  Patient presents with   Annual Exam    fasting   She is here for a complete physical exam.   Other providers: Dr. [name]- pulmonologist  Dr. Russella Dar- GI  Dr. Ardyth Gal- Vein [facility]  [facility] [facility] Dr. Mayford Knife Dermatologist [place]  Psychiatrist - Corinda Gubler    Hx of pos TB for at least 50 years - she saw Dr. [name]   Immunizations: UTD. Declines Shingrix    Health maintenance:   Mammogram: done in May 2024 and normal per patient. Done at [facility]  DEXA: 12/2021 Colonoscopy: 2015 and 10 year recall  Last Dental Exam: regular visits. [facility]  Last Eye Exam: [facility] Maintenance  Topic Date Due   COVID-19 Vaccine (3 - 2023-24 season) 03/05/2023*   Flu Shot  03/25/2023   Medicare Annual Wellness Visit  06/26/2023   Colon Cancer Screening  07/09/2024   DTaP/Tdap/Td vaccine (3 - Td or Tdap) 01/23/2029   Pneumonia Vaccine  Completed   DEXA scan (bone density measurement)  Completed   Hepatitis C Screening  Completed   HPV Vaccine  Aged Out   Zoster (Shingles) Vaccine  Discontinued  *Topic was postponed. The date shown is not the original due date.    Wears seatbelt always, uses sunscreen, smoke detectors in home and functioning, does not text while driving, feels safe in home environment.  Depression screening:    02/17/2023    8:33 AM 06/25/2022    4:09 PM 02/10/2022    9:34 AM  Depression screen PHQ 2/9  Decreased Interest 0 0 0  Down, Depressed, Hopeless 0 0 0  PHQ - 2 Score 0 0 0  Altered sleeping 0    Tired, decreased energy 0    Change in appetite 0    Feeling bad or failure about yourself  0    Trouble concentrating 0    Moving slowly or fidgety/restless 0    Suicidal thoughts 0    PHQ-9 Score 0     Anxiety Screening:     No data to display          Vision:Within last year  and Dental: No current dental problems and Receives regular dental care  Patient Active Problem List   Diagnosis Date Noted   Family history of heart disease in female family member before age 59 02/17/2023   Statin intolerance 02/17/2023   Encounter for general adult medical examination with abnormal findings 02/17/2023   10 year risk of MI or stroke 7.5% or greater 02/11/2022   Obesity (BMI 30.0-34.9) 02/10/2022   Neoplasm of skin 10/16/2021   Impingement syndrome of left shoulder region 01/30/2020   Impingement syndrome of right shoulder region 01/30/2020   Osteoporosis 01/01/2020   Abnormal bruising 12/13/2019   Viral illness 07/27/2019   Nonspecific syndrome suggestive of viral illness 07/17/2019   Cellulitis 06/05/2019   Referred otalgia of left ear 01/03/2019   Chronic tension headache 08/03/2018   Pain in finger of left hand 07/01/2018   Dysphonia 06/07/2018   Chronic hoarseness 05/20/2018   Biliary pain    Common bile duct stone    Elevated LFTs 09/14/2017   Epigastric pain 09/13/2017   Laceration of nail bed of finger 08/28/2017   Localized, primary osteoarthritis of shoulder region 08/28/2017   Trigger finger of  left hand 08/28/2017   Memory changes 03/25/2017   Venous stasis dermatitis of both lower extremities 08/20/2016   Chest pain 07/20/2016   Leg swelling 03/29/2016   Abdominal pain 03/02/2016   Shingles 04/24/2015   Special screening for malignant neoplasms, colon 12/20/2013   Health care maintenance 05/22/2013   Hypokalemia 04/12/2013   Depression 04/12/2013   Other malaise and fatigue 03/16/2013   Hip pain 07/29/2012   Neck pain 05/17/2012   Essential hypertension 01/15/2012   Osteopenia 05/25/2011   Tick bite of neck 04/07/2011   Vitamin D deficiency 06/03/2010   HYPERCHOLESTEROLEMIA 08/03/2008   Diverticulosis of colon 02/01/2008   RHINITIS, CHRONIC 01/31/2008   Morbid obesity due to excess calories (HCC) complicated by hbp/ djd  11/03/2007    Obstructive sleep apnea 11/03/2007   GERD 11/03/2007   POSITIVE PPD 11/03/2007   Past Medical History:  Diagnosis Date   10 year risk of MI or stroke 7.5% or greater 02/11/2022   ASCVD 10 yr risk 22.7%  Recommend starting statin    Allergic rhinitis    Allergy    Anxiety    Anxiety and depression    Arthritis    Asthma    years ago-1993-94   Barrett's esophagus 05/2005   Blood transfusion without reported diagnosis    as baby   Cancer (HCC)    Basal cell face   Cataract    bilateral removal   Depression    Diverticulosis    Esophageal stricture    Gastric polyps    GERD (gastroesophageal reflux disease)    Hiatal hernia    Hypertension    on meds   Morbid obesity (HCC)    OSA (obstructive sleep apnea)    Osteoporosis    Pneumonia    many years ago   Positive PPD    Sleep apnea    bi pap   Ulcer    Past Surgical History:  Procedure Laterality Date   CESAREAN SECTION     x 2   CHOLECYSTECTOMY     COLONOSCOPY     ENDOSCOPIC RETROGRADE CHOLANGIOPANCREATOGRAPHY (ERCP) WITH PROPOFOL N/A 09/17/2017   Procedure: ENDOSCOPIC RETROGRADE CHOLANGIOPANCREATOGRAPHY (ERCP) WITH PROPOFOL;  Surgeon: Meryl Dare, MD;  Location[facility] ENDOSCOPY;  Service: Endoscopy;  Laterality: N/A;   EYE SURGERY     Cataracts   FINGER SURGERY     cyst removel from right index finger   FOOT FRACTURE SURGERY     right    FOOT NEUROMA SURGERY     left    FRACTURE SURGERY     Rt foot. Rt arm   HERNIA REPAIR     Umbilical   TONSILLECTOMY     TUBAL LIGATION  08/25/1979   UPPER GASTROINTESTINAL ENDOSCOPY     WRIST FRACTURE SURGERY     right    Social History   Tobacco Use   Smoking status: Never   Smokeless tobacco: Never   Tobacco comments:    pt has never smoked but husband smoked  Vaping Use   Vaping Use: Never used  Substance Use Topics   Alcohol use: Not Currently   Drug use: Never      Patient Care Team: Avanell Shackleton, NP-C as PCP - General (Family Medicine) [facility] [place], [place] as Consulting Physician (Optometry)   Outpatient Medications Prior to Visit  Medication Sig   acetaminophen (TYLENOL) 325 MG tablet Take 650 mg by mouth every 6 (six) hours as needed. For pain  ALPRAZolam (XANAX) 0.25 MG tablet For anxiety 1  q day   buPROPion (WELLBUTRIN XL) 300 MG 24 hr tablet Take 1 tablet (300 mg total) by mouth daily.   Calcium Carbonate-Vitamin D 600-400 MG-UNIT per tablet Take 1 tablet by mouth daily.   colestipol (COLESTID) 1 g tablet Take 1 tablet (1 g total) by mouth 2 (two) times daily.   dicyclomine (BENTYL) 20 MG tablet TAKE 1/2 TABLET BY MOUTH 3 TIMES DAILY AS NEEDED FOR SPASMS.   famotidine (PEPCID) 20 MG tablet Take 20 mg by mouth at bedtime as needed. For allergies   fluticasone (FLONASE) 50 MCG/ACT nasal spray 2 SPRAYS IN EACH NOSTRIL ONCE A DAY.   Multiple Vitamin (MULTIVITAMIN WITH MINERALS) TABS tablet Take 1 tablet by mouth daily.   MYRBETRIQ 50 MG TB24 tablet Take 50 mg by mouth daily.   pantoprazole (PROTONIX) 40 MG tablet TAKE 1 TABLET BY MOUTH TWICE A DAY BEFORE BREAKFAST AND DINNER   tolterodine (DETROL LA) 4 MG 24 hr capsule Take 4 mg by mouth at bedtime.   Facility-Administered Medications Prior to Visit  Medication Dose Route Frequency Provider   0.9 %  sodium chloride infusion  500 mL Intravenous Once Meryl Dare, MD    Review of Systems  Constitutional:  Negative for chills and fever.  HENT:  Negative for congestion, ear pain, sinus pain and sore throat.   Eyes:  Negative for blurred vision, double vision and pain.  Respiratory:  Negative for cough, shortness of breath and wheezing.   Cardiovascular:  Negative for chest pain, palpitations and leg swelling.  Gastrointestinal:  Negative for abdominal pain, constipation, diarrhea, nausea and vomiting.  Genitourinary:  Negative for dysuria, frequency and urgency.  Musculoskeletal:  Negative for back pain, joint pain and myalgias.  Skin:  Negative for rash.   Neurological:  Negative for dizziness, tingling, focal weakness and headaches.  Endo/Heme/Allergies:  Does not bruise/bleed easily.  Psychiatric/Behavioral:  Negative for depression, memory loss and suicidal ideas. The patient is not nervous/anxious.        Objective:    BP (!) 142/70 (BP Location: Left Arm, Patient Position: Sitting, Cuff Size: Large)   Pulse (!) 56   Temp 97.6 F (36.4 C) (Temporal)   Ht 5\' 2"  (1.575 m)   Wt 184 lb (83.5 kg)   SpO2 97%   BMI 33.65 kg/m  BP Readings from Last 3 Encounters:  02/17/23 (!) 142/70  04/13/22 130/60  04/06/22 (!) 169/76   Wt Readings from Last 3 Encounters:  02/17/23 184 lb (83.5 kg)  04/13/22 182 lb 6 oz (82.7 kg)  04/06/22 180 lb (81.6 kg)    Physical Exam Constitutional:      General: She is not in acute distress.    Appearance: She is not ill-appearing.  HENT:     Right Ear: Tympanic membrane, ear canal and external ear normal.     Left Ear: Tympanic membrane, ear canal and external ear normal.     Nose: Nose normal.     Mouth/Throat:     Mouth: Mucous membranes are moist.     Pharynx: Oropharynx is clear.  Eyes:     Extraocular Movements: Extraocular movements intact.     Conjunctiva/sclera: Conjunctivae normal.     Pupils: Pupils are equal, round, and reactive to light.  Neck:     Thyroid: No thyroid mass, thyromegaly or thyroid tenderness.  Cardiovascular:     Rate and Rhythm: Normal rate and regular rhythm.  Pulses: Normal pulses.     Heart sounds: Normal heart sounds.  Pulmonary:     Effort: Pulmonary effort is normal.     Breath sounds: Normal breath sounds.  Abdominal:     General: Bowel sounds are normal.     Palpations: Abdomen is soft.     Tenderness: There is no abdominal tenderness. There is no right CVA tenderness, left CVA tenderness, guarding or rebound.  Musculoskeletal:        General: Normal range of motion.     Cervical back: Normal range of motion and neck supple. No tenderness.      Right lower leg: No edema.     Left lower leg: No edema.  Lymphadenopathy:     Cervical: No cervical adenopathy.  Skin:    General: Skin is warm and dry.     Findings: No lesion or rash.  Neurological:     General: No focal deficit present.     Mental Status: She is alert and oriented to person, place, and time.     Cranial Nerves: No cranial nerve deficit.     Sensory: No sensory deficit.     Motor: No weakness.     Coordination: Coordination normal.     Gait: Gait normal.  Psychiatric:        Mood and Affect: Mood normal.        Behavior: Behavior normal.        Thought Content: Thought content normal.      Results for orders placed or performed in visit on 02/17/23  VITAMIN D 25 Hydroxy (Vit-D Deficiency, Fractures)  Result Value Ref Range   VITD 33.37 30.00 - 100.00 ng/mL  Lipid panel  Result Value Ref Range   Cholesterol 198 0 - 200 mg/dL   Triglycerides 161.0 0.0 - 149.0 mg/dL   HDL 96.04 >54.09 mg/dL   VLDL 81.1 0.0 - 91.4 mg/dL   LDL Cholesterol 782 (H) 0 - 99 mg/dL   Total CHOL/HDL Ratio 4    NonHDL 149.04   Comprehensive metabolic panel  Result Value Ref Range   Sodium 140 135 - 145 mEq/L   Potassium 4.1 3.5 - 5.1 mEq/L   Chloride 102 96 - 112 mEq/L   CO2 32 19 - 32 mEq/L   Glucose, Bld 88 70 - 99 mg/dL   BUN 13 6 - 23 mg/dL   Creatinine, Ser 9.56 0.40 - 1.20 mg/dL   Total Bilirubin 0.5 0.2 - 1.2 mg/dL   Alkaline Phosphatase 45 39 - 117 U/L   AST 15 0 - 37 U/L   ALT 12 0 - 35 U/L   Total Protein 6.8 6.0 - 8.3 g/dL   Albumin 4.1 3.5 - 5.2 g/dL   GFR 21.30 >86.57 mL/min   Calcium 9.3 8.4 - 10.5 mg/dL  CBC with Differential/Platelet  Result Value Ref Range   WBC 7.1 4.0 - 10.5 K/uL   RBC 4.20 3.87 - 5.11 Mil/uL   Hemoglobin 12.4 12.0 - 15.0 g/dL   HCT 84.6 96.2 - 95.2 %   MCV 92.6 78.0 - 100.0 fl   MCHC 32.0 30.0 - 36.0 g/dL   RDW 84.1 32.4 - 40.1 %   Platelets 320.0 150.0 - 400.0 K/uL   Neutrophils Relative % 58.7 43.0 - 77.0 %   Lymphocytes  Relative 31.7 12.0 - 46.0 %   Monocytes Relative 7.1 3.0 - 12.0 %   Eosinophils Relative 1.8 0.0 - 5.0 %   Basophils Relative 0.7 0.0 - 3.0 %  Neutro Abs 4.2 1.4 - 7.7 K/uL   Lymphs Abs 2.3 0.7 - 4.0 K/uL   Monocytes Absolute 0.5 0.1 - 1.0 K/uL   Eosinophils Absolute 0.1 0.0 - 0.7 K/uL   Basophils Absolute 0.0 0.0 - 0.1 K/uL      Assessment & Plan:    Routine Health Maintenance and Physical Exam  Problem List Items Addressed This Visit       Cardiovascular and Mediastinum   Essential hypertension    Monitor BP at home. Low sodium diet. Check renal function.       Relevant Orders   CBC with Differential/Platelet (Completed)   Comprehensive metabolic panel (Completed)     Musculoskeletal and Integument   Osteopenia    Continue getting adequate calcium and vitamin D. Do weight bearing exercises. Check vitamin D. DEXA due in 2025.         Other   10 year risk of MI or stroke 7.5% or greater    Did not continue statin due to intolerance/preference. Referral to cardiologist.       Relevant Orders   Ambulatory referral to Cardiology   CBC with Differential/Platelet (Completed)   Comprehensive metabolic panel (Completed)   Lipid panel (Completed)   Encounter for general adult medical examination with abnormal findings - Primary    Preventive health care reviewed.  Counseling on healthy lifestyle including diet and exercise.  Recommend regular dental and eye exams.  Immunizations reviewed.  Discussed safety.       Family history of heart disease in female family member before age 59    Currently asymptomatic. High risk for CV disease. Referral to cardiology.       Relevant Orders   Ambulatory referral to Cardiology   Statin intolerance   Relevant Orders   Ambulatory referral to Cardiology   Vitamin D deficiency    Check vitamin D level as follow up      Relevant Orders   VITAMIN D 25 Hydroxy (Vit-D Deficiency, Fractures) (Completed)    No follow-ups on file.      Hetty Blend, NP-C

## 2023-02-17 NOTE — Assessment & Plan Note (Signed)
Did not continue statin due to intolerance/preference. Referral to cardiologist.

## 2023-02-17 NOTE — Assessment & Plan Note (Signed)
Check vitamin D level as follow up

## 2023-02-17 NOTE — Assessment & Plan Note (Signed)
Monitor BP at home. Low sodium diet. Check renal function.

## 2023-02-17 NOTE — Assessment & Plan Note (Signed)
Continue getting adequate calcium and vitamin D. Do weight bearing exercises. Check vitamin D. DEXA due in 2025.

## 2023-02-17 NOTE — Assessment & Plan Note (Signed)
Preventive health care reviewed.  Counseling on healthy lifestyle including diet and exercise.  Recommend regular dental and eye exams.  Immunizations reviewed.  Discussed safety.  

## 2023-02-17 NOTE — Patient Instructions (Signed)
Please go downstairs for labs.   You will hear from [facility] to schedule a visit.   Monitor your blood pressure at home please. 130/80 or lower is the goal.   We will be in touch with your results.

## 2023-03-09 ENCOUNTER — Other Ambulatory Visit (HOSPITAL_COMMUNITY): Payer: Self-pay | Admitting: Psychiatry

## 2023-03-09 ENCOUNTER — Ambulatory Visit (HOSPITAL_COMMUNITY): Payer: PPO | Admitting: Psychiatry

## 2023-03-09 ENCOUNTER — Other Ambulatory Visit: Payer: Self-pay | Admitting: Family Medicine

## 2023-03-09 DIAGNOSIS — J310 Chronic rhinitis: Secondary | ICD-10-CM

## 2023-03-10 ENCOUNTER — Encounter: Payer: Self-pay | Admitting: Gastroenterology

## 2023-03-10 ENCOUNTER — Ambulatory Visit: Payer: PPO | Admitting: Gastroenterology

## 2023-03-10 VITALS — BP 126/60 | HR 62 | Ht 61.0 in | Wt 181.0 lb

## 2023-03-10 DIAGNOSIS — K219 Gastro-esophageal reflux disease without esophagitis: Secondary | ICD-10-CM | POA: Diagnosis not present

## 2023-03-10 DIAGNOSIS — K580 Irritable bowel syndrome with diarrhea: Secondary | ICD-10-CM

## 2023-03-10 MED ORDER — FAMOTIDINE 20 MG PO TABS: 20.0000 mg | ORAL_TABLET | Freq: Every evening | ORAL | 11 refills | Status: DC | PRN

## 2023-03-10 MED ORDER — DICYCLOMINE HCL 20 MG PO TABS: ORAL_TABLET | ORAL | 11 refills | Status: DC

## 2023-03-10 MED ORDER — PANTOPRAZOLE SODIUM 40 MG PO TBEC: DELAYED_RELEASE_TABLET | ORAL | 11 refills | Status: DC

## 2023-03-10 NOTE — Progress Notes (Signed)
    Assessment     IBS-D Coccyx area pain c/w with a musculoskeletal etiology GERD CRC screening, average risk   Recommendations    Continue dicyclomine 10 mg tid ac prn, Imodium 1-2 tid prn and avoid dietary stressors Continue pantoprazole 40 mg bid, famotidine 20 mg hs and follow antireflux measures Follow-up with orthopedics for further evaluation of coccyx area pain Given her coccyx area pain which does not appear to be gastrointestinal she will consider proceeding with colonoscopy for CRC screening in the near future versus waiting the full 10 years, November 2025 REV in 1 year   HPI    This is a 77 year old female with IBS, coccyx pain and GERD.  She relates her reflux symptoms are under excellent control.  Her IBS-D is controlled with dicyclomine and Imodium.  Her IBS symptoms are stable.  For the past months she has had sacral and coccyx area pain that worsens with movement and sitting.  It does not relate to bowel movements or meals.  EGD 06/2021 - Normal esophagus.  - Multiple gastric polyps. Biopsied.  - Erythematous mucosa in the gastric body and antrum. Biopsied.  - Small hiatal hernia.  - Non-bleeding duodenal diverticulum.  Colonoscopy 06/2014 1. Mild diverticulosis in the descending colon, sigmoid colon, ascending colon, and transverse colon  2. The examination was otherwise normal    Labs / Imaging       Latest Ref Rng & Units 02/17/2023    9:31 AM 04/13/2022    8:53 AM 04/06/2022    5:13 PM  Hepatic Function  Total Protein 6.0 - 8.3 g/dL 6.8  6.7  6.9   Albumin 3.5 - 5.2 g/dL 4.1  4.1  4.0   AST 0 - 37 U/L 15  15  20    ALT 0 - 35 U/L 12  20  18    Alk Phosphatase 39 - 117 U/L 45  50  54   Total Bilirubin 0.2 - 1.2 mg/dL 0.5  0.3  0.3        Latest Ref Rng & Units 02/17/2023    9:31 AM 04/13/2022    8:53 AM 04/06/2022    5:13 PM  CBC  WBC 4.0 - 10.5 K/uL 7.1  7.1  13.9   Hemoglobin 12.0 - 15.0 g/dL 16.1  09.6  04.5   Hematocrit 36.0 - 46.0 % 38.9   38.1  42.2   Platelets 150.0 - 400.0 K/uL 320.0  317.0  333     Current Medications, Allergies, Past Medical History, Past Surgical History, Family History and Social History were reviewed in [employer] record.   Physical Exam: General: Well developed, well nourished, no acute distress Head: Normocephalic and atraumatic Eyes: Sclerae anicteric, EOMI Ears: Normal auditory acuity Mouth: No deformities or lesions noted Lungs: Clear throughout to auscultation Heart: Regular rate and rhythm; No murmurs, rubs or bruits Abdomen: Soft, non tender and non distended. No masses, hepatosplenomegaly or hernias noted. Normal Bowel sounds Back: Mild tenderness over low sacrum, coccyx Rectal: No lesions, no tenderness, heme neg brown stool Musculoskeletal: Symmetrical with no gross deformities  Pulses:  Normal pulses noted Extremities: No edema or deformities noted Neurological: Alert oriented x 4, grossly nonfocal Psychological:  Alert and cooperative. Normal mood and affect   [name] Eves T. Russella Dar, MD 03/10/2023, 2:16 PM

## 2023-03-10 NOTE — Patient Instructions (Signed)
We have sent the following medications to your pharmacy for you to pick up at your convenience: pantoprazole, bentyl and pepcid.   _______________________________________________________  If your blood pressure at your visit was 140/90 or greater, please contact your primary care physician to follow up on this.  _______________________________________________________  If you are age 75 or older, your body mass index should be between 23-30. Your Body mass index is 34.2 kg/m. If this is out of the aforementioned range listed, please consider follow up with your Primary Care Provider.  If you are age 81 or younger, your body mass index should be between 19-25. Your Body mass index is 34.2 kg/m. If this is out of the aformentioned range listed, please consider follow up with your Primary Care Provider.   ________________________________________________________  The [facility] providers would like to encourage you to use [facility] to communicate with providers for non-urgent requests or questions.  Due to long hold times on the telephone, sending your provider a message by [facility] may be a faster and more efficient way to get a response.  Please allow 48 business hours for a response.  Please remember that this is for non-urgent requests.  _______________________________________________________  Thank you for choosing me and [facility].  Venita Lick. Pleas Koch., MD., Clementeen Graham

## 2023-03-15 ENCOUNTER — Telehealth: Payer: Self-pay | Admitting: Family Medicine

## 2023-03-15 DIAGNOSIS — F32A Depression, unspecified: Secondary | ICD-10-CM

## 2023-03-15 NOTE — Telephone Encounter (Signed)
Referral placed.

## 2023-03-15 NOTE — Telephone Encounter (Signed)
Pt wanting referral sent to another location for a physiatrist to [facility]

## 2023-03-17 ENCOUNTER — Ambulatory Visit (HOSPITAL_BASED_OUTPATIENT_CLINIC_OR_DEPARTMENT_OTHER): Payer: PPO | Admitting: Psychiatry

## 2023-03-17 ENCOUNTER — Ambulatory Visit: Payer: PPO | Admitting: Family Medicine

## 2023-03-17 ENCOUNTER — Other Ambulatory Visit: Payer: Self-pay

## 2023-03-17 ENCOUNTER — Encounter (HOSPITAL_COMMUNITY): Payer: Self-pay | Admitting: Psychiatry

## 2023-03-17 VITALS — BP 157/76 | HR 64 | Ht 62.0 in | Wt 184.0 lb

## 2023-03-17 DIAGNOSIS — F325 Major depressive disorder, single episode, in full remission: Secondary | ICD-10-CM | POA: Diagnosis not present

## 2023-03-17 MED ORDER — VILAZODONE HCL 40 MG PO TABS: 40.0000 mg | ORAL_TABLET | Freq: Every day | ORAL | 7 refills | Status: AC

## 2023-03-17 MED ORDER — ALPRAZOLAM 0.25 MG PO TABS: ORAL_TABLET | ORAL | 3 refills | Status: AC

## 2023-03-17 NOTE — Progress Notes (Signed)
Psychiatric Initial Adult Assessment   Patient Identification: [name] MRN:  [id number] Date of Evaluation:  03/17/2023 Referral Source: Primary care Chief Complaint: History of depression Visit Diagnosis:   History of Present Illness:   Today the patient is doing very well.  Unfortunately she still has the listener.  Her niece apparently has metastatic breast cancer.  She is trying to deal with it.  The patient's health is actually good.  She lives alone.  She owns her own home.  She is sleeping and eating well.  She has good energy.  But since the illness of her family members she has gotten back on Viibryd 40 mg.  She also takes Wellbutrin 300 mg and she has as needed Xanax available for her.  For the most part she is sleeping and eating well and has good energy.  She is functioning extremely well.  Her diagnosis is major depression in remission.  Associated Signs/Symptoms: Depression Symptoms:  fatigue, (Hypo) Manic Symptoms:   Anxiety Symptoms:   Psychotic Symptoms:   PTSD Symptoms: NA  Past Psychiatric History: Past psychiatric medications and therapy  Previous Psychotropic Medications: Yes   Substance Abuse History in the last 12 months:  No.  Consequences of Substance Abuse: NA  Past Medical History:  Past Medical History:  Diagnosis Date   10 year risk of MI or stroke 7.5% or greater 02/11/2022   ASCVD 10 yr risk 22.7%  Recommend starting statin    Allergic rhinitis    Allergy    Anxiety    Anxiety and depression    Arthritis    Asthma    years ago-1993-94   Barrett's esophagus 05/2005   Blood transfusion without reported diagnosis    as baby   Cancer (HCC)    Basal cell face   Cataract    bilateral removal   Depression    Diverticulosis    Esophageal stricture    Gastric polyps    GERD (gastroesophageal reflux disease)    Hiatal hernia    Hypertension    on meds   Morbid obesity (HCC)    OSA (obstructive sleep apnea)    Osteoporosis     Pneumonia    many years ago   Positive PPD    Sleep apnea    bi pap   Ulcer     Past Surgical History:  Procedure Laterality Date   CESAREAN SECTION     x 2   CHOLECYSTECTOMY     COLONOSCOPY     ENDOSCOPIC RETROGRADE CHOLANGIOPANCREATOGRAPHY (ERCP) WITH PROPOFOL N/A 09/17/2017   Procedure: ENDOSCOPIC RETROGRADE CHOLANGIOPANCREATOGRAPHY (ERCP) WITH PROPOFOL;  Surgeon: Meryl Dare, MD;  Location: [facility];  Service: Endoscopy;  Laterality: N/A;   EYE SURGERY     Cataracts   FINGER SURGERY     cyst removel from right index finger   FOOT FRACTURE SURGERY     right    FOOT NEUROMA SURGERY     left    FRACTURE SURGERY     Rt foot. Rt arm   HERNIA REPAIR     Umbilical   TONSILLECTOMY     TUBAL LIGATION  08/25/1979   UPPER GASTROINTESTINAL ENDOSCOPY     WRIST FRACTURE SURGERY     right     Family Psychiatric History:   Family History:  Family History  Problem Relation Age of Onset   Allergies Mother    Alzheimer's disease Mother    Heart failure Mother    Arthritis Mother  Hearing loss Mother    Depression Mother    Coronary artery disease Father    Heart failure Father    Early death Father    Heart disease Father    Obesity Father    Hypertension Brother        x 3   Diabetes Brother    Heart disease Brother    Obesity Brother    Sleep apnea Brother    Heart disease Brother    Stroke Brother    Heart attack Paternal Grandmother    Heart disease Paternal Grandmother    Lung cancer Paternal Grandfather    Cancer Paternal Grandfather    Sleep apnea Son    Diabetes Son    Early death Son    Colon cancer Maternal Uncle    Hypertension Brother    Rectal cancer Neg Hx    Stomach cancer Neg Hx    Esophageal cancer Neg Hx    Pancreatic cancer Neg Hx    Colon polyps Neg Hx     Social History:   Social History   Socioeconomic History   Marital status: Widowed    Spouse name: Not on file   Number of children: 2   Years of education: Not on  file   Highest education level: Not on file  Occupational History   Occupation: Financial controller: OTHER    Comment: [employer] in [place]  Tobacco Use   Smoking status: Never   Smokeless tobacco: Never   Tobacco comments:    pt has never smoked but husband smoked  Vaping Use   Vaping status: Never Used  Substance and Sexual Activity   Alcohol use: Not Currently   Drug use: Never   Sexual activity: Not Currently    Birth control/protection: Post-menopausal, Surgical, Other-see comments  Other Topics Concern   Not on file  Social History Narrative   She lives alone (widowed).   She works as Systems developer.   Highest level of education:  Some college   Social Determinants of Health   Financial Resource Strain: Low Risk  (06/25/2022)   Overall Financial Resource Strain (CARDIA)    Difficulty of Paying Living Expenses: Not hard at all  Food Insecurity: No Food Insecurity (06/25/2022)   Hunger Vital Sign    Worried About Running Out of Food in the Last Year: Never true    Ran Out of Food in the Last Year: Never true  Transportation Needs: No Transportation Needs (06/25/2022)   PRAPARE - Administrator, Civil Service (Medical): No    Lack of Transportation (Non-Medical): No  Physical Activity: Sufficiently Active (06/25/2022)   Exercise Vital Sign    Days of Exercise per Week: 5 days    Minutes of Exercise per Session: 30 min  Stress: No Stress Concern Present (06/25/2022)   [employer] of Occupational Health - Occupational Stress Questionnaire    Feeling of Stress : Not at all  Social Connections: Moderately Integrated (06/25/2022)   Social Connection and Isolation Panel [NHANES]    Frequency of Communication with Friends and Family: More than three times a week    Frequency of Social Gatherings with Friends and Family: More than three times a week    Attends Religious Services: More than 4 times per year    Active Member of [employer] or  Organizations: Yes    Attends Banker Meetings: More than 4 times per year    Marital Status: Widowed  Additional Social History:   Allergies:   Allergies  Allergen Reactions   Molds & Smuts Other (See Comments)   Epinephrine     REACTION: tachycardia   Other     Tree And Shrub Pollen    Metabolic Disorder Labs: No results found for: "HGBA1C", "MPG" No results found for: "PROLACTIN" Lab Results  Component Value Date   CHOL 198 02/17/2023   TRIG 131.0 02/17/2023   HDL 48.60 02/17/2023   CHOLHDL 4 02/17/2023   VLDL 26.2 02/17/2023   LDLCALC 123 (H) 02/17/2023   LDLCALC 103 (H) 02/10/2022   Lab Results  Component Value Date   TSH 0.81 02/10/2022    Therapeutic Level Labs: No results found for: "LITHIUM" No results found for: "CBMZ" No results found for: "VALPROATE"  Current Medications: Current Outpatient Medications  Medication Sig Dispense Refill   acetaminophen (TYLENOL) 325 MG tablet Take 650 mg by mouth every 6 (six) hours as needed. For pain     buPROPion (WELLBUTRIN XL) 300 MG 24 hr tablet Take 1 tablet (300 mg total) by mouth daily. 30 tablet 7   Calcium Carbonate-Vitamin D 600-400 MG-UNIT per tablet Take 1 tablet by mouth daily.     colestipol (COLESTID) 1 g tablet Take 1 tablet (1 g total) by mouth 2 (two) times daily. 60 tablet 0   dicyclomine (BENTYL) 20 MG tablet TAKE 1/2 TABLET BY MOUTH 3 TIMES DAILY AS NEEDED FOR SPASMS. 45 tablet 11   famotidine (PEPCID) 20 MG tablet Take 1 tablet (20 mg total) by mouth at bedtime as needed. For allergies 30 tablet 11   fluticasone (FLONASE) 50 MCG/ACT nasal spray INSTILL 2 SPRAYS IN EACH NOSTRIL ONCE A DAY. 16 g 10   Multiple Vitamin (MULTIVITAMIN WITH MINERALS) TABS tablet Take 1 tablet by mouth daily.     MYRBETRIQ 50 MG TB24 tablet Take 50 mg by mouth daily.     pantoprazole (PROTONIX) 40 MG tablet TAKE 1 TABLET BY MOUTH TWICE A DAY BEFORE BREAKFAST AND DINNER 60 tablet 11   tolterodine (DETROL LA)  4 MG 24 hr capsule Take 4 mg by mouth at bedtime.     Vilazodone HCl (VIIBRYD) 40 MG TABS Take 1 tablet (40 mg total) by mouth daily. 30 tablet 7   ALPRAZolam (XANAX) 0.25 MG tablet For anxiety 1  q day 20 tablet 3   Current Facility-Administered Medications  Medication Dose Route Frequency Provider Last Rate Last Admin   0.9 %  sodium chloride infusion  500 mL Intravenous Once Meryl Dare, MD        Musculoskeletal: Strength & Muscle Tone: within normal limits Gait & Station: normal Patient leans: N/A  Psychiatric Specialty Exam: Review of Systems  Blood pressure (!) 157/76, pulse 64, height 5\' 2"  (1.575 m), weight 184 lb (83.5 kg).Body mass index is 33.65 kg/m.  General Appearance: Casual  Eye Contact:  Good  Speech:  Clear and Coherent  Volume:  Normal  Mood:  Negative  Affect:  Appropriate  Thought Process:  Goal Directed  Orientation:  Full (Time, Place, and Person)  Thought Content:  Logical  Suicidal Thoughts:  No  Homicidal Thoughts:  No  Memory:  NA  Judgement:  Good  Insight:  Good  Psychomotor Activity:  Normal  Concentration:    Recall:  NA  Fund of Knowledge:Fair  Language: Good  Akathisia:  No  Handed:  Right  AIMS (if indicated):  not done  Assets:  Desire for Improvement  ADL's:  Intact  Cognition: WNL  Sleep:  Good   Screenings: PHQ2-9    Flowsheet Row Office Visit from 02/17/2023 in [facility] Clinical Support from 06/25/2022 in [facility] at [facility] Visit from 02/10/2022 in [facility] at [facility] Visit from 12/11/2021 in [facility] at [facility]  PHQ-2 Total Score 0 0 0 0  PHQ-9 Total Score 0 -- -- --      Flowsheet Row ED from 04/06/2022 in [facility] at [facility] RISK CATEGORY No Risk       Assessment and Plan:      Today the patient is doing very well.  Her diagnosis is major depression.   She is dealing with some stress related to family illness.  Patient herself is well.  Financially she is doing okay.  She continues taking Wellbutrin 300 mg and today we clarified that she is also on 40 mg of Viibryd.  She takes Xanax on a rare as needed basis.  This patient will be seen again for 7 to 8 months from now.  Collaboration of Care:   Patient/Guardian was advised Release of Information must be obtained prior to any record release in order to collaborate their care with an outside provider. Patient/Guardian was advised if they have not already done so to contact the registration department to sign all necessary forms in order for [place] to release information regarding their care.   Consent: Patient/Guardian gives verbal consent for treatment and assignment of benefits for services provided during this visit. Patient/Guardian expressed understanding and agreed to proceed.   Gypsy Balsam, MD 7/24/20242:34 PM

## 2023-03-17 NOTE — Progress Notes (Deleted)
   Rubin Payor, PhD, LAT, ATC acting as a scribe for Clementeen Graham, MD.  [name] is a 77 y.o. female who presents to [facility] at [facility] today for low back/buttock pain x ***. MOI:?***. Pt locates pain to ***  Radiates: Aggravates: Treatments tried:  Pertinent review of systems: ***  Relevant historical information: ***   Exam:  There were no vitals taken for this visit. General: Well Developed, well nourished, and in no acute distress.   MSK: ***    Lab and Radiology Results No results found for this or any previous visit (from the past 72 hour(s)). No results found.     Assessment and Plan: 77 y.o. female with ***   PDMP not reviewed this encounter. No orders of the defined types were placed in this encounter.  No orders of the defined types were placed in this encounter.    Discussed warning signs or symptoms. Please see discharge instructions. Patient expresses understanding.   ***

## 2023-03-18 ENCOUNTER — Ambulatory Visit (INDEPENDENT_AMBULATORY_CARE_PROVIDER_SITE_OTHER): Payer: PPO

## 2023-03-18 ENCOUNTER — Ambulatory Visit: Payer: PPO | Admitting: Family Medicine

## 2023-03-18 VITALS — BP 172/74 | HR 55 | Ht 62.0 in | Wt 182.0 lb

## 2023-03-18 DIAGNOSIS — M533 Sacrococcygeal disorders, not elsewhere classified: Secondary | ICD-10-CM

## 2023-03-18 NOTE — Progress Notes (Signed)
   Rubin Payor, PhD, LAT, ATC acting as a scribe for Clementeen Graham, MD.  [name] is a 77 y.o. female who presents to [facility] at [facility] today for low back/buttock pain ongoing for about a month. She notes a hx of coccyx fx falling from a horse 50 years ago. No recent injury. Pt locates pain to midline sacral region. Pain occurs off and on but has been especially problematic for the last month.  She spends a lot of time in [facility] and notes the drive down there is pretty painful.  Radiates: no Aggravates: unsure Treatments tried: Tylenol, tramadol, IBU  Pertinent review of systems: No fevers or chills  Relevant historical information: Hypertension Osteopenia.  T-score -2.22 Dec 2021  Exam:  BP (!) 172/74   Pulse (!) 55   Ht 5\' 2"  (1.575 m)   Wt 182 lb (82.6 kg)   SpO2 93%   BMI 33.29 kg/m  General: Well Developed, well nourished, and in no acute distress.   MSK: Sacrum/buttocks: Normal appearing.  No erythema.  No induration. Mildly tender palpation.    Lab and Radiology Results  X-ray images sacrum/coccyx obtained today personally and independently interpreted Severe left hip DJD.  Moderate to severe right hip DJD.  Degenerative changes present bilateral SI joints.  No fracture visible at sacrum. Await formal radiology review    Assessment and Plan: 77 y.o. female with sacrum/coccyx pain. Location of pain is primarily in the sacrum.  This could be pressure from seated position.  Could be referred pain from facet DJD.  This was seen on a CT scan abdomen and pelvis 2 years ago.  Plan for conservative management with offloading cushion and Voltaren gel.  If not sufficient we can refer for pelvic PT at [facility] specialty clinic.  She will let me know how she feels in a few weeks.   PDMP not reviewed this encounter. Orders Placed This Encounter  Procedures   DG Sacrum/Coccyx    Standing Status:   Future    Number of  Occurrences:   1    Standing Expiration Date:   03/17/2024    Order Specific Question:   Reason for Exam (SYMPTOM  OR DIAGNOSIS REQUIRED)    Answer:   eval pain coccyx    Order Specific Question:   Preferred imaging location?    Answer:   [facility]   No orders of the defined types were placed in this encounter.    Discussed warning signs or symptoms. Please see discharge instructions. Patient expresses understanding.   The above documentation has been reviewed and is accurate and complete Clementeen Graham, M.D.

## 2023-03-18 NOTE — Patient Instructions (Addendum)
Thank you for coming in today.   Please use Voltaren gel (Generic Diclofenac Gel) up to 4x daily for pain as needed.  This is available over-the-counter as both the name brand Voltaren gel and the generic diclofenac gel.   Try using an Coccyx Offloading Cushion  Continue tylneol.   If not better I would suggest pelvic PT. Let me know.   Please get an Xray today before you leave

## 2023-03-26 NOTE — Progress Notes (Signed)
X-ray sacrum/coccyx shows no fracture in the sacrum.  Hip arthritis is present.  You may have had an old fracture in the femur.  This visible abnormality in the left femur is similar to prior CT scan so we do not think it is new and probably not causing her pain.

## 2023-04-09 ENCOUNTER — Encounter: Payer: PPO | Attending: Physician Assistant | Admitting: Physician Assistant

## 2023-04-09 DIAGNOSIS — I10 Essential (primary) hypertension: Secondary | ICD-10-CM | POA: Diagnosis not present

## 2023-04-09 DIAGNOSIS — S51811A Laceration without foreign body of right forearm, initial encounter: Secondary | ICD-10-CM | POA: Insufficient documentation

## 2023-04-18 ENCOUNTER — Encounter: Payer: Self-pay | Admitting: Family Medicine

## 2023-04-22 ENCOUNTER — Telehealth: Payer: Self-pay | Admitting: Gastroenterology

## 2023-04-22 NOTE — Telephone Encounter (Signed)
Patient called states she is having a lot of abdominal pain and sometimes it is sharp\intense pain would like to discuss further with a nurse.

## 2023-04-22 NOTE — Telephone Encounter (Signed)
The pt states that she continues to have periodic abd cramping. She is having regular bowel movements at this time.   She does take the dicyclomine 10 mg two-three times daily.  She is taking pantoprazole 40 mg twice daily. She is not taking the Pepcid however.  She does report that the cramping does resolve with dicyclomine. I have reiterated the recommendations per Dr Ardell Isaacs last office visit.  She will call back if she does not improve or things worsen.      Recommendations    Continue dicyclomine 10 mg tid ac prn, Imodium 1-2 tid prn and avoid dietary stressors Continue pantoprazole 40 mg bid, famotidine 20 mg hs and follow antireflux measures

## 2023-04-23 NOTE — Progress Notes (Signed)
[name], [name] ([id number]) [id number] Page 1 of 7 Visit Report for 04/09/2023 Allergy List Details Patient Name: Date of Service: [name] 04/09/2023 8:45 A M Medical Record Number: [id number] Patient Account Number: [id number] Date of Birth/Sex: Treating RN: [birth year] (77 y.o. [name] Primary Care Ahuva Poynor: Hetty Blend Other Clinician: Referring Janele Lague: Treating Neely Kammerer/Extender: Allen Derry Self, Referral Weeks in Treatment: 0 Allergies Active Allergies epinephrine mold Allergy Notes Electronic Signature(s) Signed: 04/23/2023 11:59:03 AM By: Yevonne Pax RN Entered By: Yevonne Pax on 04/09/2023 05:50:26 -------------------------------------------------------------------------------- Arrival Information Details Patient Name: Date of Service: [name] Scheuermann B. 04/09/2023 8:45 A M Medical Record Number: [id number] Patient Account Number: [id number] Date of Birth/Sex: Treating RN: Feb 10, 1946 (77 y.o. [name] Primary Care Callie Facey: Hetty Blend Other Clinician: Referring Kylon Philbrook: Treating Dekendrick Uzelac/Extender: Allen Derry Self, Referral Weeks in Treatment: 0 Visit Information Patient Arrived: Ambulatory Arrival Time: 08:48 Accompanied By: self Transfer Assistance: None Patient Identification Verified: Yes Secondary Verification Process Completed: Yes Patient Requires Transmission-Based Precautions: No Patient Has Alerts: No Electronic Signature(s) Signed: 04/23/2023 11:59:03 AM By: Yevonne Pax RN Laural Benes, Cecile Hearing ([id number]) [id number] Page 2 of 7 Entered By: Yevonne Pax on 04/09/2023 05:48:43 -------------------------------------------------------------------------------- Clinic Level of Care Assessment Details Patient Name: Date of Service: [name] 04/09/2023 8:45 A M Medical Record Number: [id number] Patient Account Number: [id number] Date of Birth/Sex: Treating  RN: [birth year] (77 y.o. [name] Primary Care Kazaria Gaertner: Hetty Blend Other Clinician: Referring Esthela Brandner: Treating Nasiah Lehenbauer/Extender: Allen Derry Self, Referral Weeks in Treatment: 0 Clinic Level of Care Assessment Items TOOL 2 Quantity Score X- 1 0 Use when only an EandM is performed on the INITIAL visit ASSESSMENTS - Nursing Assessment / Reassessment X- 1 20 General Physical Exam (combine w/ comprehensive assessment (listed just below) when performed on new pt. evals) X- 1 25 Comprehensive Assessment (HX, ROS, Risk Assessments, Wounds Hx, etc.) ASSESSMENTS - Wound and Skin A ssessment / Reassessment X - Simple Wound Assessment / Reassessment - one wound 1 5 []  - 0 Complex Wound Assessment / Reassessment - multiple wounds []  - 0 Dermatologic / Skin Assessment (not related to wound area) ASSESSMENTS - Ostomy and/or Continence Assessment and Care []  - 0 Incontinence Assessment and Management []  - 0 Ostomy Care Assessment and Management (repouching, etc.) PROCESS - Coordination of Care X - Simple Patient / Family Education for ongoing care 1 15 []  - 0 Complex (extensive) Patient / Family Education for ongoing care []  - 0 Staff obtains Chiropractor, Records, T Results / Process Orders est []  - 0 Staff telephones HHA, Nursing Homes / Clarify orders / etc []  - 0 Routine Transfer to another Facility (non-emergent condition) []  - 0 Routine Hospital Admission (non-emergent condition) X- 1 15 New Admissions / Manufacturing engineer / Ordering NPWT Apligraf, etc. , []  - 0 Emergency Hospital Admission (emergent condition) X- 1 10 Simple Discharge Coordination []  - 0 Complex (extensive) Discharge Coordination PROCESS - Special Needs []  - 0 Pediatric / Minor Patient Management []  - 0 Isolation Patient Management []  - 0 Hearing / Language / Visual special needs []  - 0 Assessment of Community assistance (transportation, D/C planning, etc.) []  - 0 Additional  assistance / Altered mentation []  - 0 Support Surface(s) Assessment (bed, cushion, seat, etc.) GLADYES, PERDOMO B ([id number]) [id number] Page 3 of 7 INTERVENTIONS - Wound Cleansing / Measurement []  - 0 Wound Imaging (photographs - any number of wounds) []  - 0 Wound Tracing (instead of photographs) []  -  0 Simple Wound Measurement - one wound []  - 0 Complex Wound Measurement - multiple wounds []  - 0 Simple Wound Cleansing - one wound []  - 0 Complex Wound Cleansing - multiple wounds INTERVENTIONS - Wound Dressings []  - 0 Small Wound Dressing one or multiple wounds []  - 0 Medium Wound Dressing one or multiple wounds []  - 0 Large Wound Dressing one or multiple wounds []  - 0 Application of Medications - injection INTERVENTIONS - Miscellaneous []  - 0 External ear exam []  - 0 Specimen Collection (cultures, biopsies, blood, body fluids, etc.) []  - 0 Specimen(s) / Culture(s) sent or taken to Lab for analysis []  - 0 Patient Transfer (multiple staff / Michiel Sites Lift / Similar devices) []  - 0 Simple Staple / Suture removal (25 or less) []  - 0 Complex Staple / Suture removal (26 or more) []  - 0 Hypo / Hyperglycemic Management (close monitor of Blood Glucose) []  - 0 Ankle / Brachial Index (ABI) - do not check if billed separately Has the patient been seen at the hospital within the last three years: Yes Total Score: 90 Level Of Care: New/Established - Level 3 Electronic Signature(s) Signed: 04/23/2023 11:59:03 AM By: Yevonne Pax RN Entered By: Yevonne Pax on 04/09/2023 06:35:55 -------------------------------------------------------------------------------- Encounter Discharge Information Details Patient Name: Date of Service: [name] Scheuermann B. 04/09/2023 8:45 A M Medical Record Number: [id number] Patient Account Number: [id number] Date of Birth/Sex: Treating RN: 06-29-1946 (77 y.o. [name] Primary Care [name]: Hetty Blend Other  Clinician: Referring Elize Pinon: Treating Merle Cirelli/Extender: Allen Derry Self, Referral Weeks in Treatment: 0 Encounter Discharge Information Items Discharge Condition: Stable Ambulatory Status: Ambulatory Discharge Destination: Home Transportation: Private Auto Accompanied By: self Schedule Follow-up Appointment: Yes Clinical Summary of Care: LINDZEE, TIMPSON ([id number]) [id number] Page 4 of 7 Electronic Signature(s) Signed: 04/09/2023 9:38:07 AM By: Yevonne Pax RN Entered By: Yevonne Pax on 04/09/2023 06:38:07 -------------------------------------------------------------------------------- Lower Extremity Assessment Details Patient Name: Date of Service: [name] 04/09/2023 8:45 A M Medical Record Number: [id number] Patient Account Number: [id number] Date of Birth/Sex: Treating RN: 19-Sep-1945 (77 y.o. [name] Primary Care Tilford Deaton: Hetty Blend Other Clinician: Referring Hamdan Toscano: Treating Damyiah Moxley/Extender: Allen Derry Self, Referral Weeks in Treatment: 0 Electronic Signature(s) Signed: 04/23/2023 11:59:03 AM By: Yevonne Pax RN Entered By: Yevonne Pax on 04/09/2023 05:57:26 -------------------------------------------------------------------------------- Multi Wound Chart Details Patient Name: Date of Service: [name] Scheuermann B. 04/09/2023 8:45 A M Medical Record Number: [id number] Patient Account Number: [id number] Date of Birth/Sex: Treating RN: 12-18-1945 (77 y.o. [name] Primary Care Tavious Griesinger: Hetty Blend Other Clinician: Referring Pharrah Rottman: Treating Corissa Oguinn/Extender: Allen Derry Self, Referral Weeks in Treatment: 0 Vital Signs Height(in): 61 Pulse(bpm): 55 Weight(lbs): 178 Blood Pressure(mmHg): 176/61 Body Mass Index(BMI): 33.6 Temperature(F): 98 Respiratory Rate(breaths/min): 18 [Treatment Notes:Wound Assessments Treatment Notes] Electronic Signature(s) Signed: 04/23/2023 11:59:03 AM By: Yevonne Pax  RN Entered By: Yevonne Pax on 04/09/2023 06:10:00 [name] ([id number]) [id number] Page 5 of 7 -------------------------------------------------------------------------------- Multi-Disciplinary Care Plan Details Patient Name: Date of Service: [name] 04/09/2023 8:45 A M Medical Record Number: [id number] Patient Account Number: [id number] Date of Birth/Sex: Treating RN: Apr 11, 1946 (77 y.o. [name] Primary Care Ave Scharnhorst: Hetty Blend Other Clinician: Referring Junko Ohagan: Treating Kashden Deboy/Extender: Allen Derry Self, Referral Weeks in Treatment: 0 Active Inactive Electronic Signature(s) Signed: 04/09/2023 9:38:56 AM By: Yevonne Pax RN Previous Signature: 04/09/2023 9:36:46 AM Version By: Yevonne Pax RN Entered By: Yevonne Pax on 04/09/2023 06:38:56 -------------------------------------------------------------------------------- Pain Assessment Details  Patient Name: Date of Service: [name] 04/09/2023 8:45 A M Medical Record Number: [id number] Patient Account Number: [id number] Date of Birth/Sex: Treating RN: 1946/01/06 (77 y.o. [name] Primary Care Sharlotte Baka: Hetty Blend Other Clinician: Referring Jmichael Gille: Treating Almena Hokenson/Extender: Allen Derry Self, Referral Weeks in Treatment: 0 Active Problems Location of Pain Severity and Description of Pain Patient Has Paino No Site Locations [place], [place] ([id number]) [id number] Page 6 of 7 Pain Management and Medication Current Pain Management: Electronic Signature(s) Signed: 04/23/2023 11:59:03 AM By: Yevonne Pax RN Entered By: Yevonne Pax on 04/09/2023 05:48:50 -------------------------------------------------------------------------------- Patient/Caregiver Education Details Patient Name: Date of Service: [name] 8/16/2024andnbsp8:45 A M Medical Record Number: [id number] Patient Account Number: [id number] Date  of Birth/Gender: Treating RN: [birth year] (77 y.o. [name] Primary Care Physician: Hetty Blend Other Clinician: Referring Physician: Treating Physician/Extender: Allen Derry Self, Referral Weeks in Treatment: 0 Education Assessment Education Provided To: Patient Education Topics Provided Welcome [facility]New [facility] Packet: o Handouts: Welcome [facility] o Methods: Explain/Verbal Responses: State content correctly Electronic Signature(s) Signed: 04/23/2023 11:59:03 AM By: Yevonne Pax RN Entered By: Yevonne Pax on 04/09/2023 06:37:17 -------------------------------------------------------------------------------- Vitals Details Patient Name: Date of Service: [name] Scheuermann B. 04/09/2023 8:45 A M Medical Record Number: [id number] Patient Account Number: [id number] Date of Birth/Sex: Treating RN: 26-Jun-1946 (77 y.o. [name] Primary Care Mitali Shenefield: Hetty Blend Other Clinician: Referring Shannel Zahm: Treating Aftyn Nott/Extender: Allen Derry Self, Referral Weeks in Treatment: 0 Vital Signs SAKURA, OTEY ([id number]) [id number] Page 7 of 7 Time Taken: 08:48 Temperature (F): 98 Height (in): 61 Pulse (bpm): 55 Source: Stated Respiratory Rate (breaths/min): 18 Weight (lbs): 178 Blood Pressure (mmHg): 176/61 Source: Stated Reference Range: 80 - 120 mg / dl Body Mass Index (BMI): 33.6 Electronic Signature(s) Signed: 04/23/2023 11:59:03 AM By: Yevonne Pax RN Entered By: Yevonne Pax on 04/09/2023 05:49:59

## 2023-04-23 NOTE — Progress Notes (Signed)
SHAWNTEE, FOUNTAIN ([id number]) [id number] Page 1 of 6 Visit Report for 04/09/2023 Chief Complaint Document Details Patient Name: Date of Service: [name] 04/09/2023 8:45 A M Medical Record Number: [id number] Patient Account Number: [id number] Date of Birth/Sex: Treating RN: Jan 19, 1946 (77 y.o. Freddy Finner Primary Care Provider: Hetty Blend Other Clinician: Referring Provider: Treating Provider/Extender: Allen Derry Self, Referral Weeks in Treatment: 0 Information Obtained from: Patient Chief Complaint Right forearm skin tear Electronic Signature(s) Signed: 04/09/2023 9:08:21 AM By: Allen Derry PA-C Entered By: Allen Derry on 04/09/2023 06:08:21 -------------------------------------------------------------------------------- HPI Details Patient Name: Date of Service: [name] 04/09/2023 8:45 A M Medical Record Number: [id number] Patient Account Number: [id number] Date of Birth/Sex: Treating RN: [birth year] (77 y.o. Freddy Finner Primary Care Provider: Hetty Blend Other Clinician: Referring Provider: Treating Provider/Extender: Allen Derry Self, Referral Weeks in Treatment: 0 History of Present Illness HPI Description: 04-09-2023 upon evaluation today patient presents for initial inspection here in our clinic concerning issues that she has been having with a skin tear on her right forearm. The good news is this actually appears to be healed. She is the sister of one of my other patients and his wife has been taking care of this using Xeroform she reapproximated the edges of the wound and has been wrapping this in an excellent fashion. T be honest they really o legitimately did my job for me everything looks to be pretty much healed today although I think she does need to be careful with this but otherwise she is doing awesome. Patient does have a history of hypertension and depression but otherwise no major medical problems.  This initial injury occurred 2 weeks ago. Electronic Signature(s) Signed: 04/09/2023 10:25:23 AM By: Allen Derry PA-C Entered By: Allen Derry on 04/09/2023 07:25:23 [name] ([id number]) [id number] Page 2 of 6 -------------------------------------------------------------------------------- Physical Exam Details Patient Name: Date of Service: [name] 04/09/2023 8:45 A M Medical Record Number: [id number] Patient Account Number: [id number] Date of Birth/Sex: Treating RN: 27-May-1946 (77 y.o. Freddy Finner Primary Care Provider: Hetty Blend Other Clinician: Referring Provider: Treating Provider/Extender: Allen Derry Self, Referral Weeks in Treatment: 0 Constitutional patient is hypertensive.. pulse regular and within target range for patient.[name] respirations regular, non-labored and within target range for patient.[name] temperature within target range for patient.. Obese and well-hydrated in no acute distress. Eyes conjunctiva clear no eyelid edema noted. pupils equal round and reactive to light and accommodation. Ears, Nose, Mouth, and Throat no gross abnormality of ear auricles or external auditory canals. normal hearing noted during conversation. mucus membranes moist. Respiratory normal breathing without difficulty. Musculoskeletal normal gait and posture. no significant deformity or arthritic changes, no loss or range of motion, no clubbing. Psychiatric this patient is able to make decisions and demonstrates good insight into disease process. Alert and Oriented x 3. pleasant and cooperative. Notes Upon inspection patient's wound actually appeared on evaluation today to be doing well. In fact I think this is completely healed based on what I am seeing. I do not see any evidence of worsening overall and I think that in general they did a great job reapproximating the wound and then using the Xeroform which has really done awesome for  her. Electronic Signature(s) Signed: 04/09/2023 10:25:46 AM By: Allen Derry PA-C Entered By: Allen Derry on 04/09/2023 07:25:46 -------------------------------------------------------------------------------- Physician Orders Details Patient Name: Date of Service: [name] 04/09/2023 8:45 A M Medical Record Number:  [id number] Patient Account Number: [id number] Date of Birth/Sex: Treating RN: 11-07-1945 (77 y.o. Freddy Finner Primary Care Provider: Hetty Blend Other Clinician: Referring Provider: Treating Provider/Extender: Allen Derry Self, Referral Weeks in Treatment: 0 Verbal / Phone Orders: No Diagnosis Coding PHEBA, TAKADA ([id number]) [id number] Page 3 of 6 ICD-10 Coding Code Description [id number] Laceration without foreign body of right forearm, initial encounter I10 Essential (primary) hypertension F32.A Depression, unspecified Discharge From [facility] Services Consult Only Electronic Signature(s) Signed: 04/09/2023 3:00:55 PM By: Allen Derry PA-C Signed: 04/23/2023 11:59:03 AM By: Yevonne Pax RN Entered By: Yevonne Pax on 04/09/2023 06:10:51 -------------------------------------------------------------------------------- Problem List Details Patient Name: Date of Service: [name] 04/09/2023 8:45 A M Medical Record Number: [id number] Patient Account Number: [id number] Date of Birth/Sex: Treating RN: 1945/12/30 (77 y.o. Freddy Finner Primary Care Provider: Hetty Blend Other Clinician: Referring Provider: Treating Provider/Extender: Allen Derry Self, Referral Weeks in Treatment: 0 Active Problems ICD-10 Encounter Code Description Active Date MDM Diagnosis S51.811A Laceration without foreign body of right forearm, initial encounter 04/09/2023 No Yes I10 Essential (primary) hypertension 04/09/2023 No Yes F32.A Depression, unspecified 04/09/2023 No Yes Inactive Problems Resolved Problems Electronic  Signature(s) Signed: 04/09/2023 9:07:55 AM By: Allen Derry PA-C Entered By: Allen Derry on 04/09/2023 06:07:55 [name] ([id number]) [id number] Page 4 of 6 -------------------------------------------------------------------------------- Progress Note Details Patient Name: Date of Service: [name] 04/09/2023 8:45 A M Medical Record Number: [id number] Patient Account Number: [id number] Date of Birth/Sex: Treating RN: 02-29-1946 (77 y.o. Freddy Finner Primary Care Provider: Hetty Blend Other Clinician: Referring Provider: Treating Provider/Extender: Allen Derry Self, Referral Weeks in Treatment: 0 Subjective Chief Complaint Information obtained from Patient Right forearm skin tear History of Present Illness (HPI) 04-09-2023 upon evaluation today patient presents for initial inspection here in our clinic concerning issues that she has been having with a skin tear on her right forearm. The good news is this actually appears to be healed. She is the sister of one of my other patients and his wife has been taking care of this using Xeroform she reapproximated the edges of the wound and has been wrapping this in an excellent fashion. T be honest they really legitimately did my job for o me everything looks to be pretty much healed today although I think she does need to be careful with this but otherwise she is doing awesome. Patient does have a history of hypertension and depression but otherwise no major medical problems. This initial injury occurred 2 weeks ago. Patient History Allergies epinephrine, mold Social History Never smoker, Marital Status - Widowed, Alcohol Use - Never, Drug Use - No History, Caffeine Use - Moderate. Medical History Cardiovascular Patient has history of Hypertension Medical A Surgical History Notes nd Psychiatric depression Review of Systems (ROS) Integumentary (Skin) Complains or has symptoms of  Wounds. Objective Constitutional patient is hypertensive.. pulse regular and within target range for patient.[name] respirations regular, non-labored and within target range for patient.[name] temperature within target range for patient.. Obese and well-hydrated in no acute distress. Vitals Time Taken: 8:48 AM, Height: 61 in, Source: Stated, Weight: 178 lbs, Source: Stated, BMI: 33.6, Temperature: 98 F, Pulse: 55 bpm, Respiratory Rate: 18 breaths/min, Blood Pressure: 176/61 mmHg. Eyes conjunctiva clear no eyelid edema noted. pupils equal round and reactive to light and accommodation. Ears, Nose, Mouth, and Throat no gross abnormality of ear auricles or external auditory canals. normal hearing noted during conversation. mucus membranes moist. Respiratory normal breathing without difficulty. Musculoskeletal  ALLISAN, TVEDT ([id number]) [id number] Page 5 of 6 normal gait and posture. no significant deformity or arthritic changes, no loss or range of motion, no clubbing. Psychiatric this patient is able to make decisions and demonstrates good insight into disease process. Alert and Oriented x 3. pleasant and cooperative. General Notes: Upon inspection patient's wound actually appeared on evaluation today to be doing well. In fact I think this is completely healed based on what I am seeing. I do not see any evidence of worsening overall and I think that in general they did a great job reapproximating the wound and then using the Xeroform which has really done awesome for her. Assessment Active Problems ICD-10 Laceration without foreign body of right forearm, initial encounter Essential (primary) hypertension Depression, unspecified Plan Discharge From [facility] Services: Consult Only 1. I would recommend that we have the patient continue to monitor for any signs of infection or worsening. Based on what I am seeing I do believe that we make an excellent headway towards complete  closure. 2. I am going to recommend as well that the patient should continue to elevate her legs is much as possible. We discussed with her that her legs are very swollen she does have compression stockings and I think that definitely this is something that she should try to manage well as far as the compression stockings are concerned. She needs to be wearing those on a regular basis and she voiced understanding. Will see her back for follow-up visit as needed. Electronic Signature(s) Signed: 04/09/2023 10:26:26 AM By: Allen Derry PA-C Entered By: Allen Derry on 04/09/2023 07:26:26 -------------------------------------------------------------------------------- ROS/PFSH Details Patient Name: Date of Service: [name] 04/09/2023 8:45 A M Medical Record Number: [id number] Patient Account Number: [id number] Date of Birth/Sex: Treating RN: 1946/04/05 (77 y.o. Freddy Finner Primary Care Provider: Hetty Blend Other Clinician: Referring Provider: Treating Provider/Extender: Allen Derry Self, Referral Weeks in Treatment: 0 Integumentary (Skin) Complaints and Symptoms: Positive for: Wounds Cardiovascular Medical History: Positive for: Hypertension SHARITY, FERRANTE ([id number]) [id number] Page 6 of 6 Psychiatric Medical History: Past Medical History Notes: depression Immunizations Pneumococcal Vaccine: Received Pneumococcal Vaccination: No Implantable Devices None Family and Social History Never smoker; Marital Status - Widowed; Alcohol Use: Never; Drug Use: No History; Caffeine Use: Moderate Electronic Signature(s) Signed: 04/09/2023 3:00:55 PM By: Allen Derry PA-C Signed: 04/23/2023 11:59:03 AM By: Yevonne Pax RN Entered By: Yevonne Pax on 04/09/2023 05:52:02 -------------------------------------------------------------------------------- SuperBill Details Patient Name: Date of Service: [name] 04/09/2023 Medical Record  Number: [id number] Patient Account Number: [id number] Date of Birth/Sex: Treating RN: 1946-07-18 (77 y.o. Freddy Finner Primary Care Provider: Hetty Blend Other Clinician: Referring Provider: Treating Provider/Extender: Allen Derry Self, Referral Weeks in Treatment: 0 Diagnosis Coding ICD-10 Codes Code Description [phone] Laceration without foreign body of right forearm, initial encounter I10 Essential (primary) hypertension F32.A Depression, unspecified Facility Procedures : CPT4 Code: [id number] Description: 99213 - WOUND CARE VISIT-LEV 3 EST PT Modifier: Quantity: 1 Physician Procedures : CPT4 Code Description Modifier [id number] WC PHYS LEVEL 3 NEW PT ICD-10 Diagnosis Description S51.811A Laceration without foreign body of right forearm, initial encounter I10 Essential (primary) hypertension F32.A Depression, unspecified Quantity: 1 Electronic Signature(s) Signed: 04/09/2023 10:26:42 AM By: Allen Derry PA-C Previous Signature: 04/09/2023 9:35:59 AM Version By: Yevonne Pax RN Entered By: Allen Derry on 04/09/2023 07:26:41

## 2023-04-23 NOTE — Progress Notes (Signed)
[name], [name] ([id number]) [id number] Page 1 of 5 Visit Report for 04/09/2023 Abuse Risk Screen Details Patient Name: Date of Service: [name] 04/09/2023 8:45 A M Medical Record Number: [id number] Patient Account Number: [id number] Date of Birth/Sex: Treating RN: 08/21/1946 (77 y.o. Freddy Finner Primary Care Sherryn Pollino: Hetty Blend Other Clinician: Referring Johnelle Tafolla: Treating Page Lancon/Extender: Allen Derry Self, Referral Weeks in Treatment: 0 Abuse Risk Screen Items Answer ABUSE RISK SCREEN: Has anyone close to you tried to hurt or harm you recentlyo No Do you feel uncomfortable with anyone in your familyo No Has anyone forced you do things that you didnt want to doo No Electronic Signature(s) Signed: 04/23/2023 11:59:03 AM By: Yevonne Pax RN Entered By: Yevonne Pax on 04/09/2023 05:52:12 -------------------------------------------------------------------------------- Activities of Daily Living Details Patient Name: Date of Service: [name] 04/09/2023 8:45 A M Medical Record Number: [id number] Patient Account Number: [id number] Date of Birth/Sex: Treating RN: 1946-02-07 (77 y.o. Freddy Finner Primary Care Kaiana Marion: Hetty Blend Other Clinician: Referring Orma Cheetham: Treating Bertram Haddix/Extender: Allen Derry Self, Referral Weeks in Treatment: 0 Activities of Daily Living Items Answer Activities of Daily Living (Please select one for each item) Drive Automobile Completely Able T Medications ake Completely Able Use T elephone Completely Able Care for Appearance Completely Able Use T oilet Completely Able Bath / Shower Completely Able Dress Self Completely Able Feed Self Completely Able Walk Completely Able Get In / Out Bed Completely Able Housework Completely [name], [name] ([id number]) [phone] [id number] Page 2 of 5 Prepare Meals Completely Able Handle Money Completely  Able Shop for Self Completely Able Electronic Signature(s) Signed: 04/23/2023 11:59:03 AM By: Yevonne Pax RN Entered By: Yevonne Pax on 04/09/2023 05:52:34 -------------------------------------------------------------------------------- Education Screening Details Patient Name: Date of Service: [name] Scheuermann [name]. 04/09/2023 8:45 A M Medical Record Number: [id number] Patient Account Number: [id number] Date of Birth/Sex: Treating RN: 1946/05/09 (77 y.o. Freddy Finner Primary Care Zebulon Gantt: Hetty Blend Other Clinician: Referring Shiraz Bastyr: Treating Beatris Belen/Extender: Allen Derry Self, Referral Weeks in Treatment: 0 Primary Learner Assessed: Patient Learning Preferences/Education Level/Primary Language Learning Preference: Explanation Highest Education Level: College or Above Preferred Language: English Cognitive Barrier Language Barrier: No Translator Needed: No Memory Deficit: No Emotional Barrier: No Cultural/Religious Beliefs Affecting Medical Care: No Physical Barrier Impaired Vision: Yes Contacts Impaired Hearing: No Decreased Hand dexterity: No Knowledge/Comprehension Knowledge Level: Medium Comprehension Level: High Ability to understand written instructions: High Ability to understand verbal instructions: High Motivation Anxiety Level: Anxious Cooperation: Cooperative Education Importance: Acknowledges Need Interest in Health Problems: Asks Questions Perception: Coherent Willingness to Engage in Self-Management High Activities: Readiness to Engage in Self-Management High Activities: Electronic Signature(s) Signed: 04/23/2023 11:59:03 AM By: Yevonne Pax RN Entered By: Yevonne Pax on 04/09/2023 05:53:22 [name] ([id number]) [id number] Page 3 of 5 -------------------------------------------------------------------------------- Fall Risk Assessment Details Patient Name: Date of Service: [name]  04/09/2023 8:45 A M Medical Record Number: [id number] Patient Account Number: [id number] Date of Birth/Sex: Treating RN: Mar 23, 1946 (77 y.o. Freddy Finner Primary Care Malayna Noori: Hetty Blend Other Clinician: Referring Domonique Brouillard: Treating Jailyne Chieffo/Extender: Allen Derry Self, Referral Weeks in Treatment: 0 Fall Risk Assessment Items Have you had 2 or more falls in the last 12 monthso 0 No Have you had any fall that resulted in injury in the last 12 monthso 0 No FALLS RISK SCREEN History of falling - immediate or within 3 months 0 No Secondary diagnosis (Do you have 2 or  more medical diagnoseso) 0 No Ambulatory aid None/bed rest/wheelchair/nurse 0 Yes Crutches/cane/walker 0 No Furniture 0 No Intravenous therapy Access/Saline/Heparin Lock 0 No Gait/Transferring Normal/ bed rest/ wheelchair 0 Yes Weak (short steps with or without shuffle, stooped but able to lift head while walking, may seek 0 No support from furniture) Impaired (short steps with shuffle, may have difficulty arising from chair, head down, impaired 0 No balance) Mental Status Oriented to own ability 0 Yes Electronic Signature(s) Signed: 04/23/2023 11:59:03 AM By: Yevonne Pax RN Entered By: Yevonne Pax on 04/09/2023 05:53:34 -------------------------------------------------------------------------------- Foot Assessment Details Patient Name: Date of Service: [name] Scheuermann [name]. 04/09/2023 8:45 A M Medical Record Number: [id number] Patient Account Number: [id number] Date of Birth/Sex: Treating RN: [birth year] (77 y.o. Freddy Finner Primary Care Anmarie Fukushima: Hetty Blend Other Clinician: Referring Taheem Fricke: Treating Layal Javid/Extender: Allen Derry Self, Referral Weeks in Treatment: 0 Foot Assessment Items Site Locations [place], [place] [name] ([id number]) [id number] Page 4 of 5 + = Sensation present, - = Sensation absent, C = Callus, U = Ulcer R = Redness, W = Warmth, M = Maceration, PU  = Pre-ulcerative lesion F = Fissure, S = Swelling, D = Dryness Assessment Right: Left: Other Deformity: No No Prior Foot Ulcer: No No Prior Amputation: No No Charcot Joint: No No Ambulatory Status: Ambulatory Without Help Gait: Steady Electronic Signature(s) Signed: 04/23/2023 11:59:03 AM By: Yevonne Pax RN Entered By: Yevonne Pax on 04/09/2023 05:53:54 -------------------------------------------------------------------------------- Nutrition Risk Screening Details Patient Name: Date of Service: [name] 04/09/2023 8:45 A M Medical Record Number: [id number] Patient Account Number: [id number] Date of Birth/Sex: Treating RN: May 05, 1946 (77 y.o. Freddy Finner Primary Care Liana Camerer: Hetty Blend Other Clinician: Referring Milarose Savich: Treating Justyce Yeater/Extender: Allen Derry Self, Referral Weeks in Treatment: 0 Height (in): 61 Weight (lbs): 178 Body Mass Index (BMI): 33.6 Nutrition Risk Screening Items Score Screening NUTRITION RISK SCREEN: I have an illness or condition that made me change the kind and/or amount of food I eat 0 No I eat fewer than two meals per day 0 No I eat few fruits and vegetables, or milk products 0 No I have three or more drinks of beer, liquor or wine almost every day 0 No I have tooth or mouth problems that make it hard for me to eat 0 No I don't always have enough money to buy the food I need 0 No [name], [name] ([id number]) [phone] [id number] Page 5 of 5 I eat alone most of the time 0 No I take three or more different prescribed or over-the-counter drugs a day 1 Yes Without wanting to, I have lost or gained 10 pounds in the last six months 0 No I am not always physically able to shop, cook and/or feed myself 0 No Nutrition Protocols Good Risk Protocol 0 No interventions needed Moderate Risk Protocol High Risk Proctocol Risk Level: Good Risk Score: 1 Electronic Signature(s) Signed: 04/23/2023 11:59:03 AM By:  Yevonne Pax RN Entered By: Yevonne Pax on 04/09/2023 05:53:42

## 2023-04-25 ENCOUNTER — Encounter (HOSPITAL_COMMUNITY): Payer: Self-pay

## 2023-04-25 ENCOUNTER — Ambulatory Visit (HOSPITAL_COMMUNITY)
Admission: RE | Admit: 2023-04-25 | Discharge: 2023-04-25 | Disposition: A | Discharge status: Home / Self Care | Payer: PPO | Source: Ambulatory Visit | Attending: Nurse Practitioner | Admitting: Nurse Practitioner

## 2023-04-25 VITALS — BP 141/53 | HR 56 | Temp 98.0°F | Resp 18 | Ht 62.0 in | Wt 178.0 lb

## 2023-04-25 DIAGNOSIS — R1030 Lower abdominal pain, unspecified: Secondary | ICD-10-CM | POA: Diagnosis present

## 2023-04-25 LAB — POCT URINALYSIS DIP (MANUAL ENTRY)
Glucose, UA: 100 mg/dL — AB
Nitrite, UA: NEGATIVE
Protein Ur, POC: 30 mg/dL — AB
Spec Grav, UA: >=1.03 — AB (ref 1.010–1.025)
Urobilinogen, UA: 2 U/dL — AB
pH, UA: 6 (ref 5.0–8.0)

## 2023-04-25 LAB — COMPREHENSIVE METABOLIC PANEL
ALT: 15 U/L (ref 0–44)
AST: 15 U/L (ref 15–41)
Albumin: 3.3 g/dL — ABNORMAL LOW (ref 3.5–5.0)
Alkaline Phosphatase: 48 U/L (ref 38–126)
Anion gap: 11 (ref 5–15)
BUN: 10 mg/dL (ref 8–23)
CO2: 27 mmol/L (ref 22–32)
Calcium: 8.9 mg/dL (ref 8.9–10.3)
Chloride: 103 mmol/L (ref 98–111)
Creatinine, Ser: 0.65 mg/dL (ref 0.44–1.00)
GFR, Estimated: >60 mL/min (ref >60)
Glucose, Bld: 100 mg/dL — ABNORMAL HIGH (ref 70–99)
Potassium: 3.6 mmol/L (ref 3.5–5.1)
Sodium: 141 mmol/L (ref 135–145)
Total Bilirubin: 0.2 mg/dL — ABNORMAL LOW (ref 0.3–1.2)
Total Protein: 6.7 g/dL (ref 6.5–8.1)

## 2023-04-25 LAB — CBC WITH DIFFERENTIAL/PLATELET
Abs Immature Granulocytes: 0.03 10*3/uL (ref 0.00–0.07)
Basophils Absolute: 0 10*3/uL (ref 0.0–0.1)
Basophils Relative: 1 %
Eosinophils Absolute: 0.1 10*3/uL (ref 0.0–0.5)
Eosinophils Relative: 1 %
HCT: 37.5 % (ref 36.0–46.0)
Hemoglobin: 12.3 g/dL (ref 12.0–15.0)
Immature Granulocytes: 0 %
Lymphocytes Relative: 25 %
Lymphs Abs: 1.9 10*3/uL (ref 0.7–4.0)
MCH: 30.8 pg (ref 26.0–34.0)
MCHC: 32.8 g/dL (ref 30.0–36.0)
MCV: 94 fL (ref 80.0–100.0)
Monocytes Absolute: 0.7 10*3/uL (ref 0.1–1.0)
Monocytes Relative: 9 %
Neutro Abs: 5.1 10*3/uL (ref 1.7–7.7)
Neutrophils Relative %: 64 %
Platelets: 377 10*3/uL (ref 150–400)
RBC: 3.99 MIL/uL (ref 3.87–5.11)
RDW: 13.1 % (ref 11.5–15.5)
WBC: 7.9 10*3/uL (ref 4.0–10.5)
nRBC: 0 % (ref 0.0–0.2)

## 2023-04-25 LAB — LIPASE, BLOOD: Lipase: 21 U/L (ref 11–51)

## 2023-04-25 NOTE — ED Provider Notes (Signed)
[facility]    CSN: [id number] Arrival date & time: 04/25/23  1257      History   Chief Complaint Chief Complaint  Patient presents with   Abdominal Pain   Appointment    HPI [name] is a 77 y.o. female.   Patient presents today with 4-day history of lower abdominal pain that comes and goes, is sudden, and a sharp shooting pain.  She reports the pain has been as bad as a 6 out of 10, is currently not in any pain.  Reports when the pain comes on, it last for couple of minutes and then gets better.  She denies radiation of pain around to her back or down her legs.  No fevers, nausea/vomiting, constipation, blood in the stool, heartburn, rash, dysuria/urinary frequency or urgency, and hematuria.  She does endorse decreased appetite and diarrhea.  Reports history of IBS, has diarrhea at baseline and diarrhea is not much changed from baseline but is a little bit more frequent.  Reports she contacted her gastroenterologist last week who recommended increasing Protonix to twice daily and Pepcid as well.  She thinks this is helped a little bit.  Reports she typically develops the pain right before she has an episode of diarrhea.    Past Medical History:  Diagnosis Date   10 year risk of MI or stroke 7.5% or greater 02/11/2022   ASCVD 10 yr risk 22.7%  Recommend starting statin    Allergic rhinitis    Allergy    Anxiety    Anxiety and depression    Arthritis    Asthma    years ago-1993-94   Barrett's esophagus 05/2005   Blood transfusion without reported diagnosis    as baby   Cancer (HCC)    Basal cell face   Cataract    bilateral removal   Depression    Diverticulosis    Esophageal stricture    Gastric polyps    GERD (gastroesophageal reflux disease)    Hiatal hernia    Hypertension    on meds   Morbid obesity (HCC)    OSA (obstructive sleep apnea)    Osteoporosis    Pneumonia    many years ago   Positive PPD    Sleep apnea    bi pap   Ulcer      Patient Active Problem List   Diagnosis Date Noted   Family history of heart disease in female family member before age 76 02/17/2023   Statin intolerance 02/17/2023   Encounter for general adult medical examination with abnormal findings 02/17/2023   10 year risk of MI or stroke 7.5% or greater 02/11/2022   Obesity (BMI 30.0-34.9) 02/10/2022   Neoplasm of skin 10/16/2021   Impingement syndrome of left shoulder region 01/30/2020   Impingement syndrome of right shoulder region 01/30/2020   Osteoporosis 01/01/2020   Abnormal bruising 12/13/2019   Viral illness 07/27/2019   Nonspecific syndrome suggestive of viral illness 07/17/2019   Cellulitis 06/05/2019   Referred otalgia of left ear 01/03/2019   Chronic tension headache 08/03/2018   Pain in finger of left hand 07/01/2018   Dysphonia 06/07/2018   Chronic hoarseness 05/20/2018   Biliary pain    Common bile duct stone    Elevated LFTs 09/14/2017   Epigastric pain 09/13/2017   Laceration of nail bed of finger 08/28/2017   Localized, primary osteoarthritis of shoulder region 08/28/2017   Trigger finger of left hand 08/28/2017   Memory changes 03/25/2017  Venous stasis dermatitis of both lower extremities 08/20/2016   Chest pain 07/20/2016   Leg swelling 03/29/2016   Abdominal pain 03/02/2016   Shingles 04/24/2015   Special screening for malignant neoplasms, colon 12/20/2013   Health care maintenance 05/22/2013   Hypokalemia 04/12/2013   Depression 04/12/2013   Other malaise and fatigue 03/16/2013   Hip pain 07/29/2012   Neck pain 05/17/2012   Essential hypertension 01/15/2012   Osteopenia 05/25/2011   Tick bite of neck 04/07/2011   Vitamin D deficiency 06/03/2010   HYPERCHOLESTEROLEMIA 08/03/2008   Diverticulosis of colon 02/01/2008   RHINITIS, CHRONIC 01/31/2008   Morbid obesity due to excess calories (HCC) complicated by hbp/ djd  11/03/2007   Obstructive sleep apnea 11/03/2007   GERD 11/03/2007   POSITIVE PPD  11/03/2007    Past Surgical History:  Procedure Laterality Date   CESAREAN SECTION     x 2   CHOLECYSTECTOMY     COLONOSCOPY     ENDOSCOPIC RETROGRADE CHOLANGIOPANCREATOGRAPHY (ERCP) WITH PROPOFOL N/A 09/17/2017   Procedure: ENDOSCOPIC RETROGRADE CHOLANGIOPANCREATOGRAPHY (ERCP) WITH PROPOFOL;  Surgeon: Meryl Dare, MD;  Location: [facility] ENDOSCOPY;  Service: Endoscopy;  Laterality: N/A;   EYE SURGERY     Cataracts   FINGER SURGERY     cyst removel from right index finger   FOOT FRACTURE SURGERY     right    FOOT NEUROMA SURGERY     left    FRACTURE SURGERY     Rt foot. Rt arm   HERNIA REPAIR     Umbilical   TONSILLECTOMY     TUBAL LIGATION  08/25/1979   UPPER GASTROINTESTINAL ENDOSCOPY     WRIST FRACTURE SURGERY     right     OB History   No obstetric history on file.      Home Medications    Prior to Admission medications   Medication Sig Start Date End Date Taking? Authorizing Provider  acetaminophen (TYLENOL) 325 MG tablet Take 650 mg by mouth every 6 (six) hours as needed. For pain   Yes [provider]  ALPRAZolam Prudy Feeler) 0.25 MG tablet For anxiety 1  q day 03/17/23  Yes Plovsky, Earvin Hansen, MD  buPROPion (WELLBUTRIN XL) 300 MG 24 hr tablet Take 1 tablet (300 mg total) by mouth daily. 08/05/22  Yes Plovsky, Earvin Hansen, MD  Calcium Carbonate-Vitamin D 600-400 MG-UNIT per tablet Take 1 tablet by mouth daily.   Yes [provider]  colestipol (COLESTID) 1 g tablet Take 1 tablet (1 g total) by mouth 2 (two) times daily. 04/13/22  Yes Quentin Mulling R, PA-C  dicyclomine (BENTYL) 20 MG tablet TAKE 1/2 TABLET BY MOUTH 3 TIMES DAILY AS NEEDED FOR SPASMS. 03/10/23  Yes Meryl Dare, MD  famotidine (PEPCID) 20 MG tablet Take 1 tablet (20 mg total) by mouth at bedtime as needed. For allergies 03/10/23  Yes Meryl Dare, MD  fluticasone [facility]) 50 MCG/ACT nasal spray INSTILL 2 SPRAYS IN EACH NOSTRIL ONCE A DAY. 03/09/23  Yes Henson, Vickie L, NP-C  Multiple  Vitamin (MULTIVITAMIN WITH MINERALS) TABS tablet Take 1 tablet by mouth daily.   Yes [provider]  MYRBETRIQ 50 MG TB24 tablet Take 50 mg by mouth daily. 03/11/22  Yes [provider]  pantoprazole (PROTONIX) 40 MG tablet TAKE 1 TABLET BY MOUTH TWICE A DAY BEFORE BREAKFAST AND DINNER 03/10/23  Yes Meryl Dare, MD  tolterodine (DETROL LA) 4 MG 24 hr capsule Take 4 mg by mouth at bedtime.  Yes [provider]  Vilazodone HCl (VIIBRYD) 40 MG TABS Take 1 tablet (40 mg total) by mouth daily. 03/17/23  Yes Plovsky, Earvin Hansen, MD    Family History Family History  Problem Relation Age of Onset   Allergies Mother    Alzheimer's disease Mother    Heart failure Mother    Arthritis Mother    Hearing loss Mother    Depression Mother    Coronary artery disease Father    Heart failure Father    Early death Father    Heart disease Father    Obesity Father    Hypertension Brother        x 3   Diabetes Brother    Heart disease Brother    Obesity Brother    Sleep apnea Brother    Heart disease Brother    Stroke Brother    Heart attack Paternal Grandmother    Heart disease Paternal Grandmother    Lung cancer Paternal Grandfather    Cancer Paternal Grandfather    Sleep apnea Son    Diabetes Son    Early death Son    Colon cancer Maternal Uncle    Hypertension Brother    Rectal cancer Neg Hx    Stomach cancer Neg Hx    Esophageal cancer Neg Hx    Pancreatic cancer Neg Hx    Colon polyps Neg Hx     Social History Social History   Tobacco Use   Smoking status: Never   Smokeless tobacco: Never   Tobacco comments:    pt has never smoked but husband smoked  Vaping Use   Vaping status: Never Used  Substance Use Topics   Alcohol use: Not Currently   Drug use: Never     Allergies   Molds & smuts, Epinephrine, and Other   Review of Systems Review of Systems Per HPI  Physical Exam Triage Vital Signs ED Triage Vitals  Encounter Vitals Group     BP  04/25/23 1320 (!) 141/53     Systolic BP Percentile --      Diastolic BP Percentile --      Pulse Rate 04/25/23 1320 (!) 56     Resp 04/25/23 1320 18     Temp 04/25/23 1320 98 F (36.7 C)     Temp Source 04/25/23 1320 Oral     SpO2 04/25/23 1320 97 %     Weight 04/25/23 1320 178 lb (80.7 kg)     Height 04/25/23 1320 5\' 2"  (1.575 m)     Head Circumference --      Peak Flow --      Pain Score 04/25/23 1318 5     Pain Loc --      Pain Education --      Exclude from Growth Chart --    No data found.  Updated Vital Signs BP (!) 141/53 (BP Location: Right Arm)   Pulse (!) 56   Temp 98 F (36.7 C) (Oral)   Resp 18   Ht 5\' 2"  (1.575 m)   Wt 178 lb (80.7 kg)   SpO2 97%   BMI 32.56 kg/m   Visual Acuity Right Eye Distance:   Left Eye Distance:   Bilateral Distance:    Right Eye Near:   Left Eye Near:    Bilateral Near:     Physical Exam Vitals and nursing note reviewed.  Constitutional:      General: She is not in acute distress.    Appearance: Normal  appearance. She is not toxic-appearing.  HENT:     Head: Normocephalic and atraumatic.     Mouth/Throat:     Mouth: Mucous membranes are moist.     Pharynx: Oropharynx is clear.  Eyes:     General: No scleral icterus.    Extraocular Movements: Extraocular movements intact.  Cardiovascular:     Rate and Rhythm: Normal rate and regular rhythm.  Pulmonary:     Effort: Pulmonary effort is normal. No respiratory distress.     Breath sounds: Normal breath sounds. No wheezing, rhonchi or rales.  Abdominal:     General: Abdomen is flat. Bowel sounds are normal. There is no distension.     Palpations: Abdomen is soft.     Tenderness: There is no abdominal tenderness. There is no right CVA tenderness, left CVA tenderness, guarding or rebound. Negative signs include Murphy's sign, Rovsing's sign and McBurney's sign.  Musculoskeletal:     Cervical back: Normal range of motion.  Lymphadenopathy:     Cervical: No cervical  adenopathy.  Skin:    General: Skin is warm and dry.     Capillary Refill: Capillary refill takes less than 2 seconds.     Coloration: Skin is not jaundiced or pale.     Findings: No erythema.  Neurological:     Mental Status: She is alert and oriented to person, place, and time.  Psychiatric:        Behavior: Behavior is cooperative.      UC Treatments / Results  Labs (all labs ordered are listed, but only abnormal results are displayed) Labs Reviewed  POCT URINALYSIS DIP (MANUAL ENTRY) - Abnormal; Notable for the following components:      Result Value   Glucose, UA =100 (*)    Bilirubin, UA small (*)    Ketones, POC UA trace (5) (*)    Spec Grav, UA >=1.030 (*)    Blood, UA small (*)    Protein Ur, POC =30 (*)    Urobilinogen, UA 2.0 (*)    Leukocytes, UA Trace (*)    All other components within normal limits  CBC WITH DIFFERENTIAL/PLATELET  LIPASE, BLOOD  COMPREHENSIVE METABOLIC PANEL    EKG   Radiology No results found.  Procedures Procedures (including critical care time)  Medications Ordered in UC Medications - No data to display  Initial Impression / Assessment and Plan / UC Course  I have reviewed the triage vital signs and the nursing notes.  Pertinent labs & imaging results that were available during my care of the patient were reviewed by me and considered in my medical decision making (see chart for details).   Patient is well-appearing, normotensive, afebrile, not tachycardic, not tachypneic, oxygenating well on room air.    1. Lower abdominal pain No red flags in history or on exam; vital signs are reassuring today Suspect possible viral gastroenteritis; encouraged use of dicyclomine to help with lower abdominal pain, however it sounds like this is much improved from days past and almost resolved at this point Will check CBC, CMP, and lipase to rule out metabolic abnormality Strict ER precautions reviewed with patient Recommended follow-up with  PCP and/or gastroenterology if symptoms do not fully improve in the next 2 to 3 days  The patient was given the opportunity to ask questions.  All questions answered to their satisfaction.  The patient is in agreement to this plan.    Final Clinical Impressions(s) / UC Diagnoses   Final diagnoses:  Lower abdominal  pain     Discharge Instructions      As we discussed, I wonder if your lower abdominal pain is coming from the increase in diarrhea which may be a viral stomach bug.  It sounds like this is much improved today, however if it worsens and you develop severe nausea/vomiting, and/or diarrhea, please seek care in the emergency room.  Continue to hydrate with plenty of water or sugar-free electrolytes.  We will contact you if any of the blood work from today comes back  abnormal.  Recommend close follow-up with primary care provider and/or gastroenterologist if symptoms do not improve.     ED Prescriptions   None    PDMP not reviewed this encounter.   Valentino Nose, NP 04/25/23 1430

## 2023-04-25 NOTE — ED Triage Notes (Signed)
"  Pain in lower abdomen started Thursday.  Dr.Stark increased my protonx & pepcide. Nothing has changed. - Entered by patient"  Started last Thursday, was unable to get in to see GI and PCP. Pain in the lower abdomin. No urinary symptoms, no constipation. No nausea or emesis. Slight diarrhea but this is not new for the Patient.

## 2023-04-25 NOTE — Discharge Instructions (Addendum)
As we discussed, I wonder if your lower abdominal pain is coming from the increase in diarrhea which may be a viral stomach bug.  It sounds like this is much improved today, however if it worsens and you develop severe nausea/vomiting, and/or diarrhea, please seek care in the emergency room.  Continue to hydrate with plenty of water or sugar-free electrolytes.  We will contact you if any of the blood work from today comes back  abnormal.  Recommend close follow-up with primary care provider and/or gastroenterologist if symptoms do not improve.

## 2023-04-27 ENCOUNTER — Ambulatory Visit (HOSPITAL_BASED_OUTPATIENT_CLINIC_OR_DEPARTMENT_OTHER): Payer: PPO | Admitting: Pulmonary Disease

## 2023-05-03 ENCOUNTER — Ambulatory Visit: Payer: PPO | Attending: Cardiology | Admitting: Cardiology

## 2023-05-03 ENCOUNTER — Encounter: Payer: Self-pay | Admitting: Cardiology

## 2023-05-03 VITALS — BP 196/76 | HR 71 | Ht 62.0 in | Wt 184.2 lb

## 2023-05-03 DIAGNOSIS — R079 Chest pain, unspecified: Secondary | ICD-10-CM | POA: Diagnosis not present

## 2023-05-03 DIAGNOSIS — Z8249 Family history of ischemic heart disease and other diseases of the circulatory system: Secondary | ICD-10-CM | POA: Diagnosis not present

## 2023-05-03 DIAGNOSIS — I10 Essential (primary) hypertension: Secondary | ICD-10-CM

## 2023-05-03 MED ORDER — LOSARTAN POTASSIUM-HCTZ 50-12.5 MG PO TABS: 1.0000 | ORAL_TABLET | Freq: Every day | ORAL | 3 refills | Status: DC

## 2023-05-03 NOTE — Patient Instructions (Signed)
Medication Instructions:  Please start Losartan-hydrochlorothiazide 50-12.5 mg once a day. Continue all other medications as listed.  *If you need a refill on your cardiac medications before your next appointment, please call your pharmacy*  Testing/Procedures: Your physician has requested that you have a Coronary Calcium score which is completed by CT. Cardiac computed tomography (CT) is a painless test that uses an x-ray machine to take clear, detailed pictures of your heart. There are no instructions for this testing.  You may eat/drink and take your normal medications this day.  The cost of the testing is $99 due at the time of your appointment.  Follow-Up: At [facility], you and your health needs are our priority.  As part of our continuing mission to provide you with exceptional heart care, we have created designated Provider Care Teams.  These Care Teams include your primary Cardiologist (physician) and Advanced Practice Providers (APPs -  Physician Assistants and Nurse Practitioners) who all work together to provide you with the care you need, when you need it.  We recommend signing up for the patient portal called "MyChart".  Sign up information is provided on this After Visit Summary.  MyChart is used to connect with patients for Virtual Visits (Telemedicine).  Patients are able to view lab/test results, encounter notes, upcoming appointments, etc.  Non-urgent messages can be sent to your provider as well.   To learn more about what you can do with MyChart, go to [email or web address].    Your next appointment:   Follow up will be based on the results of the above testing.

## 2023-05-03 NOTE — Progress Notes (Signed)
Cardiology Office Note:  .   Date:  05/03/2023  ID:  [name], DOB April 20, 1946, MRN [id number] PCP: Avanell Shackleton, NP-C  [facility] Providers Cardiologist:  None    History of Present Illness: .   [name] is a 77 y.o. female   Discussed the use of AI scribe software for clinical note transcription with the patient, who gave verbal consent to proceed.  History of Present Illness   [name], a 77 year old female with a strong family history of coronary artery disease, presents for an initial cardiology evaluation. She reports that her father passed away at the age of 18 due to a heart attack, and her brothers have also had heart-related issues. She denies any personal history of chest pain, shortness of breath, or fainting. She has a history of overactive bladder and possibly hypertension. She has been monitoring her blood pressure at home, which has been in the 150s range. She was previously on blood pressure medication, which she stopped after her blood pressure stabilized. She has been advised to take Lipitor for her cholesterol, but she stopped it due to its side effects.       - Denies smoking - Denies significant alcohol  - Father passed away at 74 and died of a heart attack - Third brother passed away in 2023/06/15 - Oldest brother getting ready to have a valve replacement - Youngest brother has a pacemaker - Another brother has a pacemaker   Studies Reviewed: [name]   EKG Interpretation Date/Time:  Monday May 03 2023 08:36:22 EDT Ventricular Rate:  71 PR Interval:  160 QRS Duration:  84 QT Interval:  370 QTC Calculation: 402 R Axis:   -10  Text Interpretation: Normal sinus rhythm Minimal voltage criteria for LVH, may be normal variant ( R in aVL ) Nonspecific ST and T wave abnormality When compared with ECG of 12-Sep-2017 21:59, No significant change since last tracing Confirmed by Donato Schultz ([id number]) on 05/03/2023 8:38:18 AM    LABS LDL: 123  mg/dL (46/96/2952) TSH: 0.8 mIU/L (02/17/2023) Creatinine: 0.65 mg/dL (84/13/2440) Hemoglobin: 12.3 g/dL (06/20/2535)  Risk Assessment/Calculations:           Physical Exam:   VS:  BP (!) 196/76   Pulse 71   Ht 5\' 2"  (1.575 m)   Wt 184 lb 3.2 oz (83.6 kg)   SpO2 97%   BMI 33.69 kg/m    Wt Readings from Last 3 Encounters:  05/03/23 184 lb 3.2 oz (83.6 kg)  04/25/23 178 lb (80.7 kg)  03/18/23 182 lb (82.6 kg)    GEN: Well nourished, well developed in no acute distress NECK: No JVD; No carotid bruits CARDIAC: RRR, no murmurs, rubs, gallops RESPIRATORY:  Clear to auscultation without rales, wheezing or rhonchi  ABDOMEN: Soft, non-tender, non-distended EXTREMITIES:  Mild ankle edema; No deformity   ASSESSMENT AND PLAN: .   Assessment and Plan    Strong Family History of Coronary Artery Disease No personal history of cardiac symptoms. Mildly elevated LDL cholesterol. Discussed the risk/benefit of statin therapy and the patient's previous intolerance to Lipitor with IBS flair. ASA as well if plaque present -Order coronary calcium score to assess for presence of coronary artery plaque. If present try Crestor.   Hypertension Blood pressure elevated in office and at home. Patient has a history of previous antihypertensive therapy with Losartan Hydrochlorothiazide 50/12.5mg . -Resume Losartan Hydrochlorothiazide 50/12.5mg  daily. Discuss with PCP.  -Advise patient to monitor blood pressure at home.  General  Health Maintenance -Encourage daily exercise, aiming for at least 30 minutes per day. -Follow-up after coronary calcium score results are available.            Dispo: Will follow up with study  Signed, Donato Schultz, MD

## 2023-05-05 ENCOUNTER — Other Ambulatory Visit (HOSPITAL_COMMUNITY): Payer: Self-pay | Admitting: Psychiatry

## 2023-05-07 ENCOUNTER — Other Ambulatory Visit (HOSPITAL_COMMUNITY): Payer: Self-pay | Admitting: *Deleted

## 2023-05-07 MED ORDER — BUPROPION HCL ER (XL) 300 MG PO TB24: 300.0000 mg | ORAL_TABLET | Freq: Every day | ORAL | 6 refills | Status: AC

## 2023-05-07 MED ORDER — BUPROPION HCL ER (XL) 300 MG PO TB24: 300.0000 mg | ORAL_TABLET | Freq: Every day | ORAL | 1 refills | Status: DC

## 2023-05-11 ENCOUNTER — Ambulatory Visit (HOSPITAL_COMMUNITY)
Admission: RE | Admit: 2023-05-11 | Discharge: 2023-05-11 | Disposition: A | Discharge status: Home / Self Care | Payer: PPO | Source: Ambulatory Visit | Attending: Cardiology | Admitting: Cardiology

## 2023-05-11 DIAGNOSIS — Z8249 Family history of ischemic heart disease and other diseases of the circulatory system: Secondary | ICD-10-CM | POA: Insufficient documentation

## 2023-05-11 DIAGNOSIS — I10 Essential (primary) hypertension: Secondary | ICD-10-CM | POA: Insufficient documentation

## 2023-05-11 DIAGNOSIS — R079 Chest pain, unspecified: Secondary | ICD-10-CM | POA: Insufficient documentation

## 2023-05-12 ENCOUNTER — Telehealth: Payer: Self-pay | Admitting: Gastroenterology

## 2023-05-12 NOTE — Telephone Encounter (Signed)
PT is calling to get refill on dicyclomine. She is asking for a new script to take a whole pill 3 times daily. Please advise.

## 2023-05-12 NOTE — Telephone Encounter (Signed)
Left message for patient to return my call.

## 2023-05-13 ENCOUNTER — Telehealth: Payer: Self-pay | Admitting: Cardiology

## 2023-05-13 DIAGNOSIS — Z79899 Other long term (current) drug therapy: Secondary | ICD-10-CM

## 2023-05-13 DIAGNOSIS — E7800 Pure hypercholesterolemia, unspecified: Secondary | ICD-10-CM

## 2023-05-13 MED ORDER — DICYCLOMINE HCL 20 MG PO TABS: ORAL_TABLET | ORAL | 8 refills | Status: DC

## 2023-05-13 MED ORDER — ROSUVASTATIN CALCIUM 5 MG PO TABS: 5.0000 mg | ORAL_TABLET | Freq: Every day | ORAL | 3 refills | Status: DC

## 2023-05-13 NOTE — Telephone Encounter (Signed)
Patient returned RN's call regarding test results.

## 2023-05-13 NOTE — Telephone Encounter (Signed)
Patient states she has been taking a whole tablet as needed of dicyclomine instead of a half. Patient states it is impossible to half the tablets and also she has needed a whole tablet to help with her symptoms. Informed patient that I will send in a prescription for her to take a whole tablet as needed. Prescription sent to patient's pharmacy.

## 2023-05-13 NOTE — Telephone Encounter (Signed)
Mild circumflex calcification (score of 4)  Would like to try low dose Crestor 5mg  once a day .   Check lipids in 2 months.   Spoke with pt who is aware if results.  Agreeable to start Crestor 5 mg daily and have lipid panel in 2 months.  Scheduled for 07/14/23.  Pt will call back if any questions or concerns.

## 2023-05-21 ENCOUNTER — Ambulatory Visit: Payer: PPO | Admitting: Nurse Practitioner

## 2023-07-01 ENCOUNTER — Ambulatory Visit: Payer: PPO | Admitting: Nurse Practitioner

## 2023-07-05 ENCOUNTER — Other Ambulatory Visit (HOSPITAL_COMMUNITY): Payer: Self-pay | Admitting: Psychiatry

## 2023-07-13 ENCOUNTER — Ambulatory Visit (INDEPENDENT_AMBULATORY_CARE_PROVIDER_SITE_OTHER): Payer: PPO | Admitting: *Deleted

## 2023-07-13 DIAGNOSIS — Z0000 Encounter for general adult medical examination without abnormal findings: Secondary | ICD-10-CM

## 2023-07-13 NOTE — Patient Instructions (Signed)
[name] , Thank you for taking time to come for your Medicare Wellness Visit. I appreciate your ongoing commitment to your health goals. Please review the following plan we discussed and let me know if I can assist you in the future.   Screening recommendations/referrals: Colonoscopy: no longer required Mammogram: up to date Bone Density: up to date Recommended yearly ophthalmology/optometry visit for glaucoma screening and checkup Recommended yearly dental visit for hygiene and checkup  Vaccinations: Influenza vaccine: up to date Pneumococcal vaccine: up to date Tdap vaccine: up to date Shingles vaccine:       Preventive Care 65 Years and Older, Female Preventive care refers to lifestyle choices and visits with your health care provider that can promote health and wellness. What does preventive care include? A yearly physical exam. This is also called an annual well check. Dental exams once or twice a year. Routine eye exams. Ask your health care provider how often you should have your eyes checked. Personal lifestyle choices, including: Daily care of your teeth and gums. Regular physical activity. Eating a healthy diet. Avoiding tobacco and drug use. Limiting alcohol use. Practicing safe sex. Taking low-dose aspirin every day. Taking vitamin and mineral supplements as recommended by your health care provider. What happens during an annual well check? The services and screenings done by your health care provider during your annual well check will depend on your age, overall health, lifestyle risk factors, and family history of disease. Counseling  Your health care provider may ask you questions about your: Alcohol use. Tobacco use. Drug use. Emotional well-being. Home and relationship well-being. Sexual activity. Eating habits. History of falls. Memory and ability to understand (cognition). Work and work Astronomer. Reproductive health. Screening  You may have  the following tests or measurements: Height, weight, and BMI. Blood pressure. Lipid and cholesterol levels. These may be checked every 5 years, or more frequently if you are over 52 years old. Skin check. Lung cancer screening. You may have this screening every year starting at age 74 if you have a 30-pack-year history of smoking and currently smoke or have quit within the past 15 years. Fecal occult blood test (FOBT) of the stool. You may have this test every year starting at age 36. Flexible sigmoidoscopy or colonoscopy. You may have a sigmoidoscopy every 5 years or a colonoscopy every 10 years starting at age 93. Hepatitis C blood test. Hepatitis B blood test. Sexually transmitted disease (STD) testing. Diabetes screening. This is done by checking your blood sugar (glucose) after you have not eaten for a while (fasting). You may have this done every 1-3 years. Bone density scan. This is done to screen for osteoporosis. You may have this done starting at age 61. Mammogram. This may be done every 1-2 years. Talk to your health care provider about how often you should have regular mammograms. Talk with your health care provider about your test results, treatment options, and if necessary, the need for more tests. Vaccines  Your health care provider may recommend certain vaccines, such as: Influenza vaccine. This is recommended every year. Tetanus, diphtheria, and acellular pertussis (Tdap, Td) vaccine. You may need a Td booster every 10 years. Zoster vaccine. You may need this after age 86. Pneumococcal 13-valent conjugate (PCV13) vaccine. One dose is recommended after age 59. Pneumococcal polysaccharide (PPSV23) vaccine. One dose is recommended after age 37. Talk to your health care provider about which screenings and vaccines you need and how often you need them. This information is  not intended to replace advice given to you by your health care provider. Make sure you discuss any questions  you have with your health care provider. Document Released: 09/06/2015 Document Revised: 04/29/2016 Document Reviewed: 06/11/2015 Elsevier Interactive Patient Education  2017 [employer].  Fall Prevention in the Home Falls can cause injuries. They can happen to people of all ages. There are many things you can do to make your home safe and to help prevent falls. What can I do on the outside of my home? Regularly fix the edges of walkways and driveways and fix any cracks. Remove anything that might make you trip as you walk through a door, such as a raised step or threshold. Trim any bushes or trees on the path to your home. Use bright outdoor lighting. Clear any walking paths of anything that might make someone trip, such as rocks or tools. Regularly check to see if handrails are loose or broken. Make sure that both sides of any steps have handrails. Any raised decks and porches should have guardrails on the edges. Have any leaves, snow, or ice cleared regularly. Use sand or salt on walking paths during winter. Clean up any spills in your garage right away. This includes oil or grease spills. What can I do in the bathroom? Use night lights. Install grab bars by the toilet and in the tub and shower. Do not use towel bars as grab bars. Use non-skid mats or decals in the tub or shower. If you need to sit down in the shower, use a plastic, non-slip stool. Keep the floor dry. Clean up any water that spills on the floor as soon as it happens. Remove soap buildup in the tub or shower regularly. Attach bath mats securely with double-sided non-slip rug tape. Do not have throw rugs and other things on the floor that can make you trip. What can I do in the bedroom? Use night lights. Make sure that you have a light by your bed that is easy to reach. Do not use any sheets or blankets that are too big for your bed. They should not hang down onto the floor. Have a firm chair that has side arms. You  can use this for support while you get dressed. Do not have throw rugs and other things on the floor that can make you trip. What can I do in the kitchen? Clean up any spills right away. Avoid walking on wet floors. Keep items that you use a lot in easy-to-reach places. If you need to reach something above you, use a strong step stool that has a grab bar. Keep electrical cords out of the way. Do not use floor polish or wax that makes floors slippery. If you must use wax, use non-skid floor wax. Do not have throw rugs and other things on the floor that can make you trip. What can I do with my stairs? Do not leave any items on the stairs. Make sure that there are handrails on both sides of the stairs and use them. Fix handrails that are broken or loose. Make sure that handrails are as long as the stairways. Check any carpeting to make sure that it is firmly attached to the stairs. Fix any carpet that is loose or worn. Avoid having throw rugs at the top or bottom of the stairs. If you do have throw rugs, attach them to the floor with carpet tape. Make sure that you have a light switch at the top of the  stairs and the bottom of the stairs. If you do not have them, ask someone to add them for you. What else can I do to help prevent falls? Wear shoes that: Do not have high heels. Have rubber bottoms. Are comfortable and fit you well. Are closed at the toe. Do not wear sandals. If you use a stepladder: Make sure that it is fully opened. Do not climb a closed stepladder. Make sure that both sides of the stepladder are locked into place. Ask someone to hold it for you, if possible. Clearly mark and make sure that you can see: Any grab bars or handrails. First and last steps. Where the edge of each step is. Use tools that help you move around (mobility aids) if they are needed. These include: Canes. Walkers. Scooters. Crutches. Turn on the lights when you go into a dark area. Replace any  light bulbs as soon as they burn out. Set up your furniture so you have a clear path. Avoid moving your furniture around. If any of your floors are uneven, fix them. If there are any pets around you, be aware of where they are. Review your medicines with your doctor. Some medicines can make you feel dizzy. This can increase your chance of falling. Ask your doctor what other things that you can do to help prevent falls. This information is not intended to replace advice given to you by your health care provider. Make sure you discuss any questions you have with your health care provider. Document Released: 06/06/2009 Document Revised: 01/16/2016 Document Reviewed: 09/14/2014 Elsevier Interactive Patient Education  2017 [employer].

## 2023-07-13 NOTE — Progress Notes (Signed)
Subjective:   [name] is a 77 y.o. female who presents for Medicare Annual (Subsequent) preventive examination.  Visit Complete: Virtual I connected with  [name] on 07/13/23 by a audio enabled telemedicine application and verified that I am speaking with the correct person using two identifiers.  Patient Location: Home  Provider Location: Home Office  I discussed the limitations of evaluation and management by telemedicine. The patient expressed understanding and agreed to proceed.  Vital Signs: Because this visit was a virtual/telehealth visit, some criteria may be missing or patient reported. Any vitals not documented were not able to be obtained and vitals that have been documented are patient reported.  Cardiac Risk Factors include: advanced age (>100men, >87 women);family history of premature cardiovascular disease;hypertension     Objective:    There were no vitals filed for this visit. There is no height or weight on file to calculate BMI.     07/13/2023   11:37 AM 06/25/2022    3:51 PM 01/20/2019   10:01 AM 09/12/2017   10:34 PM 05/15/2015    8:54 PM 06/25/2014    3:34 PM  Advanced Directives  Does Patient Have a Medical Advance Directive? Yes Yes Yes No No Yes  Type of Estate agent of [employer] Power of [place];Living will Healthcare Power of [place];Living will   Healthcare Power of [place];Living will  Does patient want to make changes to medical advance directive?   No - Patient declined     Copy of Healthcare Power of Attorney in Chart? Yes - validated most recent copy scanned in chart (See row information) No - copy requested      Would patient like information on creating a medical advance directive?    No - Patient declined No - patient declined information     Current Medications (verified) Outpatient Encounter Medications as of 07/13/2023  Medication Sig   acetaminophen (TYLENOL) 325 MG tablet Take 650 mg by  mouth every 6 (six) hours as needed. For pain   ALPRAZolam (XANAX) 0.25 MG tablet For anxiety 1  q day   buPROPion (WELLBUTRIN XL) 300 MG 24 hr tablet Take 1 tablet (300 mg total) by mouth daily.   Calcium Carbonate-Vitamin D 600-400 MG-UNIT per tablet Take 1 tablet by mouth daily.   colestipol (COLESTID) 1 g tablet Take 1 tablet (1 g total) by mouth 2 (two) times daily.   dicyclomine (BENTYL) 20 MG tablet TAKE 1 TABLET BY MOUTH 3 TIMES DAILY AS NEEDED FOR SPASMS.   famotidine (PEPCID) 20 MG tablet Take 1 tablet (20 mg total) by mouth at bedtime as needed. For allergies   fluticasone (FLONASE) 50 MCG/ACT nasal spray INSTILL 2 SPRAYS IN EACH NOSTRIL ONCE A DAY.   losartan-hydrochlorothiazide (HYZAAR) 50-12.5 MG tablet Take 1 tablet by mouth daily.   Multiple Vitamin (MULTIVITAMIN WITH MINERALS) TABS tablet Take 1 tablet by mouth daily.   MYRBETRIQ 50 MG TB24 tablet Take 50 mg by mouth daily.   pantoprazole (PROTONIX) 40 MG tablet TAKE 1 TABLET BY MOUTH TWICE A DAY BEFORE BREAKFAST AND DINNER   rosuvastatin (CRESTOR) 5 MG tablet Take 1 tablet (5 mg total) by mouth daily.   tolterodine (DETROL LA) 4 MG 24 hr capsule Take 4 mg by mouth at bedtime.   Vilazodone HCl (VIIBRYD) 40 MG TABS Take 1 tablet (40 mg total) by mouth daily.   Facility-Administered Encounter Medications as of 07/13/2023  Medication   0.9 %  sodium chloride infusion  Allergies (verified) Molds & smuts, Epinephrine, and Other   History: Past Medical History:  Diagnosis Date   10 year risk of MI or stroke 7.5% or greater 02/11/2022   ASCVD 10 yr risk 22.7%  Recommend starting statin    Allergic rhinitis    Allergy    Anxiety    Anxiety and depression    Arthritis    Asthma    years ago-1993-94   Barrett's esophagus 05/2005   Blood transfusion without reported diagnosis    as baby   Cancer (HCC)    Basal cell face   Cataract    bilateral removal   Depression    Diverticulosis    Esophageal stricture     Gastric polyps    GERD (gastroesophageal reflux disease)    Hiatal hernia    Hypertension    on meds   Morbid obesity (HCC)    OSA (obstructive sleep apnea)    Osteoporosis    Pneumonia    many years ago   Positive PPD    Sleep apnea    bi pap   Ulcer    Past Surgical History:  Procedure Laterality Date   CESAREAN SECTION     x 2   CHOLECYSTECTOMY     COLONOSCOPY     ENDOSCOPIC RETROGRADE CHOLANGIOPANCREATOGRAPHY (ERCP) WITH PROPOFOL N/A 09/17/2017   Procedure: ENDOSCOPIC RETROGRADE CHOLANGIOPANCREATOGRAPHY (ERCP) WITH PROPOFOL;  Surgeon: Meryl Dare, MD;  Location: [facility]COPY;  Service: Endoscopy;  Laterality: N/A;   EYE SURGERY     Cataracts   FINGER SURGERY     cyst removel from right index finger   FOOT FRACTURE SURGERY     right    FOOT NEUROMA SURGERY     left    FRACTURE SURGERY     Rt foot. Rt arm   HERNIA REPAIR     Umbilical   TONSILLECTOMY     TUBAL LIGATION  08/25/1979   UPPER GASTROINTESTINAL ENDOSCOPY     WRIST FRACTURE SURGERY     right    Family History  Problem Relation Age of Onset   Allergies Mother    Alzheimer's disease Mother    Heart failure Mother    Arthritis Mother    Hearing loss Mother    Depression Mother    Coronary artery disease Father    Heart failure Father    Early death Father    Heart disease Father    Obesity Father    Hypertension Brother        x 3   Diabetes Brother    Heart disease Brother    Obesity Brother    Sleep apnea Brother    Heart disease Brother    Stroke Brother    Heart attack Paternal Grandmother    Heart disease Paternal Grandmother    Lung cancer Paternal Grandfather    Cancer Paternal Grandfather    Sleep apnea Son    Diabetes Son    Early death Son    Colon cancer Maternal Uncle    Hypertension Brother    Rectal cancer Neg Hx    Stomach cancer Neg Hx    Esophageal cancer Neg Hx    Pancreatic cancer Neg Hx    Colon polyps Neg Hx    Social History   Socioeconomic History    Marital status: Widowed    Spouse name: Not on file   Number of children: 2   Years of education: Not on file   Highest education  level: Not on file  Occupational History   Occupation: Financial controller: OTHER    Comment: [employer] in [place]  Tobacco Use   Smoking status: Never   Smokeless tobacco: Never   Tobacco comments:    pt has never smoked but husband smoked  Vaping Use   Vaping status: Never Used  Substance and Sexual Activity   Alcohol use: Not Currently   Drug use: Never   Sexual activity: Not Currently    Birth control/protection: Post-menopausal, Surgical, Other-see comments  Other Topics Concern   Not on file  Social History Narrative   She lives alone (widowed).   She works as Systems developer.   Highest level of education:  Some college   Social Determinants of Health   Financial Resource Strain: Low Risk  (07/13/2023)   Overall Financial Resource Strain (CARDIA)    Difficulty of Paying Living Expenses: Not hard at all  Food Insecurity: No Food Insecurity (07/13/2023)   Hunger Vital Sign    Worried About Running Out of Food in the Last Year: Never true    Ran Out of Food in the Last Year: Never true  Transportation Needs: No Transportation Needs (07/13/2023)   PRAPARE - Administrator, Civil Service (Medical): No    Lack of Transportation (Non-Medical): No  Physical Activity: Inactive (07/13/2023)   Exercise Vital Sign    Days of Exercise per Week: 0 days    Minutes of Exercise per Session: 0 min  Stress: No Stress Concern Present (07/13/2023)   [employer] of Occupational Health - Occupational Stress Questionnaire    Feeling of Stress : Not at all  Social Connections: Moderately Isolated (07/13/2023)   Social Connection and Isolation Panel [NHANES]    Frequency of Communication with Friends and Family: More than three times a week    Frequency of Social Gatherings with Friends and Family: More than three times  a week    Attends Religious Services: Never    Database administrator or Organizations: Yes    Attends Banker Meetings: 1 to 4 times per year    Marital Status: Widowed    Tobacco Counseling Counseling given: Not Answered Tobacco comments: pt has never smoked but husband smoked   Clinical Intake:  Pre-visit preparation completed: Yes  Pain : No/denies pain     Diabetes: No  How often do you need to have someone help you when you read instructions, pamphlets, or other written materials from your doctor or pharmacy?: 1 - Never  Interpreter Needed?: No  Information entered by :: Remi Haggard LPN   Activities of Daily Living    07/13/2023   11:38 AM  In your present state of health, do you have any difficulty performing the following activities:  Hearing? 0  Vision? 0  Difficulty concentrating or making decisions? 0  Walking or climbing stairs? 0  Dressing or bathing? 0  Doing errands, shopping? 0  Using the Toilet? N  In the past six months, have you accidently leaked urine? N  Do you have problems with loss of bowel control? N  Managing your Medications? N  Managing your Finances? N  Housekeeping or managing your Housekeeping? N    Patient Care Team: Avanell Shackleton, NP-C as PCP - General (Family Medicine) [facility] [place], [place] as Consulting Physician (Optometry)  Indicate any recent Medical Services you may have received from other than [facility] providers in the past year (date may  be approximate).     Assessment:   This is a routine wellness examination for Jerilynn.  Hearing/Vision screen Hearing Screening - Comments:: No trouble hearing Vision Screening - Comments:: Up to date Caroleen Hamman   Goals Addressed             This Visit's Progress    Patient Stated       Travel more       Depression Screen    07/13/2023   11:40 AM 02/17/2023    8:33 AM 06/25/2022    4:09 PM 02/10/2022    9:34 AM 12/11/2021    1:42 PM  PHQ 2/9  Scores  PHQ - 2 Score 0 0 0 0 0  PHQ- 9 Score 0 0       Fall Risk    07/13/2023   11:37 AM 02/17/2023    8:33 AM 06/25/2022    3:52 PM 02/10/2022    9:34 AM 12/11/2021    1:42 PM  Fall Risk   Falls in the past year? 0 0 0 0 0  Number falls in past yr: 0 0 0 0 0  Injury with Fall? 0 0 0 0 0  Risk for fall due to :  No Fall Risks No Fall Risks No Fall Risks No Fall Risks  Follow up Falls evaluation completed;Education provided;Falls prevention discussed Falls evaluation completed Falls prevention discussed Falls evaluation completed Falls evaluation completed    MEDICARE RISK AT HOME: Medicare Risk at Home Any stairs in or around the home?: No If so, are there any without handrails?: No Home free of loose throw rugs in walkways, pet beds, electrical cords, etc?: Yes Adequate lighting in your home to reduce risk of falls?: Yes Life alert?: No Use of a cane, walker or w/c?: No Grab bars in the bathroom?: No Shower chair or bench in shower?: Yes Elevated toilet seat or a handicapped toilet?: No  TIMED UP AND GO:  Was the test performed?  No    Cognitive Function:      03/24/2017    2:10 PM  Montreal Cognitive Assessment   Visuospatial/ Executive (0/5) 4  Naming (0/3) 3  Attention: Read list of digits (0/2) 2  Attention: Read list of letters (0/1) 1  Attention: Serial 7 subtraction starting at 100 (0/3) 3  Language: Repeat phrase (0/2) 2  Language : Fluency (0/1) 1  Abstraction (0/2) 2  Delayed Recall (0/5) 4  Orientation (0/6) 5  Total 27  Adjusted Score (based on education) 27      07/13/2023   11:41 AM 06/25/2022    4:17 PM  6CIT Screen  What Year? 0 points 0 points  What month? 0 points 0 points  What time? 0 points 0 points  Count back from 20 0 points 0 points  Months in reverse 0 points 0 points  Repeat phrase 0 points 0 points  Total Score 0 points 0 points    Immunizations Immunization History  Administered Date(s) Administered   Fluad Quad(high  Dose 65+) 05/13/2020, 05/20/2021, 05/19/2022   Influenza Split 05/24/2013, 05/10/2015, 05/23/2016   Influenza Whole 06/24/2009, 05/24/2010, 05/25/2011, 05/24/2012, 05/17/2017, 04/24/2018   Influenza, High Dose Seasonal PF 06/05/2019   Influenza,inj,Quad PF,6+ Mos 06/07/2014   Influenza-Unspecified 06/09/2017, 06/05/2019, 06/22/2023   PFIZER(Purple Top)SARS-COV-2 Vaccination 01/23/2020, 02/14/2020   Pneumococcal Conjugate-13 04/27/2014   Pneumococcal Polysaccharide-23 05/24/2008, 05/17/2012, 01/24/2019   Td 05/23/2009   Tdap 01/24/2019   Tetanus 08/24/2002    TDAP status: Up to  date  Flu Vaccine status: Up to date  Pneumococcal vaccine status: Up to date  Covid-19 vaccine status: Declined, Education has been provided regarding the importance of this vaccine but patient still declined. Advised may receive this vaccine at local pharmacy or Health Dept.or vaccine clinic. Aware to provide a copy of the vaccination record if obtained from local pharmacy or Health Dept. Verbalized acceptance and understanding.  Qualifies for Shingles Vaccine? No   Zostavax completed Yes   Shingrix Completed?: No.    Education has been provided regarding the importance of this vaccine. Patient has been advised to call insurance company to determine out of pocket expense if they have not yet received this vaccine. Advised may also receive vaccine at local pharmacy or Health Dept. Verbalized acceptance and understanding.  Screening Tests Health Maintenance  Topic Date Due   COVID-19 Vaccine (3 - Pfizer risk series) 07/29/2023 (Originally 03/13/2020)   Colonoscopy  07/09/2024   Medicare Annual Wellness (AWV)  07/12/2024   DTaP/Tdap/Td (3 - Td or Tdap) 01/23/2029   Pneumonia Vaccine 9+ Years old  Completed   INFLUENZA VACCINE  Completed   DEXA SCAN  Completed   Hepatitis C Screening  Completed   HPV VACCINES  Aged Out   Zoster Vaccines- Shingrix  Discontinued    Health Maintenance  There are no  preventive care reminders to display for this patient.   Colorectal cancer screening: No longer required.   Mammogram status: Completed 2024. Repeat every year    [facility]  patient reported  Bone Density  up to date  2023  Lung Cancer Screening: (Low Dose CT Chest recommended if Age 42-80 years, 20 pack-year currently smoking OR have quit w/in 15years.) does not qualify.   Lung Cancer Screening Referral:   Additional Screening:  Hepatitis C Screening: does not qualify; Completed 2019  Vision Screening: Recommended annual ophthalmology exams for early detection of glaucoma and other disorders of the eye. Is the patient up to date with their annual eye exam?  Yes  Who is the provider or what is the name of the office in which the patient attends annual eye exams? [facility] in [place] If pt is not established with a provider, would they like to be referred to a provider to establish care? No .   Dental Screening: Recommended annual dental exams for proper oral hygiene  Community Resource Referral / Chronic Care Management: CRR required this visit?  No   CCM required this visit?  No     Plan:     I have personally reviewed and noted the following in the patient's chart:   Medical and social history Use of alcohol, tobacco or illicit drugs  Current medications and supplements including opioid prescriptions. Patient is not currently taking opioid prescriptions. Functional ability and status Nutritional status Physical activity Advanced directives List of other physicians Hospitalizations, surgeries, and ER visits in previous 12 months Vitals Screenings to include cognitive, depression, and falls Referrals and appointments  In addition, I have reviewed and discussed with patient certain preventive protocols, quality metrics, and best practice recommendations. A written personalized care plan for preventive services as well as general preventive health  recommendations were provided to patient.     Remi Haggard, LPN   21/[id number]/[id number]   After Visit Summary: (MyChart) Due to this being a telephonic visit, the after visit summary with patients personalized plan was offered to patient via MyChart   Nurse Notes:

## 2023-07-14 ENCOUNTER — Ambulatory Visit: Payer: PPO | Attending: Nurse Practitioner

## 2023-07-14 DIAGNOSIS — Z79899 Other long term (current) drug therapy: Secondary | ICD-10-CM

## 2023-07-14 DIAGNOSIS — E7800 Pure hypercholesterolemia, unspecified: Secondary | ICD-10-CM

## 2023-07-15 LAB — LIPID PANEL
Chol/HDL Ratio: 3 ratio (ref 0.0–4.4)
Cholesterol, Total: 186 mg/dL (ref 100–199)
HDL: 62 mg/dL (ref >39)
LDL Chol Calc (NIH): 104 mg/dL — ABNORMAL HIGH (ref 0–99)
Triglycerides: 110 mg/dL (ref 0–149)
VLDL Cholesterol Cal: 20 mg/dL (ref 5–40)

## 2023-08-31 ENCOUNTER — Encounter: Payer: Self-pay | Admitting: Nurse Practitioner

## 2023-08-31 ENCOUNTER — Ambulatory Visit: Payer: PPO | Admitting: Nurse Practitioner

## 2023-08-31 VITALS — BP 128/57 | HR 64 | Temp 97.1°F | Ht 61.0 in | Wt 188.0 lb

## 2023-08-31 DIAGNOSIS — G4733 Obstructive sleep apnea (adult) (pediatric): Secondary | ICD-10-CM

## 2023-08-31 DIAGNOSIS — E669 Obesity, unspecified: Secondary | ICD-10-CM

## 2023-08-31 NOTE — Assessment & Plan Note (Signed)
 Severe sleep apnea confirmed by home sleep study despite 55-pound weight loss. Off BiPAP therapy for nine months. Willing to resume. Needs a new machine. Reviewed risks of untreated severe OSA. Educated on the importance of treatment to prevent these complications. Prefers non-surgical options. Has had difficulties with CPAP and central emergence in past. Plan to repeat sleep study to determine current status and appropriate therapy. Aware of safe driving practices.  - Order repeat home sleep study - Evaluate for new BiPAP machine based on sleep study results - Schedule follow-up appointment in 10-12 weeks to assess therapy adherence and effectiveness.

## 2023-08-31 NOTE — Patient Instructions (Signed)
 Given your symptoms and history, I am concerned that you still have sleep disordered breathing with sleep apnea. You will need a repeat sleep study for further evaluation. Someone will contact you to schedule this. If you do still have sleep apnea, we will resume BiPAP therapy.   We discussed how untreated sleep apnea puts an individual at risk for cardiac arrhthymias, pulm HTN, DM, stroke and increases their risk for daytime accidents. We also briefly reviewed treatment options including weight loss, side sleeping position, oral appliance, CPAP therapy or referral to ENT for possible surgical options  Use caution when driving and pull over if you become sleepy.  Follow up in 10-12 weeks with Katie Edinson Domeier,NP to go over sleep study results, or sooner, if needed

## 2023-08-31 NOTE — Progress Notes (Signed)
 @Patient  ID: [name], female    DOB: 01-May-1946, 78 y.o.   MRN: [id number]  Chief Complaint  Patient presents with   Follow-up    Referring provider: Lendia Boby CROME, NP-C  HPI: 78 year old female, never smoker followed for OSA on BiPAP. She was previously followed by Dr. Shellia and last seen in office 09/09/2021 by Dr. Darlean. She was a former primary care patient of Dr. Chari. Past medical history significant for HTN, chronic rhinitis, GERD, hx of shingles, depression, HLD, obesity.   TEST/EVENTS:  03/31/2002 PSG: AHI 64  12/01/2007 BiPAP titration >> 14/10 cmH2O, centrals with higher pressures   05/20/2021: OV with Dr. Shellia. OSA. Lost about 55 lb. Hasn't been able to use BiPAP recently due to running out of supplies.  BiPAP 12/8 cmH2O. Plan to repeat HST to reassess current status of sleep apnea then determine if she needs to get new machine and supplies.   08/31/2023: Today - follow up Discussed the use of AI scribe software for clinical note transcription with the patient, who gave verbal consent to proceed.  Patient presents today for overdue follow up. She had a home sleep study after her last visit which revealed severe sleep apnea. She was instructed to continue PAP therapy. She preferred to stay on BiPAP vs retrying CPAP because she had trouble with CPAP in the past.   The patient has been off BiPAP therapy for about nine months, but reports feeling fine during the day with no symptoms of fatigue. She is unsure if she snores as she lives alone and has no one to observe her sleep. She denies any morning headaches or issues with drowsy driving.  The patient reports that she feels she sleeps well at night, only waking up to use the bathroom. She stopped using the BiPAP machine because it was bothersome. She did not have any issues with pressure settings or mask fit. Despite feeling well during the day, the patient acknowledges the long-term risks associated with untreated severe  sleep apnea, including heart problems and increased risk for driving accidents.  The patient was considering getting a new BiPAP machine but was deterred by the cost. The patient's previous BiPAP settings were 12/8. She expresses a preference for a home sleep study over an in-lab one.       Allergies  Allergen Reactions   Molds & Smuts Other (See Comments)   Epinephrine     REACTION: tachycardia   Other     Tree And Shrub Pollen    Immunization History  Administered Date(s) Administered   Fluad Quad(high Dose 65+) 05/13/2020, 05/20/2021, 05/19/2022   Influenza Split 05/24/2013, 05/10/2015, 05/23/2016   Influenza Whole 06/24/2009, 05/24/2010, 05/25/2011, 05/24/2012, 05/17/2017, 04/24/2018   Influenza, High Dose Seasonal PF 06/05/2019   Influenza,inj,Quad PF,6+ Mos 06/07/2014   Influenza-Unspecified 06/09/2017, 06/05/2019, 06/22/2023   PFIZER(Purple Top)SARS-COV-2 Vaccination 01/23/2020, 02/14/2020   Pneumococcal Conjugate-13 04/27/2014   Pneumococcal Polysaccharide-23 05/24/2008, 05/17/2012, 01/24/2019   Td 05/23/2009   Tdap 01/24/2019   Tetanus 08/24/2002    Past Medical History:  Diagnosis Date   10 year risk of MI or stroke 7.5% or greater 02/11/2022   ASCVD 10 yr risk 22.7%  Recommend starting statin    Allergic rhinitis    Allergy    Anxiety    Anxiety and depression    Arthritis    Asthma    years ago-1993-94   Barrett's esophagus 05/2005   Blood transfusion without reported diagnosis    as baby  Cancer (HCC)    Basal cell face   Cataract    bilateral removal   Depression    Diverticulosis    Esophageal stricture    Gastric polyps    GERD (gastroesophageal reflux disease)    Hiatal hernia    Hypertension    on meds   Morbid obesity (HCC)    OSA (obstructive sleep apnea)    Osteoporosis    Pneumonia    many years ago   Positive PPD    Sleep apnea    bi pap   Ulcer     Tobacco History: Social History   Tobacco Use  Smoking Status Never   Smokeless Tobacco Never  Tobacco Comments   pt has never smoked but husband smoked   Counseling given: Not Answered Tobacco comments: pt has never smoked but husband smoked   Outpatient Medications Prior to Visit  Medication Sig Dispense Refill   acetaminophen  (TYLENOL ) 325 MG tablet Take 650 mg by mouth every 6 (six) hours as needed. For pain     ALPRAZolam  (XANAX ) 0.25 MG tablet For anxiety 1  q day 20 tablet 3   buPROPion  (WELLBUTRIN  XL) 300 MG 24 hr tablet Take 1 tablet (300 mg total) by mouth daily. 30 tablet 6   Calcium  Carbonate-Vitamin D  600-400 MG-UNIT per tablet Take 1 tablet by mouth daily.     colestipol  (COLESTID ) 1 g tablet Take 1 tablet (1 g total) by mouth 2 (two) times daily. 60 tablet 0   dicyclomine  (BENTYL ) 20 MG tablet TAKE 1 TABLET BY MOUTH 3 TIMES DAILY AS NEEDED FOR SPASMS. 90 tablet 8   famotidine  (PEPCID ) 20 MG tablet Take 1 tablet (20 mg total) by mouth at bedtime as needed. For allergies 30 tablet 11   fluticasone  (FLONASE ) 50 MCG/ACT nasal spray INSTILL 2 SPRAYS IN EACH NOSTRIL ONCE A DAY. 16 g 10   losartan -hydrochlorothiazide  (HYZAAR) 50-12.5 MG tablet Take 1 tablet by mouth daily. 90 tablet 3   Multiple Vitamin (MULTIVITAMIN WITH MINERALS) TABS tablet Take 1 tablet by mouth daily.     MYRBETRIQ 50 MG TB24 tablet Take 50 mg by mouth daily.     pantoprazole  (PROTONIX ) 40 MG tablet TAKE 1 TABLET BY MOUTH TWICE A DAY BEFORE BREAKFAST AND DINNER 60 tablet 11   rosuvastatin  (CRESTOR ) 5 MG tablet Take 1 tablet (5 mg total) by mouth daily. 90 tablet 3   tolterodine (DETROL LA) 4 MG 24 hr capsule Take 4 mg by mouth at bedtime.     Vilazodone  HCl (VIIBRYD ) 40 MG TABS Take 1 tablet (40 mg total) by mouth daily. 30 tablet 7   Facility-Administered Medications Prior to Visit  Medication Dose Route Frequency Provider Last Rate Last Admin   0.9 %  sodium chloride  infusion  500 mL Intravenous Once Aneita Gwendlyn DASEN, MD         Review of Systems:   Constitutional: No  weight loss or gain, night sweats, fevers, chills, fatigue, or lassitude. HEENT: No headaches, difficulty swallowing, tooth/dental problems, or sore throat. No sneezing, itching, ear ache, nasal congestion, or post nasal drip CV:  No chest pain, orthopnea, PND, swelling in lower extremities, anasarca, dizziness, palpitations, syncope Resp: +snoring. No shortness of breath with exertion or at rest. No excess mucus or change in color of mucus. No productive or non-productive. No hemoptysis. No wheezing.  No chest wall deformity GI:  No heartburn, indigestion GU: No nocturia  Skin: No rash, lesions, ulcerations MSK:  No joint pain or  swelling.   Neuro: No dizziness or lightheadedness.  Psych: No depression or anxiety. Mood stable.     Physical Exam:  BP (!) 128/57 (BP Location: Right Arm, Patient Position: Sitting, Cuff Size: Large)   Pulse 64   Temp (!) 97.1 F (36.2 C) (Temporal)   Ht 5' 1 (1.549 m)   Wt 188 lb (85.3 kg)   SpO2 98%   BMI 35.52 kg/m   GEN: Pleasant, interactive, well-appearing; obese; in no acute distress HEENT:  Normocephalic and atraumatic. PERRLA. Sclera white. Nasal turbinates pink, moist and patent bilaterally. No rhinorrhea present. Oropharynx pink and moist, without exudate or edema. No lesions, ulcerations, or postnasal drip. Mallampati III NECK:  Supple w/ fair ROM. No JVD present. Normal carotid impulses w/o bruits. Thyroid  symmetrical with no goiter or nodules palpated. No lymphadenopathy.   CV: RRR, no m/r/g, no peripheral edema. Pulses intact, +2 bilaterally. No cyanosis, pallor or clubbing. PULMONARY:  Unlabored, regular breathing. Clear bilaterally A&P w/o wheezes/rales/rhonchi. No accessory muscle use.  GI: BS present and normoactive. Soft, non-tender to palpation. No organomegaly or masses detected.  MSK: No erythema, warmth or tenderness. Cap refil <2 sec all extrem. No deformities or joint swelling noted.  Neuro: A/Ox3. No focal deficits noted.    Skin: Warm, no lesions or rashe Psych: Normal affect and behavior. Judgement and thought content appropriate.     Lab Results:  CBC    Component Value Date/Time   WBC 7.9 04/25/2023 1347   RBC 3.99 04/25/2023 1347   HGB 12.3 04/25/2023 1347   HCT 37.5 04/25/2023 1347   PLT 377 04/25/2023 1347   MCV 94.0 04/25/2023 1347   MCH 30.8 04/25/2023 1347   MCHC 32.8 04/25/2023 1347   RDW 13.1 04/25/2023 1347   LYMPHSABS 1.9 04/25/2023 1347   MONOABS 0.7 04/25/2023 1347   EOSABS 0.1 04/25/2023 1347   BASOSABS 0.0 04/25/2023 1347    BMET    Component Value Date/Time   NA 141 04/25/2023 1347   K 3.6 04/25/2023 1347   CL 103 04/25/2023 1347   CO2 27 04/25/2023 1347   GLUCOSE 100 (H) 04/25/2023 1347   BUN 10 04/25/2023 1347   CREATININE 0.65 04/25/2023 1347   CALCIUM  8.9 04/25/2023 1347   GFRNONAA >60 04/25/2023 1347   GFRAA  11/03/2008 2125    >60        The eGFR has been calculated using the MDRD equation. This calculation has not been validated in all clinical situations. eGFR's persistently <60 mL/min signify possible Chronic Kidney Disease.    BNP No results found for: BNP   Imaging:  No results found.  Administration History     None           No data to display          No results found for: NITRICOXIDE      Assessment & Plan:   Obstructive sleep apnea Severe sleep apnea confirmed by home sleep study despite 55-pound weight loss. Off BiPAP therapy for nine months. Willing to resume. Needs a new machine. Reviewed risks of untreated severe OSA. Educated on the importance of treatment to prevent these complications. Prefers non-surgical options. Has had difficulties with CPAP and central emergence in past. Plan to repeat sleep study to determine current status and appropriate therapy. Aware of safe driving practices.  - Order repeat home sleep study - Evaluate for new BiPAP machine based on sleep study results - Schedule follow-up  appointment in 10-12 weeks to assess therapy  adherence and effectiveness.        Obesity (BMI 35.0-39.9 without comorbidity) BMI 35. Healthy weight loss encouraged   Advised if symptoms do not improve or worsen, to please contact office for sooner follow up or seek emergency care.   I spent 32 minutes of dedicated to the care of this patient on the date of this encounter to include pre-visit review of records, face-to-face time with the patient discussing conditions above, post visit ordering of testing, clinical documentation with the electronic health record, making appropriate referrals as documented, and communicating necessary findings to members of the patients care team.  Comer LULLA Rouleau, NP 08/31/2023  Pt aware and understands NP's role.

## 2023-08-31 NOTE — Assessment & Plan Note (Signed)
 BMI 35. Healthy weight loss encouraged.

## 2023-09-14 ENCOUNTER — Other Ambulatory Visit (HOSPITAL_COMMUNITY): Payer: Self-pay | Admitting: Psychiatry

## 2023-09-19 ENCOUNTER — Ambulatory Visit: Payer: PPO | Admitting: Nurse Practitioner

## 2023-09-19 DIAGNOSIS — G4730 Sleep apnea, unspecified: Secondary | ICD-10-CM | POA: Diagnosis not present

## 2023-09-19 DIAGNOSIS — G4733 Obstructive sleep apnea (adult) (pediatric): Secondary | ICD-10-CM

## 2023-09-27 ENCOUNTER — Telehealth: Payer: Self-pay | Admitting: Family Medicine

## 2023-09-27 DIAGNOSIS — F32A Depression, unspecified: Secondary | ICD-10-CM

## 2023-09-27 NOTE — Telephone Encounter (Signed)
 New referral placed.

## 2023-09-27 NOTE — Telephone Encounter (Signed)
Copied from CRM [id number]. Topic: Referral - Request for Referral >> Sep 27, 2023  1:43 PM Leavy Cella D wrote: Did the patient discuss referral with their provider in the last year? Yes (If No - schedule appointment) (If Yes - send message)  Appointment offered? No  Type of order/referral and detailed reason for visit: Psychiatrist   Preference of office, provider, location: n/a  If referral order, have you been seen by this specialty before? Yes (If Yes, this issue or another issue? When? Where? Previous specialist took to long to refill medication   Can we respond through MyChart? Yes

## 2023-10-15 NOTE — Progress Notes (Signed)
Moderate OSA. She's been on BiPAP previously due to difficulties tolerating CPAP in the past. She does have significant improvement when compared to prior testing (57 events/hr to 26/h now) but she does need to resume therapy as moderate OSA puts burden on the heart, as we discussed previously. We can retry CPAP or order another BiPAP machine but may have to tweak the pressures on the BiPAP. Let me know what she decides. Thanks!

## 2023-10-19 NOTE — Progress Notes (Signed)
 Spoke with pt and notified of results per [facility]. Pt verbalized understanding and denied any questions. She states that she would like to proceed with ordering the BIPAP. She does not want to every try CPAP again. Routing to Katie to let her know of pt decision.

## 2023-10-20 NOTE — Progress Notes (Signed)
 Please verify with pt but it looks like her prior settings with BIPAP 12/8. No download available. If this is correct, ok to place order for new BiPAP machine 12/8 with mask of choice and heated humidity.  She has follow up 12/02/2023 to review compliance. Needs to reschedule if she hasn't had the new machine >30 days. Thanks

## 2023-10-25 DIAGNOSIS — Z961 Presence of intraocular lens: Secondary | ICD-10-CM | POA: Diagnosis not present

## 2023-10-26 NOTE — Progress Notes (Signed)
 I called and spoke with the pt. She does not know what the BIPAP settings were. She had a card in the machine, but it never had any info on it. Do you still want to order BIPAP 12/8?

## 2023-10-29 NOTE — Progress Notes (Signed)
 Yes, thank you.

## 2023-11-02 ENCOUNTER — Other Ambulatory Visit: Payer: Self-pay | Admitting: Nurse Practitioner

## 2023-11-02 ENCOUNTER — Encounter: Payer: Self-pay | Admitting: *Deleted

## 2023-11-02 DIAGNOSIS — G4733 Obstructive sleep apnea (adult) (pediatric): Secondary | ICD-10-CM

## 2023-11-02 DIAGNOSIS — B351 Tinea unguium: Secondary | ICD-10-CM | POA: Diagnosis not present

## 2023-11-02 NOTE — Progress Notes (Signed)
 BIPAP ordered and have sent the pt msg via mychart letting her know.

## 2023-11-05 ENCOUNTER — Telehealth: Payer: Self-pay | Admitting: Nurse Practitioner

## 2023-11-09 NOTE — Telephone Encounter (Signed)
 Fax confirmation received 11/09/23

## 2023-11-17 ENCOUNTER — Ambulatory Visit (HOSPITAL_COMMUNITY): Payer: PPO | Admitting: Psychiatry

## 2023-11-19 ENCOUNTER — Ambulatory Visit: Admitting: Family Medicine

## 2023-11-24 ENCOUNTER — Ambulatory Visit: Admitting: Family Medicine

## 2023-12-01 DIAGNOSIS — G4733 Obstructive sleep apnea (adult) (pediatric): Secondary | ICD-10-CM | POA: Diagnosis not present

## 2023-12-02 ENCOUNTER — Ambulatory Visit: Payer: PPO | Admitting: Nurse Practitioner

## 2023-12-03 ENCOUNTER — Ambulatory Visit: Admitting: Family Medicine

## 2023-12-03 DIAGNOSIS — H1013 Acute atopic conjunctivitis, bilateral: Secondary | ICD-10-CM | POA: Diagnosis not present

## 2023-12-16 DIAGNOSIS — M1612 Unilateral primary osteoarthritis, left hip: Secondary | ICD-10-CM | POA: Diagnosis not present

## 2023-12-22 DIAGNOSIS — M25552 Pain in left hip: Secondary | ICD-10-CM | POA: Diagnosis not present

## 2023-12-30 DIAGNOSIS — T1501XA Foreign body in cornea, right eye, initial encounter: Secondary | ICD-10-CM | POA: Diagnosis not present

## 2023-12-31 DIAGNOSIS — G4733 Obstructive sleep apnea (adult) (pediatric): Secondary | ICD-10-CM | POA: Diagnosis not present

## 2024-01-18 DIAGNOSIS — M25552 Pain in left hip: Secondary | ICD-10-CM | POA: Diagnosis not present

## 2024-01-26 ENCOUNTER — Other Ambulatory Visit (HOSPITAL_BASED_OUTPATIENT_CLINIC_OR_DEPARTMENT_OTHER): Payer: Self-pay | Admitting: Family Medicine

## 2024-01-26 DIAGNOSIS — Z1231 Encounter for screening mammogram for malignant neoplasm of breast: Secondary | ICD-10-CM

## 2024-01-31 DIAGNOSIS — G4733 Obstructive sleep apnea (adult) (pediatric): Secondary | ICD-10-CM | POA: Diagnosis not present

## 2024-02-07 ENCOUNTER — Ambulatory Visit: Admitting: Nurse Practitioner

## 2024-02-07 ENCOUNTER — Encounter: Payer: Self-pay | Admitting: Nurse Practitioner

## 2024-02-07 VITALS — BP 130/74 | HR 71 | Ht 62.0 in | Wt 188.4 lb

## 2024-02-07 DIAGNOSIS — G4733 Obstructive sleep apnea (adult) (pediatric): Secondary | ICD-10-CM | POA: Diagnosis not present

## 2024-02-07 NOTE — Assessment & Plan Note (Signed)
 Moderate OSA on BiPAP. Unable to tolerate CPAP in past. Difficulties with use due to sinus issues and mask problems. Will place order for AirFit F40 FFM. Advised her to resume using the mask that is most comfortable to her in the interim. Reviewed risks of untreated moderate sleep apnea and encouraged to resume therapy. Will reassess at follow up. Safe driving practices reviewed. Healthy weight loss encouraged.  Patient Instructions  Increase use of BiPAP every night, minimum of 4-6 hours a night.  Change equipment as directed. Wash your tubing with warm soap and water daily, hang to dry. Wash humidifier portion weekly. Use bottled, distilled water and change daily Be aware of reduced alertness and do not drive or operate heavy machinery if experiencing this or drowsiness.  Exercise encouraged, as tolerated. Healthy weight management discussed.  Avoid or decrease alcohol consumption and medications that make you more sleepy, if possible. Notify if persistent daytime sleepiness occurs even with consistent use of PAP therapy.   Go back to the full face mask since you're having issues with your sinuses I also placed an order for an AirFit F40 full face mask for you to try  We discussed how untreated sleep apnea puts an individual at risk for cardiac arrhthymias, pulm HTN, DM, stroke and increases their risk for daytime accidents.   Follow up in 5-6 weeks with Katie Lark Langenfeld,NP, or sooner, if needed

## 2024-02-07 NOTE — Patient Instructions (Addendum)
 Increase use of BiPAP every night, minimum of 4-6 hours a night.  Change equipment as directed. Wash your tubing with warm soap and water daily, hang to dry. Wash humidifier portion weekly. Use bottled, distilled water and change daily Be aware of reduced alertness and do not drive or operate heavy machinery if experiencing this or drowsiness.  Exercise encouraged, as tolerated. Healthy weight management discussed.  Avoid or decrease alcohol consumption and medications that make you more sleepy, if possible. Notify if persistent daytime sleepiness occurs even with consistent use of PAP therapy.   Go back to the full face mask since you're having issues with your sinuses I also placed an order for an AirFit F40 full face mask for you to try  We discussed how untreated sleep apnea puts an individual at risk for cardiac arrhthymias, pulm HTN, DM, stroke and increases their risk for daytime accidents.   Follow up in 5-6 weeks with Katie Bell Cai,NP, or sooner, if needed

## 2024-02-07 NOTE — Progress Notes (Signed)
 @Patient  ID: [name], female    DOB: 07/26/1946, 78 y.o.   MRN: [id number]  Chief Complaint  Patient presents with   Follow-up    Follow-up for new bipap use     Referring provider: Abram Abraham, NP-C  HPI: 78 year old female, never smoker followed for OSA on BiPAP. She was previously followed by Dr. Matilde Son and last seen in office 08/31/2023 by [facility] NP. She was a former primary care patient of Dr. Jacqui Mau. Past medical history significant for HTN, chronic rhinitis, GERD, hx of shingles, depression, HLD, obesity.   TEST/EVENTS:  03/31/2002 PSG: AHI 64  12/01/2007 BiPAP titration >> 14/10 cmH2O, centrals with higher pressures  09/19/2023 HST: AHI 26/h, SpO2 low 84%  05/20/2021: OV with Dr. Matilde Son. OSA. Lost about 55 lb. Hasn't been able to use BiPAP recently due to running out of supplies.  BiPAP 12/8 cmH2O. Plan to repeat HST to reassess current status of sleep apnea then determine if she needs to get new machine and supplies.   08/31/2023: Ov with Shelby Peltz NP Discussed the use of AI scribe software for clinical note transcription with the patient, who gave verbal consent to proceed. Patient presents today for overdue follow up. She had a home sleep study after her last visit which revealed severe sleep apnea. She was instructed to continue PAP therapy. She preferred to stay on BiPAP vs retrying CPAP because she had trouble with CPAP in the past.  The patient has been off BiPAP therapy for about nine months, but reports feeling fine during the day with no symptoms of fatigue. She is unsure if she snores as she lives alone and has no one to observe her sleep. She denies any morning headaches or issues with drowsy driving. The patient reports that she feels she sleeps well at night, only waking up to use the bathroom. She stopped using the BiPAP machine because it was bothersome. She did not have any issues with pressure settings or mask fit. Despite feeling well during the day, the patient  acknowledges the long-term risks associated with untreated severe sleep apnea, including heart problems and increased risk for driving accidents. The patient was considering getting a new BiPAP machine but was deterred by the cost. The patient's previous BiPAP settings were 12/8. She expresses a preference for a home sleep study over an in-lab one.     02/07/2024: Today - follow up Patient presents today for follow up. She had a repeat sleep study after her last visit, which revealed moderate sleep apnea. She was restarted on BiPAP therapy. She has had issues with resuming therapy. Her sinuses have bothered her this allergy season and she's had a lot of trouble with them. The DME had given her a nasal mask, and told her she couldn't wear the FFM she had because of leaks. She struggles with the nasal mask due to her sinuses and feeling like she can't breathe well with it on. She did better with the full face mask; however, the DME wouldn't provide her with a new one or the correct size. They originally gave her a large, because they didn't have any smalls in stock, and this didn't fit at all. She then got a small FFM from her brother, which is the one she finds more comfortable but they had told her not to use. She feels better with BiPAP than without. Not having any issues with daytime fatigue. No drowsy driving.  11/30/8117-1/47/8295: BiPAP auto 12/8, PS 4 22/30 days;  3 % >4 hr; average use 1 hr 56 min Pressure 95th EPAP 7.9, IPAP 12 Leaks 95th 59.5 AHI 5.5  Allergies  Allergen Reactions   Molds & Smuts Other (See Comments)   Epinephrine     REACTION: tachycardia   Other     Tree And Shrub Pollen    Immunization History  Administered Date(s) Administered   Fluad Quad(high Dose 65+) 05/13/2020, 05/20/2021, 05/19/2022   Influenza Split 05/24/2013, 05/10/2015, 05/23/2016   Influenza Whole 06/24/2009, 05/24/2010, 05/25/2011, 05/24/2012, 05/17/2017, 04/24/2018   Influenza, High Dose Seasonal PF  06/05/2019   Influenza,inj,Quad PF,6+ Mos 06/07/2014   Influenza-Unspecified 06/09/2017, 06/05/2019, 06/22/2023   PFIZER(Purple Top)SARS-COV-2 Vaccination 01/23/2020, 02/14/2020   Pneumococcal Conjugate-13 04/27/2014   Pneumococcal Polysaccharide-23 05/24/2008, 05/17/2012, 01/24/2019   Td 05/23/2009   Tdap 01/24/2019   Tetanus 08/24/2002    Past Medical History:  Diagnosis Date   10 year risk of MI or stroke 7.5% or greater 02/11/2022   ASCVD 10 yr risk 22.7%  Recommend starting statin    Allergic rhinitis    Allergy    Anxiety    Anxiety and depression    Arthritis    Asthma    years ago-1993-94   Barrett's esophagus 05/2005   Blood transfusion without reported diagnosis    as baby   Cancer (HCC)    Basal cell face   Cataract    bilateral removal   Depression    Diverticulosis    Esophageal stricture    Gastric polyps    GERD (gastroesophageal reflux disease)    Hiatal hernia    Hypertension    on meds   Morbid obesity (HCC)    OSA (obstructive sleep apnea)    Osteoporosis    Pneumonia    many years ago   Positive PPD    Sleep apnea    bi pap   Ulcer     Tobacco History: Social History   Tobacco Use  Smoking Status Never  Smokeless Tobacco Never  Tobacco Comments   pt has never smoked but husband smoked   Counseling given: Not Answered Tobacco comments: pt has never smoked but husband smoked   Outpatient Medications Prior to Visit  Medication Sig Dispense Refill   acetaminophen  (TYLENOL ) 325 MG tablet Take 650 mg by mouth every 6 (six) hours as needed. For pain     ALPRAZolam  (XANAX ) 0.25 MG tablet For anxiety 1  q day 20 tablet 3   buPROPion  (WELLBUTRIN  XL) 300 MG 24 hr tablet Take 1 tablet (300 mg total) by mouth daily. 30 tablet 6   Calcium  Carbonate-Vitamin D  600-400 MG-UNIT per tablet Take 1 tablet by mouth daily.     dicyclomine  (BENTYL ) 20 MG tablet TAKE 1 TABLET BY MOUTH 3 TIMES DAILY AS NEEDED FOR SPASMS. 90 tablet 8   famotidine  (PEPCID )  20 MG tablet Take 1 tablet (20 mg total) by mouth at bedtime as needed. For allergies 30 tablet 11   fluticasone  (FLONASE ) 50 MCG/ACT nasal spray INSTILL 2 SPRAYS IN EACH NOSTRIL ONCE A DAY. 16 g 10   losartan -hydrochlorothiazide  (HYZAAR) 50-12.5 MG tablet Take 1 tablet by mouth daily. 90 tablet 3   Multiple Vitamin (MULTIVITAMIN WITH MINERALS) TABS tablet Take 1 tablet by mouth daily.     MYRBETRIQ 50 MG TB24 tablet Take 50 mg by mouth daily.     pantoprazole  (PROTONIX ) 40 MG tablet TAKE 1 TABLET BY MOUTH TWICE A DAY BEFORE BREAKFAST AND DINNER 60 tablet 11   rosuvastatin  (CRESTOR ) 5  MG tablet Take 1 tablet (5 mg total) by mouth daily. 90 tablet 3   tolterodine (DETROL LA) 4 MG 24 hr capsule Take 4 mg by mouth at bedtime.     Vilazodone  HCl (VIIBRYD ) 40 MG TABS Take 1 tablet (40 mg total) by mouth daily. 30 tablet 7   colestipol  (COLESTID ) 1 g tablet Take 1 tablet (1 g total) by mouth 2 (two) times daily. (Patient not taking: Reported on 02/07/2024) 60 tablet 0   Facility-Administered Medications Prior to Visit  Medication Dose Route Frequency Provider Last Rate Last Admin   0.9 %  sodium chloride  infusion  500 mL Intravenous Once Asencion Blacksmith, MD         Review of Systems:   Constitutional: No weight loss or gain, night sweats, fevers, chills, fatigue, or lassitude. HEENT: No headaches, difficulty swallowing, tooth/dental problems, or sore throat. No itching, ear ache +nasal congestion, nasal drip, sneezing CV:  No chest pain, orthopnea, PND, swelling in lower extremities, anasarca, dizziness, palpitations, syncope Resp: +snoring. No shortness of breath with exertion or at rest. No excess mucus or change in color of mucus. No productive or non-productive. No hemoptysis. No wheezing.  No chest wall deformity GI:  No heartburn, indigestion GU: No nocturia  Skin: No rash, lesions, ulcerations MSK:  No joint pain or swelling.   Neuro: No dizziness or lightheadedness.  Psych: No  depression or anxiety. Mood stable.     Physical Exam:  BP 130/74 (BP Location: Left Arm, Patient Position: Sitting, Cuff Size: Normal)   Pulse 71   Ht 5' 2 (1.575 m)   Wt 188 lb 6.4 oz (85.5 kg)   SpO2 96%   BMI 34.46 kg/m   GEN: Pleasant, interactive, well-appearing; obese; in no acute distress HEENT:  Normocephalic and atraumatic. PERRLA. Sclera white. Nasal turbinates erythematous, moist and patent bilaterally. No rhinorrhea present. Oropharynx pink and moist, without exudate or edema. No lesions, ulcerations, or postnasal drip. Mallampati III NECK:  Supple w/ fair ROM. Thyroid  symmetrical with no goiter or nodules palpated. No lymphadenopathy.   CV: RRR, no m/r/g, no peripheral edema. Pulses intact, +2 bilaterally. No cyanosis, pallor or clubbing. PULMONARY:  Unlabored, regular breathing. Clear bilaterally A&P w/o wheezes/rales/rhonchi. No accessory muscle use.  GI: BS present and normoactive. Soft, non-tender to palpation. No organomegaly or masses detected.  MSK: No erythema, warmth or tenderness. Cap refil <2 sec all extrem. No deformities or joint swelling noted.  Neuro: A/Ox3. No focal deficits noted.   Skin: Warm, no lesions or rashe Psych: Normal affect and behavior. Judgement and thought content appropriate.     Lab Results:  CBC    Component Value Date/Time   WBC 7.9 04/25/2023 1347   RBC 3.99 04/25/2023 1347   HGB 12.3 04/25/2023 1347   HCT 37.5 04/25/2023 1347   PLT 377 04/25/2023 1347   MCV 94.0 04/25/2023 1347   MCH 30.8 04/25/2023 1347   MCHC 32.8 04/25/2023 1347   RDW 13.1 04/25/2023 1347   LYMPHSABS 1.9 04/25/2023 1347   MONOABS 0.7 04/25/2023 1347   EOSABS 0.1 04/25/2023 1347   BASOSABS 0.0 04/25/2023 1347    BMET    Component Value Date/Time   NA 141 04/25/2023 1347   K 3.6 04/25/2023 1347   CL 103 04/25/2023 1347   CO2 27 04/25/2023 1347   GLUCOSE 100 (H) 04/25/2023 1347   BUN 10 04/25/2023 1347   CREATININE 0.65 04/25/2023 1347    CALCIUM  8.9 04/25/2023 1347   GFRNONAA >  60 04/25/2023 1347   GFRAA  11/03/2008 2125    >60        The eGFR has been calculated using the MDRD equation. This calculation has not been validated in all clinical situations. eGFR's persistently <60 mL/min signify possible Chronic Kidney Disease.    BNP No results found for: BNP   Imaging:  No results found.  Administration History     None           No data to display          No results found for: NITRICOXIDE      Assessment & Plan:   Obstructive sleep apnea Moderate OSA on BiPAP. Unable to tolerate CPAP in past. Difficulties with use due to sinus issues and mask problems. Will place order for AirFit F40 FFM. Advised her to resume using the mask that is most comfortable to her in the interim. Reviewed risks of untreated moderate sleep apnea and encouraged to resume therapy. Will reassess at follow up. Safe driving practices reviewed. Healthy weight loss encouraged.  Patient Instructions  Increase use of BiPAP every night, minimum of 4-6 hours a night.  Change equipment as directed. Wash your tubing with warm soap and water daily, hang to dry. Wash humidifier portion weekly. Use bottled, distilled water and change daily Be aware of reduced alertness and do not drive or operate heavy machinery if experiencing this or drowsiness.  Exercise encouraged, as tolerated. Healthy weight management discussed.  Avoid or decrease alcohol consumption and medications that make you more sleepy, if possible. Notify if persistent daytime sleepiness occurs even with consistent use of PAP therapy.   Go back to the full face mask since you're having issues with your sinuses I also placed an order for an AirFit F40 full face mask for you to try  We discussed how untreated sleep apnea puts an individual at risk for cardiac arrhthymias, pulm HTN, DM, stroke and increases their risk for daytime accidents.   Follow up in 5-6 weeks  with Alston Jerry Teagan Ozawa,NP, or sooner, if needed    Advised if symptoms do not improve or worsen, to please contact office for sooner follow up or seek emergency care.   I spent 32 minutes of dedicated to the care of this patient on the date of this encounter to include pre-visit review of records, face-to-face time with the patient discussing conditions above, post visit ordering of testing, clinical documentation with the electronic health record, making appropriate referrals as documented, and communicating necessary findings to members of the patients care team.  Roetta Clarke, NP 02/07/2024  Pt aware and understands NP's role.

## 2024-02-08 DIAGNOSIS — M25552 Pain in left hip: Secondary | ICD-10-CM | POA: Diagnosis not present

## 2024-02-09 ENCOUNTER — Ambulatory Visit (HOSPITAL_BASED_OUTPATIENT_CLINIC_OR_DEPARTMENT_OTHER)
Admission: RE | Admit: 2024-02-09 | Discharge: 2024-02-09 | Disposition: A | Discharge status: Home / Self Care | Source: Ambulatory Visit | Attending: Family Medicine | Admitting: Family Medicine

## 2024-02-09 ENCOUNTER — Encounter (HOSPITAL_BASED_OUTPATIENT_CLINIC_OR_DEPARTMENT_OTHER): Payer: Self-pay | Admitting: Radiology

## 2024-02-09 DIAGNOSIS — Z1231 Encounter for screening mammogram for malignant neoplasm of breast: Secondary | ICD-10-CM

## 2024-02-16 ENCOUNTER — Encounter: Attending: Physician Assistant | Admitting: Physician Assistant

## 2024-02-16 DIAGNOSIS — I10 Essential (primary) hypertension: Secondary | ICD-10-CM | POA: Insufficient documentation

## 2024-02-16 DIAGNOSIS — F32A Depression, unspecified: Secondary | ICD-10-CM | POA: Insufficient documentation

## 2024-02-16 DIAGNOSIS — S81811A Laceration without foreign body, right lower leg, initial encounter: Secondary | ICD-10-CM | POA: Insufficient documentation

## 2024-02-16 DIAGNOSIS — W228XXA Striking against or struck by other objects, initial encounter: Secondary | ICD-10-CM | POA: Diagnosis not present

## 2024-02-18 ENCOUNTER — Ambulatory Visit: Payer: Self-pay

## 2024-02-18 DIAGNOSIS — S81811A Laceration without foreign body, right lower leg, initial encounter: Secondary | ICD-10-CM | POA: Diagnosis not present

## 2024-02-18 NOTE — Telephone Encounter (Addendum)
 FYI Only or Action Required?: FYI only for provider.  Patient was last seen in primary care on 02/17/2023 by [name] Boby CROME, NP-[name]. Called Nurse Triage reporting Leg Swelling. Symptoms began several days ago. Interventions attempted: Rest, hydration, or home remedies. Symptoms are: gradually worsening.  Triage Disposition: See Physician Within 24 Hours  Patient/caregiver understands and will follow disposition?: Yes      Copied from CRM [id number]. Topic: Clinical - Red Word Triage >> Feb 18, 2024  4:46 PM [name] wrote: Red Word that prompted transfer to Nurse Triage: legs swelling for 3 days, red, hot, right leg has a wound. Reason for Disposition  [1] MODERATE leg swelling (e.g., swelling extends up to knees) AND [2] new-onset or worsening  Answer Assessment - Initial Assessment Questions 1. ONSET: When did the swelling start? (e.g., minutes, hours, days)     X 3 days 2. LOCATION: What part of the leg is swollen?  Are both legs swollen or just one leg?     Bilateral R leg with cut/wound - is going to wound center - cut this past Sunday 3. SEVERITY: How bad is the swelling? (e.g., localized; mild, moderate, severe)   - Localized: Small area of swelling localized to one leg.   - MILD pedal edema: Swelling limited to foot and ankle, pitting edema < 1/4 inch (6 mm) deep, rest and elevation eliminate most or all swelling.   - MODERATE edema: Swelling of lower leg to knee, pitting edema > 1/4 inch (6 mm) deep, rest and elevation only partially reduce swelling.   - SEVERE edema: Swelling extends above knee, facial or hand swelling present.      Swelling from knees to ankles, pt endorses some pitting edema moderate 4. REDNESS: Does the swelling look red or infected?     Yes redness - whole leg Denies infected looking 5. PAIN: Is the swelling painful to touch? If Yes, ask: How painful is it?   (Scale 1-10; mild, moderate or severe)     denies 6. FEVER: Do you have a  fever? If Yes, ask: What is it, how was it measured, and when did it start?      denies 7. CAUSE: What do you think is causing the leg swelling?     Unknown Does endorse on abx from wound center for R leg 8. MEDICAL HISTORY: Do you have a history of blood clots (e.g., DVT), cancer, heart failure, kidney disease, or liver failure?     denies 9. RECURRENT SYMPTOM: Have you had leg swelling before? If Yes, ask: When was the last time? What happened that time?     First time 10. OTHER SYMPTOMS: Do you have any other symptoms? (e.g., chest pain, difficulty breathing)       denies 11. PREGNANCY: Is there any chance you are pregnant? When was your last menstrual period?       N/a  Protocols used: Leg Swelling and Edema-A-AH

## 2024-02-21 ENCOUNTER — Ambulatory Visit: Admitting: Family Medicine

## 2024-02-22 ENCOUNTER — Encounter: Attending: Physician Assistant | Admitting: Physician Assistant

## 2024-02-22 DIAGNOSIS — F32A Depression, unspecified: Secondary | ICD-10-CM | POA: Insufficient documentation

## 2024-02-22 DIAGNOSIS — S81811A Laceration without foreign body, right lower leg, initial encounter: Secondary | ICD-10-CM | POA: Insufficient documentation

## 2024-02-22 DIAGNOSIS — I10 Essential (primary) hypertension: Secondary | ICD-10-CM | POA: Diagnosis not present

## 2024-02-24 DIAGNOSIS — G4733 Obstructive sleep apnea (adult) (pediatric): Secondary | ICD-10-CM | POA: Diagnosis not present

## 2024-02-24 DIAGNOSIS — I10 Essential (primary) hypertension: Secondary | ICD-10-CM | POA: Diagnosis not present

## 2024-02-26 ENCOUNTER — Encounter (HOSPITAL_BASED_OUTPATIENT_CLINIC_OR_DEPARTMENT_OTHER): Payer: Self-pay

## 2024-02-26 ENCOUNTER — Ambulatory Visit (HOSPITAL_BASED_OUTPATIENT_CLINIC_OR_DEPARTMENT_OTHER)
Admission: RE | Admit: 2024-02-26 | Discharge: 2024-02-26 | Disposition: A | Discharge status: Home / Self Care | Source: Ambulatory Visit

## 2024-02-26 VITALS — BP 132/73 | HR 63 | Temp 98.3°F | Resp 20

## 2024-02-26 DIAGNOSIS — R600 Localized edema: Secondary | ICD-10-CM

## 2024-02-26 NOTE — Discharge Instructions (Signed)
 I believe the swelling is related to the infection in your leg.  Finish the antibiotics as prescribed.  Hopefully once the infection has improved the swelling should go down.  Recommend continuing to elevate the legs.  Follow-up as needed

## 2024-02-26 NOTE — ED Provider Notes (Signed)
 [facility]    CSN: [id number] Arrival date & time: 02/26/24  0951      History   Chief Complaint Chief Complaint  Patient presents with   Leg Swelling    Legs started to swell a week and half ago.The rt leg is really bad. I have a wound on my rt ankle that is being treated at the Wound center in [place].  Both legs are swollen but RT is really bad. - Entered by patient    HPI [name] is a 78 y.o. female.   Patient is a 78 year old female that presents with bilateral leg swelling x 1.5 weeks.  Was seen by wound care provider last Tuesday for increased swelling and redness to the leg. Patient currently on Levaquin for right lower leg wound, infection. Patient states swelling now reaching right knee and above.  She has been elevating the leg and wearing compression stockings.  Denies any fevers, chills.  Denies any chest pain or shortness of breath.     Past Medical History:  Diagnosis Date   10 year risk of MI or stroke 7.5% or greater 02/11/2022   ASCVD 10 yr risk 22.7%  Recommend starting statin    Allergic rhinitis    Allergy    Anxiety    Anxiety and depression    Arthritis    Asthma    years ago-1993-94   Barrett's esophagus 05/2005   Blood transfusion without reported diagnosis    as baby   Cancer (HCC)    Basal cell face   Cataract    bilateral removal   Depression    Diverticulosis    Esophageal stricture    Gastric polyps    GERD (gastroesophageal reflux disease)    Hiatal hernia    Hypertension    on meds   Morbid obesity (HCC)    OSA (obstructive sleep apnea)    Osteoporosis    Pneumonia    many years ago   Positive PPD    Sleep apnea    bi pap   Ulcer     Patient Active Problem List   Diagnosis Date Noted   Family history of heart disease in female family member before age 71 02/17/2023   Statin intolerance 02/17/2023   Encounter for general adult medical examination with abnormal findings 02/17/2023   10 year risk of MI  or stroke 7.5% or greater 02/11/2022   Obesity (BMI 35.0-39.9 without comorbidity) 02/10/2022   Neoplasm of skin 10/16/2021   Impingement syndrome of left shoulder region 01/30/2020   Impingement syndrome of right shoulder region 01/30/2020   Osteoporosis 01/01/2020   Abnormal bruising 12/13/2019   Viral illness 07/27/2019   Nonspecific syndrome suggestive of viral illness 07/17/2019   Cellulitis 06/05/2019   Referred otalgia of left ear 01/03/2019   Chronic tension headache 08/03/2018   Pain in finger of left hand 07/01/2018   Dysphonia 06/07/2018   Chronic hoarseness 05/20/2018   Biliary pain    Common bile duct stone    Elevated LFTs 09/14/2017   Epigastric pain 09/13/2017   Laceration of nail bed of finger 08/28/2017   Localized, primary osteoarthritis of shoulder region 08/28/2017   Trigger finger of left hand 08/28/2017   Memory changes 03/25/2017   Venous stasis dermatitis of both lower extremities 08/20/2016   Chest pain 07/20/2016   Leg swelling 03/29/2016   Abdominal pain 03/02/2016   Shingles 04/24/2015   Special screening for malignant neoplasms, colon 12/20/2013   Health care  maintenance 05/22/2013   Hypokalemia 04/12/2013   Depression 04/12/2013   Other malaise and fatigue 03/16/2013   Hip pain 07/29/2012   Neck pain 05/17/2012   Essential hypertension 01/15/2012   Osteopenia 05/25/2011   Tick bite of neck 04/07/2011   Vitamin D  deficiency 06/03/2010   HYPERCHOLESTEROLEMIA 08/03/2008   Diverticulosis of colon 02/01/2008   RHINITIS, CHRONIC 01/31/2008   Morbid obesity due to excess calories (HCC) complicated by hbp/ djd  11/03/2007   Obstructive sleep apnea 11/03/2007   GERD 11/03/2007   POSITIVE PPD 11/03/2007    Past Surgical History:  Procedure Laterality Date   CESAREAN SECTION     x 2   CHOLECYSTECTOMY     COLONOSCOPY     ENDOSCOPIC RETROGRADE CHOLANGIOPANCREATOGRAPHY (ERCP) WITH PROPOFOL  N/A 09/17/2017   Procedure: ENDOSCOPIC RETROGRADE  CHOLANGIOPANCREATOGRAPHY (ERCP) WITH PROPOFOL ;  Surgeon: Aneita Gwendlyn DASEN, MD;  Location: [facility] ENDOSCOPY;  Service: Endoscopy;  Laterality: N/A;   EYE SURGERY     Cataracts   FINGER SURGERY     cyst removel from right index finger   FOOT FRACTURE SURGERY     right    FOOT NEUROMA SURGERY     left    FRACTURE SURGERY     Rt foot. Rt arm   HERNIA REPAIR     Umbilical   TONSILLECTOMY     TUBAL LIGATION  08/25/1979   UPPER GASTROINTESTINAL ENDOSCOPY     WRIST FRACTURE SURGERY     right     OB History   No obstetric history on file.      Home Medications    Prior to Admission medications   Medication Sig Start Date End Date Taking? Authorizing Provider  levofloxacin (LEVAQUIN) 750 MG tablet Take 750 mg by mouth daily. 02/23/24  Yes [provider]  acetaminophen  (TYLENOL ) 325 MG tablet Take 650 mg by mouth every 6 (six) hours as needed. For pain    [provider]  ALPRAZolam  (XANAX ) 0.25 MG tablet For anxiety 1  q day 03/17/23   Tasia Lung, MD  buPROPion  (WELLBUTRIN  XL) 300 MG 24 hr tablet Take 1 tablet (300 mg total) by mouth daily. 05/07/23   Plovsky, Lung, MD  Calcium  Carbonate-Vitamin D  600-400 MG-UNIT per tablet Take 1 tablet by mouth daily.    [provider]  colestipol  (COLESTID ) 1 g tablet Take 1 tablet (1 g total) by mouth 2 (two) times daily. Patient not taking: Reported on 02/07/2024 04/13/22   Craig Alan SAUNDERS, PA-C  dicyclomine  (BENTYL ) 20 MG tablet TAKE 1 TABLET BY MOUTH 3 TIMES DAILY AS NEEDED FOR SPASMS. 05/13/23   Aneita Gwendlyn DASEN, MD  famotidine  (PEPCID ) 20 MG tablet Take 1 tablet (20 mg total) by mouth at bedtime as needed. For allergies 03/10/23   Aneita Gwendlyn DASEN, MD  fluticasone  (FLONASE ) 50 MCG/ACT nasal spray INSTILL 2 SPRAYS IN EACH NOSTRIL ONCE A DAY. 03/09/23   Henson, Vickie L, NP-C  losartan -hydrochlorothiazide  (HYZAAR) 50-12.5 MG tablet Take 1 tablet by mouth daily. 05/03/23   Jeffrie Oneil BROCKS, MD  Multiple Vitamin (MULTIVITAMIN  WITH MINERALS) TABS tablet Take 1 tablet by mouth daily.    [provider]  MYRBETRIQ 50 MG TB24 tablet Take 50 mg by mouth daily. 03/11/22   [provider]  pantoprazole  (PROTONIX ) 40 MG tablet TAKE 1 TABLET BY MOUTH TWICE A DAY BEFORE BREAKFAST AND DINNER 03/10/23   Aneita Gwendlyn DASEN, MD  rosuvastatin  (CRESTOR ) 5 MG tablet Take 1 tablet (5 mg total) by mouth  daily. 05/13/23   Jeffrie Oneil BROCKS, MD  tolterodine (DETROL LA) 4 MG 24 hr capsule Take 4 mg by mouth at bedtime.    [provider]  Vilazodone  HCl (VIIBRYD ) 40 MG TABS Take 1 tablet (40 mg total) by mouth daily. 03/17/23   Tasia Lung, MD    Family History Family History  Problem Relation Age of Onset   Allergies Mother    Alzheimer's disease Mother    Heart failure Mother    Arthritis Mother    Hearing loss Mother    Depression Mother    Coronary artery disease Father    Heart failure Father    Early death Father    Heart disease Father    Obesity Father    Hypertension Brother        x 3   Diabetes Brother    Heart disease Brother    Obesity Brother    Sleep apnea Brother    Heart disease Brother    Stroke Brother    Heart attack Paternal Grandmother    Heart disease Paternal Grandmother    Lung cancer Paternal Grandfather    Cancer Paternal Grandfather    Sleep apnea Son    Diabetes Son    Early death Son    Colon cancer Maternal Uncle    Hypertension Brother    Rectal cancer Neg Hx    Stomach cancer Neg Hx    Esophageal cancer Neg Hx    Pancreatic cancer Neg Hx    Colon polyps Neg Hx     Social History Social History   Tobacco Use   Smoking status: Never   Smokeless tobacco: Never   Tobacco comments:    pt has never smoked but husband smoked  Vaping Use   Vaping status: Never Used  Substance Use Topics   Alcohol use: Not Currently   Drug use: Never     Allergies   Molds & smuts, Epinephrine, and Other   Review of Systems Review of Systems  See HPI Physical  Exam Triage Vital Signs ED Triage Vitals  Encounter Vitals Group     BP 02/26/24 1013 132/73     Girls Systolic BP Percentile --      Girls Diastolic BP Percentile --      Boys Systolic BP Percentile --      Boys Diastolic BP Percentile --      Pulse Rate 02/26/24 1013 63     Resp 02/26/24 1013 20     Temp 02/26/24 1013 98.3 F (36.8 C)     Temp Source 02/26/24 1013 Oral     SpO2 02/26/24 1013 93 %     Weight --      Height --      Head Circumference --      Peak Flow --      Pain Score 02/26/24 1015 3     Pain Loc --      Pain Education --      Exclude from Growth Chart --    No data found.  Updated Vital Signs BP 132/73 (BP Location: Right Arm)   Pulse 63   Temp 98.3 F (36.8 C) (Oral)   Resp 20   SpO2 93%   Visual Acuity Right Eye Distance:   Left Eye Distance:   Bilateral Distance:    Right Eye Near:   Left Eye Near:    Bilateral Near:     Physical Exam Vitals and nursing note reviewed.  Constitutional:  General: She is not in acute distress.    Appearance: Normal appearance. She is not ill-appearing, toxic-appearing or diaphoretic.  Pulmonary:     Effort: Pulmonary effort is normal.  Musculoskeletal:     Right lower leg: Edema present.     Comments: 2+ pitting edema to the right lower extremity.  Wound to right lower extremity with bandage covered.  Surrounding mild cellulitis. 1+ pedal pulse  Neurological:     Mental Status: She is alert.  Psychiatric:        Mood and Affect: Mood normal.      UC Treatments / Results  Labs (all labs ordered are listed, but only abnormal results are displayed) Labs Reviewed - No data to display  EKG   Radiology No results found.  Procedures Procedures (including critical care time)  Medications Ordered in UC Medications - No data to display  Initial Impression / Assessment and Plan / UC Course  I have reviewed the triage vital signs and the nursing notes.  Pertinent labs & imaging results that  were available during my care of the patient were reviewed by me and considered in my medical decision making (see chart for details).     Lower extremity edema-believe this is due to the current infection that is being treated.  Patient has wound to right lower extremity has been treated with Levaquin. I believe once the infection clears the swelling should hopefully resolve. No concern for DVT at this time.  Recommend if the swelling does not resolve or worsens she will need to follow-up with her PCP for potential ultrasound or other imaging. Continue to elevate the leg Follow-up as needed Final Clinical Impressions(s) / UC Diagnoses   Final diagnoses:  Lower extremity edema     Discharge Instructions      I believe the swelling is related to the infection in your leg.  Finish the antibiotics as prescribed.  Hopefully once the infection has improved the swelling should go down.  Recommend continuing to elevate the legs.  Follow-up as needed    ED Prescriptions   None    PDMP not reviewed this encounter.   Adah Wilbert LABOR, FNP 02/26/24 1054

## 2024-02-26 NOTE — ED Triage Notes (Signed)
 Bilateral leg swelling x 1.5 weeks. Saw wound care provider last Tuesday and altho right leg had some swelling, it was not like today. Patient currently on Levaquin for right lower leg wound. Patient states swelling now reaching right knee and above.

## 2024-02-28 ENCOUNTER — Other Ambulatory Visit: Payer: Self-pay | Admitting: Cardiology

## 2024-02-29 ENCOUNTER — Encounter: Admitting: Physician Assistant

## 2024-02-29 DIAGNOSIS — S81811A Laceration without foreign body, right lower leg, initial encounter: Secondary | ICD-10-CM | POA: Diagnosis not present

## 2024-03-07 ENCOUNTER — Encounter: Admitting: Physician Assistant

## 2024-03-07 DIAGNOSIS — S81811A Laceration without foreign body, right lower leg, initial encounter: Secondary | ICD-10-CM | POA: Diagnosis not present

## 2024-03-08 ENCOUNTER — Ambulatory Visit: Admitting: Physician Assistant

## 2024-03-13 ENCOUNTER — Ambulatory Visit (INDEPENDENT_AMBULATORY_CARE_PROVIDER_SITE_OTHER): Admitting: Family Medicine

## 2024-03-13 ENCOUNTER — Encounter: Payer: Self-pay | Admitting: Family Medicine

## 2024-03-13 VITALS — BP 138/64 | HR 61 | Temp 98.3°F | Resp 18 | Ht 62.0 in | Wt 190.0 lb

## 2024-03-13 DIAGNOSIS — L03119 Cellulitis of unspecified part of limb: Secondary | ICD-10-CM | POA: Diagnosis not present

## 2024-03-13 MED ORDER — CEPHALEXIN 500 MG PO CAPS
500.0000 mg | ORAL_CAPSULE | Freq: Two times a day (BID) | ORAL | 0 refills | Status: AC
Start: 2024-03-13 — End: 2024-03-20

## 2024-03-13 NOTE — Progress Notes (Signed)
 Assessment & Plan:  1. Cellulitis of lower extremity, unspecified laterality (Primary) Education provided on cellulitis.  Wound cleansed with normal saline and a new dry dressing applied.  Encouraged to keep follow-up appointment at the wound center tomorrow. - cephALEXin  (KEFLEX ) 500 MG capsule; Take 1 capsule (500 mg total) by mouth 2 (two) times daily for 7 days.  Dispense: 14 capsule; Refill: 0   Follow up plan: No follow-ups on file.  Niki Rung, MSN, APRN, FNP-C  Subjective:  HPI: [name] is a 78 y.o. female presenting on 03/13/2024 for Edema (Recent cut on right lower leg - went to wound center. Now having swelling, some redness, slight heat. First noticed this change and swelling on July 8th. Last seen wound center last Tuesday, goes back tomorrow. )  Patient reports worsening swelling to lower extremities.  She is established with the wound center after sustaining a cut on her right lower leg.  States she was having some swelling last week when she went to the wound center but it is definitely worse.  She decided to come in when the swelling got above her knees as it is typically only around her ankles.  She does have a follow-up appointment at the wound center tomorrow.    ROS: Negative unless specifically indicated above in HPI.   Relevant past medical history reviewed and updated as indicated.   Allergies and medications reviewed and updated.   Current Outpatient Medications:    acetaminophen  (TYLENOL ) 325 MG tablet, Take 650 mg by mouth every 6 (six) hours as needed. For pain, Disp: , Rfl:    ALPRAZolam  (XANAX ) 0.25 MG tablet, For anxiety 1  q day, Disp: 20 tablet, Rfl: 3   buPROPion  (WELLBUTRIN  XL) 300 MG 24 hr tablet, Take 1 tablet (300 mg total) by mouth daily., Disp: 30 tablet, Rfl: 6   Calcium  Carbonate-Vitamin D  600-400 MG-UNIT per tablet, Take 1 tablet by mouth daily., Disp: , Rfl:    dicyclomine  (BENTYL ) 20 MG tablet, TAKE 1 TABLET BY MOUTH 3 TIMES  DAILY AS NEEDED FOR SPASMS., Disp: 90 tablet, Rfl: 8   famotidine  (PEPCID ) 20 MG tablet, Take 1 tablet (20 mg total) by mouth at bedtime as needed. For allergies, Disp: 30 tablet, Rfl: 11   fluticasone  (FLONASE ) 50 MCG/ACT nasal spray, INSTILL 2 SPRAYS IN EACH NOSTRIL ONCE A DAY., Disp: 16 g, Rfl: 10   losartan  (COZAAR ) 50 MG tablet, TAKE 1 TABLET BY MOUTH DAILY WITH HCTZ, Disp: 90 tablet, Rfl: 2   losartan -hydrochlorothiazide  (HYZAAR) 50-12.5 MG tablet, Take 1 tablet by mouth daily., Disp: 90 tablet, Rfl: 3   Multiple Vitamin (MULTIVITAMIN WITH MINERALS) TABS tablet, Take 1 tablet by mouth daily., Disp: , Rfl:    MYRBETRIQ 50 MG TB24 tablet, Take 50 mg by mouth daily., Disp: , Rfl:    pantoprazole  (PROTONIX ) 40 MG tablet, TAKE 1 TABLET BY MOUTH TWICE A DAY BEFORE BREAKFAST AND DINNER, Disp: 60 tablet, Rfl: 11   rosuvastatin  (CRESTOR ) 5 MG tablet, Take 1 tablet (5 mg total) by mouth daily., Disp: 90 tablet, Rfl: 3   tolterodine (DETROL LA) 4 MG 24 hr capsule, Take 4 mg by mouth at bedtime., Disp: , Rfl:    Vilazodone  HCl (VIIBRYD ) 40 MG TABS, Take 1 tablet (40 mg total) by mouth daily., Disp: 30 tablet, Rfl: 7  Allergies  Allergen Reactions   Molds & Smuts Other (See Comments)   Epinephrine     REACTION: tachycardia   Other  Tree And Shrub Pollen    Objective:   BP 138/64   Pulse 61   Temp 98.3 F (36.8 C)   Resp 18   Ht 5' 2 (1.575 m)   Wt 190 lb (86.2 kg)   SpO2 93%   BMI 34.75 kg/m    Physical Exam Vitals reviewed.  Constitutional:      General: She is not in acute distress.    Appearance: Normal appearance. She is not ill-appearing, toxic-appearing or diaphoretic.  HENT:     Head: Normocephalic and atraumatic.  Eyes:     General: No scleral icterus.       Right eye: No discharge.        Left eye: No discharge.     Conjunctiva/sclera: Conjunctivae normal.  Cardiovascular:     Rate and Rhythm: Normal rate.  Pulmonary:     Effort: Pulmonary effort is normal. No  respiratory distress.  Musculoskeletal:        General: Normal range of motion.     Cervical back: Normal range of motion.     Right lower leg: Edema present.     Left lower leg: Edema present.  Skin:    General: Skin is warm and dry.     Capillary Refill: Capillary refill takes less than 2 seconds.     Findings: Wound (lateral aspect of RLE with erythema, swelling, and warmth) present.     Comments: Scab to lateral aspect of LLE with erythema, swelling, and warmth.  Neurological:     General: No focal deficit present.     Mental Status: She is alert and oriented to person, place, and time. Mental status is at baseline.  Psychiatric:        Mood and Affect: Mood normal.        Behavior: Behavior normal.        Thought Content: Thought content normal.        Judgment: Judgment normal.

## 2024-03-14 ENCOUNTER — Encounter: Admitting: Physician Assistant

## 2024-03-14 DIAGNOSIS — S81811A Laceration without foreign body, right lower leg, initial encounter: Secondary | ICD-10-CM | POA: Diagnosis not present

## 2024-03-17 ENCOUNTER — Ambulatory Visit: Payer: Self-pay

## 2024-03-17 DIAGNOSIS — R300 Dysuria: Secondary | ICD-10-CM | POA: Diagnosis not present

## 2024-03-17 NOTE — Telephone Encounter (Signed)
 FYI Only or Action Required?: Action required by provider: update on patient condition.  Patient was last seen in primary care on 03/13/2024 by Merlynn Niki FALCON, FNP.  Called Nurse Triage reporting Dysuria.  Symptoms began several days ago.  Interventions attempted: Nothing.  Symptoms are: rapidly worsening.  Triage Disposition: Go to ED Now (Notify PCP)  Patient/caregiver understands and will follow disposition?: No, refuses disposition Reason for Disposition  [1] Unable to urinate (or only a few drops) > 4 hours AND [2] bladder feels very full (e.g., palpable bladder or strong urge to urinate)  Answer Assessment - Initial Assessment Questions Patient refused ED recommendation after rationales were provided x multiple attempts. Patient voiced concerns that this complaint was not addressed at her recent appointment.  This RN spoke to staff at [facility] office and she agreed that patient should go to the ED. Patient call was handed off to CAL.  1. SYMPTOM: What's the main symptom you're concerned about? (e.g., frequency, incontinence)     Inability to urinate more than a few drops x 2 days. Also reports lower leg edema for which she was recently treated for cellulitis. Continues to have lower leg edema, denies any shortness of breath.  2. ONSET: When did the  inability to urinate  start?     Reports decreased urination since June, but severe inability to urinate x 2 days  3. PAIN: Is there any pain? If Yes, ask: How bad is it? (Scale: 1-10; mild, moderate, severe)     Denies  Protocols used: Urinary Symptoms-A-AH Copied from CRM #[id number]. Topic: Clinical - Red Word Triage >> Mar 17, 2024  3:02 PM Suzen RAMAN wrote: Red Word that prompted transfer to Nurse Triage: inability to urinate(couple weeks), swollen legs

## 2024-03-18 ENCOUNTER — Other Ambulatory Visit: Payer: Self-pay

## 2024-03-18 ENCOUNTER — Emergency Department (HOSPITAL_COMMUNITY)

## 2024-03-18 ENCOUNTER — Emergency Department (HOSPITAL_COMMUNITY)
Admission: EM | Admit: 2024-03-18 | Discharge: 2024-03-18 | Disposition: A | Discharge status: Home / Self Care | Source: Ambulatory Visit | Attending: Emergency Medicine | Admitting: Emergency Medicine

## 2024-03-18 DIAGNOSIS — R600 Localized edema: Secondary | ICD-10-CM | POA: Insufficient documentation

## 2024-03-18 DIAGNOSIS — R339 Retention of urine, unspecified: Secondary | ICD-10-CM | POA: Diagnosis not present

## 2024-03-18 DIAGNOSIS — Z79899 Other long term (current) drug therapy: Secondary | ICD-10-CM | POA: Insufficient documentation

## 2024-03-18 DIAGNOSIS — R739 Hyperglycemia, unspecified: Secondary | ICD-10-CM | POA: Insufficient documentation

## 2024-03-18 DIAGNOSIS — M7989 Other specified soft tissue disorders: Secondary | ICD-10-CM | POA: Diagnosis not present

## 2024-03-18 DIAGNOSIS — R2240 Localized swelling, mass and lump, unspecified lower limb: Secondary | ICD-10-CM | POA: Diagnosis not present

## 2024-03-18 DIAGNOSIS — K5790 Diverticulosis of intestine, part unspecified, without perforation or abscess without bleeding: Secondary | ICD-10-CM | POA: Diagnosis not present

## 2024-03-18 DIAGNOSIS — I872 Venous insufficiency (chronic) (peripheral): Secondary | ICD-10-CM | POA: Diagnosis not present

## 2024-03-18 DIAGNOSIS — I10 Essential (primary) hypertension: Secondary | ICD-10-CM | POA: Diagnosis not present

## 2024-03-18 LAB — CBC WITH DIFFERENTIAL/PLATELET
Abs Immature Granulocytes: 0.02 K/uL (ref 0.00–0.07)
Basophils Absolute: 0 K/uL (ref 0.0–0.1)
Basophils Relative: 1 %
Eosinophils Absolute: 0.2 K/uL (ref 0.0–0.5)
Eosinophils Relative: 3 %
HCT: 42.2 % (ref 36.0–46.0)
Hemoglobin: 13.4 g/dL (ref 12.0–15.0)
Immature Granulocytes: 0 %
Lymphocytes Relative: 30 %
Lymphs Abs: 2.2 K/uL (ref 0.7–4.0)
MCH: 30.2 pg (ref 26.0–34.0)
MCHC: 31.8 g/dL (ref 30.0–36.0)
MCV: 95 fL (ref 80.0–100.0)
Monocytes Absolute: 0.6 K/uL (ref 0.1–1.0)
Monocytes Relative: 8 %
Neutro Abs: 4.3 K/uL (ref 1.7–7.7)
Neutrophils Relative %: 58 %
Platelets: 313 K/uL (ref 150–400)
RBC: 4.44 MIL/uL (ref 3.87–5.11)
RDW: 13 % (ref 11.5–15.5)
WBC: 7.4 K/uL (ref 4.0–10.5)
nRBC: 0 % (ref 0.0–0.2)

## 2024-03-18 LAB — URINALYSIS, ROUTINE W REFLEX MICROSCOPIC
Bilirubin Urine: NEGATIVE
Glucose, UA: NEGATIVE mg/dL
Hgb urine dipstick: NEGATIVE
Ketones, ur: NEGATIVE mg/dL
Leukocytes,Ua: NEGATIVE
Nitrite: NEGATIVE
Protein, ur: NEGATIVE mg/dL
Specific Gravity, Urine: 1.028 (ref 1.005–1.030)
pH: 5 (ref 5.0–8.0)

## 2024-03-18 LAB — COMPREHENSIVE METABOLIC PANEL WITH GFR
ALT: 14 U/L (ref 0–44)
AST: 26 U/L (ref 15–41)
Albumin: 4.1 g/dL (ref 3.5–5.0)
Alkaline Phosphatase: 45 U/L (ref 38–126)
Anion gap: 10 (ref 5–15)
BUN: 13 mg/dL (ref 8–23)
CO2: 27 mmol/L (ref 22–32)
Calcium: 9.6 mg/dL (ref 8.9–10.3)
Chloride: 103 mmol/L (ref 98–111)
Creatinine, Ser: 0.73 mg/dL (ref 0.44–1.00)
GFR, Estimated: >60 mL/min (ref >60)
Glucose, Bld: 131 mg/dL — ABNORMAL HIGH (ref 70–99)
Potassium: 3.6 mmol/L (ref 3.5–5.1)
Sodium: 140 mmol/L (ref 135–145)
Total Bilirubin: 0.8 mg/dL (ref 0.0–1.2)
Total Protein: 7.8 g/dL (ref 6.5–8.1)

## 2024-03-18 LAB — BRAIN NATRIURETIC PEPTIDE: B Natriuretic Peptide: 23.3 pg/mL (ref 0.0–100.0)

## 2024-03-18 NOTE — ED Triage Notes (Signed)
 Pt has c/o decreased urinary output for several weeks, also bilateral leg swelling

## 2024-03-18 NOTE — ED Notes (Signed)
 IV access attempted x1 to get labs. Labs successfully obtained, however no IV access was established.

## 2024-03-18 NOTE — Discharge Instructions (Signed)
 You are seen in the emergency department today with concerns of decreased urine output and leg swelling.  Your labs and imaging were thankfully reassuring.  Please return the emergency department for any concerns of new or worsening symptoms.  Otherwise, please follow-up with your primary care provider.

## 2024-03-18 NOTE — ED Provider Notes (Signed)
 [place] EMERGENCY DEPARTMENT AT [facility] Provider Note   CSN: [id number] Arrival date & time: 03/18/24  1343     Patient presents with: Urinary Retention and Leg Swelling   [name] is a 78 y.o. female.  Patient past history significant for morbid obesity, diverticulosis, hypertension, leg swelling, venous stasis dermatitis of both lower extremities presents the emergency department concerns of urinary tension and leg swelling.  Reports that she has had difficulty with urine output over the last 3 days and also reports concurrent issues with appetite as she has diminished her oral intake without several days.  No reported symptoms such as chest pain or shortness of breath.  States that she is currently being treated for cellulitis in her right lower leg with initial treatment including doxycycline  recently switched to Levaquin.  She reports that she feels that this is improving.  No reported vomiting or diarrhea or significant GI loss.  HPI     Prior to Admission medications   Medication Sig Start Date End Date Taking? Authorizing Provider  acetaminophen  (TYLENOL ) 325 MG tablet Take 650 mg by mouth every 6 (six) hours as needed. For pain    [provider]  ALPRAZolam  (XANAX ) 0.25 MG tablet For anxiety 1  q day 03/17/23   Tasia Lung, MD  buPROPion  (WELLBUTRIN  XL) 300 MG 24 hr tablet Take 1 tablet (300 mg total) by mouth daily. 05/07/23   Plovsky, Lung, MD  Calcium  Carbonate-Vitamin D  600-400 MG-UNIT per tablet Take 1 tablet by mouth daily.    [provider]  cephALEXin  (KEFLEX ) 500 MG capsule Take 1 capsule (500 mg total) by mouth 2 (two) times daily for 7 days. 03/13/24 03/20/24  Merlynn Niki FALCON, FNP  dicyclomine  (BENTYL ) 20 MG tablet TAKE 1 TABLET BY MOUTH 3 TIMES DAILY AS NEEDED FOR SPASMS. 05/13/23   Aneita Gwendlyn DASEN, MD  famotidine  (PEPCID ) 20 MG tablet Take 1 tablet (20 mg total) by mouth at bedtime as needed. For allergies 03/10/23   Aneita Gwendlyn DASEN, MD  fluticasone  (FLONASE ) 50 MCG/ACT nasal spray INSTILL 2 SPRAYS IN EACH NOSTRIL ONCE A DAY. 03/09/23   Henson, Vickie L, NP-C  losartan  (COZAAR ) 50 MG tablet TAKE 1 TABLET BY MOUTH DAILY WITH HCTZ 03/01/24   Jeffrie Oneil BROCKS, MD  losartan -hydrochlorothiazide  (HYZAAR) 50-12.5 MG tablet Take 1 tablet by mouth daily. 05/03/23   Jeffrie Oneil BROCKS, MD  Multiple Vitamin (MULTIVITAMIN WITH MINERALS) TABS tablet Take 1 tablet by mouth daily.    [provider]  MYRBETRIQ 50 MG TB24 tablet Take 50 mg by mouth daily. 03/11/22   [provider]  pantoprazole  (PROTONIX ) 40 MG tablet TAKE 1 TABLET BY MOUTH TWICE A DAY BEFORE BREAKFAST AND DINNER 03/10/23   Aneita Gwendlyn DASEN, MD  rosuvastatin  (CRESTOR ) 5 MG tablet Take 1 tablet (5 mg total) by mouth daily. 05/13/23   Jeffrie Oneil BROCKS, MD  tolterodine (DETROL LA) 4 MG 24 hr capsule Take 4 mg by mouth at bedtime.    [provider]  Vilazodone  HCl (VIIBRYD ) 40 MG TABS Take 1 tablet (40 mg total) by mouth daily. 03/17/23   Tasia Lung, MD    Allergies: Molds & smuts, Epinephrine, and Other    Review of Systems  Cardiovascular:  Positive for leg swelling.  All other systems reviewed and are negative.   Updated Vital Signs BP (!) 105/55 (BP Location: Right Arm)   Pulse 65   Temp 98 F (36.7 C) (Oral)   Resp  16   SpO2 95%   Physical Exam Vitals and nursing note reviewed.  Constitutional:      General: She is not in acute distress.    Appearance: She is well-developed.  HENT:     Head: Normocephalic and atraumatic.  Eyes:     Conjunctiva/sclera: Conjunctivae normal.  Cardiovascular:     Rate and Rhythm: Normal rate and regular rhythm.     Heart sounds: No murmur heard. Pulmonary:     Effort: Pulmonary effort is normal. No respiratory distress.     Breath sounds: Normal breath sounds.  Abdominal:     General: Abdomen is flat. Bowel sounds are normal. There is no distension.     Palpations: Abdomen is soft.      Tenderness: There is no abdominal tenderness. There is no right CVA tenderness, left CVA tenderness or guarding.  Musculoskeletal:        General: No swelling.     Cervical back: Neck supple.     Right lower leg: Edema present.     Left lower leg: Edema present.     Comments: 2+ pitting edema bilaterally.  Skin:    General: Skin is warm and dry.     Capillary Refill: Capillary refill takes less than 2 seconds.  Neurological:     Mental Status: She is alert.  Psychiatric:        Mood and Affect: Mood normal.     (all labs ordered are listed, but only abnormal results are displayed) Labs Reviewed  COMPREHENSIVE METABOLIC PANEL WITH GFR - Abnormal; Notable for the following components:      Result Value   Glucose, Bld 131 (*)    All other components within normal limits  CBC WITH DIFFERENTIAL/PLATELET  BRAIN NATRIURETIC PEPTIDE  URINALYSIS, ROUTINE W REFLEX MICROSCOPIC    EKG: None  Radiology: DG Chest 2 View Result Date: 03/18/2024 CLINICAL DATA:  Leg swelling EXAM: CHEST - 2 VIEW COMPARISON:  10/08/2020 FINDINGS: Lungs are clear. Heart size and mediastinal contours are within normal limits. No effusion. Vertebral endplate spurring at multiple levels in the mid and lower thoracic spine. IMPRESSION: No acute cardiopulmonary disease. Electronically Signed   By: JONETTA Faes M.D.   On: 03/18/2024 16:12     Procedures   Medications Ordered in the ED - No data to display                                  Medical Decision Making Amount and/or Complexity of Data Reviewed Labs: ordered. Radiology: ordered.   This patient presents to the ED for concern of decreased urine output, leg swelling, this involves an extensive number of treatment options, and is a complaint that carries with it a high risk of complications and morbidity.  The differential diagnosis includes AKI, dehydration, obstructive uropathy, urolithiasis, CHF   Co morbidities that complicate the patient  evaluation  Morbid obesity, diverticulosis, hypertension, venous stasis dermatitis   Lab Tests:  I Ordered, and personally interpreted labs.  The pertinent results include: CBC unremarkable, CMP mild hyperglycemia 131, BNP normal at 23.3, UA unremarkable with no signs of infection or significant proteinuria   Imaging Studies ordered:  I ordered imaging studies including chest x-ray I independently visualized and interpreted imaging which showed negative for any acute cardiopulmonary process I agree with the radiologist interpretation   Cardiac Monitoring: / EKG:  The patient was maintained on a cardiac monitor.  I  personally viewed and interpreted the cardiac monitored which showed an underlying rhythm of: Sinus rhythm   Consultations Obtained:  I requested consultation with none,  and discussed lab and imaging findings as well as pertinent plan - they recommend: N/A   Problem List / ED Course / Critical interventions / Medication management  Patient with past history significant for morbid obesity, diverticulosis, hyperlipidemia, venous stasis dermatitis presents the emergency department concerns of leg swelling and decreased urine output.  She reports that she has had decreased urinary output for the last several days.  States that at baseline she has poor oral intake and is unsure if she may be dehydrated.  Denies any feelings of chest pain or shortness of breath.  No reported history of CHF not on currently diuretics.  States that she is currently being treated for cellulitis with Levaquin.  Initially been on doxycycline  but this switched after no significant improvement on the cellulitic findings. On exam, patient has no abnormal heart or lung sounds.  2+ pitting edema bilaterally.  No focal abdominal tenderness.  Based on exam, concern for possible CHF versus cellulitic findings.  Will add on BNP.  Patient not acutely symptomatic with no tachycardia, tachypnea, hypoxia, no  evidence of respiratory distress. Patient's basic labs are unremarkable.  BMP unremarkable 23.3.  UA without obvious proteinuria.  No evidence of renal dysfunction or AKI.  Suspect likely etiology of the swelling was secondary to cellulitis/dependent edema.  Given she has improvement symptoms, do not feel that she requires diuretic therapy at this time.  Advise close follow-up with PCP.  Return precautions discussed such as concerns for new or worsening symptoms.  Discharged home in stable condition. I have reviewed the patients home medicines and have made adjustments as needed  Test / Admission - Considered:  Patient stable for outpatient follow-up.   Final diagnoses:  Leg swelling    ED Discharge Orders     None          Cecily Legrand DELENA DEVONNA 03/18/24 1852    Geraldene Hamilton, MD 03/18/24 [phone]

## 2024-03-20 ENCOUNTER — Ambulatory Visit: Admitting: Family Medicine

## 2024-03-21 ENCOUNTER — Encounter: Admitting: Physician Assistant

## 2024-03-21 DIAGNOSIS — S81811A Laceration without foreign body, right lower leg, initial encounter: Secondary | ICD-10-CM | POA: Diagnosis not present

## 2024-03-28 ENCOUNTER — Encounter: Attending: Physician Assistant | Admitting: Physician Assistant

## 2024-03-28 DIAGNOSIS — I10 Essential (primary) hypertension: Secondary | ICD-10-CM | POA: Insufficient documentation

## 2024-03-28 DIAGNOSIS — F32A Depression, unspecified: Secondary | ICD-10-CM | POA: Insufficient documentation

## 2024-03-28 DIAGNOSIS — W228XXA Striking against or struck by other objects, initial encounter: Secondary | ICD-10-CM | POA: Diagnosis not present

## 2024-03-28 DIAGNOSIS — S81811A Laceration without foreign body, right lower leg, initial encounter: Secondary | ICD-10-CM | POA: Diagnosis present

## 2024-03-30 ENCOUNTER — Ambulatory Visit (HOSPITAL_BASED_OUTPATIENT_CLINIC_OR_DEPARTMENT_OTHER): Admitting: Nurse Practitioner

## 2024-03-30 ENCOUNTER — Encounter (HOSPITAL_BASED_OUTPATIENT_CLINIC_OR_DEPARTMENT_OTHER): Payer: Self-pay | Admitting: Nurse Practitioner

## 2024-03-30 VITALS — BP 144/72 | HR 56 | Ht 62.0 in | Wt 190.7 lb

## 2024-03-30 DIAGNOSIS — G4733 Obstructive sleep apnea (adult) (pediatric): Secondary | ICD-10-CM

## 2024-03-30 NOTE — Progress Notes (Signed)
 "  @Patient  ID: [name], female    DOB: [birth year], 78 y.o.   MRN: [id number]  Chief Complaint  Patient presents with   Follow-up    OSA    Referring provider: Lendia Boby CROME, NP-C  HPI: 78 year old female, never smoker followed for OSA on BiPAP. She was previously followed by Dr. Shellia and last seen in office 02/07/2024 by [facility]. She was a former primary care patient of Dr. Chari. Past medical history significant for HTN, chronic rhinitis, GERD, hx of shingles, depression, HLD, obesity.   TEST/EVENTS:  03/31/2002 PSG: AHI 64  12/01/2007 BiPAP titration >> 14/10 cmH2O, centrals with higher pressures  09/19/2023 HST: AHI 26/h, SpO2 low 84%  05/20/2021: OV with Dr. Shellia. OSA. Lost about 55 lb. Hasn't been able to use BiPAP recently due to running out of supplies.  BiPAP 12/8 cmH2O. Plan to repeat HST to reassess current status of sleep apnea then determine if she needs to get new machine and supplies.   08/31/2023: Ov with Caryl Manas NP Discussed the use of AI scribe software for clinical note transcription with the patient, who gave verbal consent to proceed. Patient presents today for overdue follow up. She had a home sleep study after her last visit which revealed severe sleep apnea. She was instructed to continue PAP therapy. She preferred to stay on BiPAP vs retrying CPAP because she had trouble with CPAP in the past.  The patient has been off BiPAP therapy for about nine months, but reports feeling fine during the day with no symptoms of fatigue. She is unsure if she snores as she lives alone and has no one to observe her sleep. She denies any morning headaches or issues with drowsy driving. The patient reports that she feels she sleeps well at night, only waking up to use the bathroom. She stopped using the BiPAP machine because it was bothersome. She did not have any issues with pressure settings or mask fit. Despite feeling well during the day, the patient acknowledges the long-term risks  associated with untreated severe sleep apnea, including heart problems and increased risk for driving accidents. The patient was considering getting a new BiPAP machine but was deterred by the cost. The patient's previous BiPAP settings were 12/8. She expresses a preference for a home sleep study over an in-lab one.     02/07/2024: OV with Bradee Common NP for follow up. She had a repeat sleep study after her last visit, which revealed moderate sleep apnea. She was restarted on BiPAP therapy. She has had issues with resuming therapy. Her sinuses have bothered her this allergy season and she's had a lot of trouble with them. The DME had given her a nasal mask, and told her she couldn't wear the FFM she had because of leaks. She struggles with the nasal mask due to her sinuses and feeling like she can't breathe well with it on. She did better with the full face mask; however, the DME wouldn't provide her with a new one or the correct size. They originally gave her a large, because they didn't have any smalls in stock, and this didn't fit at all. She then got a small FFM from her brother, which is the one she finds more comfortable but they had told her not to use. She feels better with BiPAP than without. Not having any issues with daytime fatigue. No drowsy driving. 4/82/7974-3/84/7974: BiPAP auto 12/8, PS 4 22/30 days; 3 % >4 hr; average use 1 hr  56 min Pressure 95th EPAP 7.9, IPAP 12 Leaks 95th 59.5 AHI 5.5  03/30/2024: Today - follow up Discussed the use of AI scribe software for clinical note transcription with the patient, who gave verbal consent to proceed.  History of Present Illness [name] is a 78 year old female with sleep apnea who presents with difficulties using her BiPAP machine.  She has been experiencing ongoing difficulties with her BiPAP machine, averaging only two hours and thirty minutes of use per night. Over the past month, she has had trouble sleeping, with only ten days in the  last ninety where she used the machine for more than four hours. She notes that the machine sometimes feels like it is delivering stronger pressure than usual, particularly last week or the week before.  She has a history of severe sleep apnea and has experienced significant weight loss.  Repeat sleep study showed moderate sleep apnea.  She previously could not tolerate CPAP and switched to BiPAP. She has tried a full face mask but felt like she was suffocating, so she currently uses a nasal mask. She does not notice any difference in how she feels on days when she uses the BiPAP longer.  No issues with drowsy driving or falling asleep while driving.  12/28/2023-03/26/2024: BiPAP auto 12/8; PS 4 64/90; 11% >4 hr; average use 2 hours 37 minutes Leaks 95th 63.7 AHI 11.2   Allergies  Allergen Reactions   Molds & Smuts Other (See Comments)   Epinephrine     REACTION: tachycardia   Other     Tree And Shrub Pollen    Immunization History  Administered Date(s) Administered   Fluad Quad(high Dose 65+) 05/13/2020, 05/20/2021, 05/19/2022   Influenza Split 05/24/2013, 05/10/2015, 05/23/2016   Influenza Whole 06/24/2009, 05/24/2010, 05/25/2011, 05/24/2012, 05/17/2017, 04/24/2018   Influenza, High Dose Seasonal PF 06/05/2019   Influenza,inj,Quad PF,6+ Mos 06/07/2014   Influenza-Unspecified 06/09/2017, 06/05/2019, 06/22/2023   PFIZER(Purple Top)SARS-COV-2 Vaccination 01/23/2020, 02/14/2020   Pneumococcal Conjugate-13 04/27/2014   Pneumococcal Polysaccharide-23 05/24/2008, 05/17/2012, 01/24/2019   Td 05/23/2009   Tdap 01/24/2019   Tetanus 08/24/2002    Past Medical History:  Diagnosis Date   10 year risk of MI or stroke 7.5% or greater 02/11/2022   ASCVD 10 yr risk 22.7%  Recommend starting statin    Allergic rhinitis    Allergy    Anxiety    Anxiety and depression    Arthritis    Asthma    years ago-1993-94   Barrett's esophagus 05/2005   Blood transfusion without reported diagnosis     as baby   Cancer (HCC)    Basal cell face   Cataract    bilateral removal   Clotting disorder (HCC)    Arms bruise easily   Depression    Diverticulosis    Esophageal stricture    Gastric polyps    GERD (gastroesophageal reflux disease)    Hiatal hernia    Hypertension    on meds   Morbid obesity (HCC)    OSA (obstructive sleep apnea)    Osteoporosis    Pneumonia    many years ago   Positive PPD    Sleep apnea    bi pap   Ulcer     Tobacco History: Social History   Tobacco Use  Smoking Status Never  Smokeless Tobacco Never  Tobacco Comments   pt has never smoked but husband smoked   Counseling given: Not Answered Tobacco comments: pt has never smoked but husband  smoked   Outpatient Medications Prior to Visit  Medication Sig Dispense Refill   acetaminophen  (TYLENOL ) 325 MG tablet Take 650 mg by mouth every 6 (six) hours as needed. For pain     ALPRAZolam  (XANAX ) 0.25 MG tablet For anxiety 1  q day 20 tablet 3   buPROPion  (WELLBUTRIN  XL) 300 MG 24 hr tablet Take 1 tablet (300 mg total) by mouth daily. 30 tablet 6   Calcium  Carbonate-Vitamin D  600-400 MG-UNIT per tablet Take 1 tablet by mouth daily.     dicyclomine  (BENTYL ) 20 MG tablet TAKE 1 TABLET BY MOUTH 3 TIMES DAILY AS NEEDED FOR SPASMS. 90 tablet 8   famotidine  (PEPCID ) 20 MG tablet Take 1 tablet (20 mg total) by mouth at bedtime as needed. For allergies 30 tablet 11   fluticasone  (FLONASE ) 50 MCG/ACT nasal spray INSTILL 2 SPRAYS IN EACH NOSTRIL ONCE A DAY. 16 g 10   losartan -hydrochlorothiazide  (HYZAAR) 50-12.5 MG tablet Take 1 tablet by mouth daily. 90 tablet 3   Multiple Vitamin (MULTIVITAMIN WITH MINERALS) TABS tablet Take 1 tablet by mouth daily.     MYRBETRIQ 50 MG TB24 tablet Take 50 mg by mouth daily.     pantoprazole  (PROTONIX ) 40 MG tablet TAKE 1 TABLET BY MOUTH TWICE A DAY BEFORE BREAKFAST AND DINNER 60 tablet 11   rosuvastatin  (CRESTOR ) 5 MG tablet Take 1 tablet (5 mg total) by mouth daily. 90  tablet 3   tolterodine (DETROL LA) 4 MG 24 hr capsule Take 4 mg by mouth at bedtime.     Vilazodone  HCl (VIIBRYD ) 40 MG TABS Take 1 tablet (40 mg total) by mouth daily. 30 tablet 7   losartan  (COZAAR ) 50 MG tablet TAKE 1 TABLET BY MOUTH DAILY WITH HCTZ (Patient not taking: Reported on 03/30/2024) 90 tablet 2   No facility-administered medications prior to visit.     Review of Systems:   Constitutional: No weight loss or gain, night sweats, fevers, chills, fatigue, or lassitude. HEENT: No headaches +nasal congestion, nasal drip, sneezing CV:  No chest pain, orthopnea, PND, palpitations Resp: +snoring. GI:  No heartburn, indigestion GU: No nocturia  Psych: No depression or anxiety. Mood stable. +sleep disturbance     Physical Exam:  BP (!) 144/72   Pulse (!) 56   Ht 5' 2 (1.575 m)   Wt 190 lb 11.2 oz (86.5 kg)   SpO2 97%   BMI 34.88 kg/m   GEN: Pleasant, interactive, well-appearing; obese; in no acute distress HEENT:  Normocephalic and atraumatic. PERRLA. Sclera white. Nasal turbinates erythematous, moist and patent bilaterally. No rhinorrhea present. Oropharynx pink and moist, without exudate or edema. No lesions, ulcerations, or postnasal drip. Mallampati III NECK:  Supple w/ fair ROM. TNo lymphadenopathy.   CV: RRR, no m/r/g, no peripheral edema.  PULMONARY:  Unlabored, regular breathing. Clear bilaterally A&P w/o wheezes/rales/rhonchi.  GI: BS present and normoactive. Soft, non-tender to palpation. No organomegaly or masses detected.  MSK: No erythema, warmth or tenderness. Cap refil <2 sec all extrem. Neuro: A/Ox3. No focal deficits noted.   Skin: Warm, no lesions or rashe Psych: Normal affect and behavior. Judgement and thought content appropriate.     Lab Results:  CBC    Component Value Date/Time   WBC 7.4 03/18/2024 1441   RBC 4.44 03/18/2024 1441   HGB 13.4 03/18/2024 1441   HCT 42.2 03/18/2024 1441   PLT 313 03/18/2024 1441   MCV 95.0 03/18/2024 1441    MCH 30.2 03/18/2024 1441   MCHC  31.8 03/18/2024 1441   RDW 13.0 03/18/2024 1441   LYMPHSABS 2.2 03/18/2024 1441   MONOABS 0.6 03/18/2024 1441   EOSABS 0.2 03/18/2024 1441   BASOSABS 0.0 03/18/2024 1441    BMET    Component Value Date/Time   NA 140 03/18/2024 1441   K 3.6 03/18/2024 1441   CL 103 03/18/2024 1441   CO2 27 03/18/2024 1441   GLUCOSE 131 (H) 03/18/2024 1441   BUN 13 03/18/2024 1441   CREATININE 0.73 03/18/2024 1441   CALCIUM  9.6 03/18/2024 1441   GFRNONAA >60 03/18/2024 1441   GFRAA  11/03/2008 2125    >60        The eGFR has been calculated using the MDRD equation. This calculation has not been validated in all clinical situations. eGFR's persistently <60 mL/min signify possible Chronic Kidney Disease.    BNP    Component Value Date/Time   BNP 23.3 03/18/2024 1441     Imaging:  DG Chest 2 View Result Date: 03/18/2024 CLINICAL DATA:  Leg swelling EXAM: CHEST - 2 VIEW COMPARISON:  10/08/2020 FINDINGS: Lungs are clear. Heart size and mediastinal contours are within normal limits. No effusion. Vertebral endplate spurring at multiple levels in the mid and lower thoracic spine. IMPRESSION: No acute cardiopulmonary disease. Electronically Signed   By: JONETTA Faes M.D.   On: 03/18/2024 16:12    Administration History     None           No data to display          No results found for: NITRICOXIDE      Assessment & Plan:   Obstructive sleep apnea Moderate OSA on BiPAP. Unable to tolerate CPAP in past. Continues to have tolerance difficulties with BiPAP and residual breakthrough events, possibly due to leaks. Adjusted BiPAP to IPAP max 10, EPAP min 6, PS 2. Will order titration study for further evaluation. With weight loss and reduced severity, she may be able to return to CPAP therapy. Has tried multiple masks. Again reviewed risks of untreated moderate sleep apnea and encouraged to resume therapy. Will reassess at follow up. Safe driving  practices reviewed. Healthy weight loss encouraged.  Patient Instructions  Increase use of BiPAP every night, minimum of 4-6 hours a night.  Change equipment as directed. Wash your tubing with warm soap and water daily, hang to dry. Wash humidifier portion weekly. Use bottled, distilled water and change daily Be aware of reduced alertness and do not drive or operate heavy machinery if experiencing this or drowsiness.  Exercise encouraged, as tolerated. Healthy weight management discussed.  Avoid or decrease alcohol consumption and medications that make you more sleepy, if possible. Notify if persistent daytime sleepiness occurs even with consistent use of PAP therapy.   BiPAP titration study ordered for further evaluation    We discussed how untreated sleep apnea puts an individual at risk for cardiac arrhthymias, pulm HTN, DM, stroke and increases their risk for daytime accidents.    Follow up in 10-12 weeks with Dr. Neda (new pt slot) or Katie Le Faulcon,NP. If symptoms do not improve or worsen, please contact office for sooner follow up     Advised if symptoms do not improve or worsen, to please contact office for sooner follow up or seek emergency care.   I spent 32 minutes of dedicated to the care of this patient on the date of this encounter to include pre-visit review of records, face-to-face time with the patient discussing conditions above, post visit ordering  of testing, clinical documentation with the electronic health record, making appropriate referrals as documented, and communicating necessary findings to members of the patients care team.  Comer LULLA Rouleau, NP 03/31/2024  Pt aware and understands NP's role.   "

## 2024-03-30 NOTE — Assessment & Plan Note (Signed)
 Moderate OSA on BiPAP. Unable to tolerate CPAP in past. Continues to have tolerance difficulties with BiPAP and residual breakthrough events, possibly due to leaks. Adjusted BiPAP to IPAP max 10, EPAP min 6, PS 2. Will order titration study for further evaluation. With weight loss and reduced severity, she may be able to return to CPAP therapy. Has tried multiple masks. Again reviewed risks of untreated moderate sleep apnea and encouraged to resume therapy. Will reassess at follow up. Safe driving practices reviewed. Healthy weight loss encouraged.  Patient Instructions  Increase use of BiPAP every night, minimum of 4-6 hours a night.  Change equipment as directed. Wash your tubing with warm soap and water daily, hang to dry. Wash humidifier portion weekly. Use bottled, distilled water and change daily Be aware of reduced alertness and do not drive or operate heavy machinery if experiencing this or drowsiness.  Exercise encouraged, as tolerated. Healthy weight management discussed.  Avoid or decrease alcohol consumption and medications that make you more sleepy, if possible. Notify if persistent daytime sleepiness occurs even with consistent use of PAP therapy.   BiPAP titration study ordered for further evaluation    We discussed how untreated sleep apnea puts an individual at risk for cardiac arrhthymias, pulm HTN, DM, stroke and increases their risk for daytime accidents.    Follow up in 10-12 weeks with Dr. Neda (new pt slot) or Katie Peregrine Nolt,NP. If symptoms do not improve or worsen, please contact office for sooner follow up

## 2024-03-30 NOTE — Patient Instructions (Signed)
 Increase use of BiPAP every night, minimum of 4-6 hours a night.  Change equipment as directed. Wash your tubing with warm soap and water daily, hang to dry. Wash humidifier portion weekly. Use bottled, distilled water and change daily Be aware of reduced alertness and do not drive or operate heavy machinery if experiencing this or drowsiness.  Exercise encouraged, as tolerated. Healthy weight management discussed.  Avoid or decrease alcohol consumption and medications that make you more sleepy, if possible. Notify if persistent daytime sleepiness occurs even with consistent use of PAP therapy.   BiPAP titration study ordered for further evaluation    We discussed how untreated sleep apnea puts an individual at risk for cardiac arrhthymias, pulm HTN, DM, stroke and increases their risk for daytime accidents.    Follow up in 10-12 weeks with Dr. Neda (new pt slot) or Katie Ronald Vinsant,NP. If symptoms do not improve or worsen, please contact office for sooner follow up

## 2024-03-31 ENCOUNTER — Encounter (HOSPITAL_BASED_OUTPATIENT_CLINIC_OR_DEPARTMENT_OTHER): Payer: Self-pay | Admitting: Nurse Practitioner

## 2024-04-04 ENCOUNTER — Encounter: Admitting: Physician Assistant

## 2024-04-04 DIAGNOSIS — S81811A Laceration without foreign body, right lower leg, initial encounter: Secondary | ICD-10-CM | POA: Diagnosis not present

## 2024-04-05 DIAGNOSIS — S81811A Laceration without foreign body, right lower leg, initial encounter: Secondary | ICD-10-CM | POA: Diagnosis not present

## 2024-04-11 ENCOUNTER — Ambulatory Visit: Admitting: Gastroenterology

## 2024-04-11 ENCOUNTER — Encounter: Admitting: Physician Assistant

## 2024-04-11 DIAGNOSIS — S81811A Laceration without foreign body, right lower leg, initial encounter: Secondary | ICD-10-CM | POA: Diagnosis not present

## 2024-04-12 DIAGNOSIS — R32 Unspecified urinary incontinence: Secondary | ICD-10-CM | POA: Diagnosis not present

## 2024-04-12 DIAGNOSIS — R350 Frequency of micturition: Secondary | ICD-10-CM | POA: Diagnosis not present

## 2024-04-18 ENCOUNTER — Encounter: Admitting: Internal Medicine

## 2024-04-18 DIAGNOSIS — S81811A Laceration without foreign body, right lower leg, initial encounter: Secondary | ICD-10-CM | POA: Diagnosis not present

## 2024-04-22 DIAGNOSIS — S7002XA Contusion of left hip, initial encounter: Secondary | ICD-10-CM | POA: Diagnosis not present

## 2024-04-22 DIAGNOSIS — S3993XA Unspecified injury of pelvis, initial encounter: Secondary | ICD-10-CM | POA: Diagnosis not present

## 2024-04-22 DIAGNOSIS — K219 Gastro-esophageal reflux disease without esophagitis: Secondary | ICD-10-CM | POA: Diagnosis not present

## 2024-04-22 DIAGNOSIS — M549 Dorsalgia, unspecified: Secondary | ICD-10-CM | POA: Diagnosis not present

## 2024-04-22 DIAGNOSIS — W19XXXA Unspecified fall, initial encounter: Secondary | ICD-10-CM | POA: Diagnosis not present

## 2024-04-22 DIAGNOSIS — M47814 Spondylosis without myelopathy or radiculopathy, thoracic region: Secondary | ICD-10-CM | POA: Diagnosis not present

## 2024-04-22 DIAGNOSIS — I10 Essential (primary) hypertension: Secondary | ICD-10-CM | POA: Diagnosis not present

## 2024-04-22 DIAGNOSIS — Y92481 Parking lot as the place of occurrence of the external cause: Secondary | ICD-10-CM | POA: Diagnosis not present

## 2024-04-22 DIAGNOSIS — M546 Pain in thoracic spine: Secondary | ICD-10-CM | POA: Diagnosis not present

## 2024-04-25 ENCOUNTER — Encounter: Attending: Physician Assistant | Admitting: Physician Assistant

## 2024-04-25 DIAGNOSIS — I10 Essential (primary) hypertension: Secondary | ICD-10-CM | POA: Diagnosis not present

## 2024-04-25 DIAGNOSIS — F32A Depression, unspecified: Secondary | ICD-10-CM | POA: Insufficient documentation

## 2024-04-25 DIAGNOSIS — X58XXXA Exposure to other specified factors, initial encounter: Secondary | ICD-10-CM | POA: Insufficient documentation

## 2024-04-25 DIAGNOSIS — S81811A Laceration without foreign body, right lower leg, initial encounter: Secondary | ICD-10-CM | POA: Insufficient documentation

## 2024-04-26 ENCOUNTER — Ambulatory Visit: Admitting: Gastroenterology

## 2024-04-26 ENCOUNTER — Encounter: Payer: Self-pay | Admitting: Gastroenterology

## 2024-04-26 VITALS — BP 120/66 | HR 60 | Ht 61.0 in | Wt 187.0 lb

## 2024-04-26 DIAGNOSIS — K580 Irritable bowel syndrome with diarrhea: Secondary | ICD-10-CM

## 2024-04-26 DIAGNOSIS — K219 Gastro-esophageal reflux disease without esophagitis: Secondary | ICD-10-CM

## 2024-04-26 DIAGNOSIS — Z1211 Encounter for screening for malignant neoplasm of colon: Secondary | ICD-10-CM

## 2024-04-26 MED ORDER — PANTOPRAZOLE SODIUM 40 MG PO TBEC: 40.0000 mg | DELAYED_RELEASE_TABLET | Freq: Every day | ORAL | 2 refills | Status: DC

## 2024-04-26 MED ORDER — FAMOTIDINE 20 MG PO TABS
20.0000 mg | ORAL_TABLET | Freq: Every evening | ORAL | 11 refills | Status: AC | PRN
Start: 2024-04-26 — End: ?

## 2024-04-26 MED ORDER — DICYCLOMINE HCL 20 MG PO TABS
ORAL_TABLET | ORAL | 8 refills | Status: AC
Start: 2024-04-26 — End: ?

## 2024-04-26 MED ORDER — NA SULFATE-K SULFATE-MG SULF 17.5-3.13-1.6 GM/177ML PO SOLN: 1.0000 | Freq: Once | ORAL | 0 refills | Status: AC

## 2024-04-26 NOTE — Progress Notes (Signed)
 Chief Complaint:follow-up, refills Primary GI Doctor: (previously Dr. Aneita) Dr. Suzann   HPI:  Patient is a  78  year old female patient with past medical history of  IBS- D and GERD who presents for follow-up and medication refills. Patient last seen in GI office on 03/10/23 by Dr. Aneita for follow-up.   Interval History    Patient has history of GERD that is managed with pantoprazole  40 mg bid, famotidine  20 mg at bedtime. She ran out of medication for about 6 weeks and has had some issues with indigestion in the evening.  Patient denies dysphagia. Patient denies nausea, vomiting, or weight loss.      Patient has history of IBS-D  and well controlled on dicyclomine  10 mg tid ac prn, Imodium 1-2 tid prn and avoiding dietary stressors. She reports she uses the OTC imodium very seldom. Patient denies rectal bleeding.   Not on blood thinners. No updates to medical history since last appt. No surgeries.  GI procedures: EGD 06/2021 - Normal esophagus.  - Multiple gastric polyps. Biopsied.  - Erythematous mucosa in the gastric body and antrum. Biopsied.  - Small hiatal hernia.  - Non-bleeding duodenal diverticulum.   Colonoscopy 06/2014, recall 10 years  1. Mild diverticulosis in the descending colon, sigmoid colon, ascending colon, and transverse colon  2. The examination was otherwise normal  Wt Readings from Last 3 Encounters:  04/26/24 187 lb (84.8 kg)  03/30/24 190 lb 11.2 oz (86.5 kg)  03/13/24 190 lb (86.2 kg)      Past Medical History:  Diagnosis Date   10 year risk of MI or stroke 7.5% or greater 02/11/2022   ASCVD 10 yr risk 22.7%  Recommend starting statin    Allergic rhinitis    Allergy    Anxiety    Anxiety and depression    Arthritis    Asthma    years ago-1993-94   Barrett's esophagus 05/2005   Blood transfusion without reported diagnosis    as baby   Cancer (HCC)    Basal cell face   Cataract    bilateral removal   Clotting disorder (HCC)    Arms  bruise easily   Depression    Diverticulosis    Esophageal stricture    Gastric polyps    GERD (gastroesophageal reflux disease)    Hiatal hernia    Hypertension    on meds   Morbid obesity (HCC)    OSA (obstructive sleep apnea)    Osteoporosis    Pneumonia    many years ago   Positive PPD    Sleep apnea    bi pap   Ulcer     Past Surgical History:  Procedure Laterality Date   CESAREAN SECTION     x 2   CHOLECYSTECTOMY     COLONOSCOPY     ENDOSCOPIC RETROGRADE CHOLANGIOPANCREATOGRAPHY (ERCP) WITH PROPOFOL  N/A 09/17/2017   Procedure: ENDOSCOPIC RETROGRADE CHOLANGIOPANCREATOGRAPHY (ERCP) WITH PROPOFOL ;  Surgeon: Aneita Gwendlyn DASEN, MD;  Location: [facility] ENDOSCOPY;  Service: Endoscopy;  Laterality: N/A;   EYE SURGERY     Cataracts   FINGER SURGERY     cyst removel from right index finger   FOOT FRACTURE SURGERY     right    FOOT NEUROMA SURGERY     left    FRACTURE SURGERY     Rt foot. Rt arm   HERNIA REPAIR     Umbilical   TONSILLECTOMY     TUBAL LIGATION  08/25/1979  UPPER GASTROINTESTINAL ENDOSCOPY     WRIST FRACTURE SURGERY     right     Current Outpatient Medications  Medication Sig Dispense Refill   acetaminophen  (TYLENOL ) 325 MG tablet Take 650 mg by mouth every 6 (six) hours as needed. For pain     ALPRAZolam  (XANAX ) 0.25 MG tablet For anxiety 1  q day 20 tablet 3   buPROPion  (WELLBUTRIN  XL) 300 MG 24 hr tablet Take 1 tablet (300 mg total) by mouth daily. 30 tablet 6   Calcium  Carbonate-Vitamin D  600-400 MG-UNIT per tablet Take 1 tablet by mouth daily.     doxycycline  (MONODOX ) 100 MG capsule Take 100 mg by mouth 2 (two) times daily.     fluconazole (DIFLUCAN) 200 MG tablet Take by mouth.     fluticasone  (FLONASE ) 50 MCG/ACT nasal spray INSTILL 2 SPRAYS IN EACH NOSTRIL ONCE A DAY. 16 g 10   losartan  (COZAAR ) 50 MG tablet TAKE 1 TABLET BY MOUTH DAILY WITH HCTZ 90 tablet 2   losartan -hydrochlorothiazide  (HYZAAR) 50-12.5 MG tablet Take 1 tablet by mouth daily. 90  tablet 3   meloxicam  (MOBIC ) 7.5 MG tablet Take 7.5 mg by mouth daily.     methocarbamol (ROBAXIN) 500 MG tablet Take 500 mg by mouth 2 (two) times daily as needed.     Multiple Vitamin (MULTIVITAMIN WITH MINERALS) TABS tablet Take 1 tablet by mouth daily.     MYRBETRIQ 50 MG TB24 tablet Take 50 mg by mouth daily.     rosuvastatin  (CRESTOR ) 5 MG tablet Take 1 tablet (5 mg total) by mouth daily. 90 tablet 3   tolterodine (DETROL LA) 4 MG 24 hr capsule Take 4 mg by mouth at bedtime.     traMADol  (ULTRAM ) 50 MG tablet Take 50 mg by mouth every 6 (six) hours as needed.     Vilazodone  HCl (VIIBRYD ) 40 MG TABS Take 1 tablet (40 mg total) by mouth daily. 30 tablet 7   dicyclomine  (BENTYL ) 20 MG tablet TAKE 1 TABLET BY MOUTH 3 TIMES DAILY AS NEEDED FOR SPASMS. 90 tablet 8   famotidine  (PEPCID ) 20 MG tablet Take 1 tablet (20 mg total) by mouth at bedtime as needed. For allergies 30 tablet 11   pantoprazole  (PROTONIX ) 40 MG tablet Take 1 tablet (40 mg total) by mouth daily. TAKE 1 TABLET BY MOUTH TWICE A DAY BEFORE BREAKFAST AND DINNER 30 tablet 2   No current facility-administered medications for this visit.    Allergies as of 04/26/2024 - Review Complete 04/26/2024  Allergen Reaction Noted   Molds & smuts Other (See Comments) 04/24/2022   Epinephrine     Other  12/11/2021    Family History  Problem Relation Age of Onset   Allergies Mother    Alzheimer's disease Mother    Heart failure Mother    Arthritis Mother    Hearing loss Mother    Depression Mother    Coronary artery disease Father    Heart failure Father    Early death Father    Heart disease Father        Passed away with heart attack at 40   Obesity Father    Heart attack Father    Hypertension Brother        x 3   Diabetes Brother    Heart disease Brother        Has a pacemaker.  Getting valve replacement surgery this month   Obesity Brother    Sleep apnea Brother  Heart disease Brother    Stroke Brother    Heart  attack Paternal Grandmother    Heart disease Paternal Grandmother        Heart attavk   Lung cancer Paternal Grandfather    Cancer Paternal Grandfather    Sleep apnea Son    Diabetes Son    Early death Son    Colon cancer Maternal Uncle    Hypertension Brother    Heart attack Brother    Heart disease Brother        Pacemaker   Rectal cancer Neg Hx    Stomach cancer Neg Hx    Esophageal cancer Neg Hx    Pancreatic cancer Neg Hx    Colon polyps Neg Hx     Review of Systems:    Constitutional: No weight loss, fever, chills, weakness or fatigue HEENT: Eyes: No change in vision               Ears, Nose, Throat:  No change in hearing or congestion Skin: No rash or itching Cardiovascular: No chest pain, chest pressure or palpitations   Respiratory: No SOB or cough Gastrointestinal: See HPI and otherwise negative Genitourinary: No dysuria or change in urinary frequency Neurological: No headache, dizziness or syncope Musculoskeletal: No new muscle or joint pain Hematologic: No bleeding or bruising Psychiatric: No history of depression or anxiety    Physical Exam:  Vital signs: BP 120/66   Pulse 60   Ht 5' 1 (1.549 m)   Wt 187 lb (84.8 kg)   BMI 35.33 kg/m   Constitutional:   Pleasant  female appears to be in NAD, Well developed, Well nourished, alert and cooperative Throat: Oral cavity and pharynx without inflammation, swelling or lesion.  Respiratory: Respirations even and unlabored. Lungs clear to auscultation bilaterally.   No wheezes, crackles, or rhonchi.  Cardiovascular: Normal S1, S2. Regular rate and rhythm. No peripheral edema, cyanosis or pallor.  Gastrointestinal:  Soft, nondistended, nontender. No rebound or guarding. Normal bowel sounds. Abdominal hernia noted below umbilicus.  Rectal:  Not performed.  Msk:  Symmetrical without gross deformities. Without edema, no deformity or joint abnormality.  Neurologic:  Alert and  oriented x4;  grossly normal  neurologically.  Skin:   Dry and intact without significant lesions or rashes.  RELEVANT LABS AND IMAGING: CBC    Latest Ref Rng & Units 03/18/2024    2:41 PM 04/25/2023    1:47 PM 02/17/2023    9:31 AM  CBC  WBC 4.0 - 10.5 K/uL 7.4  7.9  7.1   Hemoglobin 12.0 - 15.0 g/dL 86.5  87.6  87.5   Hematocrit 36.0 - 46.0 % 42.2  37.5  38.9   Platelets 150 - 400 K/uL 313  377  320.0      CMP     Latest Ref Rng & Units 03/18/2024    2:41 PM 04/25/2023    1:47 PM 02/17/2023    9:31 AM  CMP  Glucose 70 - 99 mg/dL 868  899  88   BUN 8 - 23 mg/dL 13  10  13    Creatinine 0.44 - 1.00 mg/dL 9.26  9.34  9.25   Sodium 135 - 145 mmol/L 140  141  140   Potassium 3.5 - 5.1 mmol/L 3.6  3.6  4.1   Chloride 98 - 111 mmol/L 103  103  102   CO2 22 - 32 mmol/L 27  27  32   Calcium  8.9 - 10.3 mg/dL 9.6  8.9  9.3   Total Protein 6.5 - 8.1 g/dL 7.8  6.7  6.8   Total Bilirubin 0.0 - 1.2 mg/dL 0.8  0.2  0.5   Alkaline Phos 38 - 126 U/L 45  48  45   AST 15 - 41 U/L 26  15  15    ALT 0 - 44 U/L 14  15  12       Lab Results  Component Value Date   TSH 0.81 02/10/2022     Assessment: Encounter Diagnoses  Name Primary?   Irritable bowel syndrome with diarrhea Yes   Gastroesophageal reflux disease, unspecified whether esophagitis present    Special screening for malignant neoplasms, colon      78 year old female patient that presents for follow-up on GERD and IBS-D.  We discussed decreasing her pantoprazole  from once daily to twice daily and taking Pepcid  at bedtime.  Reinforced GERD diet.  Her IBS is well-controlled with dicyclomine  and over-the-counter Imodium as needed.  Patient most times avoids food triggers which controls her symptoms.  Patient is due for colon screening colonoscopy will go ahead and schedule an LHC with Dr.McGreal.  Patient does not have any concerns at this time.  Overall she is doing well.  Plan: - Decrease pantoprazole  from twice daily to once daily, refilled -Continue famotidine  at  bedtime, refilled - Reinforced GERD diet -Avoid food triggers  -Recommend low fodmap diet - Continue Dicyclomine  as needed, refilled - Continue OTC imodium as needed -Schedule for a colonoscopy in [facility] with Dr. Suzann. The risks and benefits of colonoscopy with possible polypectomy / biopsies were discussed and the patient agrees to proceed.     Thank you for the courtesy of this consult. Please call me with any questions or concerns.   Kayin Osment, FNP-C [facility] 04/26/2024, 9:14 AM  Cc: Lendia Boby CROME, NP-C   I have reviewed the clinic note as outlined by Cathryne Beal, NP and agree with the assessment, plan and medical decision making.  Krystalle returns to the office for follow-up of GERD and IBS-D-both are stable on current medical therapies as outlined above.  She is due for screening colonoscopy and this will be scheduled.  Inocente Suzann, MD

## 2024-04-26 NOTE — Patient Instructions (Addendum)
 GERD Recommend GERD diet Continue Pantoprazole  40 mg po daily 1 tablet before breakfast Continue Pepcid  at bedtime  IBS-D Avoid food triggers  Recommend low fodmap diet Dicyclomine  as needed OTC imodium as needed  We have sent the following medications to your pharmacy for you to pick up at your convenience: SUPREP  You have been scheduled for a colonoscopy. Please follow written instructions given to you at your visit today.   If you use inhalers (even only as needed), please bring them with you on the day of your procedure.  DO NOT TAKE 7 DAYS PRIOR TO TEST- Trulicity (dulaglutide) Ozempic, Wegovy (semaglutide) Mounjaro (tirzepatide) Bydureon Bcise (exanatide extended release)  DO NOT TAKE 1 DAY PRIOR TO YOUR TEST Rybelsus (semaglutide) Adlyxin (lixisenatide) Victoza (liraglutide) Byetta (exanatide) ___________________________________________________________________________  Due to recent changes in healthcare laws, you may see the results of your imaging and laboratory studies on MyChart before your provider has had a chance to review them.  We understand that in some cases there may be results that are confusing or concerning to you. Not all laboratory results come back in the same time frame and the provider may be waiting for multiple results in order to interpret others.  Please give us  48 hours in order for your provider to thoroughly review all the results before contacting the office for clarification of your results.   _______________________________________________________  If your blood pressure at your visit was 140/90 or greater, please contact your primary care physician to follow up on this.  _______________________________________________________  If you are age 76 or older, your body mass index should be between 23-30. Your Body mass index is 35.33 kg/m. If this is out of the aforementioned range listed, please consider follow up with your Primary Care  Provider.  If you are age 76 or younger, your body mass index should be between 19-25. Your Body mass index is 35.33 kg/m. If this is out of the aformentioned range listed, please consider follow up with your Primary Care Provider.   ________________________________________________________  The [facility] providers would like to encourage you to use MYCHART to communicate with providers for non-urgent requests or questions.  Due to long hold times on the telephone, sending your provider a message by [facility] may be a faster and more efficient way to get a response.  Please allow 48 business hours for a response.  Please remember that this is for non-urgent requests.  _______________________________________________________  [facility] is using a team-based approach to care.  Your team is made up of your doctor and two to three APPS. Our APPS (Nurse Practitioners and Physician Assistants) work with your physician to ensure care continuity for you. They are fully qualified to address your health concerns and develop a treatment plan. They communicate directly with your gastroenterologist to care for you. Seeing the Advanced Practice Practitioners on your physician's team can help you by facilitating care more promptly, often allowing for earlier appointments, access to diagnostic testing, procedures, and other specialty referrals.    Thank you for trusting me with your gastrointestinal care. Deanna May, FNP-C

## 2024-05-02 ENCOUNTER — Encounter: Admitting: Physician Assistant

## 2024-05-02 DIAGNOSIS — S81811D Laceration without foreign body, right lower leg, subsequent encounter: Secondary | ICD-10-CM | POA: Diagnosis not present

## 2024-05-02 DIAGNOSIS — S81811A Laceration without foreign body, right lower leg, initial encounter: Secondary | ICD-10-CM | POA: Diagnosis not present

## 2024-05-04 ENCOUNTER — Encounter: Admitting: Family Medicine

## 2024-05-10 ENCOUNTER — Ambulatory Visit (HOSPITAL_BASED_OUTPATIENT_CLINIC_OR_DEPARTMENT_OTHER): Attending: Nurse Practitioner | Admitting: Pulmonary Disease

## 2024-05-10 DIAGNOSIS — G4733 Obstructive sleep apnea (adult) (pediatric): Secondary | ICD-10-CM | POA: Diagnosis not present

## 2024-05-12 ENCOUNTER — Other Ambulatory Visit: Payer: Self-pay

## 2024-05-12 DIAGNOSIS — M7989 Other specified soft tissue disorders: Secondary | ICD-10-CM

## 2024-05-12 DIAGNOSIS — I872 Venous insufficiency (chronic) (peripheral): Secondary | ICD-10-CM

## 2024-05-15 NOTE — Procedures (Signed)
 [facility] [address] [place], [place] [address] Tel: [phone]   Fax: [phone]  Titration Interpretation  Patient Name:  [name], [name] Date:  05/10/2024 Referring Physician:  KATHERINE COBB [id number]) %%startinterp%% Indications for Polysomnography The patient is a 78 year old Female who is 5' 1 and weighs 185.0 lbs. Her BMI equals 35.4.  A full night titration treatment study was performed. 03/31/2002 PSG: AHI 64  12/01/2007 BiPAP titration >> 14/10 cmH2O, centrals with higher pressures  09/19/2023 HST: AHI 26/h, SpO2 low 84%   Polysomnogram Data A full night polysomnogram recorded the standard physiologic parameters including EEG, EOG, EMG, EKG, nasal and oral airflow.  Respiratory parameters of chest and abdominal movements were recorded with Respiratory Inductance Plethysmography belts.  Oxygen saturation was recorded by pulse oximetry.   Sleep Architecture The total recording time of the polysomnogram was 378.3 minutes.  The total sleep time was 227.5 minutes.  The patient spent 29.2% of total sleep time in Stage N1, 48.1% in Stage N2, 20.9% in Stages N3, and 1.8% in REM.  Sleep latency was 5.8 minutes.  REM latency was 13.5 minutes.  Sleep Efficiency was 60.1%.  Wake after Sleep Onset time was 145.0 minutes.  Titration Summary The patient was titrated at pressures ranging from 8/4/0* cm/H20 up to 20/16/0* cm/H20. The last pressure used in the study was 20/16/0* cm/H20.  Respiratory Events The polysomnogram revealed a presence of 33 obstructive, 2 centrals, and - mixed apneas resulting in an Apnea index of 9.2 events per hour.  There were 119 hypopneas (>=3% desaturation and/or arousal) resulting in an Apnea\Hypopnea Index (AHI >=3% desaturation and/or arousal) of 40.6 events per hour.  There were 99 hypopneas (>=4% desaturation) resulting in an Apnea\Hypopnea Index (AHI >=4% desaturation) of 35.3 events per hour.  There were 9  Respiratory Effort Related Arousals resulting in a RERA index of 2.4 events per hour. The Respiratory Disturbance Index is 43.0 events per hour.  The snore index was - events per hour.  Mean oxygen saturation was 92.2%.  The lowest oxygen saturation during sleep was 76.0%.  Time spent <=88% oxygen saturation was 39.8 minutes (10.5%).  Limb Activity There were - limb movements recorded.  Of this total, - were classified as PLMs.  Of the PLMs, - were associated with arousals.  The Limb Movement index was - per hour while the PLM index was - per hour.  Cardiac Summary The average pulse rate was 59.8 bpm.  The minimum pulse rate was 49.0 bpm while the maximum pulse rate was 83.0 bpm.  Cardiac rhythm was normal/abnormal.  Comments: Poor sleep efficiency. Nasal pillows was changed to full face mask for better comfort  Diagnosis: OSA, corrected partially by Bilevel 20/16 cm Residual AHI of 11/h persisted at the final level of bipap  Recommendations: Trial of auto bipap , EPAP min 10, IPAP max 22, PS +4 cm with medium full face mask Close clinical follow up with compliance monitoring to optimize therapeutic efficiency Weight loss measures encouraged She should be cautioned against driving when sleepy & against medications with sedative side effects    This study was personally reviewed and electronically signed by: Dr. Harden Staff Accredited Board Certified in Sleep Medicine

## 2024-05-16 ENCOUNTER — Ambulatory Visit (INDEPENDENT_AMBULATORY_CARE_PROVIDER_SITE_OTHER)

## 2024-05-16 ENCOUNTER — Other Ambulatory Visit: Payer: Self-pay | Admitting: Family Medicine

## 2024-05-16 VITALS — Ht 61.0 in | Wt 185.0 lb

## 2024-05-16 DIAGNOSIS — Z0000 Encounter for general adult medical examination without abnormal findings: Secondary | ICD-10-CM

## 2024-05-16 DIAGNOSIS — G4733 Obstructive sleep apnea (adult) (pediatric): Secondary | ICD-10-CM

## 2024-05-16 NOTE — Progress Notes (Signed)
 Please attest and cosign this visit due to patients primary care provider not being in the office at the time the visit was completed.     Subjective:   [name] is a 78 y.o. who presents for a Medicare Wellness preventive visit.  As a reminder, Annual Wellness Visits don't include a physical exam, and some assessments may be limited, especially if this visit is performed virtually. We may recommend an in-person follow-up visit with your provider if needed.  Visit Complete: Virtual I connected with  [name] on 05/16/24 by a audio enabled telemedicine application and verified that I am speaking with the correct person using two identifiers.  Patient Location: Home  Provider Location: Office/Clinic  I discussed the limitations of evaluation and management by telemedicine. The patient expressed understanding and agreed to proceed.  Vital Signs: Because this visit was a virtual/telehealth visit, some criteria may be missing or patient reported. Any vitals not documented were not able to be obtained and vitals that have been documented are patient reported.  VideoDeclined- This patient declined Librarian, academic. Therefore the visit was completed with audio only.  Persons Participating in Visit: Patient.  AWV Questionnaire: No: Patient Medicare AWV questionnaire was not completed prior to this visit.  Cardiac Risk Factors include: advanced age (>71men, >62 women);dyslipidemia;hypertension;obesity (BMI >30kg/m2)     Objective:    Today's Vitals   05/16/24 1432 05/16/24 1433  Weight: 185 lb (83.9 kg)   Height: 5' 1 (1.549 m)   PainSc:  0-No pain   Body mass index is 34.96 kg/m.     05/16/2024    2:46 PM 05/10/2024    8:26 PM 07/13/2023   11:37 AM 06/25/2022    3:51 PM 01/20/2019   10:01 AM 09/12/2017   10:34 PM 05/15/2015    8:54 PM  Advanced Directives  Does Patient Have a Medical Advance Directive? Yes Yes Yes Yes Yes No  No    Type of Estate agent of [place];Living will Healthcare Power of [place];Living will Healthcare Power of [employer] of [place];Living will Healthcare Power of [place];Living will    Does patient want to make changes to medical advance directive?  No - Patient declined   No - Patient declined     Copy of Healthcare Power of Attorney in Chart? No - copy requested No - copy requested Yes - validated most recent copy scanned in chart (See row information) No - copy requested     Would patient like information on creating a medical advance directive?      No - Patient declined  No - patient declined information      Data saved with a previous flowsheet row definition    Current Medications (verified) Outpatient Encounter Medications as of 05/16/2024  Medication Sig   acetaminophen  (TYLENOL ) 325 MG tablet Take 650 mg by mouth every 6 (six) hours as needed. For pain   ALPRAZolam  (XANAX ) 0.25 MG tablet For anxiety 1  q day   buPROPion  (WELLBUTRIN  XL) 300 MG 24 hr tablet Take 1 tablet (300 mg total) by mouth daily.   Calcium  Carbonate-Vitamin D  600-400 MG-UNIT per tablet Take 1 tablet by mouth daily.   dicyclomine  (BENTYL ) 20 MG tablet TAKE 1 TABLET BY MOUTH 3 TIMES DAILY AS NEEDED FOR SPASMS.   famotidine  (PEPCID ) 20 MG tablet Take 1 tablet (20 mg total) by mouth at bedtime as needed. For allergies   fluticasone  (FLONASE ) 50 MCG/ACT nasal spray INSTILL  2 SPRAYS IN EACH NOSTRIL ONCE A DAY.   losartan  (COZAAR ) 50 MG tablet TAKE 1 TABLET BY MOUTH DAILY WITH HCTZ   losartan -hydrochlorothiazide  (HYZAAR) 50-12.5 MG tablet Take 1 tablet by mouth daily.   meloxicam  (MOBIC ) 7.5 MG tablet Take 7.5 mg by mouth daily.   Multiple Vitamin (MULTIVITAMIN WITH MINERALS) TABS tablet Take 1 tablet by mouth daily.   MYRBETRIQ 50 MG TB24 tablet Take 50 mg by mouth daily.   pantoprazole  (PROTONIX ) 40 MG tablet Take 1 tablet (40 mg total) by mouth daily. TAKE 1 TABLET BY MOUTH TWICE  A DAY BEFORE BREAKFAST AND DINNER   rosuvastatin  (CRESTOR ) 5 MG tablet Take 1 tablet (5 mg total) by mouth daily.   tolterodine (DETROL LA) 4 MG 24 hr capsule Take 4 mg by mouth at bedtime.   traMADol  (ULTRAM ) 50 MG tablet Take 50 mg by mouth every 6 (six) hours as needed.   Vilazodone  HCl (VIIBRYD ) 40 MG TABS Take 1 tablet (40 mg total) by mouth daily.   doxycycline  (MONODOX ) 100 MG capsule Take 100 mg by mouth 2 (two) times daily.   fluconazole (DIFLUCAN) 200 MG tablet Take by mouth.   methocarbamol (ROBAXIN) 500 MG tablet Take 500 mg by mouth 2 (two) times daily as needed.   No facility-administered encounter medications on file as of 05/16/2024.    Allergies (verified) Molds & smuts, Epinephrine, and Other   History: Past Medical History:  Diagnosis Date   10 year risk of MI or stroke 7.5% or greater 02/11/2022   ASCVD 10 yr risk 22.7%  Recommend starting statin    Allergic rhinitis    Allergy    Anxiety    Anxiety and depression    Arthritis    Asthma    years ago-1993-94   Barrett's esophagus 05/2005   Blood transfusion without reported diagnosis    as baby   Cancer (HCC)    Basal cell face   Cataract    bilateral removal   Clotting disorder    Arms bruise easily   Depression    Diverticulosis    Esophageal stricture    Gastric polyps    GERD (gastroesophageal reflux disease)    Hiatal hernia    Hypertension    on meds   Morbid obesity (HCC)    OSA (obstructive sleep apnea)    Osteoporosis    Pneumonia    many years ago   Positive PPD    Sleep apnea    bi pap   Ulcer    Past Surgical History:  Procedure Laterality Date   CESAREAN SECTION     x 2   CHOLECYSTECTOMY     COLONOSCOPY     ENDOSCOPIC RETROGRADE CHOLANGIOPANCREATOGRAPHY (ERCP) WITH PROPOFOL  N/A 09/17/2017   Procedure: ENDOSCOPIC RETROGRADE CHOLANGIOPANCREATOGRAPHY (ERCP) WITH PROPOFOL ;  Surgeon: Aneita Gwendlyn DASEN, MD;  Location: [facility] ENDOSCOPY;  Service: Endoscopy;  Laterality: N/A;   EYE  SURGERY     Cataracts   FINGER SURGERY     cyst removel from right index finger   FOOT FRACTURE SURGERY     right    FOOT NEUROMA SURGERY     left    FRACTURE SURGERY     Rt foot. Rt arm   HERNIA REPAIR     Umbilical   TONSILLECTOMY     TUBAL LIGATION  08/25/1979   UPPER GASTROINTESTINAL ENDOSCOPY     WRIST FRACTURE SURGERY     right    Family History  Problem Relation Age  of Onset   Allergies Mother    Alzheimer's disease Mother    Heart failure Mother    Arthritis Mother    Hearing loss Mother    Depression Mother    Coronary artery disease Father    Heart failure Father    Early death Father    Heart disease Father        Passed away with heart attack at 8   Obesity Father    Heart attack Father    Hypertension Brother        x 3   Diabetes Brother    Heart disease Brother        Has a pacemaker.  Getting valve replacement surgery this month   Obesity Brother    Sleep apnea Brother    Heart disease Brother    Stroke Brother    Heart attack Paternal Grandmother    Heart disease Paternal Grandmother        Heart attavk   Lung cancer Paternal Grandfather    Cancer Paternal Grandfather    Sleep apnea Son    Diabetes Son    Early death Son    Colon cancer Maternal Uncle    Hypertension Brother    Heart attack Brother    Heart disease Brother        Pacemaker   Rectal cancer Neg Hx    Stomach cancer Neg Hx    Esophageal cancer Neg Hx    Pancreatic cancer Neg Hx    Colon polyps Neg Hx    Social History   Socioeconomic History   Marital status: Widowed    Spouse name: Not on file   Number of children: 2   Years of education: Not on file   Highest education level: Associate degree: occupational, Scientist, product/process development, or vocational program  Occupational History   Occupation: Financial controller: OTHER    Comment: [employer] in [place]  Tobacco Use   Smoking status: Never   Smokeless tobacco: Never   Tobacco comments:    pt has never smoked  but husband smoked  Vaping Use   Vaping status: Never Used  Substance and Sexual Activity   Alcohol use: Not Currently   Drug use: Never   Sexual activity: Not Currently    Birth control/protection: Post-menopausal, Surgical, Other-see comments  Other Topics Concern   Not on file  Social History Narrative   She lives alone (widowed).   She works as Systems developer.   Highest level of education:  Some college   Social Drivers of Corporate investment banker Strain: Low Risk  (05/16/2024)   Overall Financial Resource Strain (CARDIA)    Difficulty of Paying Living Expenses: Not very hard  Food Insecurity: No Food Insecurity (05/16/2024)   Hunger Vital Sign    Worried About Running Out of Food in the Last Year: Never true    Ran Out of Food in the Last Year: Never true  Transportation Needs: No Transportation Needs (05/16/2024)   PRAPARE - Administrator, Civil Service (Medical): No    Lack of Transportation (Non-Medical): No  Physical Activity: Insufficiently Active (05/16/2024)   Exercise Vital Sign    Days of Exercise per Week: 3 days    Minutes of Exercise per Session: 10 min  Stress: No Stress Concern Present (05/16/2024)   [employer] of Occupational Health - Occupational Stress Questionnaire    Feeling of Stress: Only a little  Social Connections: Moderately Isolated (05/16/2024)  Social Connection and Isolation Panel    Frequency of Communication with Friends and Family: More than three times a week    Frequency of Social Gatherings with Friends and Family: Three times a week    Attends Religious Services: Never    Active Member of Clubs or Organizations: No    Attends Banker Meetings: 1 to 4 times per year    Marital Status: Widowed    Tobacco Counseling Counseling given: Not Answered Tobacco comments: pt has never smoked but husband smoked    Clinical Intake:  Pre-visit preparation completed: Yes  Pain : No/denies pain Pain  Score: 0-No pain     BMI - recorded: 34.96 Nutritional Status: BMI > 30  Obese Nutritional Risks: None Diabetes: No  No results found for: HGBA1C   How often do you need to have someone help you when you read instructions, pamphlets, or other written materials from your doctor or pharmacy?: 1 - Never  Interpreter Needed?: No  Comments: lives alone Information entered by :: B.Demani Weyrauch,LPN   Activities of Daily Living     05/16/2024    2:47 PM 07/13/2023   11:38 AM  In your present state of health, do you have any difficulty performing the following activities:  Hearing? 0 0  Vision? 0 0  Difficulty concentrating or making decisions? 0 0  Walking or climbing stairs? 0 0  Dressing or bathing? 0 0  Doing errands, shopping? 0 0  Preparing Food and eating ? N   Using the Toilet? N N  In the past six months, have you accidently leaked urine? N N  Do you have problems with loss of bowel control? N N  Managing your Medications? N N  Managing your Finances? N N  Housekeeping or managing your Housekeeping? N N    Patient Care Team: Lendia Boby CROME, NP-C as PCP - General (Family Medicine) Jeffrie Oneil BROCKS, MD as PCP - Cardiology (Cardiology) [facility] [place] as Consulting Physician (Optometry) Jeffrie, Oneil BROCKS, MD as Consulting Physician (Cardiology)  I have updated your Care Teams any recent Medical Services you may have received from other providers in the past year.     Assessment:   This is a routine wellness examination for [name].  Hearing/Vision screen Hearing Screening - Comments:: Patient denies any hearing difficulties.   Vision Screening - Comments:: Pt says their vision is good with glasses;one rt contact for up close reading Dr Lynwood Glenn   Goals Addressed             This Visit's Progress    My goal for 2024 is to lose some weight.  I would like to lose 20 pounds.       05/16/24-still working toward it (has lost)     Patient Stated        05/16/24-Travel more       Depression Screen     05/16/2024    2:42 PM 03/13/2024   10:01 AM 07/13/2023   11:40 AM 02/17/2023    8:33 AM 06/25/2022    4:09 PM 02/10/2022    9:34 AM 12/11/2021    1:42 PM  PHQ 2/9 Scores  PHQ - 2 Score 0 3 0 0 0 0 0  PHQ- 9 Score  5 0 0       Fall Risk     05/16/2024    2:35 PM 03/13/2024   10:00 AM 08/31/2023    9:28 AM 07/13/2023   11:37 AM  02/17/2023    8:33 AM  Fall Risk   Falls in the past year? 0 0 0 0 0  Number falls in past yr: 0 0  0 0  Injury with Fall? 0 0  0 0  Risk for fall due to : No Fall Risks No Fall Risks   No Fall Risks  Follow up Education provided;Falls prevention discussed Falls evaluation completed  Falls evaluation completed;Education provided;Falls prevention discussed Falls evaluation completed    MEDICARE RISK AT HOME:  Medicare Risk at Home Any stairs in or around the home?: Yes If so, are there any without handrails?: Yes Home free of loose throw rugs in walkways, pet beds, electrical cords, etc?: Yes Adequate lighting in your home to reduce risk of falls?: Yes Life alert?: No Use of a cane, walker or w/c?: No Grab bars in the bathroom?: No Shower chair or bench in shower?: Yes Elevated toilet seat or a handicapped toilet?: No  TIMED UP AND GO:  Was the test performed?  No  Cognitive Function: 6CIT completed      03/24/2017    2:10 PM  Montreal Cognitive Assessment   Visuospatial/ Executive (0/5) 4  Naming (0/3) 3  Attention: Read list of digits (0/2) 2  Attention: Read list of letters (0/1) 1  Attention: Serial 7 subtraction starting at 100 (0/3) 3  Language: Repeat phrase (0/2) 2  Language : Fluency (0/1) 1  Abstraction (0/2) 2  Delayed Recall (0/5) 4  Orientation (0/6) 5  Total 27  Adjusted Score (based on education) 27      05/16/2024    2:49 PM 07/13/2023   11:41 AM 06/25/2022    4:17 PM  6CIT Screen  What Year? 0 points 0 points 0 points  What month? 0 points 0 points 0 points  What time?  0 points 0 points 0 points  Count back from 20 0 points 0 points 0 points  Months in reverse 0 points 0 points 0 points  Repeat phrase 0 points 0 points 0 points  Total Score 0 points 0 points 0 points    Immunizations Immunization History  Administered Date(s) Administered   Fluad Quad(high Dose 65+) 05/13/2020, 05/20/2021, 05/19/2022   INFLUENZA, HIGH DOSE SEASONAL PF 06/05/2019   Influenza Split 05/24/2013, 05/10/2015, 05/23/2016   Influenza Whole 06/24/2009, 05/24/2010, 05/25/2011, 05/24/2012, 05/17/2017, 04/24/2018   Influenza,inj,Quad PF,6+ Mos 06/07/2014   Influenza-Unspecified 06/09/2017, 06/05/2019, 06/22/2023   PFIZER(Purple Top)SARS-COV-2 Vaccination 01/23/2020, 02/14/2020   Pneumococcal Conjugate-13 04/27/2014   Pneumococcal Polysaccharide-23 05/24/2008, 05/17/2012, 01/24/2019   Td 05/23/2009   Tdap 01/24/2019   Tetanus 08/24/2002    Screening Tests Health Maintenance  Topic Date Due   COVID-19 Vaccine (3 - Pfizer risk series) 03/13/2020   Influenza Vaccine  03/24/2024   Medicare Annual Wellness (AWV)  05/16/2025   DTaP/Tdap/Td (3 - Td or Tdap) 01/23/2029   Pneumococcal Vaccine: 50+ Years  Completed   DEXA SCAN  Completed   Hepatitis C Screening  Completed   HPV VACCINES  Aged Out   Meningococcal B Vaccine  Aged Out   Mammogram  Discontinued   Colonoscopy  Discontinued   Zoster Vaccines- Shingrix  Discontinued    Health Maintenance Items Addressed: None due at this time. Pt will receive vaccines at their pharmcy when decided to obtain   Additional Screening:  Vision Screening: Recommended annual ophthalmology exams for early detection of glaucoma and other disorders of the eye. Is the patient up to date with their annual eye exam?  Yes  Who is the provider or what is the name of the office in which the patient attends annual eye exams? Dr Maryl  Dental Screening: Recommended annual dental exams for proper oral hygiene  Community Resource Referral /  Chronic Care Management: CRR required this visit?  No   CCM required this visit?  No   Plan:    I have personally reviewed and noted the following in the patient's chart:   Medical and social history Use of alcohol, tobacco or illicit drugs  Current medications and supplements including opioid prescriptions. Patient is currently taking opioid prescriptions. Information provided to patient regarding non-opioid alternatives. Patient advised to discuss non-opioid treatment plan with their provider. Functional ability and status Nutritional status Physical activity Advanced directives List of other physicians Hospitalizations, surgeries, and ER visits in previous 12 months Vitals Screenings to include cognitive, depression, and falls Referrals and appointments  In addition, I have reviewed and discussed with patient certain preventive protocols, quality metrics, and best practice recommendations. A written personalized care plan for preventive services as well as general preventive health recommendations were provided to patient.   Erminio LITTIE Saris, LPN   [id number]   After Visit Summary: (MyChart) Due to this being a telephonic visit, the after visit summary with patients personalized plan was offered to patient via MyChart   Notes: Nothing significant to report at this time.

## 2024-05-16 NOTE — Patient Instructions (Signed)
 [name],  Thank you for taking the time for your Medicare Wellness Visit. I appreciate your continued commitment to your health goals. Please review the care plan we discussed, and feel free to reach out if I can assist you further.  Medicare recommends these wellness visits once per year to help you and your care team stay ahead of potential health issues. These visits are designed to focus on prevention, allowing your provider to concentrate on managing your acute and chronic conditions during your regular appointments.  Please note that Annual Wellness Visits do not include a physical exam. Some assessments may be limited, especially if the visit was conducted virtually. If needed, we may recommend a separate in-person follow-up with your provider.  Ongoing Care Seeing your primary care provider every 3 to 6 months helps us  monitor your health and provide consistent, personalized care.   Referrals If a referral was made during today's visit and you haven't received any updates within two weeks, please contact the referred provider directly to check on the status.  Recommended Screenings:  Health Maintenance  Topic Date Due   COVID-19 Vaccine (3 - Pfizer risk series) 03/13/2020   Flu Shot  03/24/2024   Medicare Annual Wellness Visit  05/16/2025   DTaP/Tdap/Td vaccine (3 - Td or Tdap) 01/23/2029   Pneumococcal Vaccine for age over 27  Completed   DEXA scan (bone density measurement)  Completed   Hepatitis C Screening  Completed   HPV Vaccine  Aged Out   Meningitis B Vaccine  Aged Out   Breast Cancer Screening  Discontinued   Colon Cancer Screening  Discontinued   Zoster (Shingles) Vaccine  Discontinued       05/10/2024    8:26 PM  Advanced Directives  Does Patient Have a Medical Advance Directive? Yes  Type of Estate agent of [place];Living will  Does patient want to make changes to medical advance directive? No - Patient declined  Copy of Healthcare  Power of Attorney in Chart? No - copy requested   Advance Care Planning is important because it: Ensures you receive medical care that aligns with your values, goals, and preferences. Provides guidance to your family and loved ones, reducing the emotional burden of decision-making during critical moments.  Vision: Annual vision screenings are recommended for early detection of glaucoma, cataracts, and diabetic retinopathy. These exams can also reveal signs of chronic conditions such as diabetes and high blood pressure.  Dental: Annual dental screenings help detect early signs of oral cancer, gum disease, and other conditions linked to overall health, including heart disease and diabetes.  Please see the attached documents for additional preventive care recommendations.

## 2024-05-18 ENCOUNTER — Encounter: Admitting: Family Medicine

## 2024-05-19 ENCOUNTER — Ambulatory Visit: Payer: Self-pay | Admitting: Nurse Practitioner

## 2024-05-19 DIAGNOSIS — G4733 Obstructive sleep apnea (adult) (pediatric): Secondary | ICD-10-CM

## 2024-05-19 NOTE — Progress Notes (Signed)
 She still required BiPAP therapy during her titration study. I've updated her settings to auto bipap , EPAP min 10, IPAP max 22, PS +4 cm with medium full face mask. She did still have some mild residual events on this. We may need to consider a sleep aid to help with tolerance/adjustment, or set pressure limits as sometimes this can help as well. She has an appt with Dr. Neda in a few weeks. Can review usage and BiPAP adjustments. If having worsening difficulties in the meantime, advise her to call.

## 2024-05-19 NOTE — Progress Notes (Signed)
 Correction: IPAP max set to 20 cmH2O. Thanks.

## 2024-05-26 ENCOUNTER — Ambulatory Visit: Attending: Cardiology | Admitting: Cardiology

## 2024-05-26 ENCOUNTER — Encounter: Payer: Self-pay | Admitting: Family Medicine

## 2024-05-26 ENCOUNTER — Ambulatory Visit: Admitting: Family Medicine

## 2024-05-26 VITALS — BP 122/82 | HR 59 | Ht 61.0 in | Wt 186.4 lb

## 2024-05-26 VITALS — BP 130/70 | HR 56 | Temp 97.7°F | Ht 61.0 in | Wt 186.0 lb

## 2024-05-26 DIAGNOSIS — G4733 Obstructive sleep apnea (adult) (pediatric): Secondary | ICD-10-CM | POA: Diagnosis not present

## 2024-05-26 DIAGNOSIS — Z23 Encounter for immunization: Secondary | ICD-10-CM

## 2024-05-26 DIAGNOSIS — F32A Depression, unspecified: Secondary | ICD-10-CM | POA: Diagnosis not present

## 2024-05-26 DIAGNOSIS — Z0000 Encounter for general adult medical examination without abnormal findings: Secondary | ICD-10-CM

## 2024-05-26 DIAGNOSIS — I10 Essential (primary) hypertension: Secondary | ICD-10-CM

## 2024-05-26 DIAGNOSIS — K219 Gastro-esophageal reflux disease without esophagitis: Secondary | ICD-10-CM | POA: Diagnosis not present

## 2024-05-26 DIAGNOSIS — Z8249 Family history of ischemic heart disease and other diseases of the circulatory system: Secondary | ICD-10-CM

## 2024-05-26 DIAGNOSIS — N3281 Overactive bladder: Secondary | ICD-10-CM | POA: Diagnosis not present

## 2024-05-26 DIAGNOSIS — M8580 Other specified disorders of bone density and structure, unspecified site: Secondary | ICD-10-CM | POA: Diagnosis not present

## 2024-05-26 DIAGNOSIS — Z9189 Other specified personal risk factors, not elsewhere classified: Secondary | ICD-10-CM | POA: Diagnosis not present

## 2024-05-26 DIAGNOSIS — E559 Vitamin D deficiency, unspecified: Secondary | ICD-10-CM | POA: Diagnosis not present

## 2024-05-26 DIAGNOSIS — Z0001 Encounter for general adult medical examination with abnormal findings: Secondary | ICD-10-CM

## 2024-05-26 DIAGNOSIS — E7800 Pure hypercholesterolemia, unspecified: Secondary | ICD-10-CM

## 2024-05-26 DIAGNOSIS — J310 Chronic rhinitis: Secondary | ICD-10-CM | POA: Diagnosis not present

## 2024-05-26 LAB — CBC WITH DIFFERENTIAL/PLATELET
Basophils Absolute: 0.1 K/uL (ref 0.0–0.1)
Basophils Relative: 1 % (ref 0.0–3.0)
Eosinophils Absolute: 0.1 K/uL (ref 0.0–0.7)
Eosinophils Relative: 1.4 % (ref 0.0–5.0)
HCT: 39.3 % (ref 36.0–46.0)
Hemoglobin: 13 g/dL (ref 12.0–15.0)
Lymphocytes Relative: 32.8 % (ref 12.0–46.0)
Lymphs Abs: 2.8 K/uL (ref 0.7–4.0)
MCHC: 33.1 g/dL (ref 30.0–36.0)
MCV: 89.4 fl (ref 78.0–100.0)
Monocytes Absolute: 0.6 K/uL (ref 0.1–1.0)
Monocytes Relative: 6.8 % (ref 3.0–12.0)
Neutro Abs: 4.9 K/uL (ref 1.4–7.7)
Neutrophils Relative %: 58 % (ref 43.0–77.0)
Platelets: 289 K/uL (ref 150.0–400.0)
RBC: 4.4 Mil/uL (ref 3.87–5.11)
RDW: 13.8 % (ref 11.5–15.5)
WBC: 8.4 K/uL (ref 4.0–10.5)

## 2024-05-26 LAB — COMPREHENSIVE METABOLIC PANEL WITH GFR
ALT: 11 U/L (ref 0–35)
AST: 16 U/L (ref 0–37)
Albumin: 4.2 g/dL (ref 3.5–5.2)
Alkaline Phosphatase: 48 U/L (ref 39–117)
BUN: 16 mg/dL (ref 6–23)
CO2: 31 meq/L (ref 19–32)
Calcium: 9.6 mg/dL (ref 8.4–10.5)
Chloride: 103 meq/L (ref 96–112)
Creatinine, Ser: 0.77 mg/dL (ref 0.40–1.20)
GFR: 74.96 mL/min (ref >60.00)
Glucose, Bld: 88 mg/dL (ref 70–99)
Potassium: 3.7 meq/L (ref 3.5–5.1)
Sodium: 142 meq/L (ref 135–145)
Total Bilirubin: 0.4 mg/dL (ref 0.2–1.2)
Total Protein: 7 g/dL (ref 6.0–8.3)

## 2024-05-26 LAB — VITAMIN D 25 HYDROXY (VIT D DEFICIENCY, FRACTURES): VITD: 22.49 ng/mL — ABNORMAL LOW (ref 30.00–100.00)

## 2024-05-26 LAB — LIPID PANEL
Cholesterol: 142 mg/dL (ref 0–200)
HDL: 44.8 mg/dL (ref >39.00)
LDL Cholesterol: 70 mg/dL (ref 0–99)
NonHDL: 97.41
Total CHOL/HDL Ratio: 3
Triglycerides: 135 mg/dL (ref 0.0–149.0)
VLDL: 27 mg/dL (ref 0.0–40.0)

## 2024-05-26 LAB — T4, FREE: Free T4: 0.67 ng/dL (ref 0.60–1.60)

## 2024-05-26 LAB — TSH: TSH: 1.15 u[IU]/mL (ref 0.35–5.50)

## 2024-05-26 MED ORDER — FLUTICASONE PROPIONATE 50 MCG/ACT NA SUSP: NASAL | 10 refills | Status: AC

## 2024-05-26 NOTE — Patient Instructions (Signed)
 Medication Instructions:  Your physician recommends that you continue on your current medications as directed. Please refer to the Current Medication list given to you today.  *If you need a refill on your cardiac medications before your next appointment, please call your pharmacy*  Lab Work: None ordered  Testing/Procedures: None ordered  Follow-Up: At [facility], you and your health needs are our priority.  As part of our continuing mission to provide you with exceptional heart care, our providers are all part of one team.  This team includes your primary Cardiologist (physician) and Advanced Practice Providers or APPs (Physician Assistants and Nurse Practitioners) who all work together to provide you with the care you need, when you need it.  Your next appointment:   1 year(s)  Provider:   One of our Advanced Practice Providers (APPs): Morse Clause, PA-C  Lamarr Satterfield, NP Miriam Shams, NP  Olivia Pavy, PA-C Josefa Beauvais, NP  Leontine Salen, PA-C Orren Fabry, PA-C  [place], PA-C Ernest Dick, NP  Damien Braver, NP Jon Hails, PA-C  Waddell Donath, PA-C    Dayna Dunn, PA-C  Scott Weaver, PA-C Lum Louis, NP Katlyn West, NP Callie Goodrich, PA-C  Xika Zhao, NP Sheng Haley, PA-C    Kathleen Carlo, PA-C      Thank you for choosing [facility]!!   [phone]

## 2024-05-26 NOTE — Progress Notes (Signed)
 Complete physical exam  Patient: [name]   DOB: [birth year]   78 y.o. Female  MRN: [id number]  Subjective:    Chief Complaint  Patient presents with   Medical Management of Chronic Issues    CPE-pt is fasting   She is here for a complete physical exam.  Discussed the use of AI scribe software for clinical note transcription with the patient, who gave verbal consent to proceed.  History of Present Illness [name] is a 78 year old female who presents for her annual physical exam and follow-up on chronic health conditions.  Cardiovascular evaluation and edema - Recent cardiac evaluation with normal results - Lower extremity swelling managed with hydrochlorothiazide  and compression stockings, though stockings are not worn consistently - No numbness or tingling associated with swelling  Hernia symptoms - Presence of a hernia that is not painful - Concern about engaging in physical activities due to hernia  Bone health - overdue for DEXA - Discontinued Fosamax  after five years of use - Currently taking calcium  and vitamin D  supplementation  Overactive bladder symptoms - Overactive bladder managed with Myrbetriq and Detrol LA under care of a urologist  Nasal congestion - Nasal congestion managed with Flonase   Anxiety and depression - Anxiety managed with alprazolam  - Depression managed with Viibryd  and Wellbutrin  - Under psychiatric care for anxiety and depression  Hypertension and hyperlipidemia - Hypertension managed with losartan  hydrochlorothiazide  combination - Hyperlipidemia managed with Crestor   Gastrointestinal symptoms - Acid reflux managed with Protonix  and Pepcid  - Abdominal spasms managed with dicyclomine  as needed    Health Maintenance  Topic Date Due   COVID-19 Vaccine (3 - Pfizer risk series) 03/13/2020   Medicare Annual Wellness Visit  05/16/2025   DTaP/Tdap/Td vaccine (3 - Td or Tdap) 01/23/2029   Pneumococcal Vaccine for age over  43  Completed   Flu Shot  Completed   DEXA scan (bone density measurement)  Completed   Hepatitis C Screening  Completed   Meningitis B Vaccine  Aged Out   Breast Cancer Screening  Discontinued   Colon Cancer Screening  Discontinued   Zoster (Shingles) Vaccine  Discontinued     Depression screening:    05/16/2024    2:42 PM 03/13/2024   10:01 AM 07/13/2023   11:40 AM  Depression screen PHQ 2/9  Decreased Interest 0 3 0  Down, Depressed, Hopeless 0 0 0  PHQ - 2 Score 0 3 0  Altered sleeping  1 0  Tired, decreased energy  1 0  Change in appetite  0 0  Feeling bad or failure about yourself   0 0  Trouble concentrating  0 0  Moving slowly or fidgety/restless  0 0  Suicidal thoughts  0 0  PHQ-9 Score  5 0  Difficult doing work/chores  Somewhat difficult Not difficult at all   Anxiety Screening:     No data to display           Patient Care Team: Lendia Boby CROME, NP-C as PCP - General (Family Medicine) Jeffrie Oneil BROCKS, MD as PCP - Cardiology (Cardiology) [facility] [place], [place] as Consulting Physician (Optometry) Jeffrie Oneil BROCKS, MD as Consulting Physician (Cardiology)   Outpatient Medications Prior to Visit  Medication Sig   acetaminophen  (TYLENOL ) 325 MG tablet Take 650 mg by mouth every 6 (six) hours as needed. For pain   ALPRAZolam  (XANAX ) 0.25 MG tablet For anxiety 1  q day (Patient taking differently: Take 0.25 mg by mouth  daily as needed for anxiety. For anxiety 1  q day)   buPROPion  (WELLBUTRIN  XL) 300 MG 24 hr tablet Take 1 tablet (300 mg total) by mouth daily.   Calcium  Carbonate-Vitamin D  600-400 MG-UNIT per tablet Take 1 tablet by mouth daily.   dicyclomine  (BENTYL ) 20 MG tablet TAKE 1 TABLET BY MOUTH 3 TIMES DAILY AS NEEDED FOR SPASMS.   famotidine  (PEPCID ) 20 MG tablet Take 1 tablet (20 mg total) by mouth at bedtime as needed. For allergies   losartan -hydrochlorothiazide  (HYZAAR) 50-12.5 MG tablet Take 1 tablet by mouth daily.   Multiple Vitamin (MULTIVITAMIN  WITH MINERALS) TABS tablet Take 1 tablet by mouth daily.   MYRBETRIQ 50 MG TB24 tablet Take 50 mg by mouth daily.   pantoprazole  (PROTONIX ) 40 MG tablet Take 1 tablet (40 mg total) by mouth daily. TAKE 1 TABLET BY MOUTH TWICE A DAY BEFORE BREAKFAST AND DINNER   rosuvastatin  (CRESTOR ) 5 MG tablet Take 1 tablet (5 mg total) by mouth daily.   tolterodine (DETROL LA) 4 MG 24 hr capsule Take 4 mg by mouth at bedtime.   Vilazodone  HCl (VIIBRYD ) 40 MG TABS Take 1 tablet (40 mg total) by mouth daily.   [DISCONTINUED] fluticasone  (FLONASE ) 50 MCG/ACT nasal spray INSTILL 2 SPRAYS IN EACH NOSTRIL ONCE A DAY.   [DISCONTINUED] losartan  (COZAAR ) 50 MG tablet TAKE 1 TABLET BY MOUTH DAILY WITH HCTZ   [DISCONTINUED] doxycycline  (MONODOX ) 100 MG capsule Take 100 mg by mouth 2 (two) times daily.   [DISCONTINUED] fluconazole (DIFLUCAN) 200 MG tablet Take by mouth.   [DISCONTINUED] meloxicam  (MOBIC ) 7.5 MG tablet Take 7.5 mg by mouth daily.   [DISCONTINUED] methocarbamol (ROBAXIN) 500 MG tablet Take 500 mg by mouth 2 (two) times daily as needed.   [DISCONTINUED] traMADol  (ULTRAM ) 50 MG tablet Take 50 mg by mouth every 6 (six) hours as needed.   No facility-administered medications prior to visit.    Review of Systems  Constitutional:  Negative for chills, fever and malaise/fatigue.  HENT:  Negative for ear pain, hearing loss and sore throat.   Eyes:  Negative for blurred vision and double vision.  Respiratory:  Negative for shortness of breath.   Cardiovascular:  Negative for chest pain, palpitations and leg swelling.  Gastrointestinal:  Negative for abdominal pain, constipation, diarrhea, nausea and vomiting.  Genitourinary:  Negative for dysuria, frequency and urgency.  Musculoskeletal:  Negative for falls.  Neurological:  Negative for dizziness, focal weakness and headaches.  Psychiatric/Behavioral:  Negative for depression and memory loss. The patient is not nervous/anxious.        Objective:    BP  130/70 (BP Location: Left Arm, Patient Position: Sitting)   Pulse (!) 56   Temp 97.7 F (36.5 C) (Temporal)   Ht 5' 1 (1.549 m)   Wt 186 lb (84.4 kg)   SpO2 98%   BMI 35.14 kg/m  BP Readings from Last 3 Encounters:  05/26/24 130/70  05/26/24 122/82  04/26/24 120/66   Wt Readings from Last 3 Encounters:  05/26/24 186 lb (84.4 kg)  05/26/24 186 lb 6.4 oz (84.6 kg)  05/16/24 185 lb (83.9 kg)    Physical Exam Constitutional:      General: She is not in acute distress.    Appearance: She is not ill-appearing.  HENT:     Right Ear: Tympanic membrane, ear canal and external ear normal.     Left Ear: Tympanic membrane, ear canal and external ear normal.     Nose: Nose normal.  Mouth/Throat:     Mouth: Mucous membranes are moist.     Pharynx: Oropharynx is clear.  Eyes:     Extraocular Movements: Extraocular movements intact.     Conjunctiva/sclera: Conjunctivae normal.     Pupils: Pupils are equal, round, and reactive to light.  Neck:     Thyroid : No thyroid  mass, thyromegaly or thyroid  tenderness.  Cardiovascular:     Rate and Rhythm: Normal rate and regular rhythm.     Pulses: Normal pulses.     Heart sounds: Normal heart sounds.     Comments: Trace pitting edema bil Pulmonary:     Effort: Pulmonary effort is normal.     Breath sounds: Normal breath sounds.  Abdominal:     General: Bowel sounds are normal.     Palpations: Abdomen is soft.     Tenderness: There is no abdominal tenderness. There is no right CVA tenderness, left CVA tenderness, guarding or rebound.     Hernia: A hernia is present. Hernia is present in the ventral area.     Comments: Hernia easily compressible and non tender   Musculoskeletal:        General: Normal range of motion.     Cervical back: Normal range of motion and neck supple. No tenderness.  Lymphadenopathy:     Cervical: No cervical adenopathy.  Skin:    General: Skin is warm and dry.     Findings: No lesion or rash.  Neurological:      General: No focal deficit present.     Mental Status: She is alert and oriented to person, place, and time.     Cranial Nerves: No cranial nerve deficit.     Sensory: No sensory deficit.     Motor: No weakness.     Gait: Gait normal.  Psychiatric:        Mood and Affect: Mood normal.        Behavior: Behavior normal.        Thought Content: Thought content normal.      Results for orders placed or performed in visit on 05/26/24  Lipid panel  Result Value Ref Range   Cholesterol 142 0 - 200 mg/dL   Triglycerides 864.9 0.0 - 149.0 mg/dL   HDL 55.19 >60.99 mg/dL   VLDL 72.9 0.0 - 59.9 mg/dL   LDL Cholesterol 70 0 - 99 mg/dL   Total CHOL/HDL Ratio 3    NonHDL 97.41   T4, free  Result Value Ref Range   Free T4 0.67 0.60 - 1.60 ng/dL  TSH  Result Value Ref Range   TSH 1.15 0.35 - 5.50 uIU/mL  VITAMIN D  25 Hydroxy (Vit-D Deficiency, Fractures)  Result Value Ref Range   VITD 22.49 (L) 30.00 - 100.00 ng/mL  Comprehensive metabolic panel with GFR  Result Value Ref Range   Sodium 142 135 - 145 mEq/L   Potassium 3.7 3.5 - 5.1 mEq/L   Chloride 103 96 - 112 mEq/L   CO2 31 19 - 32 mEq/L   Glucose, Bld 88 70 - 99 mg/dL   BUN 16 6 - 23 mg/dL   Creatinine, Ser 9.22 0.40 - 1.20 mg/dL   Total Bilirubin 0.4 0.2 - 1.2 mg/dL   Alkaline Phosphatase 48 39 - 117 U/L   AST 16 0 - 37 U/L   ALT 11 0 - 35 U/L   Total Protein 7.0 6.0 - 8.3 g/dL   Albumin 4.2 3.5 - 5.2 g/dL   GFR 25.03 >39.99 mL/min  Calcium  9.6 8.4 - 10.5 mg/dL  CBC with Differential/Platelet  Result Value Ref Range   WBC 8.4 4.0 - 10.5 K/uL   RBC 4.40 3.87 - 5.11 Mil/uL   Hemoglobin 13.0 12.0 - 15.0 g/dL   HCT 60.6 63.9 - 53.9 %   MCV 89.4 78.0 - 100.0 fl   MCHC 33.1 30.0 - 36.0 g/dL   RDW 86.1 88.4 - 84.4 %   Platelets 289.0 150.0 - 400.0 K/uL   Neutrophils Relative % 58.0 43.0 - 77.0 %   Lymphocytes Relative 32.8 12.0 - 46.0 %   Monocytes Relative 6.8 3.0 - 12.0 %   Eosinophils Relative 1.4 0.0 - 5.0 %    Basophils Relative 1.0 0.0 - 3.0 %   Neutro Abs 4.9 1.4 - 7.7 K/uL   Lymphs Abs 2.8 0.7 - 4.0 K/uL   Monocytes Absolute 0.6 0.1 - 1.0 K/uL   Eosinophils Absolute 0.1 0.0 - 0.7 K/uL   Basophils Absolute 0.1 0.0 - 0.1 K/uL      Assessment & Plan:    Routine Health Maintenance and Physical Exam Problem List Items Addressed This Visit     10 year risk of MI or stroke 7.5% or greater   Relevant Orders   Lipid panel (Completed)   Anxiety and depression   Encounter for general adult medical examination with abnormal findings - Primary   Essential hypertension   Relevant Orders   CBC with Differential/Platelet (Completed)   Comprehensive metabolic panel with GFR (Completed)   TSH (Completed)   T4, free (Completed)   GERD   HYPERCHOLESTEROLEMIA   Relevant Orders   Lipid panel (Completed)   OAB (overactive bladder)   Obstructive sleep apnea   Osteopenia   Relevant Orders   DG Bone Density   RHINITIS, CHRONIC   Relevant Medications   fluticasone  (FLONASE ) 50 MCG/ACT nasal spray   Vitamin D  deficiency   Relevant Orders   VITAMIN D  25 Hydroxy (Vit-D Deficiency, Fractures) (Completed)   Other Visit Diagnoses       Immunization due       Relevant Orders   Flu vaccine HIGH DOSE PF(Fluzone Trivalent) (Completed)       Assessment and Plan Assessment & Plan Adult Wellness Visit Annual physical exam conducted. Preventive health care reviewed.  Counseling on healthy lifestyle including diet and exercise.  Recommend regular dental and eye exams.  Immunizations reviewed.  Discussed safety.  Blood work was last done in July, and cholesterol was checked in November of last year. - Order labs  Essential hypertension Currently managed with losartan - hydrochlorothiazide  combination, prescribed by cardiologist.  Osteopenia Fosamax  was discontinued after five years of use. - Order bone density and she will schedule   Vitamin D  deficiency Managed with calcium  carbonate-vitamin D   supplementation.  Chronic rhinitis Experiencing nasal congestion, especially at night. Flonase  is effective in managing symptoms. - Prescribe Flonase  for nasal congestion.  Overactive bladder Managed with Myrbetriq and Detrol LA. Symptoms are well-controlled.  Gastroesophageal reflux disease Managed with daily Protonix  and as-needed Pepcid . Symptoms are well-controlled.  Anxiety and depression Anxiety managed with alprazolam  as needed. Depression managed with Wellbutrin  and Vibrant. Symptoms are well-controlled.  Ventral hernia Long-standing ventral hernia with no pain or significant changes. She is hesitant about surgical intervention and declines referral. - Monitor hernia for any changes or symptoms.  Peripheral edema Intermittent swelling managed with hydrochlorothiazide . Compression stockings are used as needed. - Advise to monitor salt intake and elevate legs when sitting.  General Health Maintenance Discussion  about the importance of preventive care, including colonoscopy. She initially hesitated but agreed to proceed with scheduled colonoscopy.  - Proceed with scheduled colonoscopy on November 14th. - Schedule bone density test at [facility]     Return in about 6 months (around 11/24/2024) for chronic health conditions.     Boby Mackintosh, NP-C

## 2024-05-26 NOTE — Progress Notes (Signed)
 Cardiology Office Note:  .   Date:  05/26/2024  ID:  [name], DOB 28-Aug-1945, MRN [id number] PCP: Lendia Boby CROME, NP-C  [facility] Providers Cardiologist:  Oneil Parchment, MD    History of Present Illness: .   [name] is a 78 y.o. female Discussed the use of AI scribe software for clinical note transcription with the patient, who gave verbal consent to proceed.  History of Present Illness [name] is a 78 year old female with coronary artery disease who presents for a follow-up visit.  She underwent a coronary artery calcium  score test on May 11, 2023, which showed a score of four and a very small amount of coronary calcium  in the left circumflex artery. She has a strong family history of coronary artery disease, with her father passing away at age 53 from a heart attack and her brothers having heart-related issues, including one with a pacemaker.  No current symptoms such as chest pain or difficulty with physical activity. She is able to walk long distances without any problems.  Her current medications include rosuvastatin  5 mg daily and a combination pill of losartan  50 mg with hydrochlorothiazide  12.5 mg. Her prior LDL was 123, and it is now 104 as of July 14, 2023. Other lab results include TSH at 0.8, creatinine at 0.65, and hemoglobin at 12.3.  She does not smoke or consume alcohol. She is trying to maintain a healthy diet, although she finds it challenging to give up junk food. She eats with her brother, who has a pacemaker, three times a week, and he encourages her to eat healthily.       Studies Reviewed: SABRA   EKG Interpretation Date/Time:  Friday May 26 2024 08:09:09 EDT Ventricular Rate:  53 PR Interval:  168 QRS Duration:  86 QT Interval:  432 QTC Calculation: 405 R Axis:   -13  Text Interpretation: Sinus bradycardia Minimal voltage criteria for LVH, may be normal variant ( R in aVL ) Poor R wave progression Nonspecific T  wave abnormality no longer evident in Lateral leads Confirmed by Parchment Oneil ([id number]) on 05/26/2024 8:14:51 AM    Results LABS LDL: 104 (07/14/2023) TSH: 0.8 Creatinine: 0.65 Hemoglobin: 12.3  RADIOLOGY Coronary artery calcium  score: 4, indicating a very small amount of coronary calcium  in the left circumflex artery (05/11/2023)  DIAGNOSTIC EKG: NSR PRWP Risk Assessment/Calculations:            Physical Exam:   VS:  BP 122/82 (Cuff Size: Normal)   Pulse (!) 59   Ht 5' 1 (1.549 m)   Wt 186 lb 6.4 oz (84.6 kg)   BMI 35.22 kg/m    Wt Readings from Last 3 Encounters:  05/26/24 186 lb 6.4 oz (84.6 kg)  05/16/24 185 lb (83.9 kg)  05/10/24 185 lb (83.9 kg)    GEN: Well nourished, well developed in no acute distress NECK: No JVD; No carotid bruits CARDIAC: RRR, no murmurs, no rubs, no gallops RESPIRATORY:  Clear to auscultation without rales, wheezing or rhonchi  ABDOMEN: Soft, non-tender, non-distended EXTREMITIES:  No edema; No deformity   ASSESSMENT AND PLAN: .    Assessment and Plan Assessment & Plan Coronary artery atherosclerosis with minimal calcified plaque Coronary artery calcium  score of 4, indicating a very low level of calcified plaque in the left circumflex artery. Strong family history of coronary artery disease with a father who passed away at 78 from a myocardial infarction and brothers with heart-related issues.  Currently asymptomatic with no chest pain or exertional symptoms. - Continue rosuvastatin  5 mg daily to manage plaque and prevent myocardial infarction. - Encourage regular physical activity and adherence to a Mediterranean diet. - Schedule follow-up in one year with an advanced practice provider unless symptoms arise.  Hypertension Blood pressure is well-controlled on current medication regimen with a combination pill of losartan  50 mg and hydrochlorothiazide  12.5 mg daily. - Continue losartan  50 mg and hydrochlorothiazide  12.5 mg daily.          Dispo: 1 yr APP  Signed, Oneil Parchment, MD

## 2024-05-28 ENCOUNTER — Ambulatory Visit: Payer: Self-pay | Admitting: Family Medicine

## 2024-05-28 DIAGNOSIS — K469 Unspecified abdominal hernia without obstruction or gangrene: Secondary | ICD-10-CM

## 2024-05-29 ENCOUNTER — Other Ambulatory Visit: Payer: Self-pay | Admitting: Cardiology

## 2024-05-31 ENCOUNTER — Ambulatory Visit (INDEPENDENT_AMBULATORY_CARE_PROVIDER_SITE_OTHER)
Admission: RE | Admit: 2024-05-31 | Discharge: 2024-05-31 | Disposition: A | Discharge status: Home / Self Care | Source: Ambulatory Visit | Attending: Family Medicine | Admitting: Family Medicine

## 2024-05-31 DIAGNOSIS — M8580 Other specified disorders of bone density and structure, unspecified site: Secondary | ICD-10-CM

## 2024-06-01 ENCOUNTER — Other Ambulatory Visit

## 2024-06-08 ENCOUNTER — Ambulatory Visit (INDEPENDENT_AMBULATORY_CARE_PROVIDER_SITE_OTHER): Admitting: Pulmonary Disease

## 2024-06-08 ENCOUNTER — Encounter: Payer: Self-pay | Admitting: Pulmonary Disease

## 2024-06-08 VITALS — BP 139/60 | HR 56 | Temp 97.7°F | Ht 61.0 in | Wt 183.0 lb

## 2024-06-08 DIAGNOSIS — G4733 Obstructive sleep apnea (adult) (pediatric): Secondary | ICD-10-CM | POA: Diagnosis not present

## 2024-06-08 NOTE — Patient Instructions (Signed)
 Placed order for chinstrap  DME referral to change pressure from maximal IPAP 20 minimum EPAP of 10 to maximum IPAP of 16  Follow-up in about 4 to 6 weeks  Call us  with significant concerns

## 2024-06-08 NOTE — Progress Notes (Signed)
 [name]    [id number]    1946/07/14  Primary Care Physician:Henson, Boby CROME, NP-C  Referring Physician: Lendia Boby CROME, NP-C [address] [place],  [place] [address]  Chief complaint:   Patient with obstructive sleep apnea  HPI:  Patient with obstructive sleep apnea having mask issues  Diagnosed with sleep apnea 03/31/2022 with an AHI of 64 BiPAP titration 12/01/2007, titrated to BiPAP 14/10 Home sleep study 09/19/2023 AHI of 26 with saturations of 94% nadir  Mask issues Was struggling with fullface mask Currently using a nasal mask and finds this to most comfortable  Sometimes wakes up with a mask of face She does try to use it every night and she feels she sleeps well  Occasional dryness of her mouth  Initial study was done with her being very heavy, managed to lose over 60 pounds She definitely feels a fullface mask is too suffocating and will not be able to tolerated  Outpatient Encounter Medications as of 06/08/2024  Medication Sig   acetaminophen  (TYLENOL ) 325 MG tablet Take 650 mg by mouth every 6 (six) hours as needed. For pain   ALPRAZolam  (XANAX ) 0.25 MG tablet For anxiety 1  q day (Patient taking differently: For anxiety 1  q day. prn)   buPROPion  (WELLBUTRIN  XL) 300 MG 24 hr tablet Take 1 tablet (300 mg total) by mouth daily.   Calcium  Carbonate-Vitamin D  600-400 MG-UNIT per tablet Take 1 tablet by mouth daily.   dicyclomine  (BENTYL ) 20 MG tablet TAKE 1 TABLET BY MOUTH 3 TIMES DAILY AS NEEDED FOR SPASMS.   famotidine  (PEPCID ) 20 MG tablet Take 1 tablet (20 mg total) by mouth at bedtime as needed. For allergies   fluticasone  (FLONASE ) 50 MCG/ACT nasal spray INSTILL 2 SPRAYS IN EACH NOSTRIL ONCE A DAY.   losartan -hydrochlorothiazide  (HYZAAR) 50-12.5 MG tablet Take 1 tablet by mouth daily.   Multiple Vitamin (MULTIVITAMIN WITH MINERALS) TABS tablet Take 1 tablet by mouth daily.   MYRBETRIQ 50 MG TB24 tablet Take 50 mg by mouth daily.    pantoprazole  (PROTONIX ) 40 MG tablet Take 1 tablet (40 mg total) by mouth daily. TAKE 1 TABLET BY MOUTH TWICE A DAY BEFORE BREAKFAST AND DINNER   rosuvastatin  (CRESTOR ) 5 MG tablet TAKE 1 TABLET BY MOUTH DAILY   tolterodine (DETROL LA) 4 MG 24 hr capsule Take 4 mg by mouth at bedtime.   Vilazodone  HCl (VIIBRYD ) 40 MG TABS Take 1 tablet (40 mg total) by mouth daily.   No facility-administered encounter medications on file as of 06/08/2024.    Allergies as of 06/08/2024 - Review Complete 06/08/2024  Allergen Reaction Noted   Molds & smuts Other (See Comments) 04/24/2022   Epinephrine     Other  12/11/2021    Past Medical History:  Diagnosis Date   10 year risk of MI or stroke 7.5% or greater 02/11/2022   ASCVD 10 yr risk 22.7%  Recommend starting statin    Allergic rhinitis    Allergy    Anxiety    Anxiety and depression    Arthritis    Asthma    years ago-1993-94   Barrett's esophagus 05/2005   Blood transfusion without reported diagnosis    as baby   Cancer (HCC)    Basal cell face   Cataract    bilateral removal   Clotting disorder    Arms bruise easily   Depression  Diverticulosis    Esophageal stricture    Gastric polyps    GERD (gastroesophageal reflux disease)    Hiatal hernia    Hypertension    on meds   Morbid obesity (HCC)    OSA (obstructive sleep apnea)    Osteoporosis    Pneumonia    many years ago   Positive PPD    Sleep apnea    bi pap   Ulcer     Past Surgical History:  Procedure Laterality Date   CESAREAN SECTION     x 2   CHOLECYSTECTOMY     COLONOSCOPY     ENDOSCOPIC RETROGRADE CHOLANGIOPANCREATOGRAPHY (ERCP) WITH PROPOFOL  N/A 09/17/2017   Procedure: ENDOSCOPIC RETROGRADE CHOLANGIOPANCREATOGRAPHY (ERCP) WITH PROPOFOL ;  Surgeon: Aneita Gwendlyn DASEN, MD;  Location: [facility] ENDOSCOPY;  Service: Endoscopy;  Laterality: N/A;   EYE SURGERY     Cataracts   FINGER SURGERY     cyst removel from right index finger   FOOT FRACTURE SURGERY     right     FOOT NEUROMA SURGERY     left    FRACTURE SURGERY     Rt foot. Rt arm   HERNIA REPAIR     Umbilical   TONSILLECTOMY     TUBAL LIGATION  08/25/1979   UPPER GASTROINTESTINAL ENDOSCOPY     WRIST FRACTURE SURGERY     right     Family History  Problem Relation Age of Onset   Allergies Mother    Alzheimer's disease Mother    Heart failure Mother    Arthritis Mother    Hearing loss Mother    Depression Mother    Coronary artery disease Father    Heart failure Father    Early death Father    Heart disease Father        Passed away with heart attack at 2   Obesity Father    Heart attack Father    Hypertension Brother        x 3   Diabetes Brother    Heart disease Brother        Has a pacemaker.  Getting valve replacement surgery this month   Obesity Brother    Sleep apnea Brother    Heart disease Brother    Stroke Brother    Heart attack Paternal Grandmother    Heart disease Paternal Grandmother        Heart attavk   Lung cancer Paternal Grandfather    Cancer Paternal Grandfather    Sleep apnea Son    Diabetes Son    Early death Son    Colon cancer Maternal Uncle    Hypertension Brother    Heart attack Brother    Heart disease Brother        Pacemaker   Rectal cancer Neg Hx    Stomach cancer Neg Hx    Esophageal cancer Neg Hx    Pancreatic cancer Neg Hx    Colon polyps Neg Hx     Social History   Socioeconomic History   Marital status: Widowed    Spouse name: Not on file   Number of children: 2   Years of education: Not on file   Highest education level: Associate degree: occupational, Scientist, product/process development, or vocational program  Occupational History   Occupation: Financial controller: OTHER    Comment: [employer] in [place]  Tobacco Use   Smoking status: Never   Smokeless tobacco: Never   Tobacco comments:    pt  has never smoked but husband smoked  Vaping Use   Vaping status: Never Used  Substance and Sexual Activity   Alcohol use: Not  Currently   Drug use: Never   Sexual activity: Not Currently    Birth control/protection: Post-menopausal, Surgical, Other-see comments  Other Topics Concern   Not on file  Social History Narrative   She lives alone (widowed).   She works as Systems developer.   Highest level of education:  Some college   Social Drivers of Corporate investment banker Strain: Low Risk  (05/16/2024)   Overall Financial Resource Strain (CARDIA)    Difficulty of Paying Living Expenses: Not very hard  Food Insecurity: No Food Insecurity (05/16/2024)   Hunger Vital Sign    Worried About Running Out of Food in the Last Year: Never true    Ran Out of Food in the Last Year: Never true  Transportation Needs: No Transportation Needs (05/16/2024)   PRAPARE - Administrator, Civil Service (Medical): No    Lack of Transportation (Non-Medical): No  Physical Activity: Insufficiently Active (05/16/2024)   Exercise Vital Sign    Days of Exercise per Week: 3 days    Minutes of Exercise per Session: 10 min  Stress: No Stress Concern Present (05/16/2024)   [employer] of Occupational Health - Occupational Stress Questionnaire    Feeling of Stress: Only a little  Social Connections: Moderately Isolated (05/16/2024)   Social Connection and Isolation Panel    Frequency of Communication with Friends and Family: More than three times a week    Frequency of Social Gatherings with Friends and Family: Three times a week    Attends Religious Services: Never    Active Member of Clubs or Organizations: No    Attends Banker Meetings: 1 to 4 times per year    Marital Status: Widowed  Intimate Partner Violence: Not At Risk (05/16/2024)   Humiliation, Afraid, Rape, and Kick questionnaire    Fear of Current or Ex-Partner: No    Emotionally Abused: No    Physically Abused: No    Sexually Abused: No    Review of Systems  Respiratory:  Positive for apnea. Negative for shortness of breath.    Psychiatric/Behavioral:  Positive for sleep disturbance.     Vitals:   06/08/24 1118  BP: 139/60  Pulse: (!) 56  Temp: 97.7 F (36.5 C)  SpO2: 95%     Physical Exam Constitutional:      Appearance: She is obese.  HENT:     Head: Normocephalic.     Mouth/Throat:     Mouth: Mucous membranes are moist.  Eyes:     General: No scleral icterus.    Pupils: Pupils are equal, round, and reactive to light.  Cardiovascular:     Rate and Rhythm: Normal rate and regular rhythm.     Heart sounds: No murmur heard.    No friction rub.  Pulmonary:     Effort: No respiratory distress.     Breath sounds: No stridor. No wheezing or rhonchi.  Musculoskeletal:     Cervical back: No rigidity or tenderness.  Neurological:     Mental Status: She is alert.  Psychiatric:        Mood and Affect: Mood normal.      Data Reviewed: Compliance data reviewed showing 80% compliance average use of 2 hours 43 minutes The auto maximum IPAP of 20, minimum EPAP of 10 with a pressure support of 4  Residual AHI of 19.2 Medium mask leak of 33.4 with maximum of 82.5    Assessment/Plan: Obstructive sleep apnea - Patient is tolerating the current mask much better, she is using a nasal pillow at present  Did not tolerate any other masks  She is willing to continue to use her CPAP on a regular basis  Other options of treatment of her sleep apnea was discussed with her today including an inspire device as an option of treatment for CPAP intolerance  We talked about using a chinstrap to help with oral venting, using a mouth tape with CPAP in place also can be considered  Encourage weight loss measures  I will see her again in about 4 to 6 weeks to try and encourage her to continue to stay compliant with CPAP use  Jennet Epley MD [facility] 06/08/2024, 12:14 PM  CC: Lendia Boby CROME, NP-C

## 2024-06-14 ENCOUNTER — Ambulatory Visit: Admitting: Family Medicine

## 2024-06-15 ENCOUNTER — Ambulatory Visit (INDEPENDENT_AMBULATORY_CARE_PROVIDER_SITE_OTHER): Admitting: Physician Assistant

## 2024-06-15 ENCOUNTER — Ambulatory Visit (HOSPITAL_COMMUNITY)
Admission: RE | Admit: 2024-06-15 | Discharge: 2024-06-15 | Disposition: A | Discharge status: Home / Self Care | Source: Ambulatory Visit | Attending: Vascular Surgery | Admitting: Vascular Surgery

## 2024-06-15 VITALS — BP 135/67 | HR 65 | Temp 98.3°F | Wt 183.7 lb

## 2024-06-15 DIAGNOSIS — I872 Venous insufficiency (chronic) (peripheral): Secondary | ICD-10-CM | POA: Insufficient documentation

## 2024-06-15 DIAGNOSIS — I10 Essential (primary) hypertension: Secondary | ICD-10-CM | POA: Diagnosis not present

## 2024-06-15 NOTE — Progress Notes (Signed)
 Requested by:  Bethena Andre Cava III, PA-C [address] [place],  [place] [address]  Reason for consultation: right leg wound   History of Present Illness   [name] is a 78 y.o. ([birth year]) female who presents for evaluation of a right leg wound.  The patient states back in June this year she accidentally ran into a bulletin board at home and accidentally scraped off a large part of her skin.  A week after developing this wound she was able to see the wound center for care.  She says there was a 1 week period in July where her entire right lower leg was very swollen.  She says that the provider at the wound care center was worried about venous insufficiency causing her swelling and possibly complicating her wound healing process.  At that time she was also on an antibiotic, which had a noted side effect of leg swelling.  Thankfully her wound healed by August.  She denies any prior history of slow to heal wounds.  Prior to this event, she denies any significant lower leg or ankle swelling.  She says over the past year she has had intermittent foot swelling, left greater than right.  She says that her feet will swell up for a day randomly and then get better by the next day.  This does not cause her any discomfort.  She typically treats this with compression stockings.  Otherwise she does not regularly wear her stockings or elevate her legs.  Past Medical History:  Diagnosis Date   10 year risk of MI or stroke 7.5% or greater 02/11/2022   ASCVD 10 yr risk 22.7%  Recommend starting statin    Allergic rhinitis    Allergy    Anxiety    Anxiety and depression    Arthritis    Asthma    years ago-1993-94   Barrett's esophagus 05/2005   Blood transfusion without reported diagnosis    as baby   Cancer (HCC)    Basal cell face   Cataract    bilateral removal   Clotting disorder    Arms bruise easily   Depression    Diverticulosis    Esophageal stricture    Gastric  polyps    GERD (gastroesophageal reflux disease)    Hiatal hernia    Hypertension    on meds   Morbid obesity (HCC)    OSA (obstructive sleep apnea)    Osteoporosis    Pneumonia    many years ago   Positive PPD    Sleep apnea    bi pap   Ulcer     Past Surgical History:  Procedure Laterality Date   CESAREAN SECTION     x 2   CHOLECYSTECTOMY     COLONOSCOPY     ENDOSCOPIC RETROGRADE CHOLANGIOPANCREATOGRAPHY (ERCP) WITH PROPOFOL  N/A 09/17/2017   Procedure: ENDOSCOPIC RETROGRADE CHOLANGIOPANCREATOGRAPHY (ERCP) WITH PROPOFOL ;  Surgeon: Aneita Gwendlyn DASEN, MD;  Location: [facility] ENDOSCOPY;  Service: Endoscopy;  Laterality: N/A;   EYE SURGERY     Cataracts   FINGER SURGERY     cyst removel from right index finger   FOOT FRACTURE SURGERY     right    FOOT NEUROMA SURGERY     left    FRACTURE SURGERY     Rt foot. Rt arm   HERNIA REPAIR     Umbilical   TONSILLECTOMY     TUBAL LIGATION  08/25/1979   UPPER GASTROINTESTINAL ENDOSCOPY  WRIST FRACTURE SURGERY     right     Social History   Socioeconomic History   Marital status: Widowed    Spouse name: Not on file   Number of children: 2   Years of education: Not on file   Highest education level: Associate degree: occupational, Scientist, product/process development, or vocational program  Occupational History   Occupation: Financial controller: OTHER    Comment: [employer] in [place]  Tobacco Use   Smoking status: Never   Smokeless tobacco: Never   Tobacco comments:    pt has never smoked but husband smoked  Vaping Use   Vaping status: Never Used  Substance and Sexual Activity   Alcohol use: Not Currently   Drug use: Never   Sexual activity: Not Currently    Birth control/protection: Post-menopausal, Surgical, Other-see comments  Other Topics Concern   Not on file  Social History Narrative   She lives alone (widowed).   She works as Systems developer.   Highest level of education:  Some college   Social Drivers of Manufacturing engineer Strain: Low Risk  (05/16/2024)   Overall Financial Resource Strain (CARDIA)    Difficulty of Paying Living Expenses: Not very hard  Food Insecurity: No Food Insecurity (05/16/2024)   Hunger Vital Sign    Worried About Running Out of Food in the Last Year: Never true    Ran Out of Food in the Last Year: Never true  Transportation Needs: No Transportation Needs (05/16/2024)   PRAPARE - Administrator, Civil Service (Medical): No    Lack of Transportation (Non-Medical): No  Physical Activity: Insufficiently Active (05/16/2024)   Exercise Vital Sign    Days of Exercise per Week: 3 days    Minutes of Exercise per Session: 10 min  Stress: No Stress Concern Present (05/16/2024)   [employer] of Occupational Health - Occupational Stress Questionnaire    Feeling of Stress: Only a little  Social Connections: Moderately Isolated (05/16/2024)   Social Connection and Isolation Panel    Frequency of Communication with Friends and Family: More than three times a week    Frequency of Social Gatherings with Friends and Family: Three times a week    Attends Religious Services: Never    Active Member of Clubs or Organizations: No    Attends Banker Meetings: 1 to 4 times per year    Marital Status: Widowed  Intimate Partner Violence: Not At Risk (05/16/2024)   Humiliation, Afraid, Rape, and Kick questionnaire    Fear of Current or Ex-Partner: No    Emotionally Abused: No    Physically Abused: No    Sexually Abused: No    Family History  Problem Relation Age of Onset   Allergies Mother    Alzheimer's disease Mother    Heart failure Mother    Arthritis Mother    Hearing loss Mother    Depression Mother    Coronary artery disease Father    Heart failure Father    Early death Father    Heart disease Father        Passed away with heart attack at 57   Obesity Father    Heart attack Father    Hypertension Brother        x 3   Diabetes Brother     Heart disease Brother        Has a pacemaker.  Getting valve replacement surgery this month  Obesity Brother    Sleep apnea Brother    Heart disease Brother    Stroke Brother    Heart attack Paternal Grandmother    Heart disease Paternal Grandmother        Heart attavk   Lung cancer Paternal Grandfather    Cancer Paternal Grandfather    Sleep apnea Son    Diabetes Son    Early death Son    Colon cancer Maternal Uncle    Hypertension Brother    Heart attack Brother    Heart disease Brother        Pacemaker   Rectal cancer Neg Hx    Stomach cancer Neg Hx    Esophageal cancer Neg Hx    Pancreatic cancer Neg Hx    Colon polyps Neg Hx     Current Outpatient Medications  Medication Sig Dispense Refill   acetaminophen  (TYLENOL ) 325 MG tablet Take 650 mg by mouth every 6 (six) hours as needed. For pain     ALPRAZolam  (XANAX ) 0.25 MG tablet For anxiety 1  q day (Patient taking differently: For anxiety 1  q day. prn) 20 tablet 3   buPROPion  (WELLBUTRIN  XL) 300 MG 24 hr tablet Take 1 tablet (300 mg total) by mouth daily. 30 tablet 6   Calcium  Carbonate-Vitamin D  600-400 MG-UNIT per tablet Take 1 tablet by mouth daily.     dicyclomine  (BENTYL ) 20 MG tablet TAKE 1 TABLET BY MOUTH 3 TIMES DAILY AS NEEDED FOR SPASMS. 90 tablet 8   famotidine  (PEPCID ) 20 MG tablet Take 1 tablet (20 mg total) by mouth at bedtime as needed. For allergies 30 tablet 11   fluticasone  (FLONASE ) 50 MCG/ACT nasal spray INSTILL 2 SPRAYS IN EACH NOSTRIL ONCE A DAY. 16 g 10   losartan -hydrochlorothiazide  (HYZAAR) 50-12.5 MG tablet Take 1 tablet by mouth daily. 90 tablet 3   Multiple Vitamin (MULTIVITAMIN WITH MINERALS) TABS tablet Take 1 tablet by mouth daily.     MYRBETRIQ 50 MG TB24 tablet Take 50 mg by mouth daily.     pantoprazole  (PROTONIX ) 40 MG tablet Take 1 tablet (40 mg total) by mouth daily. TAKE 1 TABLET BY MOUTH TWICE A DAY BEFORE BREAKFAST AND DINNER 30 tablet 2   rosuvastatin  (CRESTOR ) 5 MG tablet TAKE  1 TABLET BY MOUTH DAILY 90 tablet 3   tolterodine (DETROL LA) 4 MG 24 hr capsule Take 4 mg by mouth at bedtime.     Vilazodone  HCl (VIIBRYD ) 40 MG TABS Take 1 tablet (40 mg total) by mouth daily. 30 tablet 7   No current facility-administered medications for this visit.    Allergies  Allergen Reactions   Molds & Smuts Other (See Comments)   Epinephrine     REACTION: tachycardia   Other     Tree And Shrub Pollen    REVIEW OF SYSTEMS (negative unless checked):   Cardiac:  []  Chest pain or chest pressure? []  Shortness of breath upon activity? []  Shortness of breath when lying flat? []  Irregular heart rhythm?  Vascular:  []  Pain in calf, thigh, or hip brought on by walking? []  Pain in feet at night that wakes you up from your sleep? []  Blood clot in your veins? [x]  Leg swelling?  Pulmonary:  []  Oxygen at home? []  Productive cough? []  Wheezing?  Neurologic:  []  Sudden weakness in arms or legs? []  Sudden numbness in arms or legs? []  Sudden onset of difficult speaking or slurred speech? []  Temporary loss of vision in one eye? []   Problems with dizziness?  Gastrointestinal:  []  Blood in stool? []  Vomited blood?  Genitourinary:  []  Burning when urinating? []  Blood in urine?  Psychiatric:  []  Major depression  Hematologic:  []  Bleeding problems? []  Problems with blood clotting?  Dermatologic:  [x]  Rashes or ulcers?  Constitutional:  []  Fever or chills?  Ear/Nose/Throat:  []  Change in hearing? []  Nose bleeds? []  Sore throat?  Musculoskeletal:  []  Back pain? []  Joint pain? []  Muscle pain?   Physical Examination    There were no vitals filed for this visit. There is no height or weight on file to calculate BMI.  General:  WDWN in NAD; vital signs documented above Gait: Not observed HENT: WNL, normocephalic Pulmonary: normal non-labored breathing  Cardiac: regular Abdomen: soft, NT, no masses Skin: without rashes Vascular Exam/Pulses: Palpable  right DP pulse Extremities: RLE without varicose veins, with reticular veins, with mild lower leg and ankle edema, with stasis pigmentation, without lipodermatosclerosis, without ulcers Musculoskeletal: no muscle wasting or atrophy  Neurologic: A&O X 3;  No focal weakness or paresthesias are detected Psychiatric:  The pt has Normal affect.  Non-invasive Vascular Imaging   RLE Venous Insufficiency Duplex (06/15/2024):  +--------------+---------+------+-----------+------------+--------+  RIGHT        Reflux NoRefluxReflux TimeDiameter cmsComments                          Yes                                   +--------------+---------+------+-----------+------------+--------+  CFV                    yes   >1 second                       +--------------+---------+------+-----------+------------+--------+  FV mid        no                                              +--------------+---------+------+-----------+------------+--------+  Popliteal    no                                              +--------------+---------+------+-----------+------------+--------+  GSV at SFJ              yes    >500 ms      .73               +--------------+---------+------+-----------+------------+--------+  GSV prox thighno                            .35               +--------------+---------+------+-----------+------------+--------+  GSV mid thigh no                            .28               +--------------+---------+------+-----------+------------+--------+  GSV dist thighno                            .  24               +--------------+---------+------+-----------+------------+--------+  GSV at knee   no                            .24               +--------------+---------+------+-----------+------------+--------+  GSV prox calf no                            .24                +--------------+---------+------+-----------+------------+--------+  SSV at [facility]    no                            .12               +--------------+---------+------+-----------+------------+--------+  SSV prox calf no                            .13               +--------------+---------+------+-----------+------------+--------+    Medical Decision Making   [name] is a 78 y.o. female who presents for evaluation of a right leg wound  Based on the patient's duplex, there is reflux in the right common femoral vein and right greater saphenous vein at the saphenofemoral junction.  The remainder the patient's deep and superficial venous system on the right is competent.  She is not a candidate for saphenous vein ablation. The patient states she developed a wound to her right lower leg after sustaining trauma to it back in June.  Ultimately she went to the wound care center for treatment.  During her treatment, she did have a 1 week period of severe right lower leg and ankle swelling.  Her provider was concerned about venous insufficiency and wound healing issues.  Her wound healed by August and she has not had any other wounds since. She denies any significant prior history of lower leg or ankle swelling.  She does have intermittent bilateral feet swelling, but this does not bother her.  This is well-controlled with intermittent use of compression stockings.  She denies any prior history of venous ulcerations. On exam her previous wound to the right lower leg is well-healed.  She does have mild right lower leg swelling with some stasis pigmentation.  She also has several spider veins on both legs.  She has a palpable right pedal pulse. I have explained to the patient that she does have evidence of venous insufficiency, however this appears mild.  She is not a candidate for intervention.  Potentially her venous insufficiency contributed to her swelling event in the summer, however she  also was on antibiotics at that time with a side effect of swelling.  Overall her symptoms can be well-controlled with continued conservative therapy including compression stockings and leg elevation. She can follow-up with our office as needed   [name] SHAUNNA Holster, PA-C [facility] Office: [phone]  06/15/2024, 12:51 PM  Clinic MD: [place]

## 2024-06-21 ENCOUNTER — Encounter: Payer: Self-pay | Admitting: Family Medicine

## 2024-06-21 ENCOUNTER — Ambulatory Visit (INDEPENDENT_AMBULATORY_CARE_PROVIDER_SITE_OTHER): Admitting: Family Medicine

## 2024-06-21 VITALS — BP 122/64 | HR 63 | Temp 97.6°F | Ht 61.0 in | Wt 186.0 lb

## 2024-06-21 DIAGNOSIS — M8580 Other specified disorders of bone density and structure, unspecified site: Secondary | ICD-10-CM | POA: Diagnosis not present

## 2024-06-21 DIAGNOSIS — Z87311 Personal history of (healed) other pathological fracture: Secondary | ICD-10-CM

## 2024-06-21 DIAGNOSIS — Z9189 Other specified personal risk factors, not elsewhere classified: Secondary | ICD-10-CM

## 2024-06-21 DIAGNOSIS — E559 Vitamin D deficiency, unspecified: Secondary | ICD-10-CM | POA: Diagnosis not present

## 2024-06-21 MED ORDER — ALENDRONATE SODIUM 70 MG PO TABS: 70.0000 mg | ORAL_TABLET | ORAL | 11 refills | Status: AC

## 2024-06-21 NOTE — Patient Instructions (Signed)
 Take the alendronate  once weekly in the morning on an empty stomach with a full glass of water. Stay upright after taking the medication.   I will see you back in 3 months.    Alendronate ; Cholecalciferol Tablets What is this medication? ALENDRONATE ; CHOLECALCIFEROL (a LEN droe nate; KOL e cal SIF er ol) treats osteoporosis. It works by interior and spatial designer stronger and less likely to break (fracture). It is a combination of a bisphosphonate and a form of vitamin D . This medicine may be used for other purposes; ask your health care provider or pharmacist if you have questions. COMMON BRAND NAME(S): Fosamax  Plus D What should I tell my care team before I take this medication? They need to know if you have any of these conditions: Bleeding disorder Cancer Dental conditions High levels of vitamin D  in your blood Infection Kidney disease Low levels of calcium  or other minerals in your blood Low red blood cell levels Stomach or intestine problems Taking steroids, such as dexamethasone  or prednisone  Trouble sitting or standing for 30 minutes Trouble swallowing An unusual or allergic reaction to alendronate , cholecalciferol, other medications, foods, dyes, or preservatives Pregnant or trying to get pregnant Breastfeeding How should I use this medication? Take this medication by mouth with a full glass of water. Take it as directed on the prescription label on the same day each week. Take the dose right after waking up. Do not eat or drink anything before taking it. Do not take it with any other drink except water. Do not chew or crush the tablet. After taking it, do not eat breakfast, drink, or take any other medications or vitamins for at least 30 minutes. Sit or stand up for at least 30 minutes after you take it. Do not lie down. Keep taking it unless your care team tells you to stop. A special MedGuide will be given to you by the pharmacist with each prescription and refill. Be sure to read  this information carefully each time. Talk to your care team about the use of this medication in children. Special care may be needed. Overdosage: If you think you have taken too much of this medicine contact a poison control center or emergency room at once. NOTE: This medicine is only for you. Do not share this medicine with others. What if I miss a dose? If you take your medication once a day, skip it. Take your next dose at the scheduled time the next morning. Do not take two doses on the same day. If you take your medication once a week, take the missed dose on the morning after you remember. Do not take two doses on the same day. What may interact with this medication? Aluminum hydroxide Antacids Aspirin Calcium  supplements Certain medications for seizures, such as phenytoin Cholestyramine Cimetidine Colestipol  Iron supplements Magnesium supplements NSAIDs, medications for pain and inflammation, such as ibuprofen or naproxen Orlistat Thiazide diuretics Vitamins with minerals This list may not describe all possible interactions. Give your health care provider a list of all the medicines, herbs, non-prescription drugs, or dietary supplements you use. Also tell them if you smoke, drink alcohol, or use illegal drugs. Some items may interact with your medicine. What should I watch for while using this medication? Visit your care team for regular checks on your progress. It may be some time before you see the benefit from this medication. Some people who take this medication have severe bone, joint, or muscle pain. This medication may also increase your risk  for jaw problems or a broken thigh bone. Tell your care team right away if you have severe pain in your jaw, bones, joints, or muscles. Tell your care team if you have any pain that does not go away or that gets worse. You should make sure you get enough calcium  and vitamin D  while you are taking this medication. Discuss the foods you  eat and the vitamins you take with your care team. Tell your dentist and dental surgeon that you are taking this medication. You should not have major dental surgery while on this medication. See your dentist to have a dental exam and fix any dental problems before starting this medication. Take good care of your teeth while on this medication. Make sure you see your dentist for regular follow-up appointments. What side effects may I notice from receiving this medication? Side effects that you should report to your care team as soon as possible: Allergic reactions--skin rash, itching, hives, swelling of the face, lips, tongue, or throat Low calcium  level--muscle pain or cramps, confusion, tingling, or numbness in the hands or feet Osteonecrosis of the jaw--pain, swelling, or redness in the mouth, numbness of the jaw, poor healing after dental work, unusual discharge from the mouth, visible bones in the mouth Pain or trouble swallowing Severe bone, joint, or muscle pain Stomach bleeding--bloody or black, tar-like stools, vomiting blood or brown material that looks like coffee grounds Side effects that usually do not require medical attention (report to your care team if they continue or are bothersome): Constipation Diarrhea Nausea Stomach pain This list may not describe all possible side effects. Call your doctor for medical advice about side effects. You may report side effects to FDA at 1-800-FDA-1088. Where should I keep my medication? Keep out of the reach of children and pets. Store at room temperature between 20 and 25 degrees C (68 and 77 degrees F). Protect from light and moisture. Keep this medication in the original container until you are ready to take it. Get rid of any unused medication after the expiration date. To get rid of medications that are no longer needed or have expired: Take the medication to a take-back program. Check with your pharmacy or law enforcement to find a  location. If you cannot return the medication, check the label or package insert to see if the medication should be thrown out in the garbage or flushed down the toilet. If you are not sure, ask your care team. If it is safe to put it in the trash, empty the medication out of the container. Mix the medication with cat litter, dirt, coffee grounds, or other unwanted substance. Seal the mixture in a bag or container. Put it in the trash. NOTE: This sheet is a summary. It may not cover all possible information. If you have questions about this medicine, talk to your doctor, pharmacist, or health care provider.  2024 Elsevier/Gold Standard (2023-04-01 00:00:00)

## 2024-06-21 NOTE — Progress Notes (Signed)
 Subjective:     Patient ID: [name], female    DOB: 1945-09-28, 78 y.o.   MRN: [id number]  Chief Complaint  Patient presents with   Results    Discuss recent DEXA results and treatment options     HPI  Discussed the use of AI scribe software for clinical note transcription with the patient, who gave verbal consent to proceed.  History of Present Illness [name] is a 78 year old female with osteoporosis who presents to discuss her recent bone density test results.  Osteoporosis and bone health - Osteoporosis with history of fragility fractures over thirty years ago, including a wrist fracture - No recent fractures - Recent bone density test performed - Family history of osteoporosis, details unclear   Bisphosphonate therapy - On alendronate  since 2021 - Uncertain if alendronate  was discontinued in 2022 or 2023 - No major adverse effects from alendronate  - No worsening of gastroesophageal reflux with alendronate   Gastroesophageal reflux disease - Takes Protonix  for acid reflux - Attempting to reduce Protonix  frequency to every other day  Calcium  and vitamin d  supplementation - Takes approximately 1000 IUs of vitamin D  daily after previously low levels - Takes 600 mg of calcium  daily, cautious not to exceed this dose        There are no preventive care reminders to display for this patient.  Past Medical History:  Diagnosis Date   10 year risk of MI or stroke 7.5% or greater 02/11/2022   ASCVD 10 yr risk 22.7%  Recommend starting statin    Allergic rhinitis    Allergy    Anxiety    Anxiety and depression    Arthritis    Asthma    years ago-1993-94   Barrett's esophagus 05/2005   Blood transfusion without reported diagnosis    as baby   Cancer (HCC)    Basal cell face   Cataract    bilateral removal   Clotting disorder    Arms bruise easily   Depression    Diverticulosis    Esophageal stricture    Gastric polyps    GERD  (gastroesophageal reflux disease)    Hiatal hernia    Hypertension    on meds   Morbid obesity (HCC)    OSA (obstructive sleep apnea)    Osteoporosis    Pneumonia    many years ago   Positive PPD    Sleep apnea    bi pap   Ulcer     Past Surgical History:  Procedure Laterality Date   CESAREAN SECTION     x 2   CHOLECYSTECTOMY     COLONOSCOPY     ENDOSCOPIC RETROGRADE CHOLANGIOPANCREATOGRAPHY (ERCP) WITH PROPOFOL  N/A 09/17/2017   Procedure: ENDOSCOPIC RETROGRADE CHOLANGIOPANCREATOGRAPHY (ERCP) WITH PROPOFOL ;  Surgeon: Aneita Gwendlyn DASEN, MD;  Location: [facility] ENDOSCOPY;  Service: Endoscopy;  Laterality: N/A;   EYE SURGERY     Cataracts   FINGER SURGERY     cyst removel from right index finger   FOOT FRACTURE SURGERY     right    FOOT NEUROMA SURGERY     left    FRACTURE SURGERY     Rt foot. Rt arm   HERNIA REPAIR     Umbilical   TONSILLECTOMY     TUBAL LIGATION  08/25/1979   UPPER GASTROINTESTINAL ENDOSCOPY     WRIST FRACTURE SURGERY     right     Family History  Problem Relation Age of Onset  Allergies Mother    Alzheimer's disease Mother    Heart failure Mother    Arthritis Mother    Hearing loss Mother    Depression Mother    Coronary artery disease Father    Heart failure Father    Early death Father    Heart disease Father        Passed away with heart attack at 68   Obesity Father    Heart attack Father    Hypertension Brother        x 3   Diabetes Brother    Heart disease Brother        Has a pacemaker.  Getting valve replacement surgery this month   Obesity Brother    Sleep apnea Brother    Heart disease Brother    Stroke Brother    Heart attack Paternal Grandmother    Heart disease Paternal Grandmother        Heart attavk   Lung cancer Paternal Grandfather    Cancer Paternal Grandfather    Sleep apnea Son    Diabetes Son    Early death Son    Colon cancer Maternal Uncle    Hypertension Brother    Heart attack Brother    Heart disease  Brother        Pacemaker   Rectal cancer Neg Hx    Stomach cancer Neg Hx    Esophageal cancer Neg Hx    Pancreatic cancer Neg Hx    Colon polyps Neg Hx     Social History   Socioeconomic History   Marital status: Widowed    Spouse name: Not on file   Number of children: 2   Years of education: Not on file   Highest education level: Associate degree: occupational, scientist, product/process development, or vocational program  Occupational History   Occupation: Financial Controller: OTHER    Comment: [employer] in [place]  Tobacco Use   Smoking status: Never   Smokeless tobacco: Never   Tobacco comments:    pt has never smoked but husband smoked  Vaping Use   Vaping status: Never Used  Substance and Sexual Activity   Alcohol use: Not Currently   Drug use: Never   Sexual activity: Not Currently    Birth control/protection: Post-menopausal, Surgical, Other-see comments  Other Topics Concern   Not on file  Social History Narrative   She lives alone (widowed).   She works as systems developer.   Highest level of education:  Some college   Social Drivers of Corporate Investment Banker Strain: Low Risk  (06/17/2024)   Overall Financial Resource Strain (CARDIA)    Difficulty of Paying Living Expenses: Not hard at all  Food Insecurity: No Food Insecurity (06/17/2024)   Hunger Vital Sign    Worried About Running Out of Food in the Last Year: Never true    Ran Out of Food in the Last Year: Never true  Transportation Needs: No Transportation Needs (06/17/2024)   PRAPARE - Administrator, Civil Service (Medical): No    Lack of Transportation (Non-Medical): No  Physical Activity: Insufficiently Active (06/17/2024)   Exercise Vital Sign    Days of Exercise per Week: 2 days    Minutes of Exercise per Session: 20 min  Stress: No Stress Concern Present (06/17/2024)   [employer] of Occupational Health - Occupational Stress Questionnaire    Feeling of Stress: Not at all   Social Connections: Socially Isolated (06/17/2024)  Social Connection and Isolation Panel    Frequency of Communication with Friends and Family: More than three times a week    Frequency of Social Gatherings with Friends and Family: More than three times a week    Attends Religious Services: Never    Database Administrator or Organizations: No    Attends Engineer, Structural: Not on file    Marital Status: Widowed  Intimate Partner Violence: Not At Risk (05/16/2024)   Humiliation, Afraid, Rape, and Kick questionnaire    Fear of Current or Ex-Partner: No    Emotionally Abused: No    Physically Abused: No    Sexually Abused: No    Outpatient Medications Prior to Visit  Medication Sig Dispense Refill   acetaminophen  (TYLENOL ) 325 MG tablet Take 650 mg by mouth every 6 (six) hours as needed. For pain     ALPRAZolam  (XANAX ) 0.25 MG tablet For anxiety 1  q day (Patient taking differently: For anxiety 1  q day. prn) 20 tablet 3   buPROPion  (WELLBUTRIN  XL) 300 MG 24 hr tablet Take 1 tablet (300 mg total) by mouth daily. 30 tablet 6   Calcium  Carbonate-Vitamin D  600-400 MG-UNIT per tablet Take 1 tablet by mouth daily.     dicyclomine  (BENTYL ) 20 MG tablet TAKE 1 TABLET BY MOUTH 3 TIMES DAILY AS NEEDED FOR SPASMS. 90 tablet 8   famotidine  (PEPCID ) 20 MG tablet Take 1 tablet (20 mg total) by mouth at bedtime as needed. For allergies 30 tablet 11   fluticasone  (FLONASE ) 50 MCG/ACT nasal spray INSTILL 2 SPRAYS IN EACH NOSTRIL ONCE A DAY. 16 g 10   losartan -hydrochlorothiazide  (HYZAAR) 50-12.5 MG tablet Take 1 tablet by mouth daily. 90 tablet 3   Multiple Vitamin (MULTIVITAMIN WITH MINERALS) TABS tablet Take 1 tablet by mouth daily.     MYRBETRIQ 50 MG TB24 tablet Take 50 mg by mouth daily.     pantoprazole  (PROTONIX ) 40 MG tablet Take 1 tablet (40 mg total) by mouth daily. TAKE 1 TABLET BY MOUTH TWICE A DAY BEFORE BREAKFAST AND DINNER 30 tablet 2   rosuvastatin  (CRESTOR ) 5 MG tablet TAKE 1  TABLET BY MOUTH DAILY 90 tablet 3   tolterodine (DETROL LA) 4 MG 24 hr capsule Take 4 mg by mouth at bedtime.     Vilazodone  HCl (VIIBRYD ) 40 MG TABS Take 1 tablet (40 mg total) by mouth daily. 30 tablet 7   No facility-administered medications prior to visit.    Allergies  Allergen Reactions   Molds & Smuts Other (See Comments)   Epinephrine     REACTION: tachycardia   Other     Tree And Shrub Pollen    Review of Systems  Constitutional:  Negative for chills, fever and malaise/fatigue.  Respiratory:  Negative for cough and shortness of breath.   Cardiovascular:  Negative for chest pain, palpitations and leg swelling.  Gastrointestinal:  Negative for abdominal pain, constipation, diarrhea, nausea and vomiting.  Genitourinary:  Negative for dysuria, frequency and urgency.  Musculoskeletal:  Negative for falls.  Neurological:  Negative for dizziness, focal weakness and headaches.       Objective:    Physical Exam Constitutional:      General: She is not in acute distress.    Appearance: She is not ill-appearing.  Eyes:     Extraocular Movements: Extraocular movements intact.     Conjunctiva/sclera: Conjunctivae normal.  Cardiovascular:     Rate and Rhythm: Normal rate.  Pulmonary:  Effort: Pulmonary effort is normal.  Musculoskeletal:        General: Normal range of motion.     Cervical back: Normal range of motion and neck supple.  Skin:    General: Skin is warm and dry.  Neurological:     General: No focal deficit present.     Mental Status: She is alert and oriented to person, place, and time.     Motor: No weakness.     Coordination: Coordination normal.     Gait: Gait normal.  Psychiatric:        Mood and Affect: Mood normal.        Behavior: Behavior normal.        Thought Content: Thought content normal.      BP 122/64   Pulse 63   Temp 97.6 F (36.4 C) (Temporal)   Ht 5' 1 (1.549 m)   Wt 186 lb (84.4 kg)   SpO2 95%   BMI 35.14 kg/m  Wt  Readings from Last 3 Encounters:  06/21/24 186 lb (84.4 kg)  06/15/24 183 lb 11.2 oz (83.3 kg)  06/08/24 183 lb (83 kg)       Assessment & Plan:   Problem List Items Addressed This Visit     Vitamin D  deficiency   Other Visit Diagnoses       Fracture Risk Assessment Score (FRAX) indicating greater than 3% risk for hip fracture    -  Primary     History of fragility fracture         Low bone mass           Assessment and Plan Assessment & Plan Osteopenia with high fracture risk Low bone mass with a significant change in bone density at the right femoral neck. FRAX score indicates a 4.3% risk for hip fracture, which is above the threshold for starting treatment. Previous fragility fracture over thirty years ago. Discussed treatment options including Fosamax  and Prolia. She prefers to resume Fosamax  as she tolerated it well previously without exacerbating acid reflux. - Prescribed Fosamax  (alendronate ) once weekly with instructions to take with a full glass of water on an empty stomach and remain upright for at least one hour. - Monitor for symptoms of acid reflux or dysphagia. - Will consider Prolia injections if Fosamax  is not tolerated or effective. - Scheduled follow-up in three months to assess treatment efficacy and side effects.  Vitamin D  deficiency Recent vitamin D  level was low. Currently taking 1000 IU of vitamin D  daily. - Continue vitamin D  supplementation at 1000 IU daily. - Will recheck vitamin D  levels in 3-4 months.  Gastroesophageal reflux disease Long-standing GERD managed with Protonix . Concerns about potential side effects of medication. She is attempting to reduce Protonix  to every other day. - Continue Protonix  with consideration of reducing frequency to every other day if tolerated. - Monitor for symptoms of acid reflux and adjust treatment as needed.     I am having Devere WENDI Louder start on alendronate . I am also having her maintain her Calcium   Carbonate-Vitamin D , acetaminophen , multivitamin with minerals, tolterodine, Myrbetriq, Vilazodone  HCl, ALPRAZolam , losartan -hydrochlorothiazide , buPROPion , pantoprazole , dicyclomine , famotidine , fluticasone , and rosuvastatin .  Meds ordered this encounter  Medications   alendronate  (FOSAMAX ) 70 MG tablet    Sig: Take 1 tablet (70 mg total) by mouth every 7 (seven) days. Take with a full glass of water on an empty stomach.    Dispense:  4 tablet    Refill:  11  Supervising Provider:   ROLLENE NORRIS A [[id number]]

## 2024-06-22 ENCOUNTER — Ambulatory Visit

## 2024-06-23 DIAGNOSIS — L814 Other melanin hyperpigmentation: Secondary | ICD-10-CM | POA: Diagnosis not present

## 2024-06-23 DIAGNOSIS — C44329 Squamous cell carcinoma of skin of other parts of face: Secondary | ICD-10-CM | POA: Diagnosis not present

## 2024-06-23 DIAGNOSIS — L578 Other skin changes due to chronic exposure to nonionizing radiation: Secondary | ICD-10-CM | POA: Diagnosis not present

## 2024-07-04 DIAGNOSIS — C441291 Squamous cell carcinoma of skin of left upper eyelid, including canthus: Secondary | ICD-10-CM | POA: Diagnosis not present

## 2024-07-04 DIAGNOSIS — C44329 Squamous cell carcinoma of skin of other parts of face: Secondary | ICD-10-CM | POA: Diagnosis not present

## 2024-07-05 ENCOUNTER — Telehealth: Payer: Self-pay

## 2024-07-05 NOTE — Telephone Encounter (Signed)
 Copied from CRM [id number]. Topic: Clinical - Medical Advice >> Jul 05, 2024  9:45 AM [name] wrote: Reason for CRM: Patient states message for PCP - Had an appt to see Dr. Paola tomorrow regarding a hernia but when the clinic reached out they advised that she would probably be better off being seen by a plastic surgeron.  Patient went ahead and cancelled appointment but she does not think insurance will pay for it if it is considered cosmetic. Would like to know PCP's thoughts as far as the kind of doctor she should see for this # [phone]

## 2024-07-05 NOTE — Telephone Encounter (Signed)
 Called pt and advised her of Vickie's recommendations, pt states she would like for Vickie to put a referral to general surgery for her to where she thinks is best as she used to know the doctors around but now that she is retired she lost contact so will go with what Vickie recommends.

## 2024-07-05 NOTE — Telephone Encounter (Signed)
 Called pt and provided her w number to [facility] where she was referred to. Advised her to let us  know if for some reason they need a new referral. Pt verbalized understanding

## 2024-07-07 ENCOUNTER — Other Ambulatory Visit: Payer: Self-pay | Admitting: Family Medicine

## 2024-07-07 ENCOUNTER — Encounter: Admitting: Pediatrics

## 2024-07-07 DIAGNOSIS — K469 Unspecified abdominal hernia without obstruction or gangrene: Secondary | ICD-10-CM

## 2024-07-07 NOTE — Telephone Encounter (Unsigned)
 Copied from CRM #[id number]. Topic: Referral - Question >> Jul 07, 2024  2:21 PM Lauren C wrote: Reason for CRM: Pt reached out to [facility] on referral for her hernia. They told her that no one in their practice does this kind of surgery and they suggested she see a engineer, petroleum. She is requesting the referral be transferred to whoever is Vickie's preference. She is confused why they say she needs a engineer, petroleum as well. Requesting the information be sent by either mychart or phone call.

## 2024-07-07 NOTE — Telephone Encounter (Signed)
 Called [facility] and they state they do not do diastasis surgery, therefore would need to go to plastic surgery.

## 2024-07-10 ENCOUNTER — Encounter: Attending: Physician Assistant | Admitting: Physician Assistant

## 2024-07-10 DIAGNOSIS — F32A Depression, unspecified: Secondary | ICD-10-CM | POA: Diagnosis not present

## 2024-07-10 DIAGNOSIS — I10 Essential (primary) hypertension: Secondary | ICD-10-CM | POA: Insufficient documentation

## 2024-07-10 DIAGNOSIS — S41112A Laceration without foreign body of left upper arm, initial encounter: Secondary | ICD-10-CM | POA: Diagnosis not present

## 2024-07-10 NOTE — Telephone Encounter (Signed)
 Called and informed pt of why she needed to be referred to plastics and informed of referral sent. Pt verbalized understanding and states she will let us  know if she doesn't hear from them by the end of the week to schedule.

## 2024-07-11 ENCOUNTER — Other Ambulatory Visit: Payer: Self-pay | Admitting: Family Medicine

## 2024-07-11 DIAGNOSIS — Z8719 Personal history of other diseases of the digestive system: Secondary | ICD-10-CM

## 2024-07-11 DIAGNOSIS — K469 Unspecified abdominal hernia without obstruction or gangrene: Secondary | ICD-10-CM

## 2024-07-11 NOTE — Telephone Encounter (Signed)
 Received message from plastic surgery office: We do not treat hernia that is general surgery in regards ti referral. I called [facility] and 1st spoke w referral coordinator who again relayed that they do not handle this type of surgery. I asked her how this is going to get handled then since each office is telling us  they do not handle this. She transferred me to their RN triage, Sari to help explain. I told Sari that I understand the reason, but what I don't understand is why we keep getting told each office does not handle this and to refer to the other. She explained that no recent imagining has been done since August 2022 when she saw Dr. Paola there and was diagnosed with a abdominal diastasis recti and since this would be cosmetic it needs to go to plastics. Because patient hasn't had any other imaging of it since 2022 that is what they are going off of. I asked if we changed the dx of the referral to plastics if that may change anything and she believed it would. She also is going to show the 2022 imaging to a surgeon there in office for a second opinion and once he has looked over it and given his opinion she will call me back w what they have to say and if it is recommended to be a plastic due to abdominal diastasis recti still.

## 2024-07-11 NOTE — Telephone Encounter (Signed)
 Dr. Cordella Idler looked over the imaging from 2022 and states he can see 3 hernias, but would like to have an updated CAT scan to see what they are like now before moving forward w her surgery. If we can place the order and once the imaging has been done, we can reach out to their office and let them know and they will contact pt to schedule.   Direct nursing line:  [phone], Sari

## 2024-07-12 NOTE — Telephone Encounter (Signed)
 Called pt and updated her on what general surgery said and imaging needed, pt verbalized understanding and will get it done when they call her

## 2024-07-13 ENCOUNTER — Other Ambulatory Visit: Payer: Self-pay | Admitting: Cardiology

## 2024-07-17 ENCOUNTER — Telehealth: Payer: Self-pay | Admitting: Cardiology

## 2024-07-17 MED ORDER — HYDROCHLOROTHIAZIDE 12.5 MG PO CAPS
12.5000 mg | ORAL_CAPSULE | Freq: Every day | ORAL | 3 refills | Status: AC
Start: 2024-07-17 — End: ?

## 2024-07-17 MED ORDER — LOSARTAN POTASSIUM 50 MG PO TABS: 50.0000 mg | ORAL_TABLET | Freq: Every day | ORAL | 3 refills | Status: AC

## 2024-07-17 NOTE — Telephone Encounter (Signed)
 Spoke with pharmacy.  They do not stock combo tablet of Losartan /hydrochlorothiazide  50-12.5 mg.  Prescriptions sent in separately.

## 2024-07-17 NOTE — Telephone Encounter (Signed)
 Pt c/o medication issue:  1. Name of Medication: losartan -hydrochlorothiazide  (HYZAAR) 50-12.5 MG tablet   2. How are you currently taking this medication (dosage and times per day)? Take 1 tablet by mouth daily   3. Are you having a reaction (difficulty breathing--STAT)? No   4. What is your medication issue? Pharmacy calling to make office aware they do not carry this medication as a combo, and fill it as two separate pills. Please advise.

## 2024-07-17 NOTE — Telephone Encounter (Signed)
*  STAT* If patient is at the pharmacy, call can be transferred to refill team.   1. Which medications need to be refilled? (please list name of each medication and dose if known)   losartan -hydrochlorothiazide  (HYZAAR) 50-12.5 MG tablet     2. Would you like to learn more about the convenience, safety, & potential cost savings by using the [facility]? No     3. Are you open to using the [facility] (Type [facility]. No    4. Which pharmacy/location (including street and city if local pharmacy) is medication to be sent to?[facility] - [place], [place] - [address]    5. Do they need a 30 day or 90 day supply? 90 day

## 2024-07-17 NOTE — Telephone Encounter (Signed)
 Spoke with pharmacy regarding Losartan -hydrochlorothiazide  50-12.5mg . Pharmacy stating they do not carry this combo drug and need it ordered separate. Sending to MD, nurse and Pharmacy for further assistance with this.

## 2024-07-18 NOTE — Telephone Encounter (Signed)
 Looks like this is a duplicate, see other phone note.

## 2024-07-19 ENCOUNTER — Ambulatory Visit
Admission: RE | Admit: 2024-07-19 | Discharge: 2024-07-19 | Disposition: A | Discharge status: Home / Self Care | Source: Ambulatory Visit | Attending: Family Medicine | Admitting: Family Medicine

## 2024-07-19 ENCOUNTER — Encounter: Admitting: Physician Assistant

## 2024-07-19 DIAGNOSIS — K469 Unspecified abdominal hernia without obstruction or gangrene: Secondary | ICD-10-CM

## 2024-07-19 DIAGNOSIS — Z8719 Personal history of other diseases of the digestive system: Secondary | ICD-10-CM

## 2024-07-19 MED ORDER — IOPAMIDOL (ISOVUE-300) INJECTION 61%
100.0000 mL | Freq: Once | INTRAVENOUS | Status: AC | PRN
  Administered 2024-07-19: 100 mL via INTRAVENOUS

## 2024-07-24 ENCOUNTER — Encounter: Admitting: Physician Assistant

## 2024-07-24 DIAGNOSIS — M6208 Separation of muscle (nontraumatic), other site: Secondary | ICD-10-CM | POA: Diagnosis not present

## 2024-07-24 DIAGNOSIS — K439 Ventral hernia without obstruction or gangrene: Secondary | ICD-10-CM | POA: Diagnosis not present

## 2024-07-28 ENCOUNTER — Ambulatory Visit: Admitting: Nurse Practitioner

## 2024-07-31 ENCOUNTER — Encounter: Attending: Internal Medicine | Admitting: Internal Medicine

## 2024-07-31 DIAGNOSIS — S41112A Laceration without foreign body of left upper arm, initial encounter: Secondary | ICD-10-CM | POA: Diagnosis present

## 2024-07-31 DIAGNOSIS — X58XXXA Exposure to other specified factors, initial encounter: Secondary | ICD-10-CM | POA: Diagnosis not present

## 2024-07-31 DIAGNOSIS — F32A Depression, unspecified: Secondary | ICD-10-CM | POA: Diagnosis not present

## 2024-07-31 DIAGNOSIS — Z09 Encounter for follow-up examination after completed treatment for conditions other than malignant neoplasm: Secondary | ICD-10-CM | POA: Diagnosis not present

## 2024-07-31 DIAGNOSIS — S41112D Laceration without foreign body of left upper arm, subsequent encounter: Secondary | ICD-10-CM | POA: Diagnosis not present

## 2024-07-31 DIAGNOSIS — I10 Essential (primary) hypertension: Secondary | ICD-10-CM | POA: Diagnosis not present

## 2024-08-01 ENCOUNTER — Ambulatory Visit: Payer: Self-pay | Admitting: Family Medicine

## 2024-08-01 ENCOUNTER — Encounter

## 2024-08-21 ENCOUNTER — Other Ambulatory Visit: Payer: Self-pay | Admitting: Gastroenterology

## 2024-08-21 DIAGNOSIS — K219 Gastro-esophageal reflux disease without esophagitis: Secondary | ICD-10-CM

## 2024-08-30 ENCOUNTER — Encounter

## 2024-09-19 ENCOUNTER — Encounter: Admitting: Pulmonary Disease

## 2024-09-21 ENCOUNTER — Ambulatory Visit: Admitting: Family Medicine

## 2024-10-12 ENCOUNTER — Encounter: Admitting: Pulmonary Disease

## 2024-11-07 ENCOUNTER — Encounter

## 2024-11-28 ENCOUNTER — Ambulatory Visit: Admitting: Family Medicine

## 2025-05-17 ENCOUNTER — Ambulatory Visit
